# Patient Record
Sex: Male | Born: 1965
Health system: Southern US, Community
[De-identification: ages and names within clinical notes are randomized; demographics above are authoritative.]

## PROBLEM LIST (undated history)

## (undated) DIAGNOSIS — I25118 Atherosclerotic heart disease of native coronary artery with other forms of angina pectoris: Secondary | ICD-10-CM

## (undated) DIAGNOSIS — K559 Vascular disorder of intestine, unspecified: Secondary | ICD-10-CM

## (undated) DIAGNOSIS — G40909 Epilepsy, unspecified, not intractable, without status epilepticus: Secondary | ICD-10-CM

## (undated) DIAGNOSIS — E785 Hyperlipidemia, unspecified: Secondary | ICD-10-CM

## (undated) DIAGNOSIS — K219 Gastro-esophageal reflux disease without esophagitis: Secondary | ICD-10-CM

## (undated) DIAGNOSIS — I10 Essential (primary) hypertension: Secondary | ICD-10-CM

## (undated) DIAGNOSIS — F17200 Nicotine dependence, unspecified, uncomplicated: Secondary | ICD-10-CM

## (undated) DIAGNOSIS — IMO0002 Reserved for concepts with insufficient information to code with codable children: Secondary | ICD-10-CM

## (undated) DIAGNOSIS — N400 Enlarged prostate without lower urinary tract symptoms: Secondary | ICD-10-CM

## (undated) DIAGNOSIS — G4730 Sleep apnea, unspecified: Secondary | ICD-10-CM

## (undated) DIAGNOSIS — M1990 Unspecified osteoarthritis, unspecified site: Secondary | ICD-10-CM

## (undated) DIAGNOSIS — R002 Palpitations: Secondary | ICD-10-CM

## (undated) DIAGNOSIS — G4733 Obstructive sleep apnea (adult) (pediatric): Secondary | ICD-10-CM

## (undated) DIAGNOSIS — R112 Nausea with vomiting, unspecified: Secondary | ICD-10-CM

## (undated) DIAGNOSIS — R079 Chest pain, unspecified: Secondary | ICD-10-CM

## (undated) DIAGNOSIS — I493 Ventricular premature depolarization: Secondary | ICD-10-CM

## (undated) DIAGNOSIS — R0683 Snoring: Secondary | ICD-10-CM

## (undated) DIAGNOSIS — I2510 Atherosclerotic heart disease of native coronary artery without angina pectoris: Secondary | ICD-10-CM

## (undated) DIAGNOSIS — N419 Inflammatory disease of prostate, unspecified: Secondary | ICD-10-CM

## (undated) DIAGNOSIS — I509 Heart failure, unspecified: Secondary | ICD-10-CM

## (undated) DIAGNOSIS — F419 Anxiety disorder, unspecified: Secondary | ICD-10-CM

## (undated) DIAGNOSIS — M549 Dorsalgia, unspecified: Secondary | ICD-10-CM

## (undated) DIAGNOSIS — R569 Unspecified convulsions: Secondary | ICD-10-CM

## (undated) DIAGNOSIS — F32A Depression, unspecified: Secondary | ICD-10-CM

## (undated) HISTORY — PX: COLONOSCOPY: SHX174

## (undated) HISTORY — PX: ELBOW SURGERY: SHX618

## (undated) HISTORY — PX: ANKLE SURGERY: SHX546

## (undated) HISTORY — DX: Vascular disorder of intestine, unspecified: K55.9

## (undated) HISTORY — PX: STENT PLACEMENT VASCULAR (ARMC HX): HXRAD1737

## (undated) HISTORY — DX: Anxiety disorder, unspecified: F41.9

## (undated) HISTORY — DX: Palpitations: R00.2

## (undated) HISTORY — DX: Inflammatory disease of prostate, unspecified: N41.9

## (undated) HISTORY — DX: Benign prostatic hyperplasia without lower urinary tract symptoms: N40.0

## (undated) HISTORY — DX: Ventricular premature depolarization: I49.3

## (undated) HISTORY — DX: Nicotine dependence, unspecified, uncomplicated: F17.200

## (undated) HISTORY — DX: Snoring: R06.83

## (undated) HISTORY — DX: Depression, unspecified: F32.A

## (undated) HISTORY — DX: Chest pain, unspecified: R07.9

## (undated) HISTORY — DX: Gastro-esophageal reflux disease without esophagitis: K21.9

## (undated) HISTORY — DX: Dorsalgia, unspecified: M54.9

## (undated) HISTORY — DX: Atherosclerotic heart disease of native coronary artery without angina pectoris: I25.10

## (undated) HISTORY — DX: Reserved for concepts with insufficient information to code with codable children: IMO0002

---
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
## 1999-06-17 DIAGNOSIS — S060X9A Concussion with loss of consciousness of unspecified duration, initial encounter: Secondary | ICD-10-CM

## 1999-06-17 HISTORY — DX: Concussion with loss of consciousness of unspecified duration, initial encounter: S06.0X9A

## 2008-03-06 DIAGNOSIS — N400 Enlarged prostate without lower urinary tract symptoms: Secondary | ICD-10-CM | POA: Insufficient documentation

## 2008-03-06 DIAGNOSIS — M4316 Spondylolisthesis, lumbar region: Secondary | ICD-10-CM | POA: Insufficient documentation

## 2008-03-06 DIAGNOSIS — F411 Generalized anxiety disorder: Secondary | ICD-10-CM | POA: Insufficient documentation

## 2008-03-06 DIAGNOSIS — F3289 Other specified depressive episodes: Secondary | ICD-10-CM | POA: Insufficient documentation

## 2008-04-19 ENCOUNTER — Encounter: Payer: Self-pay | Admitting: Gastroenterology

## 2009-04-17 ENCOUNTER — Ambulatory Visit: Payer: Self-pay | Admitting: Primary Care

## 2009-05-07 ENCOUNTER — Ambulatory Visit: Payer: Self-pay | Admitting: Primary Care

## 2009-09-06 ENCOUNTER — Encounter: Payer: Self-pay | Admitting: Gastroenterology

## 2010-03-12 ENCOUNTER — Ambulatory Visit
Admit: 2010-03-12 | Discharge: 2010-03-12 | Disposition: A | Discharge status: Home / Self Care | Payer: Self-pay | Source: Ambulatory Visit | Attending: Primary Care | Admitting: Primary Care

## 2010-03-12 ENCOUNTER — Ambulatory Visit: Payer: Self-pay | Admitting: Primary Care

## 2010-03-12 LAB — TESTOSTERONE BY IMMUNOASSAY (ADULT MALES OR INDIVIDUALS ON TESTOSTERONE HORMONE THERAPY): Testosterone: 492 ng/dL (ref 249–836)

## 2010-03-12 LAB — HM HIV SCREENING OFFERED

## 2010-03-12 LAB — LIPID PANEL
Chol/HDL Ratio: 6
Cholesterol: 211 mg/dL — AB
HDL: 35 mg/dL
LDL Calculated: 127 mg/dL
Non HDL Cholesterol: 176 mg/dL
Triglycerides: 244 mg/dL — AB

## 2010-03-12 LAB — COMPREHENSIVE METABOLIC PANEL
ALT: 26 U/L (ref 0–50)
AST: 28 U/L (ref 0–50)
Albumin: 5 g/dL (ref 3.5–5.2)
Alk Phos: 84 U/L (ref 40–130)
Anion Gap: 9 (ref 7–16)
Bilirubin,Total: 0.3 mg/dL (ref 0.0–1.2)
CO2: 27 mmol/L (ref 20–28)
Calcium: 9.4 mg/dL (ref 8.6–10.2)
Chloride: 102 mmol/L (ref 96–108)
Creatinine: 0.78 mg/dL (ref 0.67–1.17)
GFR,Black: >59 *
GFR,Caucasian: >59 *
Glucose: 99 mg/dL (ref 74–106)
Lab: 11 mg/dL (ref 6–20)
Potassium: 4.7 mmol/L (ref 3.3–5.1)
Sodium: 138 mmol/L (ref 133–145)
Total Protein: 7.5 g/dL (ref 6.3–7.7)

## 2010-03-17 NOTE — Progress Notes (Signed)
 Reason For Visit   C/O chest congestion, cough with green sputum. MBabcock, LPN.  HPI   [name] returns to the office for followup of his   hypercholesterolemia and depressive disorder as well as with complaints of   a productive cough for the past week and with a reviewed interest in   maximizing his efforts on smoking cessation.     He continues with his usual medications. He notes that he was making some   good progress with the use of the Nicotrol inhaler last fall until he got   distracted by some stressful issues going on in his life and simply   returned to his smoking habit. He would like to reinitiate the Nicotrol at   this time because he believes that it was helping him and he would like to   give it another try. He continues to smoke 1 ppd of cigarettes.     Along with his recent cough he has had a stuffy nose, runny nose, maxillary   sinus pressure, postnasal drip, scratchy throat, mild wheezing, a little   shortness of breath, loose stools and some sweats. He denies any headaches,   dizziness, ear symptoms, frontal sinus symptoms, heartburn, nausea,   vomiting, fevers or chills. He has taken Mucinex and guaifenesin for his   symptoms with some temporary benefit.     He is doing his best to watch his eating habits and stay active although he   does not have a regular exercise routine.     His mood remains good with the use of the sertraline and he is satisfied to   continue the same. He is sleeping well, his energy level is good, his   appetite is good and he is finding enjoyment in his life. He denies any   problems with concentration, coordination, feelings of guilt or any   suicidal or homicidal ideation.  Allergies   Latex  No Known Drug Allergy.  Current Meds   ** Medication reconciliation completed and patient declined printed list.   **.  Nicotrol 10 MG Inhaler;May use as much as 2 inhalers per day; Rx  ProAir HFA 108 (90 Base) MCG/ACT Aerosol Solution;INHALE 1-2 PUFFS EVERY   4-6 HOURS AS  NEEDED AND AS DIRECTED.; Rx  Sertraline HCl 100 MG Tablet;TAKE 1 TABLET BY MOUTH ONCE DAILY; Rx.  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  Personal Hx   No Alcohol Use  Caffeine Use; 8-10 X A DAY  No Drug Use  Smoking (V15.82); 1PPD SINCE 1987.  Vital Signs   Recorded by [facility] on 12 Mar 2010 09:25 AM  BP:124/80,   HR: 75 b/min,   Temp: 98.3 F,   Height: 71.75 in, Weight: 177 lb, BMI: 24.2 kg/m2,   O2 Sat: 97 (%SpO2).  Physical Exam   General: Alert, appropriate, pleasant man in no apparent distress.  HEENT: Sclera and conjunctiva clear, TMs WNL. Nasal mucosa with mild   erythema, edema and mucous congestion. Mild peritonsillar erythema but no   tonsillar enlargement or exudates. Mild bilateral maxillary sinus   tenderness. No frontal sinus tenderness.  Neck: Supple, no lymphadenopathy, no thyromegaly, 2+ carotid pulses   bilaterally.  Heart: Regular rate and rhythm, no murmur.  Lungs: Coarse upper airway sounds are heard centrally with mild rhonchi. No   wheezes or rales.  Abdomen: Positive bowel sounds, soft, nontender, nondistended, no masses,  no organomegaly.  Extremities: 2+ radial and 2+ posterior tibial pulses bilaterally.  No   clubbing, cyanosis or edema.  Psych: Neurovegetative signs and symptoms as described above.  Results   Blood work obtained on 04/21/09 identified a fasting glucose of 93,   creatinine 0.6, electrolytes and LFTs WNL. Total cholesterol 257, HDL 37,   LDL 191 and triglycerides 811.  Assessment   1. BRONCHITIS  2. HYPERCHOLESTEROLEMIA -- untreated and uncontrolled  --According to ATP III guidelines      LDL above goal ; discussed goal with patient. Based on risk profile and   co-morbidities LDL goal is 100.      HDL below goal; discussed goal with patient. Based on risk profile and   co-morbidities HDL goal is 40.      Triglyceride at goal. ; discussed goal with patient.  Based on risk   profile and co-morbidities Triglyceride goal is 150.  Plan to reach goal includes:  --Lifestyle Modifications: weight reduction; discussed low cholesterol and   saturated fat diet; discussed low carbohydrate diet; discussed aerobic   physical activity       --Following our conversation the patient is willing to make necessary   changes YES       --Self-management tool provided YES   --Medication Management: no changes made ; discussed low fat diet    --Referral for Care Management:  no  --Follow up in 2 weeks  3. SMOKING CESSATION COUNSELING -- patient expresses a renewed desire to   quit  4. DEPRESSIVE DISORDER -- effectively managed.  Plan   1. Initiate amoxicillin 500 mg, 2 tablets twice daily for 10 days.  2. Recommended OTCs, drink adequate fluids, vitamins, rest, salt water   gargles and vaporizer at bedside at night as needed for management of   bronchitis symptoms.  3. Reinitiate Nicotrol, 10 mg/inhaler, may use as much as 2 inhalers per   day.  4. Commended patient on his renewed interest in maximizing his efforts on   smoking cessation and urged him to remain committed to this plan.  5. Refill sertraline 100 mg once daily.  6. Counseled patient regarding prudent diet, exercise and weight   management. Urged him to increase his efforts in this regard.  7. RTO in 2 weeks for followup of hypercholesterolemia or sooner if any   other problems or concerns.  8. Complete blood work prior to the next appointment to check an FLP, CMP   and testosterone level.  Signature   Electronically signed by: Janeal Holmes  M.D.; 03/17/2010 10:33 PM EST.

## 2010-03-26 ENCOUNTER — Ambulatory Visit: Payer: Self-pay | Admitting: Primary Care

## 2010-03-27 ENCOUNTER — Ambulatory Visit: Payer: Self-pay | Admitting: Primary Care

## 2010-04-04 NOTE — Progress Notes (Signed)
 Reason For Visit   F/U and review labs. MBabcock, LPN.  HPI   [name] returns to the office for followup of his   hypercholesterolemia, anxiety disorder and efforts on smoking cessation.     He did reinitiate the Nicotrol inhaler as we had discussed at his last   appointment. He is pleased to report that he has succeeded in reducing his   consumption of cigarettes to 8-10 per day with the use of this medication.   He is pleased with his progress and is hoping that he will be able to end   this habit entirely within the next month or 2.     He otherwise continues with his usual medications. He denies any problems   with headaches, dizziness, vision changes, chest pain, palpitations,   shortness of breath, heartburn, nausea, diarrhea, constipation or fatigue.   He is doing his best to watch his eating habits and stay active but admits   that he tends to be inconsistent with his efforts. He will do well for a   while and then slip back into less productive habits.     His mood remains good with the use of the sertraline. He is sleeping well,   his energy level is good, his appetite is good and he is finding enjoyment   in his life. He denies any problems with concentration, coordination,   feelings of guilt or any suicidal or homicidal ideation. He feels more   relaxed with the use of this medication and is able to manage stress much   more effectively.  Allergies   Latex  No Known Drug Allergy.  Current Meds   ** Medication reconciliation completed and patient declined printed list.   **.  Sertraline HCl 100 MG Tablet;TAKE 1 TABLET BY MOUTH ONCE DAILY; Rx  ProAir HFA 108 (90 Base) MCG/ACT Aerosol Solution;INHALE 1-2 PUFFS EVERY   4-6 HOURS AS NEEDED AND AS DIRECTED.; Rx  Nicotrol 10 MG Inhaler;May use as much as 2 inhalers per day; Rx.  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  Personal Hx   No Alcohol  Use  Caffeine Use; 8-10 X A DAY  No Drug Use  Smoking (V15.82); 1PPD SINCE 1987.  Vital Signs   Recorded by Neomia Dear on 27 Mar 2010 04:16 PM  BP:104/72,   HR: 76 b/min,   Weight: 182 lb,   O2 Sat: 96 (%SpO2).  Physical Exam   General: Alert, appropriate, pleasant man in no apparent distress.  Neck: Supple, no lymphadenopathy, no thyromegaly, 2+ carotid pulses   bilaterally.  Heart: Regular rate and rhythm, no murmur.  Lungs: Clear to auscultation bilaterally.  Abdomen: Positive bowel sounds, soft, nontender, nondistended, no masses,   no organomegaly.  Extremities: 2+ radial and 2+ posterior tibial pulses bilaterally.  No   clubbing, cyanosis or edema.  Psych: Neurovegetative signs and symptoms as described above.  Results   COMPREHENSIVE METABOLIC PROF - CMP   12 Mar 2010 10:32 AM  -   SODIUM: 138 mmol/L  -   POTASSIUM: 4.7 mmol/L  -   CHLORIDE: 102 mmol/L  -   CO2: 27 mmol/L  -   ANION GAP: 9   -   UREA NITROGEN: 11 mg/dL  -   CREATININE: 0.98 mg/dL  -   GFR,CAUCASIAN: > 59  -   GFR,BLACK: > 59  -   GLUCOSE: 99 mg/dL  -  CALCIUM: 9.4 mg/dL  -   TOTAL PROTEIN: 7.5 g/dl  -   ALBUMIN: 5.0 g/dl  -   ALKALINE PHOSPHATASE: 84 u/l  -   T BILI: 0.3 mg/dL  -   AST: 28 u/l  -   ALT: 26 u/l  LIPID PROFILE - LIPID   12 Mar 2010 10:32 AM  -   CHOLESTEROL: 211 mg/dL  -   TRIGLYCERIDES: 914 mg/dL  -   HDL: 35 mg/dL  -   LDL (CALC): 782 mg/dL  -   CHOL/HDL RATIO: 6.0  -   NON HDL CHOLESTEROL: 176 mg/dL  TESTOSTERONE - TESTO   12 Mar 2010 10:32 AM  -   TESTOSTERONE: 492 ng/dl.  Assessment   1. Hypercholesterolemia -- not adequately controlled  2. Anxiety disorder -- satisfactory management  3. Smoking cessation counseling -- good progress on reducing cigarette   consumption.  Plan   1. Initiate lovastatin 40 mg once daily.  2. Counseled patient regarding prudent diet, exercise and weight management.  3. Continue with current medications at their current doses.  4. Commended patient on his progress with efforts on smoking  cessation and   urged him to remain committed to this plan.  5. RTO in 3 months for followup of the above problems or sooner if any   other problems or concerns.  6. Complete blood work prior to the next appointment to check an FLP, AST   and ALT.  Signature   Electronically signed by: [name]  M.D.; 04/04/2010 8:34 PM EST.

## 2010-05-20 ENCOUNTER — Ambulatory Visit: Payer: Self-pay | Admitting: Primary Care

## 2010-05-23 NOTE — Progress Notes (Signed)
 Reason For Visit   C/O right elbow injury, pt states that bowling last night he heard a pop.   Pain from elbow up arm and shoulder.   --PAIN: Patient  acknowledges pain in the last week.       --If yes, 0-10 pain rating: 9        --Duration of pain: 1 day        --Aggravating factors: bending, pressure        --Relieving factors: not using arm  MBabcock, LPN.  HPI   [name] presents to the office with complaints of right elbow pain   following an injury that occurred at approximately 8:00 p.m. last evening.     He is currently participating in a bowling league with his wife,   mother-in-law and father-in-law. He was in the middle of his third game   last evening when he suddenly heard and felt a popping sensation in his   right elbow as he threw the ball down the alley. He experienced immediate   pain but there was no swelling. He continued to finish out his game   although his performance was stunted by the lingering pain and he had to   use his right arm more cautiously.     The pain has been present continuously since that time. He has still not   noticed any swelling. He is aware of some mild waxing and waning dullness   and tingling in his right forearm and hand. He denies any pain in his neck,   back, shoulders, left elbow or wrists. He denies any dullness or tingling   in his left forearm or hand. He denies any weakness in his arms or hands.   He has not noticed any further clicking, popping, locking, grinding or   instability with passive range of motion of his right elbow.     Pain is worse with grasping, pulling and lifting with his right hand and   arm. Symptoms improve somewhat with rest. He has tried using cold packs and   Advil with modest temporary benefit.     He has never injured his elbow in the past.  Allergies   Latex  No Known Drug Allergy.  Current Meds   ** Medication reconciliation completed and patient declined printed list.   **.  Sertraline HCl 100 MG Tablet;TAKE 1 TABLET BY MOUTH  ONCE DAILY; Rx  ProAir HFA 108 (90 Base) MCG/ACT Aerosol Solution;INHALE 1-2 PUFFS EVERY   4-6 HOURS AS NEEDED AND AS DIRECTED.; Rx  Nicotrol 10 MG Inhaler;May use as much as 2 inhalers per day; Rx  Lovastatin 40 MG Tablet;TAKE 1 TABLET DAILY.; Rx.  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  Personal Hx   No Alcohol Use  Caffeine Use; 8-10 X A DAY  No Drug Use  Smoking (V15.82); 1PPD SINCE 1987.  Vital Signs   Recorded by Neomia Dear on 20 May 2010 03:58 PM  BP:112/80,   HR: 77 b/min,   O2 Sat: 96 (%SpO2).  Physical Exam   General: Alert, appropriate, pleasant man in no apparent distress.  Neck: Supple, no lymphadenopathy, no thyromegaly, 2+ carotid pulses   bilaterally.  Heart: Regular rate and rhythm, no murmur.  Lungs: Clear to auscultation bilaterally.  Abdomen: Positive bowel sounds, soft, nontender, nondistended, no masses,   no organomegaly.  Extremities: 2+ radial and 2+ posterior tibial pulses bilaterally.  No  clubbing, cyanosis or edema.  MSK: Patient has mild tenderness on the anterior and dorsal surfaces of the   right elbow. There is tenderness surrounding the olecranon as well as in   the antecubital fossa. Mild discomfort with range of motion of the right   elbow but no crepitance or instability. There is no evidence of erythema,   edema, induration, warmth or ecchymosis.  Neuro: Patient reports dull sensation along the ulnar surface of the right   forearm as well as the 4th and 5th digits of the right hand. No sensory   deficits of the upper extremities are identified otherwise. No motor   deficits. Brisk, uniform biceps and brachioradialis tendon reflexes   bilaterally. 5/5 muscle strength in the flexors and extensors of the   shoulders, elbows, wrists and hands bilaterally.  Assessment   Right elbow sprain.  Plan   1. Refer for stat x-rays of the right elbow to assess for evidence of    fracture or dislocation. Will notify patient of the results later today and   advise him appropriately.  2. Recommended heat, cold, rest and a soft elbow support as needed for   management of symptoms.  3. Avoid stress or strain to the right elbow while symptoms persist.  4. RTO as needed if symptoms persist or worsen.  Signature   Electronically signed by: [name]  M.D.; 05/23/2010 10:03 PM EST.

## 2010-06-16 DIAGNOSIS — I469 Cardiac arrest, cause unspecified: Secondary | ICD-10-CM

## 2010-06-16 HISTORY — DX: Cardiac arrest, cause unspecified: I46.9

## 2010-06-19 ENCOUNTER — Ambulatory Visit: Payer: Self-pay | Admitting: Primary Care

## 2010-06-24 NOTE — Miscellaneous (Unsigned)
 Continuity of Care Record  Created: todo  From: EMERSON, DREW  From:   From: [employer], EHR v10.2.7.53  To: Finis Bud  Purpose: Patient Use;       Problems  Diagnosis: Anxiety Disorder NOS (300.00)   Diagnosis: Benign Prostatic Hypertrophy (600.00)   Diagnosis: Depression (311)   Diagnosis: Discogenic Syndrome (722.2)     Social History  Caffeine Use  Smoking (V15.82)   No History of Alcohol Use  No History of Drug Use    Alerts  Allergy - Latex   Allergy - No Known Drug Allergy     Medications  ALPRAZolam 1 MG Tablet; Take tab 1 hour prior to test ; Rx   Lovastatin 40 MG Tablet; TAKE 1 TABLET DAILY. ; Rx   Nicotrol 10 MG Inhaler; May use as much as 2 inhalers per day ; Rx   ProAir HFA 108 (90 Base) MCG/ACT Aerosol Solution; INHALE 1-2 PUFFS EVERY   4-6 HOURS AS NEEDED AND AS DIRECTED. ; Rx   Sertraline HCl 100 MG Tablet; TAKE 1 TABLET DAILY. ; Rx     Immunizations  Td   Influenza   Influenza   H1N1 Influenza Inj   Influenza   Influenza

## 2010-06-29 ENCOUNTER — Ambulatory Visit
Admit: 2010-06-29 | Discharge: 2010-06-29 | Disposition: A | Discharge status: Home / Self Care | Payer: Self-pay | Source: Ambulatory Visit | Attending: Primary Care | Admitting: Primary Care

## 2010-06-29 LAB — LIPID PANEL
Chol/HDL Ratio: 3.5
Cholesterol: 144 mg/dL
HDL: 41 mg/dL
LDL Calculated: 85 mg/dL
Non HDL Cholesterol: 103 mg/dL
Triglycerides: 90 mg/dL

## 2010-06-29 LAB — ALT: ALT: 27 U/L (ref 0–50)

## 2010-06-29 LAB — AST: AST: 26 U/L (ref 0–50)

## 2010-07-01 ENCOUNTER — Encounter: Payer: Self-pay | Admitting: Primary Care

## 2010-07-01 ENCOUNTER — Ambulatory Visit: Payer: Self-pay | Admitting: Primary Care

## 2010-07-04 NOTE — Progress Notes (Signed)
Reason For Visit   F/U cholesterol and review labs. MBabcock, LPN.  HPI   [name] returns to the office for followup of his   hypercholesterolemia as well as with complaints of recurrent low back pain   for the past 4-5 months, burning, dullness and tingling in his left leg for   the past 2-3 months and weakness in both of his legs for the past 2-3   months as well.     He did initiate the lovastatin as we had discussed in October. He otherwise   continues with his usual medications. He denies any problems with   headaches, dizziness, vision changes, chest pain, palpitations, shortness   of breath, heartburn, nausea, diarrhea, constipation or fatigue. He is   doing his best to watch his eating habits and stay active although he does   not have a regular exercise routine. He continues to be a 1/2 ppd smoker.     He has had recurrent problems with low back pain for several years. He   denies any recent strain or injury to his low back. He has not undertaken   any heavy lifting recently and does not have an exercise routine. The pain   in his low back radiates to his left buttock and thigh. He has been aware   of a burning sensation as well as dullness and tingling in his left thigh   and calf. He denies any pain in his hips, knees or ankles.     He has also noticed that his legs have felt a little weak and sluggish   during the past few months. He it is most aware of this when he climbs a   flight of stairs. He cannot recall ever having had symptoms of this nature   in the past. He denies any associated pain, swelling, warmth or   discoloration.  Allergies   Latex  No Known Drug Allergy.  Current Meds   ** Medication reconciliation completed and patient declined printed list.   **.  ProAir HFA 108 (90 Base) MCG/ACT Aerosol Solution;INHALE 1-2 PUFFS EVERY   4-6 HOURS AS NEEDED AND AS DIRECTED.; Rx  Nicotrol 10 MG Inhaler;May use as much as 2 inhalers per day; Rx  Lovastatin 40 MG Tablet;TAKE 1 TABLET DAILY.;  Rx  Sertraline HCl 100 MG Tablet;TAKE 1 TABLET DAILY.; Rx.  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  Vital Signs   Recorded by Neomia Dear on 01 Jul 2010 09:08 AM  BP:118/74,   Weight: 183 lb.  Physical Exam   General: Alert, appropriate, pleasant man in no apparent distress.  Neck: Supple, no lymphadenopathy, no thyromegaly, 2+ carotid pulses   bilaterally.  Heart: Regular rate and rhythm, no murmur.  Lungs: Clear to auscultation bilaterally.  Abdomen: Positive bowel sounds, soft, nontender, nondistended, no masses,   no organomegaly.  Extremities: 2+ radial and 2+ posterior tibial pulses bilaterally.  No   clubbing, cyanosis, erythema, ecchymosis, warmth, pallor or edema.  MSK: Mild tenderness over the perispinous muscles of the lumbar spine, L >   R. Forward flexion of the back to 90 degrees from vertical and extension to   10 degrees. Lateral flexion to 30 degrees and shoulder rotation to 90   degrees bilaterally.  Neuro: Patient reports dull sensation on the anterolateral surfaces of the   left thigh. No sensory or motor deficits are identified in the lower  extremities otherwise. Brisk, uniform patellar tendon and Achilles tendon   reflexes bilaterally. 5/5 muscle strength in the flexors and extensors of   the hips, knees and ankles bilaterally.  Results   LIPID PROFILE - LIPID   29 Jun 2010 09:32 AM  -   CHOLESTEROL: 144 mg/dL  -   TRIGLYCERIDES: 90 mg/dL  -   HDL: 41 mg/dL  -   LDL (CALC): 85 mg/dL  -   CHOL/HDL RATIO: 3.5   -   NON HDL CHOLESTEROL: 103 mg/dL  ALT - ALT   29 Jun 2010 09:32 AM  -   ALT: 27 u/l  AST - AST   29 Jun 2010 09:32 AM  -   AST: 26 u/l.  Assessment   1. Hypercholesterolemia -- well-controlled  2. Chronic low back pain -- recurrent symptoms  3. Left lower extremity radiculopathy  4. Myalgias.  Plan   1. Hold on the use of lovastatin for now given the weakness in the legs. It    is possible that this medication may be the reason for this and/or the low   back pain as well.  2. Refer for x-rays of the lumbar spine to assess for evidence of   degenerative disc disease, spondylosis and spondylolisthesis. Will notify   patient of the results when they are received and advise him appropriately.  3. Refer to pain specialist, Dr. Riccardo Dubin for consideration of facet   injections or an epidural injection to help alleviate the patient's low   back symptoms.  4. May use heat, cold, Tylenol, Advil and gentle stretching exercises as   needed for management of pain.  5. Avoid stress or strain to the low back or legs while symptoms persist.  6. RTO in 3 months for followup of hypercholesterolemia or sooner if any   other problems or concerns.  7. Complete blood work prior to the next appointment to check an FLP, AST   and ALT.  Signature   Electronically signed by: Janeal Holmes  M.D.; 07/04/2010 6:31 PM EST.

## 2010-08-08 ENCOUNTER — Ambulatory Visit: Admit: 2010-08-08 | Payer: Self-pay | Source: Ambulatory Visit | Admitting: Pain Medicine

## 2010-09-17 ENCOUNTER — Ambulatory Visit: Payer: Self-pay | Admitting: Primary Care

## 2010-09-17 ENCOUNTER — Encounter: Payer: Self-pay | Admitting: Primary Care

## 2010-09-17 LAB — COMPREHENSIVE METABOLIC PANEL
ALT: 27 U/L (ref 0–50)
AST: 25 U/L (ref 0–50)
Albumin: 5.4 g/dL — ABNORMAL HIGH (ref 3.5–5.2)
Alk Phos: 73 U/L (ref 40–130)
Anion Gap: 9 (ref 7–16)
Bilirubin,Total: 0.2 mg/dL (ref 0.0–1.2)
CO2: 28 mmol/L (ref 20–28)
Calcium: 9.5 mg/dL (ref 8.6–10.2)
Chloride: 104 mmol/L (ref 96–108)
Creatinine: 0.77 mg/dL (ref 0.67–1.17)
GFR,Black: >59 *
GFR,Caucasian: >59 *
Glucose: 73 mg/dL — ABNORMAL LOW (ref 74–106)
Lab: 15 mg/dL (ref 6–20)
Potassium: 4.4 mmol/L (ref 3.3–5.1)
Sodium: 141 mmol/L (ref 133–145)
Total Protein: 7.5 g/dL (ref 6.3–7.7)

## 2010-09-17 LAB — CBC AND DIFFERENTIAL
Baso # K/uL: 0 10*3/uL (ref 0.0–0.1)
Basophil %: 0.1 % — ABNORMAL LOW (ref 0.2–1.2)
Eos # K/uL: 0.1 10*3/uL (ref 0.0–0.5)
Eosinophil %: 1 % (ref 0.8–7.0)
Hematocrit: 44 % (ref 40–51)
Hemoglobin: 15.2 g/dL (ref 13.7–17.5)
Lymph # K/uL: 1.8 10*3/uL (ref 1.3–3.6)
Lymphocyte %: 25.7 % (ref 21.8–53.1)
MCV: 94 fL — ABNORMAL HIGH (ref 79–92)
Mono # K/uL: 0.6 10*3/uL (ref 0.3–0.8)
Monocyte %: 8.5 % (ref 5.3–12.2)
Neut # K/uL: 4.4 10*3/uL (ref 1.8–5.4)
Platelets: 302 10*3/uL (ref 150–330)
RBC: 4.7 MIL/uL (ref 4.6–6.1)
RDW: 13.4 % (ref 11.6–14.4)
Seg Neut %: 64.7 % (ref 34.0–67.9)
WBC: 6.8 10*3/uL (ref 4.2–9.1)

## 2010-09-17 LAB — SEDIMENTATION RATE, AUTOMATED: Sedimentation Rate: 16 mm/hr — ABNORMAL HIGH (ref 0–15)

## 2010-09-17 LAB — TESTOSTERONE: Testosterone: 528 ng/dL (ref 249–836)

## 2010-09-17 LAB — TIBC
Iron: 85 ug/dL (ref 45–170)
TIBC: 333 ug/dL (ref 250–450)
Transferrin Saturation: 26 % (ref 20–55)

## 2010-09-17 LAB — MAGNESIUM: Magnesium: 1.8 mEq/L (ref 1.3–2.1)

## 2010-09-17 LAB — T4, FREE: Free T4: 1 ng/dL (ref 0.9–1.7)

## 2010-09-17 LAB — TSH: TSH: 3.54 u[IU]/mL (ref 0.27–4.20)

## 2010-09-17 LAB — LACTATE DEHYDROGENASE: LD: 198 U/L (ref 118–225)

## 2010-09-17 LAB — VITAMIN B12: Vitamin B12: 290 pg/mL (ref 211–946)

## 2010-09-18 LAB — LYME IGG/IGM AB: Lyme AB Screen: NEGATIVE

## 2010-09-18 LAB — SYPHILIS SCREEN
Syphilis Screen: NEGATIVE
Syphilis Status: NONREACTIVE

## 2010-09-19 LAB — VITAMIN D
25-OH VIT D2: <4 ng/mL
25-OH VIT D3: 27 ng/mL
25-OH Vit Total: 27 ng/mL — ABNORMAL LOW (ref 30–80)

## 2010-09-24 ENCOUNTER — Other Ambulatory Visit: Payer: Self-pay | Admitting: Primary Care

## 2010-09-30 ENCOUNTER — Ambulatory Visit: Payer: Self-pay | Admitting: Primary Care

## 2010-09-30 NOTE — Progress Notes (Signed)
 Reason For Visit   C/O bilateral arm weakness, tingling sensations to feet and hands, fatigue   for about 2 weeks. MBabcock, LPN.  HPI   [name] presents to the office with complaints of headaches, fatigue   and tingling sensations in his hands and feet that have been waxing and   waning for the past 2 weeks.     He has no idea what may have triggered these symptoms and cannot recall   having had symptoms of this nature in the past. He denies any recent   illness including any dizziness, vision changes, stuffy nose, runny nose,   ear symptoms, sinus symptoms, postnasal drip, sore throat, cough, wheezing,   shortness of breath, heartburn, nausea, diarrhea, constipation, fevers,   chills or sweats.     He has not initiated any new medications within the past couple of months.   He has not eaten anything out of the ordinary wart on any recent traveling.   He denies any personal or family history of neurologic disorders. He denies   any history of STDs or tick bites. He does note problems with erectile   dysfunction during the past year.     He also notes a feeling of weakness in his arms during the past 2 weeks as   well. He does not seem to have the strength that he recalls having had as   recent as a few months ago. He does not seem to have the level of energy   and motivation he had recently either.     He continues with his usual medications.  Allergies   Latex  No Known Drug Allergy.  Current Meds   ** Medication reconciliation completed and patient declined printed list.   **.  Lovastatin 40 MG Tablet;TAKE 1 TABLET DAILY.; Rx  Sertraline HCl 100 MG Tablet;TAKE 1 TABLET DAILY.; Rx  Nicotrol 10 MG Inhaler;May use as much as 2 inhalers per day; Rx  ProAir HFA 108 (90 Base) MCG/ACT Aerosol Solution;INHALE 1-2 PUFFS EVERY   4-6 HOURS AS NEEDED AND AS DIRECTED.; Rx.  Active Problems   Anxiety Disorder NOS (300.00)  Benign Prostatic Hypertrophy (600.00)  Depression (311)  Discogenic Syndrome (722.2); LUMBAR  SPINE.  Personal Hx   No Alcohol Use  Caffeine Use; 8-10 X A DAY  No Drug Use  Smoking (V15.82); 1PPD SINCE 1987.  Vital Signs   Recorded by [facility] on 17 Sep 2010 02:30 PM  BP:122/82,   Weight: 182 lb.  Physical Exam   General: Alert, appropriate, pleasant man in no apparent distress.  HEENT: PERRLA, EOMI, sclera and conjunctiva clear, TMs WNL, MMM, oropharynx   negative. No frontal or maxillary sinus tenderness.  Neck: Supple, no lymphadenopathy, no thyromegaly, 2+ carotid pulses   bilaterally.  Heart: Regular rate and rhythm, no murmur.  Lungs: Clear to auscultation bilaterally.  Abdomen: Positive bowel sounds, soft, nontender, nondistended, no masses,   no organomegaly.  Extremities: 2+ radial and 2+ posterior tibial pulses bilaterally.  No   clubbing, cyanosis or edema.  Neuro: CN II-XII intact.  No sensory or motor deficits.  Brisk, uniform   upper and lower extremity reflexes bilaterally.  5/5 muscle strength in the   flexors and extensors of the shoulders, elbows, hips and knees bilaterally.    Romberg test negative.  Assessment   1. Headaches  2. Paresthesias  3. Fatigue.  Plan   1. Refer for a head MRI to assess for evidence of multiple sclerosis,   lacunar  infarcts or other CNS lesions.  2. Complete blood work to check a CMP, CBC diff, ESR, iron, TIBC, TSH, free   T4, testosterone, vitamin B12, LDH, RPR and Lyme titer.  3. Refer to neurologist, Dr. Knox Saliva for further evaluation and management   of the problems above.  4. Continue with current medications at their current doses.  5. RTO in 1 month for followup of the above problems or sooner if any other   problems or concerns.  Signature   Electronically signed by: [name]  M.D.; 09/30/2010 9:16 PM EST.

## 2010-10-01 ENCOUNTER — Ambulatory Visit
Admit: 2010-10-01 | Discharge: 2010-10-01 | Disposition: A | Discharge status: Home / Self Care | Payer: Self-pay | Source: Ambulatory Visit | Attending: Neurology | Admitting: Neurology

## 2010-10-01 LAB — SEDIMENTATION RATE, AUTOMATED: Sedimentation Rate: 13 mm/hr (ref 0–15)

## 2010-10-01 LAB — CK: CK: 203 U/L — ABNORMAL HIGH (ref 46–171)

## 2010-10-01 LAB — FOLATE: Folate: >20 ng/mL (ref >=4.6)

## 2010-10-01 LAB — VITAMIN B12: Vitamin B12: 313 pg/mL (ref 211–946)

## 2010-10-01 LAB — CRP: CRP: 1 mg/L (ref 0–10)

## 2010-10-02 LAB — RHEUMATOID FACTOR,SCREEN: Rheumatoid Factor: <10 IU/mL

## 2010-10-02 LAB — ANTINUCLEAR ANTIBODY SCREEN: ANA Screen: NEGATIVE

## 2010-10-03 LAB — HEMOGLOBIN A1C: Hemoglobin A1C: 5.8 % (ref 4.0–6.0)

## 2010-10-03 LAB — LYME IGG/IGM AB: Lyme AB Screen: NEGATIVE

## 2010-10-18 ENCOUNTER — Ambulatory Visit: Payer: Self-pay | Admitting: Primary Care

## 2010-12-24 ENCOUNTER — Other Ambulatory Visit: Payer: Self-pay | Admitting: Primary Care

## 2010-12-24 MED ORDER — SERTRALINE HCL 100 MG PO TABS *I*
ORAL_TABLET | ORAL | Status: DC
Start: 2010-12-24 — End: 2011-03-10

## 2010-12-24 NOTE — Telephone Encounter (Signed)
Send to [facility]

## 2011-01-07 ENCOUNTER — Encounter: Payer: Self-pay | Admitting: Gastroenterology

## 2011-02-04 ENCOUNTER — Encounter: Payer: Self-pay | Admitting: Gastroenterology

## 2011-02-19 ENCOUNTER — Ambulatory Visit: Payer: Self-pay | Admitting: Primary Care

## 2011-02-25 ENCOUNTER — Ambulatory Visit: Payer: Self-pay | Admitting: Primary Care

## 2011-02-25 ENCOUNTER — Encounter: Payer: Self-pay | Admitting: Primary Care

## 2011-02-25 VITALS — BP 124/78 | Ht 71.25 in | Wt 182.0 lb

## 2011-02-25 DIAGNOSIS — M545 Low back pain, unspecified: Secondary | ICD-10-CM

## 2011-02-25 DIAGNOSIS — M5416 Radiculopathy, lumbar region: Secondary | ICD-10-CM

## 2011-02-25 MED ORDER — HYDROCODONE-ACETAMINOPHEN 5-325 MG PO TABS *I*
ORAL_TABLET | ORAL | Status: DC
Start: 2011-02-25 — End: 2011-03-25

## 2011-02-25 MED ORDER — CYCLOBENZAPRINE HCL 10 MG PO TABS *I*
10.0000 mg | ORAL_TABLET | Freq: Three times a day (TID) | ORAL | Status: DC | PRN
Start: 2011-02-25 — End: 2011-04-01

## 2011-03-10 ENCOUNTER — Ambulatory Visit: Payer: Self-pay | Admitting: Neurosurgery

## 2011-03-10 ENCOUNTER — Encounter: Payer: Self-pay | Admitting: Neurosurgery

## 2011-03-10 VITALS — BP 118/72 | HR 82 | Resp 12 | Ht 72.0 in | Wt 182.0 lb

## 2011-03-10 DIAGNOSIS — M5136 Other intervertebral disc degeneration, lumbar region: Secondary | ICD-10-CM

## 2011-03-10 NOTE — H&P (Signed)
History of Present Illness: 45 y.o. male with several week history  back pain and radiation into left extremity.  He has a history of tingling and numbness and heaviness in his legs and has seen Dr. Knox Saliva, who ordered imaging.  He stopped lovastatin, but did not have resolution of his symptoms.  He then went on a 15 mile bicycle ride February 24, 2011.   The following day his back was achy, but then he began to have pain radiating into the left leg. There is an associated numbness and tingling in the left leg leg to the toes, especially the great and second toe., he has no right leg pain, but his leg feels heavy.  Aggravating factors include: standing, sitting, walking and running  He gets only slight relief with lying down.  He has not been able to work since February 24, 2011 secondary to the pain.  Conservative treatment has included muscle relaxers and narcotic pain medications.  His back is worse than his legs.      Allergies   Allergen Reactions   . Latex      Created by Conversion - 0;    . No Known Drug Allergy      Created by Conversion - 0;      Current Outpatient Prescriptions   Medication Sig   . HYDROcodone-acetaminophen (NORCO) 5-325 MG per tablet Take 1-2 tablets every 4-6 hours as needed for pain   . cyclobenzaprine (FLEXERIL) 10 MG tablet Take 1 tablet (10 mg total) by mouth 3 times daily as needed for Muscle spasms     . albuterol (PROAIR HFA) 108 (90 BASE) MCG/ACT inhaler Inhale 2 puffs into the lungs every 6 hours as needed       . nicotine (NICOTROL) 10 MG inhaler Inhale 1 puff into the lungs as needed       . sertraline (ZOLOFT) 100 MG tablet Take 100 mg by mouth daily   TAKE 1 TABLET DAILY.      Past Medical History   Diagnosis Date   . Prostatitis      Conversion Data - Jenna Luo     Past Surgical History   Procedure Date   . Carpal tunnel release      Decompression Of Median Nerve At Carpal Tunnel Conversion Data      Social history, family history and review of systems available on  patient intake form.     Physical Exam:  BP 118/72  Pulse 82  Resp 12  Ht 1.829 m (6')  Wt 82.555 kg (182 lb)  BMI 24.68 kg/m2  Well developed.  No acute distress.    Eyes: EOMI, sclera clear.    Neck: Full range of motion. Supple,  Non-tender.     Cardiovascular: Regular rate, rhythm.    Pulmonary: Clear.    Skin: Warm and dry, pink.    Neurological: Alert and oriented to person, place and time.  Speech clear and fluent.  Sensory exam is altered bilaterally, but in no dermatomal pattern. No clonus.  DTRs normal.    Musculoskeletal:  Normal tone.  No atrophy.  Gait steady.  Can heel toe maneuver without difficulty.  Negative straight leg raising.  Negative Patrick's sign.  Poor effort on exam.     HF KF KE DF PF EHL   Right 5 5 5 5 5 5    Left 5 5 5 5 5 5      Imaging:    [facility] lumbar spine [facility] 11[facility]-10  demonstrates mild degenerative changes at L4-5 and L5-S1, no obvious neural compression.    Impression/Plan: 45 y.o. male with low back and left leg pain.  The imaging was personally reviewed today with [name] and his wife.  Although he has disc degeneration there is no obvious neural compression to explain his symptoms.    We do not recommend surgery, but he may benefit from conservative measures to prevent further deconditioning, and  perhaps pain management for symptoms control.  We will not be scheduling additional appointments.  Medical or disability issues are deferred.

## 2011-03-17 DIAGNOSIS — Z951 Presence of aortocoronary bypass graft: Secondary | ICD-10-CM

## 2011-03-17 HISTORY — DX: Presence of aortocoronary bypass graft: Z95.1

## 2011-03-25 ENCOUNTER — Other Ambulatory Visit: Payer: Self-pay | Admitting: Primary Care

## 2011-03-25 ENCOUNTER — Ambulatory Visit: Payer: Self-pay | Admitting: Orthopedic Surgery

## 2011-03-25 DIAGNOSIS — M545 Low back pain, unspecified: Secondary | ICD-10-CM

## 2011-03-25 MED ORDER — HYDROCODONE-ACETAMINOPHEN 5-325 MG PO TABS *I*
ORAL_TABLET | ORAL | Status: DC
Start: 2011-03-25 — End: 2011-04-04

## 2011-03-30 NOTE — Progress Notes (Signed)
S:  [name] presents to the office with complaints of recurrent low back pain for the past 10 days.    He reports that his symptoms began immediately following a bicycle ride with his family.  The pain has been present continuously since that time.  The pain frequently radiates down each of his legs to his feet.  He has also had dullness and tingling in each of his legs, L > R.  His left leg feels weak.  He continues to have dullness and tingling in his hands which remains unchanged from his last visit.  He denies any pain in his hips, knees or ankles.    He had to call off from work today due to his pain.  He has been trying to hang in there at work during the past week but feels that he is unable to function on his job at this point due to the pain.  He has tried using hot packs and ibuprofen for his symptoms with modest temporary benefit.    He has had recurrent problems with low back pain for several years. 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 He has never had any surgeries to his back.    He is already scheduled to see neurosurgeon, Dr. Jamesetta So on 03/25/11 for evaluation of his neck pain and the paresthesias in his extremities that we have discussed previously.  This referral was provided by neurologist, Dr. Knox Saliva.    O:  General: Alert, pleasant, uncomfortable appearing man in no apparent distress.  Neck: Supple, no lymphadenopathy, no thyromegaly, 2+ carotid pulses bilaterally.  Heart: Regular rate and rhythm, no murmur.  Lungs: Clear to auscultation bilaterally.  Abdomen: Positive bowel sounds, soft, nontender, nondistended, no masses, no organomegaly.  Extremities: 2+ radial and 2+ posterior tibial pulses bilaterally.  No clubbing, cyanosis or edema.  MSK: Moderate tenderness over the perispinous muscles of  the lumbar spine bilaterally.  Forward flexion of the back to 70 from vertical extension to 5.  Lateral flexion to 20 shoulder rotation to 80 bilaterally.  Neuro: Patient reports dull sensation on the anterior and lateral surfaces of his thighs and calves as well as the lateral surfaces of each of his feet.  No motor deficits.  Brisk, uniform patellar tendon and Achilles tendon reflexes bilaterally.    A:  1.  Low back pain  2.  Lumbar radiculopathy    P:  1.  Initiate hydrocodone/APAP, 5/325, 1-2 tabs every 4 hours as needed for pain.  2.  Initiate cyclobenzaprine 10 mg 3 times daily as needed for muscle spasms.  3.  May use heat, cold and gentle stretching exercises as needed for management of symptoms.  4.  Avoid lifting greater than 10 pounds at a time as well as any other strenuous physical activity that could put stress on the low back or legs while symptoms persist.  5.  Remain off from work until otherwise advised.  6.  Be sure to discuss the above problem with neurosurgeon, Dr. Jamesetta So at the upcoming appointment.  7.  RTO in 3 weeks for follow up of the above problem or sooner if any worsening problems or concerns.

## 2011-04-01 ENCOUNTER — Encounter: Payer: Self-pay | Admitting: Primary Care

## 2011-04-01 ENCOUNTER — Encounter: Payer: Self-pay | Admitting: Gastroenterology

## 2011-04-01 ENCOUNTER — Ambulatory Visit: Payer: Self-pay | Admitting: Primary Care

## 2011-04-01 ENCOUNTER — Other Ambulatory Visit: Payer: Self-pay | Admitting: Primary Care

## 2011-04-01 VITALS — BP 110/78 | Ht 71.0 in | Wt 183.0 lb

## 2011-04-01 DIAGNOSIS — Z23 Encounter for immunization: Secondary | ICD-10-CM

## 2011-04-01 DIAGNOSIS — M25572 Pain in left ankle and joints of left foot: Secondary | ICD-10-CM

## 2011-04-04 ENCOUNTER — Ambulatory Visit: Payer: Self-pay | Admitting: Family Medicine

## 2011-04-04 ENCOUNTER — Inpatient Hospital Stay
Admit: 2011-04-04 | Disposition: A | Discharge status: Home / Self Care | Payer: Self-pay | Source: Ambulatory Visit | Attending: Cardiology | Admitting: Cardiology

## 2011-04-04 ENCOUNTER — Other Ambulatory Visit: Payer: Self-pay | Admitting: Gastroenterology

## 2011-04-04 ENCOUNTER — Other Ambulatory Visit: Payer: Self-pay | Admitting: Cardiology

## 2011-04-04 ENCOUNTER — Emergency Department: Admission: EM | Admit: 2011-04-04 | Payer: Self-pay | Source: Ambulatory Visit

## 2011-04-04 ENCOUNTER — Encounter: Payer: Self-pay | Admitting: Cardiology

## 2011-04-04 ENCOUNTER — Encounter: Payer: Self-pay | Admitting: Family Medicine

## 2011-04-04 VITALS — BP 110/70 | HR 89 | Ht 70.87 in | Wt 180.8 lb

## 2011-04-04 DIAGNOSIS — I219 Acute myocardial infarction, unspecified: Secondary | ICD-10-CM

## 2011-04-04 DIAGNOSIS — I213 ST elevation (STEMI) myocardial infarction of unspecified site: Principal | ICD-10-CM

## 2011-04-04 HISTORY — DX: ST elevation (STEMI) myocardial infarction of unspecified site: I21.3

## 2011-04-04 LAB — BASIC METABOLIC PANEL
Anion Gap: 11 (ref 7–16)
CO2: 24 mmol/L (ref 20–28)
Calcium: 8.9 mg/dL (ref 8.6–10.2)
Chloride: 104 mmol/L (ref 96–108)
Creatinine: 0.68 mg/dL (ref 0.67–1.17)
GFR,Black: >59 *
GFR,Caucasian: >59 *
Glucose: 108 mg/dL — ABNORMAL HIGH (ref 60–99)
Lab: 15 mg/dL (ref 6–20)
Potassium: 3.9 mmol/L (ref 3.3–5.1)
Sodium: 139 mmol/L (ref 133–145)

## 2011-04-04 LAB — MAGNESIUM: Magnesium: 1.7 mEq/L (ref 1.3–2.1)

## 2011-04-04 LAB — CBC
Hematocrit: 38 % — ABNORMAL LOW (ref 40–51)
Hemoglobin: 12.9 g/dL — ABNORMAL LOW (ref 13.7–17.5)
MCV: 93 fL — ABNORMAL HIGH (ref 79–92)
Platelets: 289 10*3/uL (ref 150–330)
RBC: 4.1 MIL/uL — ABNORMAL LOW (ref 4.6–6.1)
RDW: 13.3 % (ref 11.6–14.4)
WBC: 10.1 10*3/uL — ABNORMAL HIGH (ref 4.2–9.1)

## 2011-04-04 LAB — CK ISOENZYMES
CK: 1351 U/L — ABNORMAL HIGH (ref 46–171)
Mass CKMB: 136.4 ng/mL — ABNORMAL HIGH (ref 0.0–4.9)
Relative Index: 10.1 % — ABNORMAL HIGH (ref 0.0–5.0)

## 2011-04-04 MED ORDER — METOPROLOL TARTRATE 1 MG/ML IV SOLN *I*
INTRAVENOUS | Status: AC
Start: 2011-04-04 — End: 2011-04-04
  Administered 2011-04-04: 5 mg via INTRAVENOUS
  Filled 2011-04-04: qty 5

## 2011-04-04 MED ORDER — CARVEDILOL 3.125 MG PO TABS *I*
3.1250 mg | ORAL_TABLET | Freq: Two times a day (BID) | ORAL | Status: DC
Start: 2011-04-04 — End: 2011-04-04
  Administered 2011-04-04: 3.125 mg via ORAL
  Filled 2011-04-04: qty 1

## 2011-04-04 MED ORDER — MAGNESIUM SULFATE 2GM IN D5W 50ML *I*
2000.0000 mg | Freq: Once | INTRAVENOUS | Status: AC
Start: 2011-04-04 — End: 2011-04-04
  Administered 2011-04-04: 2000 mg via INTRAVENOUS

## 2011-04-04 MED ORDER — PROMETHAZINE HCL 25 MG/ML IJ SOLN *I*
INTRAMUSCULAR | Status: AC
Start: 2011-04-04 — End: 2011-04-04
  Administered 2011-04-04: 12.5 mg via INTRAVENOUS
  Filled 2011-04-04: qty 1

## 2011-04-04 MED ORDER — ASPIRIN 81 MG PO TBEC *I*
81.0000 mg | DELAYED_RELEASE_TABLET | Freq: Every day | ORAL | Status: DC
Start: 2011-04-04 — End: 2011-04-07
  Administered 2011-04-05 – 2011-04-07 (×3): 81 mg via ORAL
  Filled 2011-04-04 (×4): qty 1

## 2011-04-04 MED ORDER — TICAGRELOR 90 MG PO TABS *I*
90.0000 mg | ORAL_TABLET | Freq: Two times a day (BID) | ORAL | Status: DC
Start: 2011-04-04 — End: 2011-04-07
  Administered 2011-04-04 – 2011-04-07 (×6): 90 mg via ORAL
  Filled 2011-04-04 (×7): qty 1

## 2011-04-04 MED ORDER — ACETAMINOPHEN 500 MG PO TABS *I*
1000.0000 mg | ORAL_TABLET | Freq: Three times a day (TID) | ORAL | Status: DC | PRN
Start: 2011-04-04 — End: 2011-04-07
  Administered 2011-04-04: 650 mg via ORAL
  Filled 2011-04-04 (×3): qty 2

## 2011-04-04 MED ORDER — ACETAMINOPHEN 325 MG PO TABS *I*
ORAL_TABLET | ORAL | Status: DC
Start: 2011-04-04 — End: 2011-04-07
  Filled 2011-04-04: qty 2

## 2011-04-04 MED ORDER — DALTEPARIN SODIUM 5000 UNIT/0.2ML SC SOSY *I*
5000.0000 [IU] | PREFILLED_SYRINGE | SUBCUTANEOUS | Status: DC
Start: 2011-04-04 — End: 2011-04-07
  Administered 2011-04-05 – 2011-04-06 (×2): 5000 [IU] via SUBCUTANEOUS
  Filled 2011-04-04 (×4): qty 0.2

## 2011-04-04 MED ORDER — ATORVASTATIN CALCIUM 40 MG PO TABS *I*
40.0000 mg | ORAL_TABLET | Freq: Every evening | ORAL | Status: DC
Start: 2011-04-04 — End: 2011-04-07
  Administered 2011-04-04 – 2011-04-06 (×3): 40 mg via ORAL
  Filled 2011-04-04 (×3): qty 1

## 2011-04-04 MED ORDER — METOPROLOL TARTRATE 12.5 MG PO CAPS *I*
12.5000 mg | ORAL_CAPSULE | Freq: Two times a day (BID) | ORAL | Status: DC
Start: 2011-04-04 — End: 2011-04-05
  Administered 2011-04-04 – 2011-04-05 (×2): 12.5 mg via ORAL
  Filled 2011-04-04 (×3): qty 1

## 2011-04-04 MED ORDER — LISINOPRIL 5 MG PO TABS *I*
5.0000 mg | ORAL_TABLET | Freq: Every day | ORAL | Status: DC
Start: 2011-04-04 — End: 2011-04-07
  Administered 2011-04-04 – 2011-04-07 (×4): 5 mg via ORAL
  Filled 2011-04-04 (×4): qty 1

## 2011-04-04 MED ORDER — METOPROLOL TARTRATE 1 MG/ML IV SOLN *I*
5.0000 mg | Freq: Once | INTRAVENOUS | Status: AC
Start: 2011-04-04 — End: 2011-04-04

## 2011-04-04 MED ORDER — PROMETHAZINE HCL 25 MG/ML IJ SOLN *I*
12.5000 mg | Freq: Four times a day (QID) | INTRAMUSCULAR | Status: DC | PRN
Start: 2011-04-04 — End: 2011-04-07
  Filled 2011-04-04: qty 1

## 2011-04-04 MED ORDER — MAGNESIUM SULFATE 2GM IN D5W 50ML *I*
INTRAVENOUS | Status: DC
Start: 2011-04-04 — End: 2011-04-07
  Filled 2011-04-04: qty 50

## 2011-04-04 MED ORDER — EPTIFIBATIDE 0.75 MG/ML IV SOLN *I*
2.0000 ug/kg/min | INTRAVENOUS | Status: AC
Start: 2011-04-04 — End: 2011-04-05
  Administered 2011-04-04 (×2): 2 ug/kg/min via INTRAVENOUS
  Filled 2011-04-04: qty 100

## 2011-04-04 MED ORDER — CALCIUM GLUCONATE 10 % IV SOLN *I*
4.7000 meq | Freq: Once | INTRAVENOUS | Status: AC
Start: 2011-04-04 — End: 2011-04-05

## 2011-04-04 MED ORDER — SERTRALINE HCL 100 MG PO TABS *I*
100.0000 mg | ORAL_TABLET | Freq: Every day | ORAL | Status: DC
Start: 2011-04-04 — End: 2011-04-07
  Administered 2011-04-04 – 2011-04-06 (×3): 100 mg via ORAL
  Filled 2011-04-04 (×4): qty 1

## 2011-04-04 MED ORDER — CALCIUM GLUCONATE 10 % IV SOLN *I*
INTRAVENOUS | Status: DC
Start: 2011-04-04 — End: 2011-04-07
  Filled 2011-04-04: qty 10

## 2011-04-04 MED ORDER — PROMETHAZINE HCL 25 MG/ML IJ SOLN *I*
12.5000 mg | Freq: Once | INTRAMUSCULAR | Status: AC
Start: 2011-04-05 — End: 2011-04-04
  Administered 2011-04-04: 12.5 mg via INTRAVENOUS

## 2011-04-04 NOTE — Progress Notes (Signed)
Utilization Management    Level of Care Inpatient as of the date 02/02/11      Valene Bors, RN     Pager: [phone]

## 2011-04-04 NOTE — H&P (Signed)
Cardiology H&P for inpatients    Chief Complaint: Chest pain    Cardiac Presentation on Admission (Please check all that apply)  ST-Elevation MI (STEMI)    HPI Comments: Symptoms began this morning and prompted him to go to Dr. Efrain Sella office where ECG demosntrated lateral MI. EMS called and he was taken directly to cath lab where angio revealed occlusion of the LAD/Diagonal that was successfully treated with PCI    Chest Pain   This is a new problem. The current episode started today. The onset quality is sudden. The problem has been resolved. The pain is present in the substernal region. The pain is at a severity of 8/10. The pain is severe. The quality of the pain is described as crushing and dull. Associated symptoms include diaphoresis and shortness of breath.     There is no height on file to calculate BMI.    Immunization History   Administered Date(s) Administered   . H1N1 07/14/2008   . Influenza Split(66yr&up) 04/01/2011   . Influenza Whole 04/16/2006, 03/20/2008, 04/03/2009, 03/08/2010   . Td 06/17/1999     No LMP for male patient.  The CrCl is unknown because both a height and weight (above a minimum accepted value) are required for this calculation.    Past Medical History   Diagnosis Date   . Prostatitis      Conversion Data - Jenna Luo     Past Surgical History   Procedure Date   . Ankle surgery      No family history on file.  History     Social History   . Marital Status: Married     Spouse Name: N/A     Number of Children: N/A   . Years of Education: N/A     Social History Main Topics   . Smoking status: Current Everyday Smoker -- 0.8 packs/day for 20 years     Types: Cigarettes   . Smokeless tobacco: Former Neurosurgeon   . Alcohol Use: 0.0 oz/week     .5 drink(s) per week   . Drug Use: No   . Sexually Active: Not on file     Other Topics Concern   . Not on file     Social History Narrative   . No narrative on file     History   Smoking status   . Current Everyday Smoker -- 0.8 packs/day for 20 years   .  Types: Cigarettes   Smokeless tobacco   . Former Neurosurgeon         Allergies:   Allergies   Allergen Reactions   . Latex      Created by Conversion - 0;    . No Known Drug Allergy      Created by Conversion - 0;        Prescriptions prior to admission   Medication Sig   . cyclobenzaprine (FLEXERIL) 10 MG tablet take 1 tablet by mouth three times a day if needed   . HYDROcodone-acetaminophen (NORCO) 5-325 MG per tablet Take 1-2 tablets every 4-6 hours as needed for pain   . albuterol (PROAIR HFA) 108 (90 BASE) MCG/ACT inhaler Inhale 2 puffs into the lungs every 6 hours as needed       . nicotine (NICOTROL) 10 MG inhaler Inhale 1 puff into the lungs as needed       . sertraline (ZOLOFT) 100 MG tablet Take 100 mg by mouth daily   TAKE 1 TABLET DAILY.  Current Facility-Administered Medications   Medication Dose Route Frequency   . eptifibatide (INTEGRILIN) infusion  2 mcg/kg/min Intravenous Continuous       Review of Systems   Constitution: Positive for diaphoresis.   Cardiovascular: Positive for chest pain.   Respiratory: Positive for shortness of breath.    All other systems reviewed and are negative.        Last Nursing documented pain:        Patient Vitals for the past 24 hrs:   Weight   04/04/11 1322 82.6 kg (182 lb 1.6 oz)            Physical Exam   Constitutional: He is oriented to person, place, and time. He appears well-developed and well-nourished. He appears distressed.   HENT:   Head: Normocephalic.   Neck: Normal range of motion. No JVD present.   Cardiovascular: Normal rate, regular rhythm and normal heart sounds.  Exam reveals no gallop and no friction rub.    No murmur heard.  Pulmonary/Chest: Effort normal and breath sounds normal. No respiratory distress. He has no wheezes. He has no rales.   Abdominal: Soft. Bowel sounds are normal. He exhibits no distension. There is no tenderness. There is no rebound and no guarding.   Musculoskeletal: Normal range of motion. He exhibits no edema.   Neurological: He  is alert and oriented to person, place, and time. He has normal reflexes. No cranial nerve deficit. Coordination normal.   Skin: Skin is warm.   Psychiatric: He has a normal mood and affect. His behavior is normal. Judgment and thought content normal.       Lab Results: All labs in the last 24 hours No results found for this or any previous visit (from the past 24 hour(s)).    Most recent major cardiac studies (including cath, EPS, echo, nuclear):   STUDY FINDINGS:     (1) HX PRESENT ILLNESS: This is 45 yo smoker with psotive family h/o   CAD who presented to the ER with 1-2 hours of severe SSCP. He was noted   to have inferior lat ST elevations and brought to the cath lab   emergently for possible primary PCI.     PROCEDURES PERFORMED: (1) Cath Left Ventriculography, (2) Coronary   Angiography, (3) Coronary Clot Extraction, (4) Coronary Stent and (5)   Left Heart Catheterization. Consent was obtained from the patient.    (2) NON-INVASIVE HEMODYNAMICS: HR = 66 BPM, Pulse Pattern = Regular,   ECG Rhythm = NSR, BP = 112/67 mmHg, Arterial O2 Sat = 99%, Height = 180   cm, Weight = 82.6 Kg, BSA = 2.02 m2 and BMI = 25.49 Kg/m2. Arterial O2   Saturation = 99%.     INVASIVE HEMODYNAMICS:                                BASELINE VALUES                 LV Pressure (mmHg)         83 / 15                         Aortic Pressure (mmHg)     83 / 55 / 68    (3) LEFT VENTRICULOGRAPHY FINDINGS: Overall LV systolic function is   mild-to-moderately reduced. The LVEF is 40%. The anterolateral wall is   hypokinetic and the  apex is hypokinetic. There is no significant mitral   regurgitation detected.     A 38F Jacky catheter and 30 ml ioversol (Optiray 350) administered at 10   ml/sec were used for the left ventriculography procedure.    (4) NATIVE CORONARY ARTERY ANATOMY: The coronary system is right   dominant. There is 1 vessel coronary artery disease.  The specific   findings in the native vessels are:    (1) LEFT MAIN (LM): No  significant stenosis.   (2) LEFT ANTERIOR DESCENDING (LAD): There is severe (occlusive) disease   of the LAD. The mid left anterior descending artery has a severe   (occlusive) thrombus filled stenosis. The second LAD diagonal branch   has a severe (occlusive) thrombus filled stenosis.   (3) CIRCUMFLEX (Cx): No significant stenosis.   (4) RIGHT CORONARY ARTERY (RCA): No significant stenosis.   (5) POSTERIOR DESCENDING CORONARY ARTERY (PDA): No significant   stenosis. The PDA blood supply is from the right coronary artery.       CORONARY ANGIOGRAPHY PROCEDURE NOTES: Arterial access was obtained in   the right radial artery using sterile needle puncture. A 38F EBU 3.75   (Extra-Back-up) catheter was used to engage the left main coronary   artery and a 38F Jacky catheter was used to engage the right coronary   artery (arterial guide wire: 0.035 standard J-wire).    (5) CORONARY ARTERY INTERVENTION NOTE: Two lesions were treated and 2   were successfully dilated. A single 23 mm uncoated stent was deployed.   The overall procedure was considered successful.     Heparin and Integrilin were administered and the ACT was monitored   during the procedure. The Left Main coronary artery was engaged with a   6 Fr EBU 3.75 guiding catheter and the LAD was wired to its distal   vessel with a Choice Floppy wire. Thrombus from the mid LAD was   extracted using the Export catheter.  The mid LAD lesion was primarily   stented with a  3.0/23 mm Vision stent to 12 atm and postdilated with a    3.5/12 mm NC Quantum balloon to 16  atm. Final angiography revealed   normal (TIMI III) flow with no residual stenosis or dissection. The   Choice Floppy wire was then directed down the 100% occluded D2 presumed   to be do to embolization of thrombus. Thrombus from the D2 was   extracted using the Export catheter. Final angiography revealed normal   (TIMI III) flow with no residual stenosis or dissection. The radial   artery sheath was pulled and  hemostasis was achieved with a Terumo TR   band. There were no complications during the procedure and the patient   was transferred to  12-1398 in stable condition.    Lesion Location                     LAD Mid 1            LAD D2 1        Pre-Stenosis                          100%                100%          Max Device Size  3.5 mm                             Max Inflation Press                  18 ATM                             Num Stents                              1                                Stent Length                          23 mm                              Final Stenosis                         0%                  0%                 (6) FINISH-UP SUMMARY: A total of 148 ml of ioversol (Optiray 350) was   administered during the procedure. Fluoroscopy time was 10 min 53 sec.   Vascular access was closed in the lab using terumo tr band. The   estimated blood Loss is < 30 cc. No tissue was removed. Following the   procedure, the patient was discharged to 12-1398.     POST-PROCEDURE VITALS: HR = 63 BPM and BP = 113/60 mmHg.     PROCEDURE EVENTS: No significant procedure-related events.     ACCESS SITE EVENTS: No significant access site-related events.      CONCLUSIONS: One vessel coronary artery disease (LAD). Mildly reduced   left ventricular function. Successful percutaneous coronary   intervention (bare metal stenting) of the left anterior descending   artery. Succesful clot extraction form D2.     ---Electronically signed by---  _________________________  Shon Millet, MD  Associate Professor of Medicine      Radiology impressions (last 3 days):  No results found.      Currently Active/Followed Hospital Problems:  There are no hospital problems to display for this patient.      Assessment: Acute anterolateral MI treated with primary PCI    Plan:   Dual antiplatelet therapy  ACE-I, beta-blockers   Check FLP and start statin    Author: Laverta Baltimore, MD  Note created:  04/04/2011  at: 2:48 PM

## 2011-04-04 NOTE — Progress Notes (Signed)
Pt had several long runs of slow VT (rate 100) lasting from 20 seconds to 4 minutes. Pt felt palpitations, EKG obtained, BP stable 110, CCU team at bedside. 5 IV lopressor administered by R3 resident. 2 gm MG given as well. Pt NSR for the latter part of writers shift, VSS. Only complaint is of N/V which he believes is from dinner. Maalox and phenergan IV given diluted. Pt symptoms resolved. Report given to oncoming shift

## 2011-04-04 NOTE — H&P (Signed)
CCU Service - H&P    Chief Complaint: chest pain    HPI:  45 year old Caucasian male with a history of nicotine dependence and depression who was his usual state of health until 11 AM when he developed acute onset of crushing chest pain associated with diaphoresis and dyspnea.  The patient drove to his PCP (Dr. Deatra Canter) who checked EKG that showed lateral ST elevations.  His chest pain got worse in the office. He describes it as 10/10, crushing, radiating to both shoulder and arms, associated with diaphoresis and dizziness. He had an episode of emesis. The patient was given nitro and asa.  EMS also gave him 4 ASA.  On arrival, the patient continued to have chest pain. He was taken directly to cath lab where angio showed LAD/diagonal occlusion.  The patient was succefully treated with PCI.    Review of Systems:   A complete 12-point review of symptoms was completed and is negative except L ankle pain from strain.    Past Medical and Surgical History:  Past Medical History   Diagnosis Date   . Prostatitis      Conversion Data - Jenna Luo   . Anxiety    . Benign prostatic hypertrophy    . Depression    . Discogenic syndrome    . Nicotine dependence      Past Surgical History   Procedure Date   . Ankle surgery        Medications:  Prescriptions prior to admission   Medication Sig   . sertraline (ZOLOFT) 100 MG tablet Take 100 mg by mouth daily   TAKE 1 TABLET DAILY.      Scheduled Meds:     . aspirin EC  81 mg Oral Daily   . Ticagrelor  90 mg Oral Q12H   . dalteparin  5,000 Units Subcutaneous Q24H   . atorvastatin  40 mg Oral QPM   . lisinopril  5 mg Oral Daily   . carvedilol  3.125 mg Oral BID   . sertraline  100 mg Oral Daily     Continuous Infusions:     . eptifibatide 2 mcg/kg/min (04/04/11 1500)     PRN Meds:.    Allergies:  Allergies   Allergen Reactions   . Latex      Created by Conversion - 0;    . No Known Drug Allergy      Created by Conversion - 0;        Social History:  History     Social History   .  Marital Status: Married     Spouse Name: N/A     Number of Children: N/A   . Years of Education: N/A     Occupational History   . Not on file.     Social History Main Topics   . Smoking status: Current Everyday Smoker -- 0.8 packs/day for 24 years     Types: Cigarettes   . Smokeless tobacco: Former Neurosurgeon   . Alcohol Use: 0.0 oz/week     .5 drink(s) per week   . Drug Use: No   . Sexually Active: Not on file     Other Topics Concern   . Not on file     Social History Narrative   . No narrative on file       Family History:  Family History   Problem Relation Age of Onset   . Diabetes Mother    . High cholesterol Mother    .  Heart disease Father    . Diabetes Father        Physical Exam:  No intake or output data in the 24 hours ending 04/04/11 1542  Filed Vitals:    04/04/11 1322 04/04/11 1525   BP:  111/68   Pulse:  71   Temp:  36.4 C (97.5 F)   TempSrc:  Temporal   Resp:  16   Weight: 82.6 kg (182 lb 1.6 oz)    SpO2:  99%     General: NAD, lying comfortably in bed.   HEENT: MMM, no O/P lesions  Neck: supple, JVP non-elevated  Cardiac: RRR, S1S2, no M/G/R  Lungs: clear to auscultation bilaterally  Abdomen: soft, nontender, nondistended, normal bowel sounds  Extremities: no LEE, good distal pulses  Neuro: grossly intact    Labs:  No new labs.    Chol/HDL Ratio   Date Value Range Status   06/29/2010 3.5   Final        Cholesterol   Date Value Range Status   06/29/2010 144   Final      REFERENCE RANGE:  < 200 Desirable                      200-239 Borderline High                        > 240 High        HDL   Date Value Range Status   06/29/2010 41   Final      REFERENCE RANGE:  < 40 Low                        > 60 High        LDL Calculated   Date Value Range Status   06/29/2010 85   Final      REFERENCE RANGE:  < 100 Optimal                      100-129 Near or above optimal                      130-159 Borderline High                      160-189 High                        > 189 Very High        Triglycerides   Date  Value Range Status   06/29/2010 90   Final      REFERENCE RANGE:  < 150 Normal                      150-199 Borderline High                      200-499 High                        > 500 Very High       ECG: NSR @ 82 bmp, ST elevation in I, aVL, V-4-V6, T wave inversion in III, aVF,     Telemetry: none    Radiology: No results found.    Cardiology Studies:  04/04/11 LHC: One vessel coronary artery disease (LAD). Mildly reduced left ventricular function 40%. Successful  percutaneous coronary intervention (bare metal stenting) of the left anterior descending artery. Succesful clot extraction form D2.     ASSESSMENT:  45 y.o. male with a history of anxiety and nicotine dependence who presented with anterolateral STEMI s/p PCI with LAD with bare metal stent.     PLAN:  1. Anterolateral STEMI  - s/p angiogram with bare metal stent in LAD.  - FLP in AM.  - Telemetry.  - Cont ASA 81 mg daily, ticagrelor 90 mg BID.  - Start lisinopril 5 mg daily and carvedilol 3.125 mg BID    2. Nicotine dependence:   - Spent 15 minutes on smoking cessation. Spoke to the patient and his wife. They are ready to quit smoking.    3. Anxiety: cont sertraline.     F: No IVF  E: daily lytes  N: low sodium diet.     DVT ppx: Fragmin SQ     FULL CODE    Doreen Salvage, MD 4:18 PM 04/04/2011

## 2011-04-04 NOTE — Progress Notes (Signed)
Pt was walk in to office stating he felt terrible and needed to be seen    O. Pt was diaphoretic with complaints of nausea   he preceeded to vomit large quantities of fluid   ekg was taken indicating  Flipped t waves and bradycardia       A. Acute MI    P ambulance with ALS called and pt transported to [facility]    Call from hospital confirming an acute anterior infarct    pt received an immediate LAD  Stent placement with clot reduction and clearing of symptoms , minimal loss of cardiac wall function

## 2011-04-04 NOTE — Provider Consult (Signed)
Subjective:     Mr. Jewitt is a 45 year old male with a smoking history and family history of CAD. He presents with 1-2 hours of substernal chest pain and anterolateral ST elevations in his EKG. He is sent to the cath lab for emergent coronary angiogram and PCI.      Past Medical History   Diagnosis Date   . Prostatitis      Conversion Data - ^Resolved       History   Smoking status   . Current Everyday Smoker -- 0.8 packs/day for 20 years   . Types: Cigarettes   Smokeless tobacco   . Former Neurosurgeon       Allergies:   Allergies   Allergen Reactions   . Latex      Created by Conversion - 0;    . No Known Drug Allergy      Created by Conversion - 0;        Prior to Admission Medications:    (Not in a hospital admission)    Active Hospital Medications:  Current Outpatient Prescriptions   Medication   . cyclobenzaprine (FLEXERIL) 10 MG tablet   . HYDROcodone-acetaminophen (NORCO) 5-325 MG per tablet   . albuterol (PROAIR HFA) 108 (90 BASE) MCG/ACT inhaler   . nicotine (NICOTROL) 10 MG inhaler   . sertraline (ZOLOFT) 100 MG tablet          Objective:     Physical Exam  Vitals:  There were no vitals taken for this visit.    Vitals in last 24 hrs:  No data found.           BMI: There is no height or weight on file to calculate BMI.    Jugular venous pressure: Not elevated    Airway Visibility: soft palate, uvula and posterior pharynx    Breath sounds: clear  Cardiovascular:  normal S1,S2 without murmur, rubs, gallops    Neuro exam: Intact  Lab Review   not applicable  Lab Results   Component Value Date    NA 141 09/17/2010    K 4.4 09/17/2010    CL 104 09/17/2010    CO2 28 09/17/2010    UN 15 09/17/2010    CREAT 0.77 09/17/2010    CA 9.5 09/17/2010    GLU 73* 09/17/2010    WBC 6.8 09/17/2010    HCT 44 09/17/2010    HGB 15.2 09/17/2010    MCV 94* 09/17/2010    PLT 302 09/17/2010    GFRB > 59 09/17/2010    GFRC > 59 09/17/2010            CKD stage: (1=slight, GFR>90 / 2=mild / 3=moderate / 4=severe / 5=end-stage, GFR<15) Not  Applicable    Anesthesiologist's Physical Status rating of the patient: Class III: Severe Systemic Disease    Plan for sedation: Moderate  I am evaluating the patient immediately prior to admission of sedation medication. The plan for sedation remains appropriate.      Assessment:   Anterolateral STEMI     Plan:   Coronary angiogram    Author: Lupita Dawn, MD  as of: 04/04/2011  at: 1:18 PM

## 2011-04-04 NOTE — Consults (Signed)
Writer responded to MI alert. Pt brought directly to cath lab. Writer met with pt's wife upon her arrival to ED and walked her back to cath lab waiting room.    Boris Sharper, LMSW  [phone]

## 2011-04-04 NOTE — ED Notes (Signed)
Straight to cath lab

## 2011-04-05 ENCOUNTER — Other Ambulatory Visit: Payer: Self-pay | Admitting: Gastroenterology

## 2011-04-05 LAB — CBC AND DIFFERENTIAL
Baso # K/uL: 0 10*3/uL (ref 0.0–0.1)
Basophil %: 0.1 % — ABNORMAL LOW (ref 0.2–1.2)
Eos # K/uL: 0 10*3/uL (ref 0.0–0.5)
Eosinophil %: 0.1 % — ABNORMAL LOW (ref 0.8–7.0)
Hematocrit: 38 % — ABNORMAL LOW (ref 40–51)
Hemoglobin: 13.1 g/dL — ABNORMAL LOW (ref 13.7–17.5)
Lymph # K/uL: 1.1 10*3/uL — ABNORMAL LOW (ref 1.3–3.6)
Lymphocyte %: 10.3 % — ABNORMAL LOW (ref 21.8–53.1)
MCV: 93 fL — ABNORMAL HIGH (ref 79–92)
Mono # K/uL: 0.8 10*3/uL (ref 0.3–0.8)
Monocyte %: 7.8 % (ref 5.3–12.2)
Neut # K/uL: 8.6 10*3/uL — ABNORMAL HIGH (ref 1.8–5.4)
Platelets: 284 10*3/uL (ref 150–330)
RBC: 4.1 MIL/uL — ABNORMAL LOW (ref 4.6–6.1)
RDW: 13.4 % (ref 11.6–14.4)
Seg Neut %: 81.7 % — ABNORMAL HIGH (ref 34.0–67.9)
WBC: 10.5 10*3/uL — ABNORMAL HIGH (ref 4.2–9.1)

## 2011-04-05 LAB — BASIC METABOLIC PANEL
Anion Gap: 18 — ABNORMAL HIGH (ref 7–16)
CO2: 19 mmol/L — ABNORMAL LOW (ref 20–28)
Calcium: 8.7 mg/dL (ref 8.6–10.2)
Chloride: 98 mmol/L (ref 96–108)
Creatinine: 0.66 mg/dL — ABNORMAL LOW (ref 0.67–1.17)
GFR,Black: >59 *
GFR,Caucasian: >59 *
Glucose: 126 mg/dL — ABNORMAL HIGH (ref 60–99)
Lab: 12 mg/dL (ref 6–20)
Potassium: 4.2 mmol/L (ref 3.3–5.1)
Sodium: 135 mmol/L (ref 133–145)

## 2011-04-05 LAB — LIPID PANEL
Chol/HDL Ratio: 5.7
Cholesterol: 198 mg/dL
HDL: 35 mg/dL
LDL Calculated: 140 mg/dL
Non HDL Cholesterol: 163 mg/dL
Triglycerides: 115 mg/dL

## 2011-04-05 MED ORDER — PROCHLORPERAZINE 25 MG RE SUPP *I*
10.0000 mg | Freq: Once | RECTAL | Status: AC
Start: 2011-04-05 — End: 2011-04-05
  Administered 2011-04-05: 12.5 mg via RECTAL

## 2011-04-05 MED ORDER — PROCHLORPERAZINE MALEATE 10 MG PO TABS *I*
5.0000 mg | ORAL_TABLET | Freq: Once | ORAL | Status: DC
Start: 2011-04-05 — End: 2011-04-05
  Filled 2011-04-05: qty 1

## 2011-04-05 MED ORDER — ONDANSETRON HCL 2 MG/ML IV SOLN *I*
4.0000 mg | Freq: Once | INTRAMUSCULAR | Status: AC
Start: 2011-04-05 — End: 2011-04-05

## 2011-04-05 MED ORDER — ONDANSETRON HCL 2 MG/ML IV SOLN *I*
4.0000 mg | Freq: Four times a day (QID) | INTRAMUSCULAR | Status: DC | PRN
Start: 2011-04-05 — End: 2011-04-07
  Administered 2011-04-05: 4 mg via INTRAVENOUS
  Filled 2011-04-05: qty 2

## 2011-04-05 MED ORDER — PROCHLORPERAZINE 25 MG RE SUPP *I*
12.5000 mg | Freq: Once | RECTAL | Status: AC
Start: 2011-04-05 — End: 2011-04-05
  Administered 2011-04-05: 12.5 mg via RECTAL

## 2011-04-05 MED ORDER — ONDANSETRON HCL 2 MG/ML IV SOLN *I*
INTRAMUSCULAR | Status: AC
Start: 2011-04-05 — End: 2011-04-05
  Administered 2011-04-05: 4 mg via INTRAVENOUS
  Filled 2011-04-05: qty 2

## 2011-04-05 MED ORDER — METOPROLOL TARTRATE 25 MG PO TABS *I*
25.0000 mg | ORAL_TABLET | Freq: Two times a day (BID) | ORAL | Status: DC
Start: 2011-04-05 — End: 2011-04-07
  Administered 2011-04-05 – 2011-04-07 (×4): 25 mg via ORAL
  Filled 2011-04-05 (×5): qty 1

## 2011-04-05 MED ORDER — LORAZEPAM 2 MG/ML IJ SOLN *I*
1.0000 mg | INTRAMUSCULAR | Status: DC | PRN
Start: 2011-04-05 — End: 2011-04-07
  Administered 2011-04-05: 1 mg via INTRAVENOUS
  Filled 2011-04-05: qty 1

## 2011-04-05 MED ORDER — PROMETHAZINE HCL 25 MG/ML IJ SOLN *I*
25.0000 mg | Freq: Once | INTRAMUSCULAR | Status: AC
Start: 2011-04-05 — End: 2011-04-05
  Administered 2011-04-05: 25 mg via INTRAVENOUS
  Filled 2011-04-05: qty 1

## 2011-04-05 NOTE — Progress Notes (Signed)
Picked up pt approx 2300 from eve shift RN.  Integrelin running per order and turned off this AM approx 0630.  Pt experienced persistent N&V refractory to phenergan, zofran, and compazine.  CCU aware.  IV lorazepam ordered this AM and administered to pt. Pt denies any other discomfort, chest pain, or SOB.  Right wrist cath site soft, non-tender, non-ecchymotic.

## 2011-04-05 NOTE — Progress Notes (Signed)
Pt. had some dry toast for breakfast and started feeling much better with no c/o nausea today. Denies pain. Band-aid placed on R wrist site which is benign. Tele in SR. Plan for d/c in a.m.

## 2011-04-05 NOTE — Progress Notes (Addendum)
Cardiology Service - Progress Note    SUBJECTIVE:  Pt complains of nausea and vomiting this AM. It is currently well controlled on Zofran. Denies chest pain or pressure, palpitations, dyspnea, orthopnea, PND, pre-syncope or syncope.     OBJECTIVE:    Medications:  Scheduled Meds:     . aspirin EC  81 mg Oral Daily   . Ticagrelor  90 mg Oral Q12H   . dalteparin  5,000 Units Subcutaneous Q24H   . atorvastatin  40 mg Oral QPM   . lisinopril  5 mg Oral Daily   . sertraline  100 mg Oral Daily   . acetaminophen       . metoprolol  12.5 mg Oral Q12H SCH   . calcium gluconate  4.7 mEq Intravenous Once   . magnesium sulfate in dextrose         Continuous Infusions:   PRN Meds:.ondansetron, acetaminophen, promethazine    Allergies:  Allergies   Allergen Reactions   . Latex      Created by Conversion - 0;    . No Known Drug Allergy      Created by Conversion - 0;        Physical Exam:    Intake/Output Summary (Last 24 hours) at 04/05/11 0635  Last data filed at 04/05/11 0059   Gross per 24 hour   Intake      0 ml   Output      0 ml   Net      0 ml     Filed Vitals:    04/04/11 1600 04/04/11 1800 04/04/11 1916 04/05/11 0409   BP: 125/69  108/68 125/73   Pulse: 70  95 66   Temp:       TempSrc:       Resp:       Height:  1.8 m (5' 10.87")     Weight:  82 kg (180 lb 12.4 oz)     SpO2:    99%     General: NAD, lying comfortably in bed.   HEENT: MMM, no O/P lesions   Neck: supple, JVP non-elevated   Cardiac: RRR, S1S2, no M/G/R   Lungs: clear to auscultation bilaterally   Abdomen: soft, nontender, nondistended, normal bowel sounds   Extremities: no LEE, good distal pulses   Neuro: grossly intact    Labs:    Lab 04/04/11 1853   WBC 10.1*   HGB 12.9*   HCT 38*   PLT 289       Lab 04/04/11 1853   NA 139   K 3.9   CL 104   CO2 24   CREAT 0.68   ALT --   AST --     No results found for this basename: APTT:3,INR:3,PTT:3 in the last 168 hours  No components found with this basename: CKTOTAL:3,TROPONINI:3,TROPONINT:3,CKMBINDEX:3  Chol/HDL  Ratio   Date Value Range Status   06/29/2010 3.5   Final        Cholesterol   Date Value Range Status   06/29/2010 144   Final      REFERENCE RANGE:  < 200 Desirable                      200-239 Borderline High                        > 240 High        HDL   Date Value  Range Status   06/29/2010 41   Final      REFERENCE RANGE:  < 40 Low                        > 60 High        LDL Calculated   Date Value Range Status   06/29/2010 85   Final      REFERENCE RANGE:  < 100 Optimal                      100-129 Near or above optimal                      130-159 Borderline High                      160-189 High                        > 189 Very High        Triglycerides   Date Value Range Status   06/29/2010 90   Final      REFERENCE RANGE:  < 150 Normal                      150-199 Borderline High                      200-499 High                        > 500 Very High       Telemetry: Multiple episodes of sustained VT.     Radiology: No results found.    Cardiology Studies:  04/04/11 LHC: One vessel coronary artery disease (LAD). Mildly reduced left ventricular function 40%. Successful percutaneous coronary intervention (bare metal stenting) of the left anterior descending artery. Succesful clot extraction form D2.       ASSESSMENT:   45 y.o. male with a history of anxiety and nicotine dependence who presented with anterolateral STEMI s/p PCI with LAD with bare metal stent.     PLAN:   1. Anterolateral STEMI   - s/p angiogram with bare metal stent in LAD.   - FLP pending.    - Telemetry.   - Cont ASA 81 mg daily, ticagrelor 90 mg BID.   - Start lisinopril 5 mg daily  - Stop carvedilol.  - Start metoprolol 12.5 mg BID. Will titrate up if BP allows.      2. Nicotine dependence: smoking cessation done.    3. Anxiety: cont sertraline.   F: No IVF   E: daily lytes   N: low sodium diet.     DVT ppx: Fragmin SQ     FULL CODE    CCU Fellow Addendum:  I saw and evaluated the patient. I agree with the resident's findings and plan of care  as documented above.  Pt symptomatically doing quite well other than persistent nausea with some vomiting over the course of the night.  Had some significant VT post-PCI yest and last evening, but that has calmed down.   Increase metoprolol to 25mg  BID and cont to monitor on tele   Cont ticagrelor, Lipitor, ASA, lisinopril as above.   Check EKG to monitor QTc given all the anti emetics that pt has received.   Rest of plan as detailed by Kyle.  Vornovitsky above.    Kyle Stallion, MD    CCU Attending Addendum  Patient seen and evaluated with [name]- I agree with the above findings and assessment.  Note edited as appropriate.  [name] is a 45 y.o. male who had STEMI yesterday with BMS to LAD.  He has not had recurrent chest discomfort, but had long runs of AIVR on telemetry overnight with nausea.  Both are improved this AM and will increase BB.  Follow QTc with aberrant conduction and anti-emetics.      Kyle Stamps, MD

## 2011-04-06 ENCOUNTER — Other Ambulatory Visit: Payer: Self-pay | Admitting: Gastroenterology

## 2011-04-06 LAB — CBC AND DIFFERENTIAL
Baso # K/uL: 0 10*3/uL (ref 0.0–0.1)
Basophil %: 0.4 % (ref 0.2–1.2)
Eos # K/uL: 0 10*3/uL (ref 0.0–0.5)
Eosinophil %: 0.5 % — ABNORMAL LOW (ref 0.8–7.0)
Hematocrit: 41 % (ref 40–51)
Hemoglobin: 13.7 g/dL (ref 13.7–17.5)
Lymph # K/uL: 2.4 10*3/uL (ref 1.3–3.6)
Lymphocyte %: 30.1 % (ref 21.8–53.1)
MCV: 95 fL — ABNORMAL HIGH (ref 79–92)
Mono # K/uL: 0.8 10*3/uL (ref 0.3–0.8)
Monocyte %: 10.2 % (ref 5.3–12.2)
Neut # K/uL: 4.7 10*3/uL (ref 1.8–5.4)
Platelets: 275 10*3/uL (ref 150–330)
RBC: 4.3 MIL/uL — ABNORMAL LOW (ref 4.6–6.1)
RDW: 13.4 % (ref 11.6–14.4)
Seg Neut %: 58.8 % (ref 34.0–67.9)
WBC: 7.9 10*3/uL (ref 4.2–9.1)

## 2011-04-06 LAB — BASIC METABOLIC PANEL
Anion Gap: 13 (ref 7–16)
CO2: 24 mmol/L (ref 20–28)
Calcium: 9.1 mg/dL (ref 8.6–10.2)
Chloride: 104 mmol/L (ref 96–108)
Creatinine: 0.77 mg/dL (ref 0.67–1.17)
GFR,Black: >59 *
GFR,Caucasian: >59 *
Glucose: 119 mg/dL — ABNORMAL HIGH (ref 60–99)
Lab: 12 mg/dL (ref 6–20)
Potassium: 3.9 mmol/L (ref 3.3–5.1)
Sodium: 141 mmol/L (ref 133–145)

## 2011-04-06 LAB — MAGNESIUM: Magnesium: 1.9 mEq/L (ref 1.3–2.1)

## 2011-04-06 NOTE — Progress Notes (Addendum)
Cardiology Service - Progress Note    SUBJECTIVE:    Went into NSVT though asymptomatic. No acute issues overnight.     OBJECTIVE:    Medications:  Scheduled Meds:       . metoprolol  25 mg Oral Q12H SCH   . aspirin EC  81 mg Oral Daily   . Ticagrelor  90 mg Oral Q12H   . dalteparin  5,000 Units Subcutaneous Q24H   . atorvastatin  40 mg Oral QPM   . lisinopril  5 mg Oral Daily   . sertraline  100 mg Oral Daily   . acetaminophen       . magnesium sulfate in dextrose         Continuous Infusions:   PRN Meds:.ondansetron, lorazepam, acetaminophen, promethazine    Allergies:  Allergies   Allergen Reactions   . Latex      Created by Conversion - 0;    . No Known Drug Allergy      Created by Conversion - 0;        Physical Exam:    Intake/Output Summary (Last 24 hours) at 04/06/11 0615  Last data filed at 04/05/11 1500   Gross per 24 hour   Intake    360 ml   Output      0 ml   Net    360 ml     Filed Vitals:    04/05/11 2041 04/05/11 2252 04/06/11 0046 04/06/11 0432   BP: 102/70 97/54 106/64 109/59   Pulse: 74 68 60 74   Temp:  36.7 C (98 F)     TempSrc:  Temporal     Resp:  18     Height:       Weight:       SpO2:  99% 98% 98%     General: NAD, lying comfortably in bed.   HEENT: MMM, no O/P lesions   Neck: supple, JVP non-elevated   Cardiac: RRR, S1S2, no M/G/R   Lungs: clear to auscultation bilaterally   Abdomen: soft, nontender, nondistended, normal bowel sounds   Extremities: no LEE, good distal pulses   Neuro: grossly intact    Labs:    Lab 04/06/11 0058 04/05/11 0643 04/04/11 1853   WBC 7.9 10.5* 10.1*   HGB 13.7 13.1* 12.9*   HCT 41 38* 38*   PLT 275 284 289       Lab 04/06/11 0058 04/05/11 0643 04/04/11 1853   NA 141 135 139   K 3.9 4.2 3.9   CL 104 98 104   CO2 24 19* 24   CREAT 0.77 0.66* 0.68   ALT -- -- --   AST -- -- --     No results found for this basename: APTT:3,INR:3,PTT:3 in the last 168 hours  No components found with this basename: CKTOTAL:3,TROPONINI:3,TROPONINT:3,CKMBINDEX:3  Chol/HDL Ratio    Date Value Range Status   04/05/2011 5.7   Final        Cholesterol   Date Value Range Status   04/05/2011 198   Final      REFERENCE RANGE:  < 200 Desirable                      200-239 Borderline High                        > 240 High        HDL   Date Value Range Status  04/05/2011 35   Final      REFERENCE RANGE:  < 40 Low                        > 60 High        LDL Calculated   Date Value Range Status   04/05/2011 140   Final      REFERENCE RANGE:  < 100 Optimal                      100-129 Near or above optimal                      130-159 Borderline High                      160-189 High                        > 189 Very High        Triglycerides   Date Value Range Status   04/05/2011 115   Final      REFERENCE RANGE:  < 150 Normal                      150-199 Borderline High                      200-499 High                        > 500 Very High       Telemetry: 15 seconds NSVT.     Radiology: No results found.    Cardiology Studies:  04/04/11 LHC: One vessel coronary artery disease (LAD). Mildly reduced left ventricular function 40%. Successful percutaneous coronary intervention (bare metal stenting) of the left anterior descending artery. Succesful clot extraction form D2.       ASSESSMENT:   45 y.o. male with a history of anxiety and nicotine dependence who presented with anterolateral STEMI s/p PCI with LAD with bare metal stent.     PLAN:   1. Anterolateral STEMI. BPs  109-120's. Adequately B blocked.  - s/p angiogram with bare metal stent in LAD.   - LDL at 140. Atorvastatin 40mg  qday.  - Telemetry.   - Cont ASA 81 mg daily, ticagrelor 90 mg BID.   - Start lisinopril 5 mg daily  - Stop carvedilol.  -Lopressor 25mg  BID Will titrate up if BP allows.      2. Nicotine dependence: smoking cessation done.    3. Anxiety: cont sertraline.   F: No IVF   E: daily lytes   N: low sodium diet.     DVT ppx: Fragmin SQ     FULL CODE    [name], MD      CCU Attending Addendum  Patient seen and evaluated with  Dr Albertha Ghee- I agree with the above findings and assessment.  Note edited as appropriate.  [name] is a 45 y.o. male who had STEMI yesterday with BMS to LAD and mildly reduced LVEF. He has not had recurrent chest discomfort, but had some NSVT telemetry overnight with nausea. Continue current regimen.  Nausea resolved.  Likely home tomorrow.    [name], MD

## 2011-04-07 LAB — BASIC METABOLIC PANEL
Anion Gap: 13 (ref 7–16)
CO2: 22 mmol/L (ref 20–28)
Calcium: 9 mg/dL (ref 8.6–10.2)
Chloride: 104 mmol/L (ref 96–108)
Creatinine: 0.69 mg/dL (ref 0.67–1.17)
GFR,Black: >59 *
GFR,Caucasian: >59 *
Glucose: 129 mg/dL — ABNORMAL HIGH (ref 60–99)
Lab: 14 mg/dL (ref 6–20)
Potassium: 3.7 mmol/L (ref 3.3–5.1)
Sodium: 139 mmol/L (ref 133–145)

## 2011-04-07 LAB — CBC AND DIFFERENTIAL
Baso # K/uL: 0 10*3/uL (ref 0.0–0.1)
Basophil %: 0.3 % (ref 0.2–1.2)
Eos # K/uL: 0.1 10*3/uL (ref 0.0–0.5)
Eosinophil %: 1.3 % (ref 0.8–7.0)
Hematocrit: 41 % (ref 40–51)
Hemoglobin: 13.8 g/dL (ref 13.7–17.5)
Lymph # K/uL: 2 10*3/uL (ref 1.3–3.6)
Lymphocyte %: 26 % (ref 21.8–53.1)
MCV: 93 fL — ABNORMAL HIGH (ref 79–92)
Mono # K/uL: 0.9 10*3/uL — ABNORMAL HIGH (ref 0.3–0.8)
Monocyte %: 11.6 % (ref 5.3–12.2)
Neut # K/uL: 4.8 10*3/uL (ref 1.8–5.4)
Platelets: 261 10*3/uL (ref 150–330)
RBC: 4.4 MIL/uL — ABNORMAL LOW (ref 4.6–6.1)
RDW: 13 % (ref 11.6–14.4)
Seg Neut %: 60.8 % (ref 34.0–67.9)
WBC: 7.9 10*3/uL (ref 4.2–9.1)

## 2011-04-07 MED ORDER — NITROGLYCERIN 0.4 MG SL SUBL *I*
0.4000 mg | SUBLINGUAL_TABLET | SUBLINGUAL | Status: AC | PRN
Start: 2011-04-07 — End: 2011-05-07

## 2011-04-07 MED ORDER — METOPROLOL TARTRATE 25 MG PO TABS *I*
25.0000 mg | ORAL_TABLET | Freq: Two times a day (BID) | ORAL | Status: DC
Start: 2011-04-07 — End: 2011-05-13

## 2011-04-07 MED ORDER — TICAGRELOR 90 MG PO TABS *I*
90.0000 mg | ORAL_TABLET | Freq: Two times a day (BID) | ORAL | Status: DC
Start: 2011-04-07 — End: 2012-05-09

## 2011-04-07 MED ORDER — ATORVASTATIN CALCIUM 40 MG PO TABS *I*
40.0000 mg | ORAL_TABLET | Freq: Every evening | ORAL | Status: DC
Start: 2011-04-07 — End: 2011-07-16

## 2011-04-07 MED ORDER — ASPIRIN 81 MG PO TBEC *I*
81.0000 mg | DELAYED_RELEASE_TABLET | Freq: Every day | ORAL | Status: DC
Start: 2011-04-07 — End: 2015-08-02

## 2011-04-07 MED ORDER — LISINOPRIL 5 MG PO TABS *I*
5.0000 mg | ORAL_TABLET | Freq: Every day | ORAL | Status: DC
Start: 2011-04-07 — End: 2011-07-16

## 2011-04-07 NOTE — Progress Notes (Addendum)
Cardiology Service - Progress Note    SUBJECTIVE:    No issues overnight. No nausea. Walked around the unit a few times.     OBJECTIVE:    Medications:  Scheduled Meds:       . metoprolol  25 mg Oral Q12H SCH   . aspirin EC  81 mg Oral Daily   . Ticagrelor  90 mg Oral Q12H   . dalteparin  5,000 Units Subcutaneous Q24H   . atorvastatin  40 mg Oral QPM   . lisinopril  5 mg Oral Daily   . sertraline  100 mg Oral Daily   . acetaminophen       . magnesium sulfate in dextrose         Continuous Infusions:   PRN Meds:.ondansetron, lorazepam, acetaminophen, promethazine    Allergies:  Allergies   Allergen Reactions   . Latex      Created by Conversion - 0;    . No Known Drug Allergy      Created by Conversion - 0;        Physical Exam:    Intake/Output Summary (Last 24 hours) at 04/07/11 0552  Last data filed at 04/06/11 2359   Gross per 24 hour   Intake    630 ml   Output      0 ml   Net    630 ml     Filed Vitals:    04/06/11 1613 04/06/11 2021 04/06/11 2300 04/07/11 0343   BP: 113/61 109/52 107/57 106/58   Pulse: 66 67 68    Temp: 36.5 C (97.7 F)  36.7 C (98.1 F)    TempSrc: Temporal  Temporal    Resp: 18  16    Height:       Weight:       SpO2: 97%  100%      General: NAD, lying comfortably in bed.    Cardiac: RRR, S1S2, no M/G/R   Lungs: clear to auscultation bilaterally   Abdomen: soft, nontender, nondistended, normal bowel sounds   Extremities: no LEE, good distal pulses   Neuro: grossly intact    Labs:    Lab 04/07/11 0408 04/06/11 0058 04/05/11 0643   WBC 7.9 7.9 10.5*   HGB 13.8 13.7 13.1*   HCT 41 41 38*   PLT 261 275 284       Lab 04/07/11 0408 04/06/11 0058 04/05/11 0643   NA 139 141 135   K 3.7 3.9 4.2   CL 104 104 98   CO2 22 24 19*   CREAT 0.69 0.77 0.66*   ALT -- -- --   AST -- -- --     No results found for this basename: APTT:3,INR:3,PTT:3 in the last 168 hours  No components found with this basename: CKTOTAL:3,TROPONINI:3,TROPONINT:3,CKMBINDEX:3  Chol/HDL Ratio   Date Value Range Status   04/05/2011  5.7   Final        Cholesterol   Date Value Range Status   04/05/2011 198   Final      REFERENCE RANGE:  < 200 Desirable                      200-239 Borderline High                        > 240 High        HDL   Date Value Range Status   04/05/2011 35  Final      REFERENCE RANGE:  < 40 Low                        > 60 High        LDL Calculated   Date Value Range Status   04/05/2011 140   Final      REFERENCE RANGE:  < 100 Optimal                      100-129 Near or above optimal                      130-159 Borderline High                      160-189 High                        > 189 Very High        Triglycerides   Date Value Range Status   04/05/2011 115   Final      REFERENCE RANGE:  < 150 Normal                      150-199 Borderline High                      200-499 High                        > 500 Very High       Telemetry: NSR     Radiology: No results found.    Cardiology Studies:  04/04/11 LHC: One vessel coronary artery disease (LAD). Mildly reduced left ventricular function 40%. Successful percutaneous coronary intervention (bare metal stenting) of the left anterior descending artery. Succesful clot extraction form D2.       ASSESSMENT:   45 y.o. male with a history of anxiety and nicotine dependence who presented with anterolateral STEMI s/p PCI with LAD with bare metal stent.     PLAN:   1. Anterolateral STEMI. BPs  109-120's. Adequately B blocked.  - s/p angiogram with bare metal stent in LAD.   - LDL at 140. Atorvastatin 40mg  qday.  - Telemetry.   - Cont ASA 81 mg daily, ticagrelor 90 mg BID.   - Cont lisinopril 5 mg daily  -Lopressor 25mg  BID. Beta blocked well.      2. Nicotine dependence: smoking cessation done.    3. Anxiety: cont sertraline.   F: No IVF   E: daily lytes   N: low sodium diet.     DVT ppx: Fragmin SQ     FULL CODE    Kyle Lesches, [name]      CCU Attending Addendum  Patient seen and evaluated with Dr Albertha Ghee- I agree with the above findings and assessment.  Note edited as  appropriate.  [name] is a 45 y.o. male who had STEMI on 10/19 with BMS to LAD and mildly reduced LVEF. He has not had recurrent chest discomfort or NSVT on telemetry. Continue current regimen and will be discharged home today.  I will see him in clinic next week with plans of initiation of cardiac rehabilitation.     Kyle Stamps, [name]

## 2011-04-07 NOTE — Consults (Signed)
Medical Nutrition Therapy: Heart Healthy Diet Education    Spoke with Pt regarding ways to improve diet for heart health.  Provided Pt with Nutrition Therapy to Reduce Cholesterol and Sodium handout, which emphasizes limiting saturated fats, trans fats, cholesterol, and total fat and increasing omega-3 fatty acids and dietary fiber.  Also provided Pt with nutrition label and went over how to read nutrition labels adjusting for serving size.  Pt with good understanding and ready to make dietary changes. All questions answered.  Provided contact information and encouraged Pt to call with any questions.    Glo Herring, RD, [phone]

## 2011-04-07 NOTE — Discharge Instructions (Signed)
Admission Diagnosis   STEMI    Discharge Diagnosis  STEMI (ST elevation myocardial infarction)       Brief Summary of Your Hospital Course:  45 y.o.male admitted after presenting there with chest pain, ruled in for myocardial infarction; underwent cardiac catheterization with angioplasty/ bare metal stent to the left anterior descending, his heart function (ejection fraction) was 40%.  Medical management was optimized.    Diagnosis  Principal Problem:   *STEMI (ST elevation myocardial infarction)  Active Problems:   Anxiety Disorder NOS   Depression      Procedures performed during this hospitalization:  Cardiac cath: BMS to LAD    Recommended diet:  Low sodium, low cholesterol, low fat      Recommended activity:   Walk and use stairs as tolerated.  Do not lift anything >/5lbs x 2-3 weeks. No driving x 1 week.  Do not return to work until cleared by MD.       Wound Care:  See pre-written guidelines after cardiac cath lab procedure     Pain Medication: pre-written nitroglycerine guidelines    If you experience any of these symptoms chest pain, shortness of breath, Fever (temberature greater than of equal to 101.4, redness or drainage from your incision site, contact your physician immediately.  For concerns specific to this hospitalization, especially within the 1st 24 hours of hospital discharge, call [phone] and ask to speak with Dr. Audery Amel.    Patient is encouraged to participate in cardiac rehab program after seen by cardiologist.           CMS Indicators    ACE inhibitor - Y   ARB - n/a on ACE   Beta blocker -  Y  Statin - Y  Aspirin - Y  Plavix/Brillanta- Y  Smoking cessation - Y    Smoking  Smoking can increase your chances of developing chronic health problems or worsen conditions you already have.  If you smoke you should quit. Smoking cessation information has been given to you for your review to help you quit.  Medications to help you quit are available.  Ask your doctor if you would like to receive  these medications.    Smoking Cessation     This document explains the best ways for you to quit smoking and new treatments to help. It lists new medicines that can double or triple your chances of quitting and quitting for good. It also considers ways to avoid relapses and concerns you may have about quitting, including weight gain.      NICOTINE: A POWERFUL ADDICTION  If you have tried to quit smoking, you know how hard it can be. It is hard because nicotine is a very addictive drug. For some people, it can be as addictive as heroin or cocaine. Quitting is hard. Usually people make 2 or 3 tries, or more, before finally being able to quit. Each time you try to quit, you can learn about what helps and what hurts. Quitting takes hard work and a lot of effort, but you can quit smoking.     QUITTING SMOKING IS ONE OF THE MOST IMPORTANT THINGS YOU WILL EVER DO:   You will live longer, feel better and live better.   The impact on your body of quitting smoking is felt almost immediately:  l Within 20 minutes, blood pressure decreases.Pulse returns to its normal level.  l After 8 hours, carbon monoxide levels in the blood return to normal. Oxygen level increases.  l  After 24 hours, chance of heart attack starts to decrease. Breath, hair and body stop smelling like smoke.  l After 48 hours, damaged nerve endings begin to recover. Sense of taste and smell improve.  l After 72 hours, the body is virtually free of nicotine. Bronchial tubes relax, breathing becomes easier.  l After 2-12 weeks, lungs can hold more air. Exercise becomes easier and circulation improves.   Quitting will lower your chance of having a heart attack, stroke, cancer, or lung disease:  l After 1 year, the risk of coronary heart disease is cut in half.  l After 5 years, the risk of stroke falls to the same as a non-smoker.  l After 10 years, the risk of lung cancer is cut in half and the risk of other cancers decreases significantly.  l After 15 years,  the risk of coronary heart disease drops, usually to the level of a non-smoker.   If you are pregnant, quitting smoking will improve your chances of having a healthy baby.   The people you live with, especially your children, will be healthier.   You will have extra money to spend on things other than cigarettes.     FIVE KEYS TO QUITTING  Studies have shown that these five steps will help you quit smoking and quit for good. You have the best chances of quitting if you use them together:   1 .Get ready.  2 .Get support and encouragement.  3 .Learn new skills and behaviors.  4 .Get medicine to reduce your nicotine addiction, and use it correctly.  5 .Be prepared for relapse or difficult situations, and be determined to continue trying to quit, even if you don't succeed at first.     1. GET READY   Set a quit date.   Change your environment.  l Get rid of ALL cigarettes, ashtrays, matches and lighters in your home, car, and place of work.  l Do not let people smoke in your home.   Review your past attempts to quit. Think about what worked and what did not.   Once you quit, do not smoke, NOT EVEN A PUFF!     2. GET SUPPORT AND ENCOURAGEMENT  Studies have shown that you have a better chance of being successful if you have help. You can get support in many ways:   Tell your family, friends, and coworkers that you are going to quit and need their support. Ask them not to smoke around you.   Talk to your caregivers (doctor, dentist, nurse, pharmacist, psychologist, or smoking counselor).   Get individual, group or telephone counseling and support. The more counseling you have, the better your chances are of quitting. Programs are available at [employer] and health centers. Call your local health department for information about programs in your area.   Spiritual beliefs and practices may help some smokers quit.   Quit meters are small computer programs that keep track of quit statistics such as amount of  "quit-time," cigarettes not smoked, and money saved (for example, see PoliceBars.uy).    Many smokers find one or more of the many self-help books available useful in helping them quit and stay off tobacco.     3. LEARN NEW SKILLS AND BEHAVIORS   Try to distract yourself from urges to smoke. Talk to someone, go for a walk, or occupy your time with a task.   When you first try to quit, change your routine. Use a different route to  work. Drink tea instead of coffee. Eat breakfast in a different place.   Do something to reduce your stress. Take a hot bath, exercise or read a book.   Plan something enjoyable to do every day. Reward yourself for not smoking.   Drink a lot of water and other fluids.   Interactive web-based programs, which specialize in helping you quit, are worth exploring.      4. GET MEDICATION AND USE IT CORRECTLY  Medicines can help you stop smoking and decrease the urge to smoke. Combining medicine with the above behavioral methods and support can quadruple your chances of successfully quitting smoking.  The U.S. Food and Drug Administration (FDA) has approved 7 medicines to help you quit smoking. These medicines fall into 3 categories:   Nicotine replacement therapy (delivers nicotine to your body without the negative effects and risks of smoking):  l Nicotine gum - Available over-the-counter.  l Nicotine lozenges - Available over-the-counter.  l Nicotine inhaler - Available by prescription.  l Nicotine nasal spray - Available by prescription.  l Nicotine skin (transdermal) patches - Available by prescription and over-the-counter.   Anti-depressant medicine (helps people abstain from smoking, but the means by which this occurs is unknown):  l Bupropion SR tablets - Available by prescription.   Nicotinic receptor agonist (simulates the effect of nicotine in your brain):   l Varenicline Tartrate tablets - Available by prescription.      Ask your health care provider for advice  about which medicines to use, how to use to use them, and carefully read the information on the package.   Everyone who is trying to quit may benefit from using a medicine. If you are pregnant or trying to become pregnant, nursing an infant, you are under age 47, or smoke fewer than 10 cigarettes per day, talk to your caregiver before taking any nicotine replacement medicines.   You should stop using a nicotine replacement product and call your caregiver if you experience nausea, dizziness, weakness, vomiting, fast or irregular heartbeat, mouth problems with the lozenge or gum, or redness or swelling of the skin around the patch that does not go away.   Do not use any other product containing nicotine while using a nicotine replacement product.   Talk to your caregiver before using these products if you have diabetes, heart disease, asthma, or stomach ulcers, had a recent heart attack, have high blood pressure that is not controlled with medicine, a history of irregular heartbeat or have been prescribed medicine to help you quit smoking.         5. BE PREPARED FOR RELAPSE OR DIFFICULT SITUATIONS   Most relapses occur within the first 3 months after quitting. Do not be discouraged if you start smoking again. Remember, most people try several times before they finally quit.    You may have symptoms of withdrawal because your body is used to nicotine, the addictive substance in cigarettes. You may crave cigarettes, be irritable, feel very hungry, cough often, get headaches, or have difficulty concentrating.    The withdrawal symptoms are only temporary. They are strongest when you first quit, but will go away within 10-14 days.   Here are some difficult situations to watch for:    Alcohol. Avoid drinking alcohol. Drinking lowers your chances of successfully quitting.    Caffeine.  Try to reduce the amount of caffeine you consume, as it also lowers your chances of successfully quitting.   Other smokers. Being  around smoking  can make you want to smoke. Avoid smokers.   Weight gain. Many smokers will gain weight when they quit, usually less than 10 pounds. Eat a healthy diet and stay active. Do not let weight gain distract you from your main goal, quitting smoking. Some quit-smoking medicines may help delay weight gain. You can always lose weight gained after you quit.   Bad mood or depression. There are a lot of ways to improve your mood other than smoking.  If you are having problems with any of these situations, talk to your caregiver.     SPECIAL SITUATIONS OR CONDITIONS  Studies suggest that everyone can quit smoking. Your situation or condition can give you a special reason to quit.   Pregnant women/New mothers: By quitting, you protect your baby's health and your own.   Hospitalized patients: By quitting, you reduce health problems and help healing.   Heart attack patients: By quitting, you reduce your risk of a second heart attack.   Lung, head, and neck cancer patients: By quitting, you reduce your chance of a second cancer.    Parents of children and adolescents: By quitting, you protect your children and adolescents from illnesses caused by second-hand smoke.     QUESTIONS TO THINK ABOUT  Think about the following questions before you try to stop smoking. You may want to talk about your answers with your caregiver.  1.     Why do you want to quit?   2.     If you tried to quit in the past, what helped and what did not?   3.     What will be the most difficult situations for you after you quit? How will you plan to handle them?  4.     Who can help you through the tough times? Your family? Friends? Caregiver?  5.     What pleasures do you get from smoking? What ways can you still get pleasure if you quit?     Here are some questions to ask your caregiver.  1.     How can you help me to be successful at quitting?   2.     What medicine do you think would be best for me and how should I take it?  3.     What  should I do if I need more help?   4.     What is smoking withdrawal like? How can I get information on withdrawal?     QUITTING TAKES HARD WORK AND A LOT OF EFFORT, BUT YOU CAN QUIT SMOKING.  Additional resource on smoking and how to quit:  PoliceBars.uy     FOR MORE INFORMATION  The information was taken from Treating Tobacco Use and Dependence, a U.S. Public Health Service-sponsored Clinical Practice Guideline.      For information about the guideline or to get more copies of this information, call: (667)653-6912, or write:   [employer], [address], [place] [address]  U.S. Department of Health and [employer], Public Health Service     Document Released: 05/27/2001  Document Re-Released: 03/30/2009  [facility] Patient Information 2011 [place], [place].

## 2011-04-07 NOTE — Discharge Summary (Signed)
Discharge Summary       Admit date: 04/04/2011         Discharge date and time: 04/07/11  Admitting Physician: Kyle Baltimore, [name]   Discharge Attending: [name], [name]    Patient: [name] Age: 45 y.o. Date of Birth: 1965/09/21 [id number]    Chief Complaint: Chest pain  Principal Problem: STEMI (ST elevation myocardial infarction)    Details of Admission: as per admission H&P    Discharge Diagnoses:  Active Hospital Problems   Diagnoses   . STEMI (ST elevation myocardial infarction)   . Anxiety Disorder NOS     Created by Conversion       . Depression     Created by Conversion          Resolved Hospital Problems   Diagnoses     Hospital Course (including key diagnostic test results):  45 year old Caucasian male with [name] history of nicotine dependence and depression presented with acute onset of chest pain radiating to both arms and neck. The patient went to his PCP and was found to have lateral MI on EKG.  He was brought to [facility] cath lab where he had angiogram that showed an LAD/diagonal occlusion for which he received an LAD BMS.  The patient was started on ASA,  metoprolol, lisinopril, ticagrelor, and atorvastatin.  The patient had reperfusion NSTV and VT that resolved within 12 hours after the angioplasty.  He was observed in CCU for 48 hours.  He was discharged home with [name] follow up with [name] (cardiology).      The patient was also discussed smoking cessation during the hospitalization and he decided not to restart smoking after the discharge.       [name]   Home Medication Instructions [id number]    Printed on:04/07/11 1326   Medication Information                      sertraline (ZOLOFT) 100 MG tablet  Take 100 mg by mouth daily   TAKE 1 TABLET DAILY.              aspirin 81 MG EC tablet  Take 1 tablet (81 mg total) by mouth daily               atorvastatin (LIPITOR) 40 MG tablet  Take 1 tablet (40 mg total) by mouth every evening               lisinopril (PRINIVIL,ZESTRIL) 5 MG  tablet  Take 1 tablet (5 mg total) by mouth daily               metoprolol (LOPRESSOR) 25 MG tablet  Take 1 tablet (25 mg total) by mouth 2 times daily               Ticagrelor (BRILINTA) 90 MG TABS tablet  Take 1 tablet (90 mg total) by mouth every 12 hours               nitroglycerin (NITROSTAT) 0.4 MG SL tablet  Place 1 tablet (0.4 mg total) under the tongue every 5 minutes as needed for Chest pain   May repeat 2 times then call 911 if pain persists.                 Key Exam Findings at Discharge:    Vitals: Blood pressure 131/56, pulse 65, temperature 36.7 C (98.1 F), temperature source Temporal, resp. rate 16,  height 1.8 m (5' 10.87"), weight 82 kg (180 lb 12.4 oz), SpO2 98.00%.    Admission Weight: Weight: 82.6 kg (182 lb 1.6 oz)  Discharge Weight: Weight: 82 kg (180 lb 12.4 oz)     Client Name: [name]  Client ID (MRN): [id number]  Care Provider (Primary): [name]  Care Provider (Referral): [name]  Care Provider (Service): [name]  Client Date of Birth (Age): Feb 03, 1966 (4.46 years old[age]  Visit Date (Time): 04/04/2011 (12:29 PM)  Visit Service Area/Type: CATH: Left Heart Cath and PTCA  Visit Patient Type: Emergency  Visit Lock Code (Date/Time): [phone] (04/04/2011 at 13:29:01)      ------BODY OF REPORT------    STUDY FINDINGS:     (1) HX PRESENT ILLNESS: This is 45 yo smoker with psotive family h/o   CAD who presented to the ER with 1-2 hours of severe SSCP. He was noted   to have inferior lat ST elevations and brought to the cath lab   emergently for possible primary PCI.     PROCEDURES PERFORMED: (1) Cath Left Ventriculography, (2) Coronary   Angiography, (3) Coronary Clot Extraction, (4) Coronary Stent and (5)   Left Heart Catheterization. Consent was obtained from the patient.    (2) NON-INVASIVE HEMODYNAMICS: HR = 66 BPM, Pulse Pattern = Regular,   ECG Rhythm = NSR, BP = 112/67 mmHg, Arterial O2 Sat = 99%, Height = 180   cm, Weight = 82.6 Kg, BSA = 2.02  m2 and BMI = 25.49 Kg/m2. Arterial O2   Saturation = 99%.     INVASIVE HEMODYNAMICS:     BASELINE VALUES   LV Pressure (mmHg) 83 / 15   Aortic Pressure (mmHg) 83 / 55 / 68    (3) LEFT VENTRICULOGRAPHY FINDINGS: Overall LV systolic function is   mild-to-moderately reduced. The LVEF is 40%. The anterolateral wall is   hypokinetic and the apex is hypokinetic. There is no significant mitral   regurgitation detected.     [name] 29F Jacky catheter and 30 ml ioversol (Optiray 350) administered at 10   ml/sec were used for the left ventriculography procedure.    (4) NATIVE CORONARY ARTERY ANATOMY: The coronary system is right   dominant. There is 1 vessel coronary artery disease. The specific   findings in the native vessels are:    (1) LEFT MAIN (LM): No significant stenosis.   (2) LEFT ANTERIOR DESCENDING (LAD): There is severe (occlusive) disease   of the LAD. The mid left anterior descending artery has [name] severe   (occlusive) thrombus filled stenosis. The second LAD diagonal branch   has [name] severe (occlusive) thrombus filled stenosis.   (3) CIRCUMFLEX (Cx): No significant stenosis.   (4) RIGHT CORONARY ARTERY (RCA): No significant stenosis.   (5) POSTERIOR DESCENDING CORONARY ARTERY (PDA): No significant   stenosis. The PDA blood supply is from the right coronary artery.       CORONARY ANGIOGRAPHY PROCEDURE NOTES: Arterial access was obtained in   the right radial artery using sterile needle puncture. [name] 29F EBU 3.75   (Extra-Back-up) catheter was used to engage the left main coronary   artery and [name] 29F Jacky catheter was used to engage the right coronary   artery (arterial guide wire: 0.035 standard J-wire).    (5) CORONARY ARTERY INTERVENTION NOTE: Two lesions were treated and 2   were successfully dilated. [name] single 23 mm uncoated stent was deployed.   The overall procedure was considered successful.  Heparin and Integrilin were administered and the ACT was monitored   during the procedure. The Left Main coronary artery was  engaged with [name]   6 Fr EBU 3.75 guiding catheter and the LAD was wired to its distal   vessel with [name] Choice Floppy wire. Thrombus from the mid LAD was   extracted using the Export catheter. The mid LAD lesion was primarily   stented with [name] 3.0/23 mm Vision stent to 12 atm and postdilated with [name]   3.5/12 mm NC Quantum balloon to 16 atm. Final angiography revealed   normal (TIMI III) flow with no residual stenosis or dissection. The   Choice Floppy wire was then directed down the 100% occluded D2 presumed   to be do to embolization of thrombus. Thrombus from the D2 was   extracted using the Export catheter. Final angiography revealed normal   (TIMI III) flow with no residual stenosis or dissection. The radial   artery sheath was pulled and hemostasis was achieved with [name] Terumo TR   band. There were no complications during the procedure and the patient   was transferred to 12-1398 in stable condition.    Lesion Location LAD Mid 1 LAD D2 1   Pre-Stenosis 100% 100%   Max Device Size 3.5 mm   Max Inflation Press 18 ATM   Num Stents 1   Stent Length 23 mm   Final Stenosis 0% 0%       (6) FINISH-UP SUMMARY: [name] total of 148 ml of ioversol (Optiray 350) was   administered during the procedure. Fluoroscopy time was 10 min 53 sec.   Vascular access was closed in the lab using terumo tr band. The   estimated blood Loss is < 30 cc. No tissue was removed. Following the   procedure, the patient was discharged to 12-1398.     POST-PROCEDURE VITALS: HR = 63 BPM and BP = 113/60 mmHg.     PROCEDURE EVENTS: No significant procedure-related events.     ACCESS SITE EVENTS: No significant access site-related events.      CONCLUSIONS: One vessel coronary artery disease (LAD). Mildly reduced   left ventricular function. Successful percutaneous coronary   intervention (bare metal stenting) of the left anterior descending   artery. Succesful clot extraction form D2.     ---Electronically signed by---  _________________________  Kyle Millet,  [name]  Associate Professor of Medicine  (present entire service)  OTHER STAFF INVOLVED IN SERVICE: Gwendolyn Lima, CVT (CARDIOVASCULAR   TECHNICIANS), Lupita Dawn, [name] (CATH LAB FELLOWS), Antonieta Iba, RN   (CATH LAB NURSES), Blanca Friend, RN (CATH LAB NURSES).  ------REPORT DISTRIBUTION LIST------    REFERRAL PROVIDER: Laverta [name], PRIMARY CARE     Pending Test Results: none    Consulting Providers: none    Discharged Condition: good    Discharge medications, instructions, and follow-up plans: as per After Visit Summary  Disposition: Home with no services      Signed: Doreen Salvage, [name]  On: 04/07/2011  at: 9:25 AM

## 2011-04-08 ENCOUNTER — Encounter: Payer: Self-pay | Admitting: Primary Care

## 2011-04-08 ENCOUNTER — Ambulatory Visit: Payer: Self-pay | Admitting: Primary Care

## 2011-04-08 VITALS — BP 102/60 | Ht 71.0 in | Wt 182.0 lb

## 2011-04-08 DIAGNOSIS — E7800 Pure hypercholesterolemia, unspecified: Secondary | ICD-10-CM

## 2011-04-08 DIAGNOSIS — I10 Essential (primary) hypertension: Secondary | ICD-10-CM

## 2011-04-08 DIAGNOSIS — I2510 Atherosclerotic heart disease of native coronary artery without angina pectoris: Secondary | ICD-10-CM

## 2011-04-08 LAB — EKG 12-LEAD
P: 50 degrees
QRS: 78 degrees
Rate: 61 {beats}/min
Severity: ABNORMAL
Severity: ABNORMAL
T: 138 degrees

## 2011-04-09 ENCOUNTER — Telehealth: Payer: Self-pay | Admitting: Cardiology

## 2011-04-09 NOTE — Telephone Encounter (Signed)
Message left to call if any issues since discharge.

## 2011-04-10 ENCOUNTER — Other Ambulatory Visit: Payer: Self-pay | Admitting: Gastroenterology

## 2011-04-10 ENCOUNTER — Encounter: Payer: Self-pay | Admitting: Allergy and Immunology

## 2011-04-10 ENCOUNTER — Telehealth: Payer: Self-pay

## 2011-04-10 ENCOUNTER — Inpatient Hospital Stay
Admission: EM | Admit: 2011-04-10 | Disposition: A | Discharge status: Home / Self Care | Payer: Self-pay | Source: Ambulatory Visit | Attending: Cardiology | Admitting: Cardiology

## 2011-04-10 DIAGNOSIS — R079 Chest pain, unspecified: Principal | ICD-10-CM

## 2011-04-10 DIAGNOSIS — I10 Essential (primary) hypertension: Secondary | ICD-10-CM | POA: Diagnosis present

## 2011-04-10 DIAGNOSIS — E785 Hyperlipidemia, unspecified: Secondary | ICD-10-CM | POA: Diagnosis present

## 2011-04-10 HISTORY — DX: Essential (primary) hypertension: I10

## 2011-04-10 LAB — COMPREHENSIVE METABOLIC PANEL
ALT: 37 U/L (ref 0–50)
AST: 26 U/L (ref 0–50)
Albumin: 4.7 g/dL (ref 3.5–5.2)
Alk Phos: 82 U/L (ref 40–130)
Anion Gap: 13 (ref 7–16)
Bilirubin,Total: 0.4 mg/dL (ref 0.0–1.2)
CO2: 21 mmol/L (ref 20–28)
Calcium: 9 mg/dL (ref 8.6–10.2)
Chloride: 101 mmol/L (ref 96–108)
Creatinine: 0.69 mg/dL (ref 0.67–1.17)
GFR,Black: >59 *
GFR,Caucasian: >59 *
Glucose: 94 mg/dL (ref 60–99)
Lab: 22 mg/dL — ABNORMAL HIGH (ref 6–20)
Potassium: 4.7 mmol/L (ref 3.3–5.1)
Sodium: 135 mmol/L (ref 133–145)
Total Protein: 7.3 g/dL (ref 6.3–7.7)

## 2011-04-10 LAB — EKG 12-LEAD
P: 61 degrees
QRS: 11 degrees
QRS: 131 degrees
QRS: 66 degrees
Rate: 82 {beats}/min
Rate: 100 {beats}/min
Rate: 67 {beats}/min
Severity: ABNORMAL
Severity: ABNORMAL
Severity: ABNORMAL
Severity: ABNORMAL
Severity: ABNORMAL
Severity: ABNORMAL
T: 15 degrees
T: -16 degrees
T: 122 degrees

## 2011-04-10 LAB — PROTIME-INR
INR: 1 (ref 0.9–1.1)
Protime: 13 s (ref 11.9–14.7)

## 2011-04-10 LAB — CBC
Hematocrit: 41 % (ref 40–51)
Hemoglobin: 14.1 g/dL (ref 13.7–17.5)
MCV: 92 fL (ref 79–92)
Platelets: 313 10*3/uL (ref 150–330)
RBC: 4.5 MIL/uL — ABNORMAL LOW (ref 4.6–6.1)
RDW: 12.6 % (ref 11.6–14.4)
WBC: 7.8 10*3/uL (ref 4.2–9.1)

## 2011-04-10 LAB — MAGNESIUM: Magnesium: 1.7 mEq/L (ref 1.3–2.1)

## 2011-04-10 LAB — PHOSPHORUS: Phosphorus: 3.5 mg/dL (ref 2.7–4.5)

## 2011-04-10 LAB — TROPONIN T
Troponin T: 0.24 ng/mL — ABNORMAL HIGH (ref 0.00–0.02)
Troponin T: 0.2 ng/mL — ABNORMAL HIGH (ref 0.00–0.02)

## 2011-04-10 LAB — CK ISOENZYMES
CK: 97 U/L (ref 46–171)
Mass CKMB: 2.1 ng/mL (ref 0.0–4.9)

## 2011-04-10 MED ORDER — METOPROLOL TARTRATE 25 MG PO TABS *I*
25.0000 mg | ORAL_TABLET | Freq: Two times a day (BID) | ORAL | Status: DC
Start: 2011-04-10 — End: 2011-04-11
  Administered 2011-04-10: 25 mg via ORAL
  Filled 2011-04-10 (×3): qty 1

## 2011-04-10 MED ORDER — SERTRALINE HCL 100 MG PO TABS *I*
100.0000 mg | ORAL_TABLET | Freq: Every day | ORAL | Status: DC
Start: 2011-04-10 — End: 2011-04-11
  Administered 2011-04-10: 100 mg via ORAL
  Filled 2011-04-10 (×2): qty 1

## 2011-04-10 MED ORDER — LISINOPRIL 5 MG PO TABS *I*
5.0000 mg | ORAL_TABLET | Freq: Every day | ORAL | Status: DC
Start: 2011-04-10 — End: 2011-04-11
  Administered 2011-04-11: 5 mg via ORAL
  Filled 2011-04-10: qty 1

## 2011-04-10 MED ORDER — DALTEPARIN SODIUM 5000 UNIT/0.2ML SC SOSY *I*
5000.0000 [IU] | PREFILLED_SYRINGE | Freq: Every day | SUBCUTANEOUS | Status: DC
Start: 2011-04-10 — End: 2011-04-11
  Administered 2011-04-10: 5000 [IU] via SUBCUTANEOUS
  Filled 2011-04-10: qty 0.2

## 2011-04-10 MED ORDER — TICAGRELOR 90 MG PO TABS *I*
90.0000 mg | ORAL_TABLET | Freq: Two times a day (BID) | ORAL | Status: DC
Start: 2011-04-10 — End: 2011-04-11
  Administered 2011-04-10 – 2011-04-11 (×2): 90 mg via ORAL
  Filled 2011-04-10 (×3): qty 1

## 2011-04-10 MED ORDER — ONDANSETRON HCL 2 MG/ML IV SOLN *I*
INTRAMUSCULAR | Status: AC
Start: 2011-04-10 — End: 2011-04-10
  Administered 2011-04-10: 4 mg via INTRAVENOUS
  Filled 2011-04-10: qty 2

## 2011-04-10 MED ORDER — ATORVASTATIN CALCIUM 40 MG PO TABS *I*
40.0000 mg | ORAL_TABLET | Freq: Every evening | ORAL | Status: DC
Start: 2011-04-10 — End: 2011-04-11
  Administered 2011-04-10: 40 mg via ORAL
  Filled 2011-04-10: qty 1

## 2011-04-10 MED ORDER — ASPIRIN 81 MG PO CHEW *I*
324.0000 mg | CHEWABLE_TABLET | Freq: Once | ORAL | Status: DC
Start: 2011-04-10 — End: 2011-04-10

## 2011-04-10 MED ORDER — MORPHINE SULFATE 10 MG/ML IJ SOLN
5.0000 mg | Freq: Once | INTRAMUSCULAR | Status: AC
Start: 2011-04-10 — End: 2011-04-10
  Administered 2011-04-10: 5 mg via INTRAVENOUS
  Filled 2011-04-10: qty 1

## 2011-04-10 MED ORDER — ACETAMINOPHEN 500 MG PO TABS *I*
1000.0000 mg | ORAL_TABLET | Freq: Three times a day (TID) | ORAL | Status: DC | PRN
Start: 2011-04-10 — End: 2011-04-11
  Administered 2011-04-10 – 2011-04-11 (×2): 1000 mg via ORAL
  Filled 2011-04-10 (×2): qty 2

## 2011-04-10 MED ORDER — ONDANSETRON HCL 2 MG/ML IV SOLN *I*
4.0000 mg | Freq: Once | INTRAMUSCULAR | Status: AC
Start: 2011-04-10 — End: 2011-04-10

## 2011-04-10 MED ORDER — NITROGLYCERIN 0.4 MG SL SUBL *I*
0.4000 mg | SUBLINGUAL_TABLET | SUBLINGUAL | Status: DC | PRN
Start: 2011-04-10 — End: 2011-04-11

## 2011-04-10 MED ORDER — ASPIRIN 81 MG PO TBEC *I*
81.0000 mg | DELAYED_RELEASE_TABLET | Freq: Every day | ORAL | Status: DC
Start: 2011-04-11 — End: 2011-04-11
  Administered 2011-04-11: 81 mg via ORAL
  Filled 2011-04-10: qty 1

## 2011-04-10 NOTE — ED Notes (Signed)
md at bedside

## 2011-04-10 NOTE — ED Notes (Signed)
Report called to [id number]. Transferring care at this time.

## 2011-04-10 NOTE — ED Notes (Signed)
Trop of 0.24 reported to Dr Mellissa Kohut. EHumphrey RN

## 2011-04-10 NOTE — ED Provider Notes (Signed)
History     Chief Complaint   Patient presents with   . Chest Pain     HPI Comments: 45 yo male with recent STEMI and stent to LAD last week here for intermittent chest pressure that resolved with NTG initially and now is not. He called cardiologist and told to come in. He reports the character of the pain is the same when he had his MI last week but the intensity is much less.    He denies recent SOB, cough, phlegm, fevers, other complaints. He is taking his Brilinta as prescribed.    The history is provided by the patient.       Past Medical History   Diagnosis Date   . Prostatitis    . Anxiety    . Benign prostatic hypertrophy    . Depression    . Discogenic syndrome    . Nicotine dependence        Past Surgical History   Procedure Date   . Ankle surgery        Family History   Problem Relation Age of Onset   . Diabetes Mother    . High cholesterol Mother    . Heart disease Father    . Diabetes Father        Social History      reports that he has been smoking Cigarettes.  He has a 19.2 pack-year smoking history. He has quit using smokeless tobacco. He reports that he drinks alcohol. He reports that he does not use illicit drugs. His sexual activity history not on file.    Living Situation     Questions Responses    Patient lives with     Homeless     Caregiver for other family member     External Services     Employment     Domestic Violence Risk           Review of Systems   Review of Systems   Constitutional: Negative for fever, activity change and fatigue.   HENT: Negative for congestion, rhinorrhea, neck pain and neck stiffness.    Eyes: Negative for pain.   Respiratory: Negative for cough, chest tightness, shortness of breath and wheezing.    Cardiovascular: Positive for chest pain.   Gastrointestinal: Negative for nausea, vomiting, abdominal pain and diarrhea.   Genitourinary: Negative for urgency and flank pain.   Musculoskeletal: Negative for back pain and arthralgias.   Skin: Negative for wound.    Neurological: Negative for dizziness, weakness and headaches.   Hematological: Does not bruise/bleed easily.   Psychiatric/Behavioral: Negative for confusion.       Physical Exam   BP 115/64  Pulse 59  Temp 35.6 C (96.1 F)  Resp 16  Ht 1.803 m (5\' 11" )  Wt 83.915 kg (185 lb)  BMI 25.80 kg/m2  SpO2 97%    Physical Exam   Nursing note and vitals reviewed.  Constitutional: He is oriented to person, place, and time. He appears well-developed and well-nourished. No distress.   HENT:   Head: Normocephalic and atraumatic.   Eyes: EOM are normal.   Neck: Normal range of motion. Neck supple.   Cardiovascular: Normal rate, regular rhythm and normal heart sounds.    Pulmonary/Chest: Effort normal and breath sounds normal. No respiratory distress. He has no wheezes. He has no rales.   Abdominal: Soft. Bowel sounds are normal. He exhibits no distension. There is no tenderness. There is no guarding.   Musculoskeletal: Normal range  of motion. He exhibits no tenderness.        FROM UE/LE B/L. 5/5 UE/LE B/L   Neurological: He is alert and oriented to person, place, and time. No cranial nerve deficit. He exhibits normal muscle tone.   Skin: Skin is warm and dry.   Psychiatric: He has a normal mood and affect.       Medical Decision Making   MDM  Number of Diagnoses or Management Options  Diagnosis management comments: Patient seen by me today, 04/10/2011 at 1218    Assessment:  45 y.o., male comes to the ED with chest pain and recent MI  Differential Diagnosis includes ACS (NSTEMI) as result of stent occlusion/migration, CHF/PE unlikely given recent ACS event and history. No obvious STEMI on EKG.  Plan: Despite multiple SL NTG tabe, pt has persistent mild CP. Check trop, repeat EKG stable, ASA, IV Morphine and admit. Monitor.    I spoke to Dr. Morene Rankins and will likely admit for r/o ACS at minimum. Pt aware and agrees.        Zollie Beckers, MD    Zollie Beckers, MD  04/10/11 1254

## 2011-04-10 NOTE — ED Notes (Signed)
Pt presents to the ed via EMS. Pt was seen here on Friday with complete occulusion of the RCA. Was cathed and was cardioverted for continuous runs of V tach. Pt discharged Monday and states he took nitro all week with + relief of chest pain. Pt developed chest pain this morning. No relief with 4 nitro. Pt very anxious. Wife at bedside. EKG complete. On tele. VSS. Will continue to monitor and treat per order.

## 2011-04-10 NOTE — ED Notes (Signed)
Portable chest being done at bedside.

## 2011-04-10 NOTE — H&P (Addendum)
CCU Service - H&P    Chief Complaint: chest pain.    HPI:  45 y.o. Caucasian male with a history of nicotine dependence, depression and STEMI on 03/1911 ( LAD/diagonal occlusion s/p BMS) who presented with chest pain.  The patient was discharged 4 days ago.  Since the discharge, the patient has had a few episodes of chest pain resolved with sl nitro.  The patient had another episode of chest pain around 10 AM while watching TV. He took nitro sl x2 without the resolution. He called [facility] and was asked to come to ED.  On route, he had nitro sl x2 with near complete resolution of the pain.    He describes 4/10, substernal chest pressure, radiating to the R breast, exacerbated by deep breath.  Symptoms are similar in character and location to his MI, but are much less intense.  Palpation of the chest does not exacerbate the pain.  No associated nausea, vomiting, abd pain, dyspnea, diaphoresis, pre-syncope or syncope.  Denies leg edema. Not positional.     In ED, he received morphine 5 mg IV x1 and Zofran IV x1.     Review of Systems:   A complete 12-point review of symptoms was completed and is negative except as documented in HPI.     Past Medical and Surgical History:  Past Medical History   Diagnosis Date   . Prostatitis    . Anxiety    . Benign prostatic hypertrophy    . Depression    . Discogenic syndrome    . Nicotine dependence      Past Surgical History   Procedure Date   . Ankle surgery        Medications:  No current facility-administered medications on file prior to encounter.     Current Outpatient Prescriptions on File Prior to Encounter   Medication Sig Dispense Refill   . aspirin 81 MG EC tablet Take 1 tablet (81 mg total) by mouth daily         . atorvastatin (LIPITOR) 40 MG tablet Take 1 tablet (40 mg total) by mouth every evening    30 tablet  2   . lisinopril (PRINIVIL,ZESTRIL) 5 MG tablet Take 1 tablet (5 mg total) by mouth daily    30 tablet  2   . metoprolol (LOPRESSOR) 25 MG tablet Take 1 tablet (25  mg total) by mouth 2 times daily    60 tablet  2   . Ticagrelor (BRILINTA) 90 MG TABS tablet Take 1 tablet (90 mg total) by mouth every 12 hours    60 tablet  11   . nitroglycerin (NITROSTAT) 0.4 MG SL tablet Place 1 tablet (0.4 mg total) under the tongue every 5 minutes as needed for Chest pain   May repeat 2 times then call 911 if pain persists.  25 tablet  5   . sertraline (ZOLOFT) 100 MG tablet Take 100 mg by mouth daily   TAKE 1 TABLET DAILY.          Allergies:  Allergies   Allergen Reactions   . Latex      Created by Conversion - 0;    . No Known Drug Allergy      Created by Conversion - 0;        Social History:  History     Social History   . Marital Status: Married     Spouse Name: N/A     Number of Children: N/A   .  Years of Education: N/A     Occupational History   . Not on file.     Social History Main Topics   . Smoking status: Current Everyday Smoker -- 0.8 packs/day for 24 years     Types: Cigarettes   . Smokeless tobacco: Former Neurosurgeon   . Alcohol Use: 0.0 oz/week     .5 drink(s) per week   . Drug Use: No   . Sexually Active: Not on file     Other Topics Concern   . Not on file     Social History Narrative   . No narrative on file       Family History:  Family History   Problem Relation Age of Onset   . Diabetes Mother    . High cholesterol Mother    . Heart disease Father    . Diabetes Father        Physical Exam:  No intake or output data in the 24 hours ending 04/10/11 1416  Filed Vitals:    04/10/11 1148 04/10/11 1221 04/10/11 1359   BP: 107/82 115/64 112/65   Pulse: 75 59 63   Temp: 35.6 C (96.1 F)     Resp: 16 16 16    Height: 1.803 m (5\' 11" )     Weight: 83.915 kg (185 lb)     SpO2: 97% 97% 99%     General: NAD   HEENT: MMM, no O/P lesions  Neck: supple, JVP non-elevated  Cardiac: RRR, S1S2, no M/G/R  Lungs: clear to auscultation bilaterally  Abdomen: soft, nontender, nondistended, normal bowel sounds  Extremities: no LEE, good distal pulses  Neuro: grossly intact    Labs:    Lab 04/10/11 1256  04/07/11 0408 04/06/11 0058   WBC 7.8 7.9 7.9   HGB 14.1 13.8 13.7   HCT 41 41 41   PLT 313 261 275       Lab 04/10/11 1256 04/07/11 0408 04/06/11 0058   NA 135 139 141   K 4.7 3.7 3.9   CL 101 104 104   CO2 21 22 24    CREAT 0.69 0.69 0.77   ALT 37 -- --   AST 26 -- --       Lab 04/10/11 1256   INR 1.0   PTT --     Troponin 0.24    Chol/HDL Ratio   Date Value Range Status   04/05/2011 5.7   Final        Cholesterol   Date Value Range Status   04/05/2011 198   Final      REFERENCE RANGE:  < 200 Desirable                      200-239 Borderline High                        > 240 High        HDL   Date Value Range Status   04/05/2011 35   Final      REFERENCE RANGE:  < 40 Low                        > 60 High        LDL Calculated   Date Value Range Status   04/05/2011 140   Final      REFERENCE RANGE:  < 100 Optimal  100-129 Near or above optimal                      130-159 Borderline High                      160-189 High                        > 189 Very High        Triglycerides   Date Value Range Status   04/05/2011 115   Final      REFERENCE RANGE:  < 150 Normal                      150-199 Borderline High                      200-499 High                        > 500 Very High     ECG: Sinus bradycardia at 57 bmp with no acute ischemic changes. Unchanged from before.     Telemetry: none    Radiology: * Portable Chest Standard Ap Single View  04/10/2011  Impression:  Normal     Cardiology Studies:  04/04/11 LHC: One vessel coronary artery disease (LAD). Mildly reduced left ventricular function, LVEF 40%. Successful percutaneous coronary intervention (bare metal stenting) of the left anterior descending artery. Succesful clot extraction form D2.     ASSESSMENT:  45 y.o. male with a history of anxiety and nicotine dependence recent anterolateral STEMI s/p PCI with LAD with bare metal stent who presented with recurrent chest pain. Given history, he most likely has pleuritic chest pain (exacerbated  by deep breath).  Positive troponin is most likely 2/2 recent STEMI.     PLAN:  1. Chest pain: pleuritic. Could be anxiety. Less likely to be ACS.   - First troponin is 0.24. Continue to trend troponin and isoenzymes Q8H x2.   - Cont ASA 81 mg,  atorvastatin 40mg  daily, lisinopril 5 mg daily, ticagrelor 90 mg BID, and metoprolol 25 mg BID  - Telemetry.     2. Anxiety: cont sertraline.     F: No IVF   E: daily lytes   N: low sodium diet.     DVT ppx: Fragmin SQ     Dispo: in AM if troponin is trending down.     FULL CODE    CCU Attending Addendum  Patient seen and evaluated with Dr Deanna Artis- I agree with the above findings and assessment.  Note edited as appropriate.  [name] is a 45 y.o. male who was recently discharged after LAD STEMI on Friday.  His symptoms are atypical, but was brought to CCU given his risk.  Troponin mildly elevated as residual from event- trend.  If decreases, will plan for stress test tomorrow in anticipation of beginning cardiac rehabilitation.        [name], MD

## 2011-04-10 NOTE — ED Notes (Signed)
Bed:PA-05<BR> Expected date:04/10/11<BR> Expected time:10:43 AM<BR> Means of arrival: Other<BR> Comments:<BR> ADULT CALL-IN    Patient Name: [name] [id number]    AGE: 45    DOB: [birth year]    PCP/Service Referral: Audery Amel of cardiology NP Fannie Knee    Patient Information Note: Pt had MI last Friday went to cath lab had clot retrieval. Now with CP again advised to come by EMS lives 1 hour away.    Requested Evaluation By: Ed cards as needed    IF CALL BACK REQUESTED: to card Pedulla    Is referring physician  an [facility] admitting provider? Yes    Call reported NW:[id number]    Author Dayton Bailiff, RN as of 04/10/2011 at 10:43 AM

## 2011-04-10 NOTE — Telephone Encounter (Signed)
Pt called w/complaints of chest discomfort.  Similar in character to last week prior to PCI but less severe (3/10 as opposed to 10/10).  SL NTG offered no relief.  Symptoms began while watching TV, last week occurred while driving.  I paged Ang Pedulla to discuss and he advised that patient should be evaluated.  I called pt back and advised him to call an ambulance and request transport to [facility]; pt agreed.  ED Communication Nurse notified and will be expecting patient.

## 2011-04-10 NOTE — ED Provider Progress Notes (Signed)
ED Provider Progress Note     Elevated trop at .24, possibly related to recent infarct but no comparison values. Will admit to Dr. Audery Amel. Pt aware and agrees.      Zollie Beckers, MD, 04/10/2011, 1:49 PM

## 2011-04-11 ENCOUNTER — Other Ambulatory Visit: Payer: Self-pay | Admitting: Gastroenterology

## 2011-04-11 ENCOUNTER — Ambulatory Visit: Payer: Self-pay

## 2011-04-11 LAB — BASIC METABOLIC PANEL
Anion Gap: 9 (ref 7–16)
CO2: 28 mmol/L (ref 20–28)
Calcium: 9.4 mg/dL (ref 8.6–10.2)
Chloride: 102 mmol/L (ref 96–108)
Creatinine: 0.77 mg/dL (ref 0.67–1.17)
GFR,Black: >59 *
GFR,Caucasian: >59 *
Glucose: 97 mg/dL (ref 60–99)
Lab: 19 mg/dL (ref 6–20)
Potassium: 4.9 mmol/L (ref 3.3–5.1)
Sodium: 139 mmol/L (ref 133–145)

## 2011-04-11 LAB — CBC AND DIFFERENTIAL
Baso # K/uL: 0 10*3/uL (ref 0.0–0.1)
Basophil %: 0.4 % (ref 0.2–1.2)
Eos # K/uL: 0.1 10*3/uL (ref 0.0–0.5)
Eosinophil %: 1.9 % (ref 0.8–7.0)
Hematocrit: 41 % (ref 40–51)
Hemoglobin: 13.9 g/dL (ref 13.7–17.5)
Lymph # K/uL: 1.7 10*3/uL (ref 1.3–3.6)
Lymphocyte %: 29.3 % (ref 21.8–53.1)
MCV: 94 fL — ABNORMAL HIGH (ref 79–92)
Mono # K/uL: 0.6 10*3/uL (ref 0.3–0.8)
Monocyte %: 10.4 % (ref 5.3–12.2)
Neut # K/uL: 3.3 10*3/uL (ref 1.8–5.4)
Platelets: 321 10*3/uL (ref 150–330)
RBC: 4.4 MIL/uL — ABNORMAL LOW (ref 4.6–6.1)
RDW: 12.7 % (ref 11.6–14.4)
Seg Neut %: 58 % (ref 34.0–67.9)
WBC: 5.7 10*3/uL (ref 4.2–9.1)

## 2011-04-11 LAB — EKG 12-LEAD
P: 49 degrees
P: 44 degrees
QRS: 55 degrees
QRS: 53 degrees
Rate: 60 {beats}/min
Rate: 57 {beats}/min
Severity: ABNORMAL
Severity: ABNORMAL
Severity: ABNORMAL
Severity: ABNORMAL
T: 51 degrees
T: 30 degrees

## 2011-04-11 LAB — PROTIME-INR
INR: 1.1 (ref 0.9–1.1)
Protime: 13.5 s (ref 11.9–14.7)

## 2011-04-11 LAB — TROPONIN T: Troponin T: 0.16 ng/mL — ABNORMAL HIGH (ref 0.00–0.02)

## 2011-04-11 LAB — APTT: aPTT: 28.8 s (ref 22.3–35.3)

## 2011-04-11 LAB — CK ISOENZYMES
CK: 96 U/L (ref 46–171)
Mass CKMB: 2.3 ng/mL (ref 0.0–4.9)

## 2011-04-11 MED ORDER — ACETAMINOPHEN 325 MG PO TABS *I*
650.0000 mg | ORAL_TABLET | Freq: Once | ORAL | Status: AC
Start: 2011-04-11 — End: 2011-04-11
  Administered 2011-04-11: 650 mg via ORAL

## 2011-04-11 MED ORDER — ACETAMINOPHEN 325 MG PO TABS *I*
650.0000 mg | ORAL_TABLET | Freq: Two times a day (BID) | ORAL | Status: DC
Start: 2011-04-11 — End: 2011-04-11
  Filled 2011-04-11: qty 2

## 2011-04-11 NOTE — Progress Notes (Addendum)
Cardiology Service - Progress Note    24-hour events: none    SUBJECTIVE:  Pt has had intermittent non-radiating substernal/right sided chest pain exacerbated with deep inspiration.  Denies dyspnea, orthopnea, pre-syncope, syncope or leg edema.     OBJECTIVE:    Medications:  Scheduled Meds:     . dalteparin  5,000 Units Subcutaneous Daily   . aspirin  81 mg Oral Daily   . atorvastatin  40 mg Oral QPM   . lisinopril  5 mg Oral Daily   . metoprolol  25 mg Oral BID   . sertraline  100 mg Oral Daily   . Ticagrelor  90 mg Oral Q12H     Continuous Infusions:   PRN Meds:.nitroglycerin, acetaminophen    Allergies:  Allergies   Allergen Reactions   . Latex      Created by Conversion - 0;    . No Known Drug Allergy      Created by Conversion - 0;        Physical Exam:  No intake or output data in the 24 hours ending 04/11/11 0718  Filed Vitals:    04/10/11 1614 04/10/11 2100 04/10/11 2333 04/11/11 0602   BP: 113/64 129/61 108/60 114/60   Pulse: 53 61 60 57   Temp: 36.4 C (97.6 F)  36.3 C (97.3 F)    TempSrc: Temporal  Temporal    Resp: 18      Height:       Weight:       SpO2: 100%  98%      General: NAD  HEENT: MMM, no oropharyngeal lesions  Neck: supple, JVP non-elevated  Cardiac: RRR, S1S2, no murmurs, gallops, or rubs  Lungs: clear to auscultation bilaterally  Abdomen: soft, non-tender, non-distended  Extremities: no LEE  Neuro: grossly intact    Labs:    Lab 04/11/11 0632 04/10/11 1256 04/07/11 0408   WBC 5.7 7.8 7.9   HGB 13.9 14.1 13.8   HCT 41 41 41   PLT 321 313 261       Lab 04/11/11 0632 04/10/11 1256 04/07/11 0408   NA 139 135 139   K 4.9 4.7 3.7   CL 102 101 104   CO2 28 21 22    CREAT 0.77 0.69 0.69   ALT -- 37 --   AST -- 26 --       Lab 04/11/11 0632 04/10/11 1256   INR 1.1 1.0   PTT 28.8 --     No components found with this basename: CKTOTAL:3,TROPONINI:3,TROPONINT:3,CKMBINDEX:3  Chol/HDL Ratio   Date Value Range Status   04/05/2011 5.7   Final        Cholesterol   Date Value Range Status   04/05/2011 198    Final      REFERENCE RANGE:  < 200 Desirable                      200-239 Borderline High                        > 240 High        HDL   Date Value Range Status   04/05/2011 35   Final      REFERENCE RANGE:  < 40 Low                        > 60 High  LDL Calculated   Date Value Range Status   04/05/2011 140   Final      REFERENCE RANGE:  < 100 Optimal                      100-129 Near or above optimal                      130-159 Borderline High                      160-189 High                        > 189 Very High        Triglycerides   Date Value Range Status   04/05/2011 115   Final      REFERENCE RANGE:  < 150 Normal                      150-199 Borderline High                      200-499 High                        > 500 Very High       Telemetry: sinus bradycardia in 50s.     Radiology: * Portable Chest Standard Ap Single View  04/10/2011  Impression:  Normal     Cardiology Studies:  04/04/11 LHC: One vessel coronary artery disease (LAD). Mildly reduced left ventricular function, LVEF 40%. Successful percutaneous coronary intervention (bare metal stenting) of the left anterior descending artery. Succesful clot extraction form D2.     ASSESSMENT:  45 y.o. male with a history of anxiety and nicotine dependence recent anterolateral STEMI s/p PCI with LAD with bare metal stent who presented with recurrent chest pain. Given history, he most likely has pleuritic chest pain (exacerbated by deep breath). Positive troponin is most likely 2/2 recent STEMI.     PLAN:   1. Chest pain: pleuritic. No evidence of ACS at this time. Troponin elevation is 2/2 recent ACS   - Troponin  0.24 ->0.2->0.16.   - Cont ASA 81 mg, atorvastatin 40mg  daily, lisinopril 5 mg daily, ticagrelor 90 mg BID, and metoprolol 25 mg BID   - Telemetry.   - Nuclear stress test in anticipation of cardiac rehab.     2. Anxiety: cont sertraline.     F: No IVF   E: daily lytes   N: low sodium diet.     DVT ppx: Fragmin SQ     Dispo: after  nuclear stress test.     FULL CODE      CCU Attending Addendum  Patient seen and evaluated with Dr Earley Abide- I agree with the above findings and assessment.  Note edited as appropriate.  [name] is a 45 y.o. male who was recently discharged after LAD STEMI on Friday. His symptoms are atypical, but was brought to CCU given his risk and recent MI.  SPECT showed infarct with some ischemia and hibernating myocardium, consistent with recovering LAD infarction.  He symptoms are more non-cardiac in description today and we will try antacids.  Home today with follow-up as previously scheduled.        [name], MD

## 2011-04-11 NOTE — Discharge Instructions (Signed)
Admission Diagnosis: chest pain  Discharge Diagnosis:  Myocardial infarction    Procedures performed during this hospitalization:  Nuclear stress test    Brief Hospital Course:  Readmitted to [facility] with recurrent chest pain and EKG changes after recent hospitalization for myocardial infarction and angioplasty with stent;  Underwent nuclear stress test which showed minimal stress induced ischemia - medical management pursued    Recommended Diet: low salt, low fat, low cholesterol    Activity:  Walk and use stairs as tolerated.  Do not lift anything >10 lbs x 2-3 weeks. No driving x 1 week.  Do not return to work until cleared by MD.      Wound Care:  Pre-written guidelines after cardiac cath lab procedure     Pain Management:Pre-written guidelines for nitroglycerin use     Call MD for chest pain, shortness of breath.  For concerns specific to this hospitalization, call [phone] and ask to speak with Dr. Jodelle Gross    Patient encouraged to participate in cardiac rehab program after seen by cardiologist.      CMS indicators:  ACE inhibitor -yes  Beta Blocker - yes  Statin- yes  Smoking Cessation Counseling - n/a  ASA -yes  Ticagrelor  -yes    Smoking    Smoking can increase your chances of developing chronic health problems or worsen conditions you already have.  If you smoke you should quit. Smoking cessation information has been given to you for your review to help you quit.  Medications to help you quit are available.  Ask your doctor if you would like to receive these medications.

## 2011-04-11 NOTE — Progress Notes (Signed)
Utilization Management    Level of Care Inpatient as of the date 04/10/11      Valene Bors, RN     Pager: [phone]

## 2011-04-15 ENCOUNTER — Ambulatory Visit: Payer: Self-pay | Admitting: Cardiology

## 2011-04-15 ENCOUNTER — Encounter: Payer: Self-pay | Admitting: Cardiology

## 2011-04-15 VITALS — BP 102/60 | HR 60 | Ht 71.0 in | Wt 180.0 lb

## 2011-04-15 DIAGNOSIS — I219 Acute myocardial infarction, unspecified: Secondary | ICD-10-CM

## 2011-04-15 DIAGNOSIS — Z720 Tobacco use: Secondary | ICD-10-CM

## 2011-04-15 DIAGNOSIS — E785 Hyperlipidemia, unspecified: Secondary | ICD-10-CM

## 2011-04-15 DIAGNOSIS — I213 ST elevation (STEMI) myocardial infarction of unspecified site: Secondary | ICD-10-CM

## 2011-04-15 DIAGNOSIS — I10 Essential (primary) hypertension: Secondary | ICD-10-CM

## 2011-04-15 DIAGNOSIS — R079 Chest pain, unspecified: Secondary | ICD-10-CM

## 2011-04-15 NOTE — Progress Notes (Signed)
Reason for visit: Follow-up of recent hospitalization for STEMI    History of Present Illness:  We had the pleasure of seeing [name] today in our [facility].  He is a 45 y.o. with tobacco abuse and anxiety that was admitted to the [facility] CCU on 10/19 with anterior STEMI.  He received thrombectomy and successful revascularization with a bare-metal stent to his mid left anterior descending artery.  His estimated left ventricular ejection fraction was approximately 40%.  In the first 24 hours of his stay, he developed prolonged periods of accelerated idioventricular rhythm and nonsustained ventricular tachycardia.  These resolved with time and titration of beta blocker.  The remainder of his hospitalization was uncomplicated and he was discharged on October 22 good condition.  Unfortunately, several days later he developed atypical chest pains that prompted ED evaluation.  Serial biomarkers continued to decline and a nuclear stress test revealed minimal peri-infarct ischemia.  Medical management was continued and he was discharged to follow-up with [place] in clinic today.    [name] is without specific complaint today.  He denies chest discomfort, palpitations, lightheadedness, syncope, orthopnea, or edema.  He does not some early fatigue and dyspnea with walks through his neighborhood.  He has been tolerating all medications and is compliant with regimen and dietary changes.        Past Medical History   Diagnosis Date   . Prostatitis    . Anxiety    . Benign prostatic hypertrophy    . Depression    . Discogenic syndrome    . Nicotine dependence    . Myocardial infarction 04/04/11     stemi     Past Surgical History   Procedure Date   . Ankle surgery    . Coronary angioplasty with stent placement 04/04/11     BMS to LAD   . Coronary angioplasty 04/04/11     with BMS to LAD         Current Outpatient Prescriptions   Medication Sig   . aspirin 81 MG EC tablet Take 1 tablet (81 mg  total) by mouth daily     . atorvastatin (LIPITOR) 40 MG tablet Take 1 tablet (40 mg total) by mouth every evening     . lisinopril (PRINIVIL,ZESTRIL) 5 MG tablet Take 1 tablet (5 mg total) by mouth daily     . metoprolol (LOPRESSOR) 25 MG tablet Take 1 tablet (25 mg total) by mouth 2 times daily     . Ticagrelor (BRILINTA) 90 MG TABS tablet Take 1 tablet (90 mg total) by mouth every 12 hours     . nitroglycerin (NITROSTAT) 0.4 MG SL tablet Place 1 tablet (0.4 mg total) under the tongue every 5 minutes as needed for Chest pain   May repeat 2 times then call 911 if pain persists.   . sertraline (ZOLOFT) 100 MG tablet Take 100 mg by mouth daily   TAKE 1 TABLET DAILY.        Allergies   Allergen Reactions   . Latex      Created by Conversion - 0;    . No Known Drug Allergy      Created by Conversion - 0;        Family History   Problem Relation Age of Onset   . Diabetes Mother    . High cholesterol Mother    . Heart disease Father    . Diabetes Father  History     Social History   . Marital Status: Married     Spouse Name: N/A     Number of Children: N/A   . Years of Education: N/A     Social History Main Topics   . Smoking status: Former Smoker -- 0.8 packs/day for 24 years     Types: Cigarettes     Quit date: 04/04/2011   . Smokeless tobacco: Former Neurosurgeon   . Alcohol Use: 0.0 oz/week     .5 drink(s) per week   . Drug Use: No   . Sexually Active: None     Other Topics Concern   . None     Social History Narrative   . None         Review of Systems:    General: No significant weight change, fatigue, fevers  Skin: No new lesions  Eyes: Glasses  ENT: No nasal symptoms, hearing loss, sore throat  Cardiovascular: HPI  Pulmonary: HPI  GI: No nausea, vomiting, or change in bowel habits  GU: No dysuria or polyuria  Heme: No bleeding/bruisability   Endo: No DM, thyroid normal  Musculoskeletal: No new arthralgias/myalgias  Neuro: No headaches, seizures, stroke symptoms      PHYSICAL EXAM:  BP 102/60  Pulse 60  Ht 1.803 m  (5\' 11" )  Wt 81.647 kg (180 lb)  BMI 25.10 kg/m2  General: alert, full, NAD  HEENT: anicteric, MMM, no E/E OP, conj pink, no arcus senilis   Neck: no JVD, bruits, or LAD  CV: RRR, ns1/s2, no murmurs, rubs, or gallops  Pulm: CTA B  Abd: soft, NT, ND, +BS  Ext: no edema or cyanosis, 2+ distal pulses  Neuro: no gross focal deficits  Skin: no visible lesions      Coronary Angiogram (04/04/11):  One vessel coronary artery disease (LAD). Mildly reduced left ventricular function. Successful percutaneous coronary intervention (bare metal stenting) of the left anterior descending artery. Succesful clot extraction form D2.     SPECT (04/11/11):  PERFUSION: Myocardial perfusion SPECT imaging demonstrates: (1) Slight vasodilator stress induced ischemia of the apex and apical anterior region involving 4% LV mass superimposed on (2) partial thickness infarction of this region extending to the mid-anterior wall involving 3% LV mass. (3) Consider benefits and risks of aggressive medical therapy, if clinically indicated.   FUNCTION: ECG-gated SPECT myocardial wall motion study shows: (1) Upper normal LV volume and low normal EF are noted with evidence of mild to moderate dysfunction of the apex, peri-apex and mild dysfunction of the anterolateral wall. (2) RV size and function appear normal. (3) No prior study is available for comparison.       Assessment & Plan:   [name] is a 45 y.o. with was admitted to the [facility] CCU on 10/19 with anterior STEMI.      1) Coronary artery disease:  [name] has had no recurrent anginal symptoms, decline in exercise tolerance, or need for sublingual nitroglycerin since discharge last week.  His LAD was successfully revascularized and the remained of his coronary arteries were without significant stenosis.  His left ventricular function is mildly reduced, but I anticipate recovery in the next few weeks.  In the absence of symptoms, there is no need for additional workup at  this time.  We will continue to aggressively manage modifiable risk factors and treat with antiplatelets, statin, ACEI, and BB.  His blood pressure limits further titration today     - Continue aspirin,  ticagrelor, lisinopril, metoprolol, and atorvastatin   - Cardiac rehabilitation referral placed   - SL NG as needed   - Defer additional workup at this time    2) Hypertension:  Systolic and diastolic blood pressure are at goal of 140/90 mmHg on current regimen.     - Continue current medications   - Low sodium diet (<4 g/day)   - Electrolytes within normal range (04/11/11)    3) Hyperlipidemia: LDL and non-HDL below goal of <70 and <161 mg/dL on recent blood work (09/60/45), prior to statin usage.    - Continue current atorvastatin dosing    - Obtain fasting lipid panel in 3 months    4) Tobacco Abuse: Discussed tobacco's contribution to his increased vascular risk.  He has not smoked since event and we will continue to reinforce at each visit.      [name], MD  Cardiovascular Disease    Suggest follow-up in 3 months.  Thank you for allowing [place] to participate in this patient's care.  Please call or e-mail ([email or web address]) with any questions or concerns regarding his care.

## 2011-04-21 ENCOUNTER — Ambulatory Visit: Payer: Self-pay | Admitting: Primary Care

## 2011-04-21 ENCOUNTER — Encounter: Payer: Self-pay | Admitting: Primary Care

## 2011-04-21 VITALS — BP 110/60 | Ht 71.0 in | Wt 185.0 lb

## 2011-04-21 DIAGNOSIS — I2510 Atherosclerotic heart disease of native coronary artery without angina pectoris: Secondary | ICD-10-CM

## 2011-04-21 DIAGNOSIS — E7800 Pure hypercholesterolemia, unspecified: Secondary | ICD-10-CM

## 2011-04-21 DIAGNOSIS — I10 Essential (primary) hypertension: Secondary | ICD-10-CM

## 2011-04-25 ENCOUNTER — Ambulatory Visit: Payer: Self-pay | Admitting: Cardiology

## 2011-04-25 ENCOUNTER — Ambulatory Visit
Admit: 2011-04-25 | Discharge: 2011-04-25 | Disposition: A | Discharge status: Home / Self Care | Payer: Self-pay | Source: Ambulatory Visit | Attending: Cardiology | Admitting: Cardiology

## 2011-04-25 ENCOUNTER — Observation Stay
Admit: 2011-04-25 | Disposition: A | Discharge status: Home / Self Care | Payer: Self-pay | Source: Ambulatory Visit | Attending: Emergency Medicine | Admitting: Emergency Medicine

## 2011-04-25 ENCOUNTER — Encounter: Payer: Self-pay | Admitting: Emergency Medicine

## 2011-04-25 ENCOUNTER — Encounter: Payer: Self-pay | Admitting: Cardiology

## 2011-04-25 ENCOUNTER — Other Ambulatory Visit: Payer: Self-pay | Admitting: Gastroenterology

## 2011-04-25 ENCOUNTER — Telehealth: Payer: Self-pay

## 2011-04-25 DIAGNOSIS — I10 Essential (primary) hypertension: Secondary | ICD-10-CM

## 2011-04-25 DIAGNOSIS — R079 Chest pain, unspecified: Secondary | ICD-10-CM

## 2011-04-25 DIAGNOSIS — I2510 Atherosclerotic heart disease of native coronary artery without angina pectoris: Secondary | ICD-10-CM

## 2011-04-25 DIAGNOSIS — E785 Hyperlipidemia, unspecified: Secondary | ICD-10-CM

## 2011-04-25 LAB — HOLD BLUE

## 2011-04-25 LAB — CK ISOENZYMES
CK: 128 U/L (ref 46–171)
Mass CKMB: 1.6 ng/mL (ref 0.0–4.9)

## 2011-04-25 LAB — CBC AND DIFFERENTIAL
Baso # K/uL: 0 10*3/uL (ref 0.0–0.1)
Basophil %: 0.2 % (ref 0.2–1.2)
Eos # K/uL: 0.1 10*3/uL (ref 0.0–0.5)
Eosinophil %: 1.7 % (ref 0.8–7.0)
Hematocrit: 39 % — ABNORMAL LOW (ref 40–51)
Hemoglobin: 13.5 g/dL — ABNORMAL LOW (ref 13.7–17.5)
Lymph # K/uL: 2.2 10*3/uL (ref 1.3–3.6)
Lymphocyte %: 36.7 % (ref 21.8–53.1)
MCV: 94 fL — ABNORMAL HIGH (ref 79–92)
Mono # K/uL: 0.6 10*3/uL (ref 0.3–0.8)
Monocyte %: 9.9 % (ref 5.3–12.2)
Neut # K/uL: 3 10*3/uL (ref 1.8–5.4)
Platelets: 269 10*3/uL (ref 150–330)
RBC: 4.2 MIL/uL — ABNORMAL LOW (ref 4.6–6.1)
RDW: 12.8 % (ref 11.6–14.4)
Seg Neut %: 51.3 % (ref 34.0–67.9)
WBC: 5.9 10*3/uL (ref 4.2–9.1)

## 2011-04-25 LAB — HOLD RED

## 2011-04-25 LAB — BASIC METABOLIC PANEL
Anion Gap: 12 (ref 7–16)
CO2: 26 mmol/L (ref 20–28)
Calcium: 8.7 mg/dL (ref 8.6–10.2)
Chloride: 101 mmol/L (ref 96–108)
Creatinine: 0.71 mg/dL (ref 0.67–1.17)
GFR,Black: >59 *
GFR,Caucasian: >59 *
Glucose: 95 mg/dL (ref 60–99)
Lab: 16 mg/dL (ref 6–20)
Potassium: 5 mmol/L (ref 3.3–5.1)
Sodium: 139 mmol/L (ref 133–145)

## 2011-04-25 LAB — TROPONIN T: Troponin T: <0.01 ng/mL (ref 0.00–0.02)

## 2011-04-25 LAB — BLOOD BANK HOLD LAVENDER

## 2011-04-25 MED ORDER — METOPROLOL TARTRATE 25 MG PO TABS *I*
25.0000 mg | ORAL_TABLET | Freq: Two times a day (BID) | ORAL | Status: DC
Start: 2011-04-25 — End: 2011-04-26
  Administered 2011-04-26: 25 mg via ORAL
  Filled 2011-04-25 (×2): qty 1

## 2011-04-25 MED ORDER — ACETAMINOPHEN 325 MG PO TABS *I*
650.0000 mg | ORAL_TABLET | ORAL | Status: DC | PRN
Start: 2011-04-25 — End: 2011-04-26

## 2011-04-25 MED ORDER — NITROGLYCERIN 0.4 MG SL SUBL *I*
0.4000 mg | SUBLINGUAL_TABLET | SUBLINGUAL | Status: DC | PRN
Start: 2011-04-25 — End: 2011-04-26

## 2011-04-25 MED ORDER — ASPIRIN 325 MG PO TABS *I*
325.0000 mg | ORAL_TABLET | Freq: Every day | ORAL | Status: DC
Start: 2011-04-26 — End: 2011-04-26
  Filled 2011-04-25: qty 1

## 2011-04-25 MED ORDER — MORPHINE SULFATE 4 MG/ML IJ SOLN
4.0000 mg | INTRAMUSCULAR | Status: DC | PRN
Start: 2011-04-25 — End: 2011-04-26

## 2011-04-25 MED ORDER — ATORVASTATIN CALCIUM 20 MG PO TABS *I*
40.0000 mg | ORAL_TABLET | Freq: Every evening | ORAL | Status: DC
Start: 2011-04-25 — End: 2011-04-26
  Administered 2011-04-25: 40 mg via ORAL
  Filled 2011-04-25: qty 2

## 2011-04-25 MED ORDER — TICAGRELOR 90 MG PO TABS *I*
90.0000 mg | ORAL_TABLET | Freq: Two times a day (BID) | ORAL | Status: DC
Start: 2011-04-25 — End: 2011-04-26
  Administered 2011-04-25 – 2011-04-26 (×2): 90 mg via ORAL
  Filled 2011-04-25 (×4): qty 1

## 2011-04-25 MED ORDER — SERTRALINE HCL 50 MG PO TABS *I*
100.0000 mg | ORAL_TABLET | Freq: Every day | ORAL | Status: DC
Start: 2011-04-25 — End: 2011-04-26
  Administered 2011-04-25: 100 mg via ORAL
  Filled 2011-04-25 (×4): qty 2

## 2011-04-25 MED ORDER — LISINOPRIL 5 MG PO TABS *I*
5.0000 mg | ORAL_TABLET | Freq: Every day | ORAL | Status: DC
Start: 2011-04-25 — End: 2011-04-26
  Administered 2011-04-26: 5 mg via ORAL
  Filled 2011-04-25: qty 1

## 2011-04-25 NOTE — ED Provider Notes (Signed)
History     Chief Complaint   Patient presents with   . Chest Pain   . Abnormal Ecg     HPI Comments: CC:  Chest pain      HPI:  45 y/o M who presented with MI to [facility] in Oct 2012, s/p bare metal stent to LAD.  Subsequently discharged and re-admitted to CCU for an episode of chest pain.  Nuclear study showed no new ischemia but resolving myocardium.  Had chest pain again today---worse with deep breaths and some positional changes.  Also has significant anxiety.       Seen by Dr. Audery Amel today in the office, and felt to be non-cardiac pain, but EKG has changed.      Sent to ED for troponin, and r/o MI in the EOU.  He will see pt tomorrow in the AM    The history is provided by the patient.       Past Medical History   Diagnosis Date   . Prostatitis    . Anxiety    . Benign prostatic hypertrophy    . Depression    . Discogenic syndrome    . Nicotine dependence    . Myocardial infarction 04/04/11     stemi   . Coronary artery disease             Past Surgical History   Procedure Date   . Ankle surgery    . Coronary angioplasty with stent placement 04/04/11     BMS to LAD   . Coronary angioplasty 04/04/11     with BMS to LAD   . Cardiac catherization        Family History   Problem Relation Age of Onset   . Diabetes Mother    . High cholesterol Mother    . Heart disease Father    . Diabetes Father          Social History      reports that he quit smoking about 3 weeks ago. His smoking use included Cigarettes. He has a 19.2 pack-year smoking history. He has quit using smokeless tobacco. He reports that he drinks alcohol. He reports that he does not use illicit drugs. His sexual activity history not on file.    Living Situation     Questions Responses    Patient lives with     Homeless No    Caregiver for other family member     External Services     Employment     Domestic Violence Risk           Review of Systems   Review of Systems   Constitutional: Negative for fever and chills.   Respiratory: Negative for chest  tightness.    Cardiovascular: Negative for chest pain (sharp).   Gastrointestinal: Negative for abdominal pain and anal bleeding.   Genitourinary: Negative for dysuria.   Neurological: Negative for dizziness.   All other systems reviewed and are negative.        Physical Exam     ED Triage Vitals   BP Heart Rate Resp Temp Temp Source SpO2 O2 Device O2 Flow Rate weight   04/25/11 1612 04/25/11 1612 04/25/11 1612 04/25/11 1612 04/25/11 1612 04/25/11 1612 04/25/11 1612 -- 04/25/11 1612   121/60 mmHg 57  17  36.8 C (98.2 F) TEMPORAL 96 % None (Room air)  82.555 kg (182 lb)       Physical Exam   Vitals reviewed.  Constitutional: He is  oriented to person, place, and time. He appears well-developed and well-nourished. No distress.   HENT:   Head: Atraumatic.   Eyes: EOM are normal.   Neck: Normal range of motion. Neck supple.   Cardiovascular: Normal rate and regular rhythm.    Pulmonary/Chest: Effort normal and breath sounds normal. He exhibits tenderness (right side of his chest, which reproduces his pain).   Abdominal: Soft. Bowel sounds are normal. He exhibits no distension. There is no tenderness.   Musculoskeletal: Normal range of motion. He exhibits no edema and no tenderness.   Neurological: He is alert and oriented to person, place, and time.   Skin: Skin is warm and dry.   Psychiatric: He has a normal mood and affect. His behavior is normal.       Medical Decision Making   MDM  Number of Diagnoses or Management Options  Diagnosis management comments: Patient seen by me today, 04/25/2011 at the time of arrival 4:09 PM    Assessment:  45 y.o., male comes to the ED with CP  Differential Diagnosis includes msk cp most likely, ACS less likely  Plan: EKG, CXR, labs.  He has had asa today.  Per discussions with Dr. Audery Amel, appropriate for EOU for r.o MI, which I agree with         Amount and/or Complexity of Data Reviewed  Clinical lab tests: ordered and reviewed  Tests in the radiology section of CPT: ordered and  reviewed  Tests in the medicine section of CPT: reviewed and ordered  Independent visualization of images, tracings, or specimens: yes          EKG:  Sinus brady @ 52, nl axis, t wave inversions I, aVL, V5-V6.  (read by me)      p CXR:  NAD (read by me)        PORTABLE CHEST SINGLE VIEW    Final Result: IMPRESSION: No acute pulmonary infiltrate.               Labs Reviewed   CBC AND DIFFERENTIAL - Abnormal; Notable for the following:     RBC 4.2 (*)      Hemoglobin 13.5 (*)      Hematocrit 39 (*)      MCV 94 (*)      All other components within normal limits   BASIC METABOLIC PANEL   TROPONIN T   HOLD BLUE   HOLD RED   BLOOD BANK HOLD LAVENDER   CK ISOENZYMES   CK ISOENZYMES               04/25/2011 5:34 PM:.   Pt reassured by negative troponin.  Agrees to stay overnight.  Raynelle Fanning accepts to Lynnae January, MD    Timoteo Ace, MD  04/25/11 [phone]

## 2011-04-25 NOTE — ED Obs Notes (Signed)
ED OBSERVATION ADMISSION NOTE    Patient seen by me today, 04/25/2011 at 6:03 PM    Current patient status: Observation    History     Chief Complaint   Patient presents with   . Chest Pain   . Abnormal Ecg     HPI Comments: 45 year old male with PMH significant for HTN, anxiety, s/p anterior STEMI 04/04/11 and bare metal stent to LAD.  Pt reports this afternoon he was playing x-box and developed chest pain that radiated to his right neck. Pain rated 7/10 at its worst, and currently rated 2/10. Pain is exacerbated by deep inspiration. Pt took nitroglycerin x2 with some relief. Pt was seen by Dr. Audery Amel and was found to have EKG changes, and was sent to the ED.  Pt denies shortness of breath, palpitations, fever, chills, cough, nausea, vomiting, diaphoresis.     The history is provided by the patient. No language interpreter was used.       Past Medical History   Diagnosis Date   . Prostatitis    . Anxiety    . Benign prostatic hypertrophy    . Depression    . Discogenic syndrome    . Nicotine dependence    . Myocardial infarction 04/04/11     stemi   . Coronary artery disease        Past Surgical History   Procedure Date   . Ankle surgery    . Coronary angioplasty with stent placement 04/04/11     BMS to LAD   . Coronary angioplasty 04/04/11     with BMS to LAD   . Cardiac catherization        Family History   Problem Relation Age of Onset   . Diabetes Mother    . High cholesterol Mother    . Heart disease Father    . Diabetes Father        Social History      reports that he quit smoking about 3 weeks ago. His smoking use included Cigarettes. He has a 19.2 pack-year smoking history. He has quit using smokeless tobacco. He reports that he does not drink alcohol or use illicit drugs. His sexual activity history not on file.    Living Situation     Questions Responses    Patient lives with Spouse    Homeless No    Caregiver for other family member     External Services     Employment Employed    Domestic Violence Risk  No          Review of Systems   Review of Systems   Constitutional: Negative for fever, chills, diaphoresis, appetite change and fatigue.   HENT: Negative for congestion.    Eyes: Negative for visual disturbance.   Respiratory: Negative for cough, chest tightness, shortness of breath and wheezing.    Cardiovascular: Positive for chest pain. Negative for leg swelling.   Gastrointestinal: Negative for nausea, vomiting, abdominal pain and diarrhea.   Genitourinary: Negative for dysuria, urgency and frequency.   Musculoskeletal: Negative for myalgias.   Skin: Negative for rash.   Neurological: Negative for dizziness, syncope, weakness, light-headedness and headaches.   Psychiatric/Behavioral: The patient is not nervous/anxious.        Physical Exam   BP 110/56  Pulse 49  Temp(Src) 36.5 C (97.7 F) (Temporal)  Resp 18  Ht 1.803 m (5\' 11" )  Wt 82.555 kg (182 lb)  BMI 25.38 kg/m2  SpO2 97%  Physical Exam   Vitals reviewed.  Constitutional: He is oriented to person, place, and time. He appears well-developed and well-nourished. No distress.   HENT:   Head: Normocephalic and atraumatic.   Right Ear: External ear normal.   Left Ear: External ear normal.   Nose: Nose normal.   Eyes: Conjunctivae and EOM are normal. Pupils are equal, round, and reactive to light.   Neck: Normal range of motion. Neck supple.   Cardiovascular: Regular rhythm and normal heart sounds.  Bradycardia present.  Exam reveals no gallop.    No murmur heard.  Pulmonary/Chest: Effort normal and breath sounds normal. No respiratory distress. He has no wheezes. He has no rales. He exhibits tenderness.   Abdominal: Soft. Bowel sounds are normal. He exhibits no distension. There is no tenderness. There is no rebound and no guarding.   Musculoskeletal: Normal range of motion. He exhibits no edema and no tenderness.   Neurological: He is alert and oriented to person, place, and time. He has normal reflexes. He displays normal reflexes. No cranial nerve  deficit. Coordination normal.   Skin: Skin is warm and dry. No rash noted. He is not diaphoretic.   Psychiatric: He has a normal mood and affect. His behavior is normal. Judgment and thought content normal.       Tests    ZOX:WRUEA bradycardia @ 52, T wave inversions     Labs:   All labs in the last 24 hours   Recent Results (from the past 24 hour(s))   CBC AND DIFFERENTIAL    Collection Time    04/25/11  4:34 PM       Component Value Range    WBC 5.9  4.2 - 9.1 THOU/uL    RBC 4.2 (*) 4.6 - 6.1 MIL/uL    Hemoglobin 13.5 (*) 13.7 - 17.5 g/dL    Hematocrit 39 (*) 40 - 51 %    MCV 94 (*) 79 - 92 fL    RDW 12.8  11.6 - 14.4 %    Platelets 269  150 - 330 THOU/uL    Seg Neut % 51.3  34.0 - 67.9 %    Lymphocyte % 36.7  21.8 - 53.1 %    Monocyte % 9.9  5.3 - 12.2 %    Eosinophil % 1.7  0.8 - 7.0 %    Basophil % 0.2  0.2 - 1.2 %    Neut # K/uL 3.0  1.8 - 5.4 THOU/uL    Lymph # K/uL 2.2  1.3 - 3.6 THOU/uL    Mono # K/uL 0.6  0.3 - 0.8 THOU/uL    Eos # K/uL 0.1  0.0 - 0.5 THOU/uL    Baso # K/uL 0.0  0.0 - 0.1 THOU/uL   BASIC METABOLIC PANEL    Collection Time    04/25/11  4:34 PM       Component Value Range    Glucose 95  60 - 99 mg/dL    Sodium 540  981 - 191 mmol/L    Potassium 5.0  3.3 - 5.1 mmol/L    Chloride 101  96 - 108 mmol/L    CO2 26  20 - 28 mmol/L    Anion Gap 12  7 - 16    UN 16  6 - 20 mg/dL    Creatinine 4.78  2.95 - 1.17 mg/dL    GFR,Caucasian > 59      GFR,Black > 59      Calcium 8.7  8.6 - 10.2 mg/dL   TROPONIN T    Collection Time    04/25/11  4:34 PM       Component Value Range    Troponin T < 0.01  0.00 - 0.02 ng/mL   HOLD BLUE    Collection Time    04/25/11  4:34 PM       Component Value Range    Hold Blue HOLD TUBE     HOLD RED    Collection Time    04/25/11  4:34 PM       Component Value Range    Hold Red HOLD TUBE     BLOOD BANK HOLD LAVENDER    Collection Time    04/25/11  4:34 PM       Component Value Range    Bld Bank Hld Lav Lav In Bld Bank     CK ISOENZYMES    Collection Time    04/25/11  4:34 PM        Component Value Range    CK 128  46 - 171 U/L    Mass CKMB 1.6  0.0 - 4.9 ng/mL     Imaging: Chest x-ray: No acute pulmonary infiltrate    Medical Decision Making     Assessment:  45 y.o., male placed in OBS after evaluation in the ED for chest pain. First trop <0.01. CBC, CMP, CK unremarkable. Negative chest x-ray. VSS.     Differential Diagnosis includes acs, musculoskeletal pain, anxiety    Plan:   1) Neuro:  No active issues.    2) Pulmonary:  No active issues.    3) Cardiac:  Chest pain: Pt will be placed on telemetry to rule out arrythmia, monitor  HR, will get serial troponins to ensure no acute coronary syndrome. Cardiology to see pt in the am.            HTN: Continue home medications, VS q4hr    4) GI:  No active issues.    5) Renal:  No active issues.    6) ID:  No active issues.    7) Heme:  No active issues.    8) FEN:  Low fat diet, no indication for IVF at this time.    9) Endo:  No active issues.    10) Code Status:  Full Code      MDM    Samara Snide, NP

## 2011-04-25 NOTE — Telephone Encounter (Signed)
Spoke with pt and discussed with [name].  Pt to come in to office this afternoon for visit and ECG.

## 2011-04-25 NOTE — Progress Notes (Signed)
Utilization Management    Level of Care Observation service as of the date 04/25/2011      Marco Collie, RN     Pager: [phone]

## 2011-04-25 NOTE — Telephone Encounter (Signed)
Had heart attack 3 wks ago. He is experience chest discomfort and nitro not helping. Right side up into neck & when he takes deep breath

## 2011-04-25 NOTE — Progress Notes (Signed)
Reason for visit: Chest pain    History of Present Illness:  We had the pleasure of seeing [name] today in our [facility].  He is a 45 y.o. with tobacco abuse and anxiety that was admitted to the [facility] CCU on 10/19 with anterior STEMI.  He received thrombectomy and successful revascularization with a bare-metal stent to his mid left anterior descending artery.  His estimated left ventricular ejection fraction was approximately 40%.  Unfortunately, several days later he developed atypical chest pains that prompted ED evaluation.  Serial biomarkers continued to decline and a nuclear stress test revealed minimal peri-infarct ischemia.  When we saw him in clinic last week, he was doing well and had been without recurrence in symptoms.  This afternoon he began cardiac rehabilitation and was able to exercise lightly without complaint.  At around noon, while playing Xbox, he developed the sudden onset of chest discomfort.  Pain began retrosternally, but radiated into his right shoulder and neck.  Distribution was similar to prior MI.  It was rated at 4/10, but decreased to 1/10 with sublingual nitroglycerin x2.  He does not have any concomitant diaphoresis shortness of breath.  Pain is reproduced with manual palpation and deep inspiration.  He is also noted intermittent palpitations.  We urged him to come into the office for further assessment.      His electrocardiogram shows sinus rhythm with diffuse T-wave inversions that were not present on prior tracing.  A brief hand-held echocardiogram showed normal LV function without regional abnormality.  As he was continuing to have a low level of discomfort, we arranged for transfer to the emergency department to rule out myocardial injury.        Past Medical History   Diagnosis Date   . Prostatitis    . Anxiety    . Benign prostatic hypertrophy    . Depression    . Discogenic syndrome    . Nicotine dependence    . Myocardial  infarction 04/04/11     stemi   . Coronary artery disease      Past Surgical History   Procedure Date   . Ankle surgery    . Coronary angioplasty with stent placement 04/04/11     BMS to LAD   . Coronary angioplasty 04/04/11     with BMS to LAD   . Cardiac catherization          Current Outpatient Prescriptions   Medication Sig   . aspirin 81 MG EC tablet Take 1 tablet (81 mg total) by mouth daily     . atorvastatin (LIPITOR) 40 MG tablet Take 1 tablet (40 mg total) by mouth every evening     . lisinopril (PRINIVIL,ZESTRIL) 5 MG tablet Take 1 tablet (5 mg total) by mouth daily     . metoprolol (LOPRESSOR) 25 MG tablet Take 1 tablet (25 mg total) by mouth 2 times daily     . Ticagrelor (BRILINTA) 90 MG TABS tablet Take 1 tablet (90 mg total) by mouth every 12 hours     . nitroglycerin (NITROSTAT) 0.4 MG SL tablet Place 1 tablet (0.4 mg total) under the tongue every 5 minutes as needed for Chest pain   May repeat 2 times then call 911 if pain persists.   . sertraline (ZOLOFT) 100 MG tablet Take 100 mg by mouth daily   TAKE 1 TABLET DAILY.        Allergies   Allergen Reactions   . Latex  Created by Conversion - 0;    . No Known Drug Allergy      Created by Conversion - 0;        Family History   Problem Relation Age of Onset   . Diabetes Mother    . High cholesterol Mother    . Heart disease Father    . Diabetes Father        History     Social History   . Marital Status: Married     Spouse Name: N/A     Number of Children: N/A   . Years of Education: N/A     Social History Main Topics   . Smoking status: Former Smoker -- 0.8 packs/day for 24 years     Types: Cigarettes     Quit date: 04/04/2011   . Smokeless tobacco: Former Neurosurgeon   . Alcohol Use: 0.0 oz/week     .5 drink(s) per week   . Drug Use: No   . Sexually Active: None     Other Topics Concern   . None     Social History Narrative   . None         Review of Systems:    General: No significant weight change, fatigue, fevers  Skin: No new lesions  Eyes:  Glasses  ENT: No nasal symptoms, hearing loss, sore throat  Cardiovascular: HPI  Pulmonary: HPI  GI: No nausea, vomiting, or change in bowel habits  GU: No dysuria or polyuria  Heme: No bleeding/bruisability   Endo: No DM, thyroid normal  Musculoskeletal: No new arthralgias/myalgias  Neuro: No headaches, seizures, stroke symptoms      PHYSICAL EXAM:  BP 96/54  Pulse 54  Ht 1.803 m (5\' 11" )  Wt 82.555 kg (182 lb)  BMI 25.38 kg/m2  General: alert, full, NAD  HEENT: anicteric, MMM, no E/E OP, conj pink, no arcus senilis   Neck: no JVD, bruits, or LAD  CV: RRR, ns1/s2, no murmurs, rubs, or gallops  Pulm: CTA B  Abd: soft, NT, ND, +BS  Ext: no edema or cyanosis, 2+ distal pulses  Neuro: no gross focal deficits  Skin: no visible lesions      Coronary Angiogram (04/04/11):  One vessel coronary artery disease (LAD). Mildly reduced left ventricular function. Successful percutaneous coronary intervention (bare metal stenting) of the left anterior descending artery. Succesful clot extraction form D2.     SPECT (04/11/11):  PERFUSION: Myocardial perfusion SPECT imaging demonstrates: (1) Slight vasodilator stress induced ischemia of the apex and apical anterior region involving 4% LV mass superimposed on (2) partial thickness infarction of this region extending to the mid-anterior wall involving 3% LV mass. (3) Consider benefits and risks of aggressive medical therapy, if clinically indicated.   FUNCTION: ECG-gated SPECT myocardial wall motion study shows: (1) Upper normal LV volume and low normal EF are noted with evidence of mild to moderate dysfunction of the apex, peri-apex and mild dysfunction of the anterolateral wall. (2) RV size and function appear normal. (3) No prior study is available for comparison.     VScan (04/25/11): Normal LVEF without evidence of regional dysfunction.  No pericardial thickening or effusion.  Right ventricle appears normal in size and function.    Assessment & Plan:   [name] is a 45  y.o. with recent anterior STEMI that was seen urgently today for recurrent chest pains.      1) Chest pains & Coronary artery disease: Although Octave's description of his symptoms is  atypical for coronary ischemia, the similarity to myocardial infarction presentation and dynamic nonspecific changes on electrocardiogram merit additional work-up on an emergent level.  We have arranged for him to transfer to the ED for telemetry, chest x-ray, and laboratory testing.  We will plan on continuing all medications and observing overnight.  Should biomarkers be elevated, will likely require repeat angiography.  If unremarkable, will try treatments for noncardiac chest pain including antacids, analgesics, and benzodiazepines.   - Continue aspirin, ticagrelor, lisinopril, metoprolol, and atorvastatin   - Telemetry, serial biomarkers, Chest x-ray   - SL NG as needed   - Defer additional workup at this time    3) Hypertension:  Systolic and diastolic blood pressure are at goal of 140/90 mmHg on current regimen.     - Continue current medications   - Low sodium diet (<4 g/day)   - Electrolytes within normal range (04/11/11)    4) Hyperlipidemia: LDL and non-HDL below goal of <70 and <096 mg/dL on recent blood work (04/54/09), prior to statin usage.    - Continue current atorvastatin dosing    - Obtain fasting lipid panel in 2-3 months        [name], MD  Cardiovascular Disease    Suggest follow-up pending work-up.  Thank you for allowing [place] to participate in this patient's care.  Please call or e-mail ([email or web address]) with any questions or concerns regarding his care.

## 2011-04-25 NOTE — ED Notes (Signed)
Bed:PA-01<BR> Expected date:<BR> Expected time:<BR> Means of arrival:<BR> Comments:<BR> [name] dob September 06, 1965  MR [id number]  Coming from cardiology apparent chest pain with ekg changes. Stent placed 1 month ago. For admit r.o MI

## 2011-04-25 NOTE — ED Notes (Signed)
Pt to ed from cards with ekg changes and chest pain, states pain began this afternoon at 12:00, denies sob, denies diaphoresis, pain radiated to the neck, + cardiac hx

## 2011-04-26 ENCOUNTER — Other Ambulatory Visit: Payer: Self-pay | Admitting: Gastroenterology

## 2011-04-26 LAB — CBC AND DIFFERENTIAL
Baso # K/uL: 0 10*3/uL (ref 0.0–0.1)
Basophil %: 0.2 % (ref 0.2–1.2)
Eos # K/uL: 0.1 10*3/uL (ref 0.0–0.5)
Eosinophil %: 2 % (ref 0.8–7.0)
Hematocrit: 41 % (ref 40–51)
Hemoglobin: 13.9 g/dL (ref 13.7–17.5)
Lymph # K/uL: 1.8 10*3/uL (ref 1.3–3.6)
Lymphocyte %: 29.5 % (ref 21.8–53.1)
MCV: 94 fL — ABNORMAL HIGH (ref 79–92)
Mono # K/uL: 0.5 10*3/uL (ref 0.3–0.8)
Monocyte %: 8.5 % (ref 5.3–12.2)
Neut # K/uL: 3.6 10*3/uL (ref 1.8–5.4)
Platelets: 268 10*3/uL (ref 150–330)
RBC: 4.4 MIL/uL — ABNORMAL LOW (ref 4.6–6.1)
RDW: 12.7 % (ref 11.6–14.4)
Seg Neut %: 59.6 % (ref 34.0–67.9)
WBC: 6 10*3/uL (ref 4.2–9.1)

## 2011-04-26 LAB — BASIC METABOLIC PANEL
Anion Gap: 13 (ref 7–16)
CO2: 26 mmol/L (ref 20–28)
Calcium: 8.9 mg/dL (ref 8.6–10.2)
Chloride: 104 mmol/L (ref 96–108)
Creatinine: 0.77 mg/dL (ref 0.67–1.17)
GFR,Black: >59 *
GFR,Caucasian: >59 *
Glucose: 105 mg/dL — ABNORMAL HIGH (ref 60–99)
Lab: 16 mg/dL (ref 6–20)
Potassium: 4.6 mmol/L (ref 3.3–5.1)
Sodium: 143 mmol/L (ref 133–145)

## 2011-04-26 LAB — TROPONIN T
Troponin T: <0.01 ng/mL (ref 0.00–0.02)
Troponin T: <0.01 ng/mL (ref 0.00–0.02)

## 2011-04-26 MED ORDER — ASPIRIN 81 MG PO CHEW *I*
81.0000 mg | CHEWABLE_TABLET | Freq: Every day | ORAL | Status: DC
Start: 2011-04-26 — End: 2011-04-26
  Administered 2011-04-26: 81 mg via ORAL
  Filled 2011-04-26: qty 1

## 2011-04-26 NOTE — Discharge Instructions (Signed)
Chest Pain - Nonspecific     Today you have had an exam and tests to determine a specific cause for your chest pain. It is often hard to give a specific diagnosis as the cause of one’s chest pain.  You need to follow up with your caregiver for further evaluation.      Most of the time nonspecific chest pain will be improved within 2-3 days of rest and mild pain medicine.  For the next few days avoid physical exertion or activities that bring on the pain. Call your caregiver for routine follow-up as advised.      SEEK IMMEDIATE MEDICAL CARE IF YOU DEVELOP:    Ø Increased chest pain, or pain that radiates to the arm, neck, jaw, back or abdomen.  Ø Shortness of breath, increasing cough or coughing up blood.  Ø Severe back or abdominal pain, nausea or vomiting.  Ø Severe weakness, fainting, fever or chills.

## 2011-04-26 NOTE — ED Notes (Signed)
Evening dose of metoprolol held due to HR = 56 and BP 106/62. Per Jana Hakim, NP, hold evening dose of metoprolol. Will continue to monitor.

## 2011-04-26 NOTE — Progress Notes (Signed)
Cardiology Progress Note    Subjective:  Interval History: Metoprolol held for asymptomatic bradycardia overnight.    Patient no chest pains.  Palpitation last night that was brief and did not trigger telemetry alarm.     Scheduled Meds:     . aspirin  81 mg Oral Daily   . atorvastatin  40 mg Oral QPM   . lisinopril  5 mg Oral Daily   . metoprolol  25 mg Oral BID   . Ticagrelor  90 mg Oral Q12H   . sertraline  100 mg Oral Daily     Continuous Infusions:     . morphine       PRN Meds:acetaminophen, morphine, nitroglycerin    Objective:    Vital signs in last 24 hours:  Temp:  [36.2 C (97.2 F)-36.8 C (98.2 F)] 36.2 C (97.2 F)  Heart Rate:  [46-57] 55   Resp:  [16-18] 18   BP: (96-128)/(54-80) 128/80 mmHg       General: alert, full, NAD   HEENT: anicteric, MMM, no E/E OP, conj pink, no arcus senilis   Neck: no JVD, bruits, or LAD   CV: RRR, ns1/s2, no murmurs, rubs, or gallops   Pulm: CTA B   Abd: soft, NT, ND, +BS   Ext: no edema or cyanosis, 2+ distal pulses   Skin: multiple tattoos     Telemetry:  Sinus bradycardia; no ventricular ectopy or tachyarrhythmia     Recent Results (from the past 24 hour(s))   CBC AND DIFFERENTIAL    Collection Time    04/25/11  4:34 PM       Component Value Range    WBC 5.9  4.2 - 9.1 THOU/uL    RBC 4.2 (*) 4.6 - 6.1 MIL/uL    Hemoglobin 13.5 (*) 13.7 - 17.5 g/dL    Hematocrit 39 (*) 40 - 51 %    MCV 94 (*) 79 - 92 fL    RDW 12.8  11.6 - 14.4 %    Platelets 269  150 - 330 THOU/uL    Seg Neut % 51.3  34.0 - 67.9 %    Lymphocyte % 36.7  21.8 - 53.1 %    Monocyte % 9.9  5.3 - 12.2 %    Eosinophil % 1.7  0.8 - 7.0 %    Basophil % 0.2  0.2 - 1.2 %    Neut # K/uL 3.0  1.8 - 5.4 THOU/uL    Lymph # K/uL 2.2  1.3 - 3.6 THOU/uL    Mono # K/uL 0.6  0.3 - 0.8 THOU/uL    Eos # K/uL 0.1  0.0 - 0.5 THOU/uL    Baso # K/uL 0.0  0.0 - 0.1 THOU/uL   BASIC METABOLIC PANEL    Collection Time    04/25/11  4:34 PM       Component Value Range    Glucose 95  60 - 99 mg/dL    Sodium 960  454 - 098 mmol/L     Potassium 5.0  3.3 - 5.1 mmol/L    Chloride 101  96 - 108 mmol/L    CO2 26  20 - 28 mmol/L    Anion Gap 12  7 - 16    UN 16  6 - 20 mg/dL    Creatinine 1.19  1.47 - 1.17 mg/dL    GFR,Caucasian > 59      GFR,Black > 59      Calcium 8.7  8.6 -  10.2 mg/dL   TROPONIN T    Collection Time    04/25/11  4:34 PM       Component Value Range    Troponin T < 0.01  0.00 - 0.02 ng/mL   HOLD BLUE    Collection Time    04/25/11  4:34 PM       Component Value Range    Hold Blue HOLD TUBE     HOLD RED    Collection Time    04/25/11  4:34 PM       Component Value Range    Hold Red HOLD TUBE     BLOOD BANK HOLD LAVENDER    Collection Time    04/25/11  4:34 PM       Component Value Range    Bld Bank Hld Lav Lav In Bld Bank     CK ISOENZYMES    Collection Time    04/25/11  4:34 PM       Component Value Range    CK 128  46 - 171 U/L    Mass CKMB 1.6  0.0 - 4.9 ng/mL   TROPONIN T    Collection Time    04/26/11 12:28 AM       Component Value Range    Troponin T < 0.01  0.00 - 0.02 ng/mL   BASIC METABOLIC PANEL    Collection Time    04/26/11  6:05 AM       Component Value Range    Glucose 105 (*) 60 - 99 mg/dL    Sodium 161  096 - 045 mmol/L    Potassium 4.6  3.3 - 5.1 mmol/L    Chloride 104  96 - 108 mmol/L    CO2 26  20 - 28 mmol/L    Anion Gap 13  7 - 16    UN 16  6 - 20 mg/dL    Creatinine 4.09  8.11 - 1.17 mg/dL    GFR,Caucasian > 59      GFR,Black > 59      Calcium 8.9  8.6 - 10.2 mg/dL   CBC AND DIFFERENTIAL    Collection Time    04/26/11  6:05 AM       Component Value Range    WBC 6.0  4.2 - 9.1 THOU/uL    RBC 4.4 (*) 4.6 - 6.1 MIL/uL    Hemoglobin 13.9  13.7 - 17.5 g/dL    Hematocrit 41  40 - 51 %    MCV 94 (*) 79 - 92 fL    RDW 12.7  11.6 - 14.4 %    Platelets 268  150 - 330 THOU/uL    Seg Neut % 59.6  34.0 - 67.9 %    Lymphocyte % 29.5  21.8 - 53.1 %    Monocyte % 8.5  5.3 - 12.2 %    Eosinophil % 2.0  0.8 - 7.0 %    Basophil % 0.2  0.2 - 1.2 %    Neut # K/uL 3.6  1.8 - 5.4 THOU/uL    Lymph # K/uL 1.8  1.3 - 3.6 THOU/uL    Mono # K/uL  0.5  0.3 - 0.8 THOU/uL    Eos # K/uL 0.1  0.0 - 0.5 THOU/uL    Baso # K/uL 0.0  0.0 - 0.1 THOU/uL   TROPONIN T    Collection Time    04/26/11  6:05 AM       Component Value Range  Troponin T < 0.01  0.00 - 0.02 ng/mL         Assessment/Plan:  [name] is a 45 year old with recent anterior STEMI that was admitted to the observation unit overnight with chest pains.    1) Chest pains & Coronary artery disease: I am reassured by the absence of myocardial injury on blood work.  If his discomfort was from ischemia, that duration would have been expected to increase troponins.  Given his recent stress test findings and atypical presentation, will defer any additional work-up.  Will treat empirically for musculoskeletal or pleuritic component with NSAIDs.  He can resume cardiac rehabilitation as planned and will follow-up in our office as scheduled.  Continue BB with mild asymptomatic bradycardia.    - No medication changes   - Repeat ECG this morning     - SL NG as needed    - Defer additional workup at this time     2) Hypertension: Systolic and diastolic blood pressure are at goal of 140/90 mmHg on current regimen.    - Continue current medications    - Low sodium diet (<4 g/day)    - Electrolytes within normal range (04/26/11)     3) Hyperlipidemia: LDL and non-HDL well above goal of <70 and <100 mg/dL on recent blood work (16/10/96), prior to statin usage.    - Continue current atorvastatin dosing    - Obtain fasting lipid panel in 2-3 months      Author: [name], MD  Note created: 04/26/2011  at: 9:30 AM

## 2011-04-26 NOTE — ED Obs Notes (Signed)
ED OBSERVATION FOLLOW-UP NOTE    Patient:  [name]                            Subjective: Had some episodes of mild sharp chest pain overnight when taking a deep breath. This has resolved. Feels fine this morning.    Intake/Output  No intake or output data in the 24 hours ending 04/26/11 0936     Vital Signs:   Temp:  [36.2 C (97.2 F)-36.8 C (98.2 F)] 36.2 C (97.2 F)  Heart Rate:  [46-57] 55   Resp:  [16-18] 18   BP: (106-128)/(56-80) 128/80 mmHg     Lungs: Auscultation of the lungs revealed normal breath sounds without any other adventitious sounds or rubs.    Cardiovascular: There was a regular rate and rhythm without any murmurs, gallops, rubs.     Abdomen: Soft and nontender with normal bowel sounds.     Neurologic: Alert and oriented x 3. Normal affect.       Data:                Lab 04/26/11 0605 04/25/11 1634   WBC 6.0 5.9   HGB 13.9 13.5*   HCT 41 39*   PLT 268 269   INR -- --       Lab 04/26/11 0605 04/26/11 0028 04/25/11 1634   CKTS -- -- --   TROPU -- -- --   TROP < 0.01 < 0.01 < 0.01   MCKMB -- -- 1.6   CKMB -- -- --               Lab 04/26/11 0605 04/25/11 1634   NA 143 139   K 4.6 5.0   CL 104 101   CO2 26 26   UN 16 16   CREAT 0.77 0.71   GLU 105* 95   CA 8.9 8.7            X-rays the past 24 hours: * Portable Chest Standard Ap Single View    04/25/2011  IMPRESSION: No acute pulmonary infiltrate.          Active Meds:       . aspirin  81 mg Oral Daily   . atorvastatin  40 mg Oral QPM   . lisinopril  5 mg Oral Daily   . metoprolol  25 mg Oral BID   . Ticagrelor  90 mg Oral Q12H   . sertraline  100 mg Oral Daily     There are no hospital problems to display for this patient.      Assessment and Plan: This is a case of chest discomfort in a 45 y.o. male who had a recent STEMI followed by LAD BMS. His EKG yesterday had some changes that appeared to be new compared to interval tracing. His troponin results have been normal. Dr Audery Amel has reviewed this morning - advises he can go home with no  change in medications. EKG this morning looks improved (closer to interval tracing). Smoking cessation discussed (he hasn't smoked since MI). He will call MD for further episodes of chest pain.         Author: Kirkland Hun, MD  as of: 04/26/2011  at: 9:36 AM        Author: Kirkland Hun, MD  Note created: 04/26/2011  at: 9:36 AM

## 2011-04-27 NOTE — Progress Notes (Signed)
S:  [name] presents to the office with complaints of left ankle pain following an injury that occurred at 7:30 a.m. this morning.    He reports that he was walking at the end of his driveway when his left foot went into a hole and he rolled his left ankle.  He felt a snapping sensation and experienced immediate pain.  He was able to continue to walk on the foot with some associated discomfort.  A short time later he began to notice swelling.  He has been applying cold packs and elevating the foot until he was able to come in for this appointment.  He denies any pain in his low back, hips, buttocks, knees, right ankle or toes.  He denies any numbness, tingling or weakness in his legs or feet.  He has not taken anything to treat his symptoms so far.  He has never injured this ankle in the past.    He continues with his usual medications.    O:  General: Alert, appropriate, pleasant man in no apparent distress.  Neck: Supple, no lymphadenopathy, no thyromegaly, 2+ carotid pulses bilaterally.  Heart: Regular rate and rhythm, no murmur.  Lungs: Clear to auscultation bilaterally.  Abdomen: Positive bowel sounds, soft, nontender, nondistended, no masses, no organomegaly.  Extremities: 2+ radial and 2+ posterior tibial pulses bilaterally.  No clubbing, cyanosis or edema.  MSK: There is mild amount of edema surrounding the left lateral malleolus.  There is moderate tenderness along to inferior and distal margins of the malleolus.  Moderate discomfort with range of motion of the left ankle.  No crepitance or instability.  No tenderness with compression of the malleoli.  No tenderness along the Achilles tendon or on the proximal head of the 5th metatarsal.  Neuro: No sensory or motor deficits of the lower extremities.  Brisk, uniform patellar tendon and Achilles tendon reflexes bilaterally.  5/5 muscle strength in the flexors and extensors of the hips, knees and ankles bilaterally.    A:  Left ankle pain -- consistent  with a sprain    P:  1.  Refer for stat x-rays of the left ankle to rule out fracture.  Will notify patient of the results later today and advise him appropriately.  2.  Recommended rest, ice, compression and elevation for management of symptoms.  3.  Avoid stress or strain to the left ankle while symptoms persist.  4.  May take Tylenol or Advil as needed for management of symptoms.  5.  Patient was given a seasonal influenza immunization in the office today.  6.  RTO in 1 week as already scheduled or sooner if any other problems or concerns.

## 2011-05-04 NOTE — Progress Notes (Signed)
S:  [name] presents to the office for follow up of his recent hospitalization for a heart attack.    He presented to the office 4 days ago with complaints of severe chest pressure.  His EKG identified clinically significant ST elevation so he was transported by EMS to the [facility] ED.  He underwent an urgent cardiac catheterization which included a thrombectomy and stent placement in the LAD.  He underwent electrical defibrillation twice during transport to the ED and once prior to his catheterization.    He was initiated on atorvastatin, metoprolol, lisinopril, aspirin, Brilinta and sublingual nitroglycerin as needed.  His lovastatin was discontinued. He continues with sertraline.  He is scheduled to follow up with cardiologist, Dr. Audery Amel on 04/15/11.    He reports that he has had no further cigarettes since the day of the event.  He denies any problems with headaches, dizziness, vision changes, palpitations, shortness of breath, heartburn, nausea, diarrhea, constipation or fatigue.  He is aware of some aching discomfort in his chest that he believes is a product of the electrical defibrillation that he sustained.  Symptoms are present continuously and are not associated with exertion.    O:  General: Alert, appropriate, pleasant man in no apparent distress.  HEENT: Sclera and conjunctiva clear, TMs WNL, MMM, oropharynx negative.  Neck: Supple, no lymphadenopathy, no thyromegaly, 2+ carotid pulses bilaterally.  Heart: Regular rate and rhythm, no murmur.  Lungs: Clear to auscultation bilaterally.  Abdomen: Positive bowel sounds, soft, nontender, nondistended, no masses, no organomegaly.  Extremities: 2+ radial and 2+ posterior tibial pulses bilaterally.  No clubbing, cyanosis or edema.  The catheterization insertion site on the right forearm has evidence of trace ecchymosis and mild tenderness but no erythema, edema, induration or warmth.    A:  1.  CAD -- recovering appropriately after recent myocardial  infarction  2.  Essential hypertension -- well controlled  3.  Hypercholesterolemia -- appropriately treated    P:  1.  Continue with current medications at their current doses.  2.  Follow up with cardiologist, Dr. Audery Amel as scheduled.  3.  Commended patient on his success with smoking cessation and urged him to remain committed to this plan.  4.  RTO in 6 weeks for follow up of low back pain or sooner if any other problems or concerns.  5.  Complete blood work in 3 months to check an FLP and CMP.

## 2011-05-05 LAB — EKG 12-LEAD
P: 40 degrees
P: 59 degrees
QRS: 39 degrees
QRS: 43 degrees
Rate: 52 {beats}/min
Rate: 48 {beats}/min
Severity: ABNORMAL
Severity: ABNORMAL
Severity: ABNORMAL
Severity: ABNORMAL
Statement: ABNORMAL
Statement: BORDERLINE
Statement: ABNORMAL
Statement: BORDERLINE
T: 113 degrees
T: 107 degrees

## 2011-05-13 ENCOUNTER — Telehealth: Payer: Self-pay | Admitting: Allergy and Immunology

## 2011-05-13 MED ORDER — METOPROLOL TARTRATE 25 MG PO TABS *I*
12.5000 mg | ORAL_TABLET | Freq: Two times a day (BID) | ORAL | Status: DC
Start: 2011-05-13 — End: 2011-05-26

## 2011-05-13 NOTE — Telephone Encounter (Signed)
Per Eddie Candle, decrease metoprolol to 12.5 mg BID.  Tried to contact pt no answer, no voicemail.  LM with RN at cardiac rehab to tell him to decrease.

## 2011-05-17 NOTE — Progress Notes (Signed)
S:  [name] returns to the office for follow up of his hypertension, hypercholesterolemia and CAD.    He continues with his usual medications.  He continues to follow up with cardiologist, Dr. Audery Amel.  He reports that he experienced recurrent chest pain on 04/10/11.  He went to the [facility] ED for evaluation.  Blood work and EKGs did not detect evidence of myocardial infarction.  He underwent a nuclear stress test which was negative for evidence of cardiac ischemia.  He was discharged to home the following day.    He reports that he continues to experience intermittent aching discomfort in his chest.  He notes that he finds his symptoms to be difficult to interpret.  He is told to call the doctor or go to the emergency room if he has chest pain and yet he experiences intermittent symptoms of chest discomfort several times a day.  He does not feel that he can tell which symptoms are negligible and which are important.  His symptoms are not associated with exertion.  He denies any associated palpitations, shortness of breath or sweats.  He realizes that he received some jolts of electricity as a result of the defibrillation that he received on at least 3 occasions.  He has considered that his pains might be a product of this and not a sign of recurrent heart pain.  He also does not know when to take his sublingual nitroglycerin for the same reason.  His chest pain will feel like a stabbing sensation or tightness.  Symptoms typically last anywhere from a few seconds to a few minutes in duration.    He denies any problems with headaches, dizziness, vision changes, heartburn, nausea, diarrhea, constipation, bleeding problems or fatigue.  He has remained off of the cigarettes.    O:  General: Alert, appropriate, pleasant man in no apparent distress.  Neck: Supple, no lymphadenopathy, no thyromegaly, 2+ carotid pulses bilaterally.  Heart: Regular rate and rhythm, no murmur.  Lungs: Clear to auscultation  bilaterally.  Abdomen: Positive bowel sounds, soft, nontender, nondistended, no masses, no organomegaly.  Extremities: 2+ radial and 2+ posterior tibial pulses bilaterally.  No clubbing, cyanosis or edema.  MSK: There is no reproducible pain with palpation over the anterior chest.    See blood work from 04/05/11.    A:  1.  Essential hypertension -- well controlled  2.  Hypercholesterolemia -- well controlled  3.  CAD -- hemodynamically stable    P:  1.  Continue with current medications at their current doses.  2.  Continue to maintain efforts on prudent diet, exercise and weight management.  3.  Continue to remain off of the cigarettes.  4.  Continue to remain off from work.  5.  Continue appropriate follow up with cardiologist, Dr. Audery Amel.  Recommended that he discuss his recurrent chest discomfort with Dr. Audery Amel.  6.  RTO in 1 month as already scheduled or sooner if any other problems or concerns.

## 2011-05-19 ENCOUNTER — Other Ambulatory Visit: Payer: Self-pay | Admitting: Primary Care

## 2011-05-21 ENCOUNTER — Encounter: Payer: Self-pay | Admitting: Primary Care

## 2011-05-21 ENCOUNTER — Telehealth: Payer: Self-pay | Admitting: Cardiology

## 2011-05-21 ENCOUNTER — Other Ambulatory Visit: Payer: Self-pay | Admitting: Primary Care

## 2011-05-21 ENCOUNTER — Ambulatory Visit: Payer: Self-pay | Admitting: Primary Care

## 2011-05-21 VITALS — BP 100/58 | HR 58 | Ht 71.0 in | Wt 186.0 lb

## 2011-05-21 DIAGNOSIS — E7800 Pure hypercholesterolemia, unspecified: Secondary | ICD-10-CM

## 2011-05-21 DIAGNOSIS — I2510 Atherosclerotic heart disease of native coronary artery without angina pectoris: Secondary | ICD-10-CM

## 2011-05-21 DIAGNOSIS — I10 Essential (primary) hypertension: Secondary | ICD-10-CM

## 2011-05-21 DIAGNOSIS — M545 Low back pain, unspecified: Secondary | ICD-10-CM

## 2011-05-26 ENCOUNTER — Ambulatory Visit: Payer: Self-pay | Admitting: Cardiology

## 2011-05-26 ENCOUNTER — Other Ambulatory Visit: Payer: Self-pay | Admitting: Gastroenterology

## 2011-05-26 ENCOUNTER — Encounter: Payer: Self-pay | Admitting: Cardiology

## 2011-05-26 ENCOUNTER — Ambulatory Visit
Admit: 2011-05-26 | Discharge: 2011-05-26 | Disposition: A | Discharge status: Home / Self Care | Payer: Self-pay | Source: Ambulatory Visit | Attending: Cardiology | Admitting: Cardiology

## 2011-05-26 VITALS — BP 102/60 | HR 54 | Ht 72.0 in | Wt 187.0 lb

## 2011-05-26 DIAGNOSIS — I10 Essential (primary) hypertension: Secondary | ICD-10-CM

## 2011-05-26 DIAGNOSIS — I219 Acute myocardial infarction, unspecified: Secondary | ICD-10-CM

## 2011-05-26 DIAGNOSIS — I2510 Atherosclerotic heart disease of native coronary artery without angina pectoris: Secondary | ICD-10-CM

## 2011-05-26 DIAGNOSIS — E785 Hyperlipidemia, unspecified: Secondary | ICD-10-CM

## 2011-05-26 MED ORDER — OMEPRAZOLE 20 MG PO CPDR *I*
20.0000 mg | DELAYED_RELEASE_CAPSULE | Freq: Every day | ORAL | Status: AC
Start: 2011-05-26 — End: 2011-06-25

## 2011-05-28 ENCOUNTER — Other Ambulatory Visit: Payer: Self-pay | Admitting: Primary Care

## 2011-06-03 ENCOUNTER — Other Ambulatory Visit: Payer: Self-pay | Admitting: Primary Care

## 2011-06-05 ENCOUNTER — Ambulatory Visit: Payer: Self-pay

## 2011-06-16 ENCOUNTER — Ambulatory Visit: Payer: Self-pay

## 2011-06-24 ENCOUNTER — Other Ambulatory Visit: Payer: Self-pay | Admitting: Gastroenterology

## 2011-06-24 ENCOUNTER — Emergency Department
Admission: EM | Admit: 2011-06-24 | Disposition: A | Discharge status: Home / Self Care | Payer: Self-pay | Source: Ambulatory Visit | Attending: Emergency Medicine | Admitting: Emergency Medicine

## 2011-06-24 ENCOUNTER — Telehealth: Payer: Self-pay

## 2011-06-24 ENCOUNTER — Encounter: Payer: Self-pay | Admitting: Emergency Medicine

## 2011-06-24 LAB — CBC
Hematocrit: 43 % (ref 40–51)
Hemoglobin: 15 g/dL (ref 13.7–17.5)
MCV: 93 fL — ABNORMAL HIGH (ref 79–92)
Platelets: 306 10*3/uL (ref 150–330)
RBC: 4.6 MIL/uL (ref 4.6–6.1)
RDW: 12.7 % (ref 11.6–14.4)
WBC: 5.6 10*3/uL (ref 4.2–9.1)

## 2011-06-24 LAB — PLASMA PROF 7 (ED ONLY)
Anion Gap,PL: 12 (ref 7–16)
CO2,Plasma: 25 mmol/L (ref 20–28)
Chloride,Plasma: 101 mmol/L (ref 96–108)
Creatinine: 0.84 mg/dL (ref 0.67–1.17)
GFR,Black: >59 *
GFR,Caucasian: >59 *
Glucose,Plasma: 96 mg/dL (ref 60–99)
Potassium,Plasma: 4.5 mmol/L (ref 3.4–4.7)
Sodium,Plasma: 138 mmol/L (ref 132–146)
UN,Plasma: 15 mg/dL (ref 6–20)

## 2011-06-24 LAB — HOLD SST

## 2011-06-24 LAB — DATE/TIME NOT PROVIDED

## 2011-06-24 LAB — TROPONIN T
Troponin T: <0.01 ng/mL (ref 0.00–0.02)
Troponin T: <0.01 ng/mL (ref 0.00–0.02)

## 2011-06-24 MED ORDER — NITROGLYCERIN 2 % TD OINT *I*
1.0000 [in_us] | TOPICAL_OINTMENT | Freq: Once | TRANSDERMAL | Status: DC
Start: 2011-06-24 — End: 2011-06-25

## 2011-06-25 LAB — EKG 12-LEAD
P: 53 degrees
QRS: 28 degrees
Rate: 67 {beats}/min
Severity: NORMAL
Severity: NORMAL
T: 83 degrees

## 2011-07-01 ENCOUNTER — Ambulatory Visit: Payer: Self-pay

## 2011-07-02 ENCOUNTER — Ambulatory Visit
Admit: 2011-07-02 | Discharge: 2011-07-02 | Disposition: A | Discharge status: Home / Self Care | Payer: Self-pay | Source: Ambulatory Visit | Attending: Primary Care | Admitting: Primary Care

## 2011-07-02 DIAGNOSIS — I10 Essential (primary) hypertension: Secondary | ICD-10-CM

## 2011-07-02 DIAGNOSIS — E7800 Pure hypercholesterolemia, unspecified: Secondary | ICD-10-CM

## 2011-07-02 DIAGNOSIS — I2510 Atherosclerotic heart disease of native coronary artery without angina pectoris: Secondary | ICD-10-CM

## 2011-07-02 LAB — COMPREHENSIVE METABOLIC PANEL
ALT: 35 U/L (ref 0–50)
AST: 29 U/L (ref 0–50)
Albumin: 4.8 g/dL (ref 3.5–5.2)
Alk Phos: 81 U/L (ref 40–130)
Anion Gap: 12 (ref 7–16)
Bilirubin,Total: 0.3 mg/dL (ref 0.0–1.2)
CO2: 27 mmol/L (ref 20–28)
Calcium: 9.3 mg/dL (ref 8.6–10.2)
Chloride: 103 mmol/L (ref 96–108)
Creatinine: 0.8 mg/dL (ref 0.67–1.17)
GFR,Black: >59 *
GFR,Caucasian: >59 *
Glucose: 99 mg/dL (ref 60–99)
Lab: 14 mg/dL (ref 6–20)
Potassium: 4.5 mmol/L (ref 3.3–5.1)
Sodium: 142 mmol/L (ref 133–145)
Total Protein: 6.9 g/dL (ref 6.3–7.7)

## 2011-07-02 LAB — LIPID PANEL
Chol/HDL Ratio: 3.3
Cholesterol: 123 mg/dL
HDL: 37 mg/dL
LDL Calculated: 55 mg/dL
Non HDL Cholesterol: 86 mg/dL
Triglycerides: 155 mg/dL — AB

## 2011-07-07 ENCOUNTER — Encounter: Payer: Self-pay | Admitting: Cardiology

## 2011-07-07 ENCOUNTER — Ambulatory Visit
Admit: 2011-07-07 | Discharge: 2011-07-07 | Disposition: A | Discharge status: Home / Self Care | Payer: Self-pay | Source: Ambulatory Visit | Attending: Cardiology | Admitting: Cardiology

## 2011-07-07 ENCOUNTER — Ambulatory Visit: Payer: Self-pay | Admitting: Cardiology

## 2011-07-07 VITALS — BP 98/60 | HR 72 | Ht 72.0 in | Wt 185.0 lb

## 2011-07-15 ENCOUNTER — Ambulatory Visit: Payer: Self-pay | Admitting: Cardiology

## 2011-07-16 ENCOUNTER — Other Ambulatory Visit: Payer: Self-pay | Admitting: Primary Care

## 2011-07-16 ENCOUNTER — Other Ambulatory Visit: Payer: Self-pay | Admitting: Allergy and Immunology

## 2011-07-16 MED ORDER — LISINOPRIL 5 MG PO TABS *I*
5.0000 mg | ORAL_TABLET | Freq: Every day | ORAL | Status: AC
Start: 2011-07-16 — End: 2012-01-12

## 2011-07-16 MED ORDER — ATORVASTATIN CALCIUM 40 MG PO TABS *I*
40.0000 mg | ORAL_TABLET | Freq: Every evening | ORAL | Status: AC
Start: 2011-07-16 — End: 2012-01-12

## 2011-07-18 ENCOUNTER — Encounter: Payer: Self-pay | Admitting: Primary Care

## 2011-07-18 ENCOUNTER — Ambulatory Visit: Payer: Self-pay | Admitting: Primary Care

## 2011-07-18 VITALS — BP 102/80 | Ht 72.0 in | Wt 188.0 lb

## 2011-07-18 DIAGNOSIS — F419 Anxiety disorder, unspecified: Secondary | ICD-10-CM

## 2011-07-18 DIAGNOSIS — I2510 Atherosclerotic heart disease of native coronary artery without angina pectoris: Secondary | ICD-10-CM

## 2011-07-18 DIAGNOSIS — E7800 Pure hypercholesterolemia, unspecified: Secondary | ICD-10-CM

## 2011-07-18 DIAGNOSIS — M545 Low back pain, unspecified: Secondary | ICD-10-CM

## 2011-07-18 DIAGNOSIS — I10 Essential (primary) hypertension: Secondary | ICD-10-CM

## 2011-07-18 MED ORDER — SERTRALINE HCL 100 MG PO TABS *I*
ORAL_TABLET | ORAL | Status: DC
Start: 2011-07-18 — End: 2011-07-21

## 2011-07-20 ENCOUNTER — Other Ambulatory Visit: Payer: Self-pay | Admitting: Primary Care

## 2011-07-21 ENCOUNTER — Telehealth: Payer: Self-pay | Admitting: Cardiology

## 2011-07-21 ENCOUNTER — Other Ambulatory Visit: Payer: Self-pay | Admitting: Gastroenterology

## 2011-07-21 ENCOUNTER — Encounter: Payer: Self-pay | Admitting: Emergency Medicine

## 2011-07-21 ENCOUNTER — Observation Stay
Admission: EM | Admit: 2011-07-21 | Disposition: A | Discharge status: Home / Self Care | Payer: Self-pay | Source: Ambulatory Visit | Attending: Geriatric Medicine | Admitting: Geriatric Medicine

## 2011-07-21 LAB — CBC AND DIFFERENTIAL
Baso # K/uL: 0 10*3/uL (ref 0.0–0.1)
Basophil %: 0.5 % (ref 0.2–1.2)
Eos # K/uL: 0.2 10*3/uL (ref 0.0–0.5)
Eosinophil %: 2.8 % (ref 0.8–7.0)
Hematocrit: 42 % (ref 40–51)
Hemoglobin: 14 g/dL (ref 13.7–17.5)
Lymph # K/uL: 1.9 10*3/uL (ref 1.3–3.6)
Lymphocyte %: 32.1 % (ref 21.8–53.1)
MCV: 93 fL — ABNORMAL HIGH (ref 79–92)
Mono # K/uL: 0.5 10*3/uL (ref 0.3–0.8)
Monocyte %: 8.8 % (ref 5.3–12.2)
Neut # K/uL: 3.4 10*3/uL (ref 1.8–5.4)
Platelets: 283 10*3/uL (ref 150–330)
RBC: 4.5 MIL/uL — ABNORMAL LOW (ref 4.6–6.1)
RDW: 12.5 % (ref 11.6–14.4)
Seg Neut %: 55.8 % (ref 34.0–67.9)
WBC: 6.1 10*3/uL (ref 4.2–9.1)

## 2011-07-21 LAB — PLASMA PROF 7 (ED ONLY)
Anion Gap,PL: 9 (ref 7–16)
CO2,Plasma: 27 mmol/L (ref 20–28)
Chloride,Plasma: 105 mmol/L (ref 96–108)
Creatinine: 0.85 mg/dL (ref 0.67–1.17)
GFR,Black: >59 *
GFR,Caucasian: >59 *
Glucose,Plasma: 93 mg/dL (ref 60–99)
Potassium,Plasma: 3.9 mmol/L (ref 3.4–4.7)
Sodium,Plasma: 141 mmol/L (ref 132–146)
UN,Plasma: 15 mg/dL (ref 6–20)

## 2011-07-21 LAB — CK ISOENZYMES
CK: 146 U/L (ref 46–171)
Mass CKMB: 1.8 ng/mL (ref 0.0–4.9)

## 2011-07-21 LAB — PROTIME-INR
INR: 1.2 (ref 1.0–1.2)
Protime: 12 s (ref 9.2–12.3)

## 2011-07-21 LAB — APTT: aPTT: 31 s (ref 25.8–37.9)

## 2011-07-21 LAB — TROPONIN T: Troponin T: <0.01 ng/mL (ref 0.00–0.02)

## 2011-07-21 MED ORDER — PANTOPRAZOLE SODIUM 40 MG PO TBEC *I*
40.0000 mg | DELAYED_RELEASE_TABLET | Freq: Every morning | ORAL | Status: DC
Start: 2011-07-22 — End: 2011-07-23
  Filled 2011-07-21: qty 1

## 2011-07-21 MED ORDER — TICAGRELOR 90 MG PO TABS *I*
90.0000 mg | ORAL_TABLET | Freq: Two times a day (BID) | ORAL | Status: DC
Start: 2011-07-21 — End: 2011-07-23
  Administered 2011-07-21 – 2011-07-22 (×2): 90 mg via ORAL
  Filled 2011-07-21 (×2): qty 1

## 2011-07-21 MED ORDER — HYDROMORPHONE HCL PF 1 MG/ML IJ SOLN *WRAPPED*
1.0000 mg | Freq: Once | INTRAMUSCULAR | Status: AC
Start: 2011-07-21 — End: 2011-07-21
  Administered 2011-07-21: 1 mg via INTRAVENOUS
  Filled 2011-07-21: qty 1

## 2011-07-21 MED ORDER — FAMOTIDINE 20 MG PO TABS *I*
20.0000 mg | ORAL_TABLET | Freq: Two times a day (BID) | ORAL | Status: DC
Start: 2011-07-21 — End: 2011-07-23
  Administered 2011-07-21: 20 mg via ORAL
  Filled 2011-07-21 (×2): qty 1

## 2011-07-21 MED ORDER — ACETAMINOPHEN 325 MG PO TABS *I*
650.0000 mg | ORAL_TABLET | ORAL | Status: DC | PRN
Start: 2011-07-21 — End: 2011-07-23

## 2011-07-21 MED ORDER — ASPIRIN 81 MG PO TBEC *I*
81.0000 mg | DELAYED_RELEASE_TABLET | Freq: Every day | ORAL | Status: DC
Start: 2011-07-22 — End: 2011-07-23
  Administered 2011-07-22: 81 mg via ORAL
  Filled 2011-07-21: qty 1

## 2011-07-21 MED ORDER — HYDROCODONE-ACETAMINOPHEN 5-325 MG PO TABS *I*
2.0000 | ORAL_TABLET | Freq: Four times a day (QID) | ORAL | Status: DC | PRN
Start: 2011-07-21 — End: 2011-07-23
  Filled 2011-07-21: qty 2

## 2011-07-21 MED ORDER — LISINOPRIL 10 MG PO TABS *I*
5.0000 mg | ORAL_TABLET | Freq: Every day | ORAL | Status: DC
Start: 2011-07-22 — End: 2011-07-23
  Filled 2011-07-21: qty 1

## 2011-07-21 MED ORDER — SERTRALINE HCL 100 MG PO TABS *I*
200.0000 mg | ORAL_TABLET | Freq: Every evening | ORAL | Status: DC
Start: 2011-07-21 — End: 2011-07-23
  Administered 2011-07-21: 200 mg via ORAL
  Filled 2011-07-21: qty 4

## 2011-07-21 MED ORDER — ATORVASTATIN CALCIUM 40 MG PO TABS *I*
40.0000 mg | ORAL_TABLET | Freq: Every evening | ORAL | Status: DC
Start: 2011-07-21 — End: 2011-07-23
  Administered 2011-07-21: 40 mg via ORAL
  Filled 2011-07-21: qty 1

## 2011-07-21 MED ORDER — MORPHINE SULFATE 4 MG/ML IJ SOLN
4.0000 mg | INTRAMUSCULAR | Status: DC | PRN
Start: 2011-07-21 — End: 2011-07-23

## 2011-07-21 NOTE — ED Notes (Signed)
Assumed care of pt at this time.  Pt states symptoms have now resolved and pt would like to go home.  Will continue to monitor and treat per provider orders.

## 2011-07-21 NOTE — Telephone Encounter (Signed)
Spoke to pt, he has been having chest pain all day.  He described that this morning he was shopping with his wife and experienced chest pressure.  He again this afternoon experienced chest pressure while exercising and took 2 nitroglycerin and achieved relief.  He called our office at [phone] to let us know that he was starting to have chest pain again at rest.  I spoke with Dr. Audery Amel who suggested based on his symptoms that he be evaluated at either the nearest emergency room or at [facility] as he may require cardiac catheterization.  The patient is more comfortable going to [facility] and will leave for [facility] now.  The patient also told me that his heart rate had been elevated while he was exercising 140 beats per minute and that currently his heart rate is 60 beats per minute.

## 2011-07-21 NOTE — ED Notes (Signed)
Pt resting comfortably at this time, no complaints. Denies pain. Ambulates independently. Oriented to unit, call light and plan. Awaiting observation orders. Will continue to monitor.

## 2011-07-21 NOTE — ED Provider Progress Notes (Signed)
ED Provider Progress Note       Pt is a 46 y.o. white male co CP started at 1pm today,elevated HR< pt states that he takes his Nitro every day,pt denies dizziness, pt took 5 nitro today,     CAth PCA 03/2011    Shelena Castelluccio, PA, 07/21/2011, 7:04 PM

## 2011-07-21 NOTE — ED Notes (Signed)
Around [id number] patient started with mid sternal chest pain with left arm numbness and tingling- pt took 2 NTG SL with relief of symptoms. 1.5 hrs later pain returned and patient took 2 more NTG SL with relief. after pt administered 1 NTG Sl with relief. Pt now with 4/10 mid sternal chest pain "tight pressure" with SOB. Pt with history of MI in Oct 19th with stent

## 2011-07-21 NOTE — ED Notes (Signed)
Pt resting comfortably in bed. No complaints at this time. Denies pain. Oriented to unit, call light and plan. Awaiting observation orders. Will continue to monitor and treat per orders.

## 2011-07-21 NOTE — ED Obs Notes (Signed)
ED OBSERVATION ADMISSION NOTE    Patient seen by me today, 07/21/2011 at 11:21 PM    Current patient status: Observation    History     Chief Complaint   Patient presents with   . Chest Pain     HPI Comments: 46 yr old male with h/o cad s/p bare metal stent in 10/12 for ACS presents with several episodes of CP that was ntg responsive. Pain is described as ms, radiating to left arm and associated with sob and nausea. Also reports increased fatigue with daily activities including stairs. He is being referred to obs for further management/work up of chest pain.    The history is provided by the patient. No language interpreter was used.       Past Medical History   Diagnosis Date   . Prostatitis    . Anxiety    . Benign prostatic hypertrophy    . Depression    . Discogenic syndrome    . Nicotine dependence    . Myocardial infarction 04/04/11     stemi   . Coronary artery disease    . Cardiac arrest        Past Surgical History   Procedure Date   . Ankle surgery    . Coronary angioplasty with stent placement 04/04/11     BMS to LAD   . Coronary angioplasty 04/04/11     with BMS to LAD   . Cardiac catherization    . Ankle surgery        Family History   Problem Relation Age of Onset   . Diabetes Mother    . High cholesterol Mother    . Heart disease Father    . Diabetes Father        Social History      reports that he quit smoking about 3 months ago. His smoking use included Cigarettes. He has a 19.2 pack-year smoking history. He has quit using smokeless tobacco. He reports that he does not drink alcohol or use illicit drugs. His sexual activity history not on file.    Living Situation     Questions Responses    Patient lives with Spouse    Homeless No    Caregiver for other family member     External Services None    Employment Disabled    Domestic Violence Risk No          Review of Systems   Review of Systems   Constitutional: Positive for activity change. Negative for fever, diaphoresis and unexpected weight change.    HENT: Negative.    Eyes: Negative.    Respiratory: Positive for shortness of breath. Negative for apnea, cough, choking, chest tightness, wheezing and stridor.    Cardiovascular: Positive for chest pain. Negative for palpitations and leg swelling.   Gastrointestinal: Positive for nausea. Negative for vomiting, abdominal pain, diarrhea, constipation, blood in stool, abdominal distention, anal bleeding and rectal pain.   Genitourinary: Negative for dysuria, hematuria, flank pain, decreased urine volume and difficulty urinating.   Musculoskeletal: Negative.    Skin: Negative.    Neurological: Negative.    Hematological: Negative.    Psychiatric/Behavioral: Negative.        Physical Exam   BP 136/83  Pulse 72  Temp(Src) 36.4 C (97.5 F) (Temporal)  Resp 16  Ht 1.829 m (6')  Wt 85.276 kg (188 lb)  BMI 25.50 kg/m2  SpO2 98%    Physical Exam   Constitutional: He is oriented  to person, place, and time. He appears well-developed and well-nourished. No distress.   HENT:   Head: Normocephalic and atraumatic.   Eyes: EOM are normal.   Neck: Neck supple. No JVD present.   Cardiovascular: Normal rate, regular rhythm, normal heart sounds and intact distal pulses.  Exam reveals no gallop and no friction rub.    No murmur heard.  Pulmonary/Chest: Effort normal and breath sounds normal. No respiratory distress. He has no wheezes. He has no rales. He exhibits no tenderness.   Abdominal: Soft. Bowel sounds are normal. He exhibits no distension. There is no tenderness.   Musculoskeletal: Normal range of motion. He exhibits no edema and no tenderness.   Lymphadenopathy:     He has no cervical adenopathy.   Neurological: He is alert and oriented to person, place, and time.   Skin: Skin is warm and dry.   Psychiatric: He has a normal mood and affect.       Tests    UJW:JXBJYN EKG, normal sinus rhythm, unchanged from previous tracings    Labs:   All labs in the last 24 hours   Recent Results (from the past 24 hour(s))   TROPONIN T     Collection Time    07/21/11  7:16 PM       Component Value Range    Troponin T <0.01  0.00 - 0.02 ng/mL   CK ISOENZYMES    Collection Time    07/21/11  7:16 PM       Component Value Range    CK 146  46 - 171 U/L    Mass CKMB 1.8  0.0 - 4.9 ng/mL   CBC AND DIFFERENTIAL    Collection Time    07/21/11  7:16 PM       Component Value Range    WBC 6.1  4.2 - 9.1 THOU/uL    RBC 4.5 (*) 4.6 - 6.1 MIL/uL    Hemoglobin 14.0  13.7 - 17.5 g/dL    Hematocrit 42  40 - 51 %    MCV 93 (*) 79 - 92 fL    RDW 12.5  11.6 - 14.4 %    Platelets 283  150 - 330 THOU/uL    Seg Neut % 55.8  34.0 - 67.9 %    Lymphocyte % 32.1  21.8 - 53.1 %    Monocyte % 8.8  5.3 - 12.2 %    Eosinophil % 2.8  0.8 - 7.0 %    Basophil % 0.5  0.2 - 1.2 %    Neut # K/uL 3.4  1.8 - 5.4 THOU/uL    Lymph # K/uL 1.9  1.3 - 3.6 THOU/uL    Mono # K/uL 0.5  0.3 - 0.8 THOU/uL    Eos # K/uL 0.2  0.0 - 0.5 THOU/uL    Baso # K/uL 0.0  0.0 - 0.1 THOU/uL   PLASMA PROF 7 [facility] ED ONLY)    Collection Time    07/21/11  7:16 PM       Component Value Range    Chloride,Plasma 105  96 - 108 mmol/L    CO2,Plasma 27  20 - 28 mmol/L    Potassium,Plasma 3.9  3.4 - 4.7 mmol/L    Sodium,Plasma 141  132 - 146 mmol/L    Anion Gap 9  7 - 16    UN,Plasma 15  6 - 20 mg/dL    Creatinine,Plasma 8.29  0.67 - 1.17 mg/dL    GFR,Caucasian >56  GFR,Black >59      Glucose,Plasma 93  60 - 99 mg/dL   APTT    Collection Time    07/21/11  7:16 PM       Component Value Range    aPTT 31.0  25.8 - 37.9 sec   PROTIME-INR    Collection Time    07/21/11  7:16 PM       Component Value Range    Protime 12.0  9.2 - 12.3 sec    INR 1.2  1.0 - 1.2       Imaging:CXR - nad    Medical Decision Making     Assessment:  46 y.o., male placed in OBS after evaluation in the ED for NTG responsive CP similar to previous CP when having MI in 10/12 requiring stenting. Has baremetal stent and is on ticagrelor.    Differential Diagnosis includes ACS, gastritis/GERD, anxiety    Plan: tele, complete r/o, increase PPI to bid at  home/Protonix 40 here, add H2 blocker, GI consult as outpatient (PCP has already suggested), possible cards consult in am given proximity to previous injury. Continue home meds.    Medically preferred DVT prophylaxis: None - ambulatory    MDM    Irven Shelling, NP  Supervising physician Oneida Alar was immediately available.

## 2011-07-21 NOTE — Telephone Encounter (Signed)
Patient called to state that he has had chest pain all day. He took two nitro with temporary relief. Wants to see what he should do next. I called Dot Lanes NP and asked her to contact this patient now. This is the follow-up documentation to the patient's initial phone call.    If you respond to this phone call, please assign it to the [facility] Card NP pool.  Thanks.

## 2011-07-21 NOTE — ED Provider Notes (Signed)
History     Chief Complaint   Patient presents with   . Chest Pain     Patient is a 46 y.o. male presenting with chest pain. The history is provided by the patient and the spouse. No language interpreter was used.   Chest Pain  Episode onset: off an d on all day, 5 episodes today, relieved by nitro temoorarily. Duration of episode(s) is 2 hours. Chest pain occurs frequently. The chest pain is improving. Associated with: had some tachycardia,  At its most intense, the pain is at 8/10. The pain is currently at 3/10. The severity of the pain is severe. The quality of the pain is described as pressure-like and tightness (has not had this type of pain previously). The pain radiates to the left arm. Primary symptoms include fatigue, shortness of breath, palpitations and nausea. Pertinent negatives for primary symptoms include no fever, no syncope, no cough, no wheezing, no abdominal pain, no vomiting, no dizziness and no altered mental status. Primary symptoms comment: did have some lightheadedness   The palpitations also occurred with shortness of breath. The palpitations did not occur with syncope or dizziness.     Associated symptoms include claudication, diaphoresis and weakness.   Pertinent negatives for associated symptoms include no lower extremity edema, no near-syncope, no numbness, no orthopnea and no paroxysmal nocturnal dyspnea. He tried nitroglycerin (took 1-2 with relief for the various episodes) for the symptoms. Risk factors include male gender, smoking/tobacco exposure and stress.   His past medical history is significant for CAD and MI.   Pertinent negatives for past medical history include no diabetes, no hyperlipidemia and no pacemaker.   His family medical history is significant for CAD in family, heart disease in family and hypertension in family.   Procedure history is positive for cardiac catheterization. Procedure history comments: bypass.         Past Medical History   Diagnosis Date   .  Prostatitis    . Anxiety    . Benign prostatic hypertrophy    . Depression    . Discogenic syndrome    . Nicotine dependence    . Myocardial infarction 04/04/11     stemi   . Coronary artery disease    . Cardiac arrest             Past Surgical History   Procedure Date   . Ankle surgery    . Coronary angioplasty with stent placement 04/04/11     BMS to LAD   . Coronary angioplasty 04/04/11     with BMS to LAD   . Cardiac catherization    . Ankle surgery        Family History   Problem Relation Age of Onset   . Diabetes Mother    . High cholesterol Mother    . Heart disease Father    . Diabetes Father          Social History      reports that he quit smoking about 3 months ago. His smoking use included Cigarettes. He has a 19.2 pack-year smoking history. He has quit using smokeless tobacco. He reports that he does not drink alcohol or use illicit drugs. His sexual activity history not on file.    Living Situation     Questions Responses    Patient lives with Spouse    Homeless No    Caregiver for other family member     External Services None    Employment Disabled  Domestic Violence Risk No          Review of Systems   Review of Systems   Constitutional: Positive for diaphoresis and fatigue. Negative for fever.   HENT: Negative for neck pain and neck stiffness.    Eyes: Negative for photophobia and visual disturbance.   Respiratory: Positive for chest tightness and shortness of breath. Negative for cough and wheezing.    Cardiovascular: Positive for chest pain, palpitations and claudication. Negative for orthopnea, leg swelling, syncope and near-syncope.   Gastrointestinal: Positive for nausea. Negative for vomiting and abdominal pain.   Genitourinary: Negative for dysuria, frequency and hematuria.   Musculoskeletal: Positive for back pain and arthralgias.   Skin: Negative for color change and pallor.   Neurological: Positive for weakness. Negative for dizziness, syncope and numbness.   Psychiatric/Behavioral:  Negative for confusion and altered mental status.       Physical Exam     ED Triage Vitals   BP Heart Rate Resp Temp Temp Source SpO2 O2 Device O2 Flow Rate weight   07/21/11 1659 07/21/11 1659 07/21/11 1659 07/21/11 1659 07/21/11 1659 07/21/11 1659 07/21/11 1659 -- 07/21/11 1659   117/76 mmHg 79  20  36.7 C (98.1 F) TEMPORAL 98 % None (Room air)  85.276 kg (188 lb)       Physical Exam   Constitutional: He is oriented to person, place, and time. He appears well-developed and well-nourished. No distress.   HENT:   Head: Normocephalic and atraumatic.   Mouth/Throat: Oropharynx is clear and moist. No oropharyngeal exudate.   Eyes: Conjunctivae and EOM are normal. Pupils are equal, round, and reactive to light. No scleral icterus.   Neck: Normal range of motion. Neck supple. No tracheal deviation present. No thyromegaly present.   Cardiovascular: Normal rate, regular rhythm, normal heart sounds and intact distal pulses.    Pulmonary/Chest: Effort normal and breath sounds normal. No stridor. No respiratory distress. He has no wheezes. He exhibits no tenderness.   Abdominal: Soft. Bowel sounds are normal. He exhibits no distension. There is no tenderness.   Musculoskeletal: Normal range of motion. He exhibits no edema and no tenderness.        No cords   Lymphadenopathy:     He has no cervical adenopathy.   Neurological: He is alert and oriented to person, place, and time. No cranial nerve deficit.   Skin: Skin is warm and dry. He is not diaphoretic. No erythema.   Psychiatric: He has a normal mood and affect. His behavior is normal. Thought content normal.       Medical Decision Making   MDM  Number of Diagnoses or Management Options  Diagnosis management comments: Patient seen by me today, 07/21/2011 at 8:17 PM    Assessment:  46 y.o., male comes to the ED with chest pain, SOB with cardiac history  Differential Diagnosis includes ACS, pneumothorax, PNA, chest wall pain  Plan: IV, monitor, labs, CXR, ECG, admit          Amount and/or Complexity of Data Reviewed  Clinical lab tests: ordered  Tests in the radiology section of CPT: ordered          Zyonna Vardaman [place], MD    Rochele Raring, MD  07/21/11 2018

## 2011-07-22 ENCOUNTER — Other Ambulatory Visit: Payer: Self-pay | Admitting: Gastroenterology

## 2011-07-22 ENCOUNTER — Other Ambulatory Visit: Payer: Self-pay | Admitting: Cardiology

## 2011-07-22 ENCOUNTER — Encounter: Payer: Self-pay | Admitting: Geriatric Medicine

## 2011-07-22 LAB — TROPONIN T
Troponin T: <0.01 ng/mL (ref 0.00–0.02)
Troponin T: <0.01 ng/mL (ref 0.00–0.02)

## 2011-07-22 MED ORDER — MIDAZOLAM HCL 1 MG/ML IJ SOLN *I* WRAPPED
INTRAMUSCULAR | Status: AC
Start: 2011-07-22 — End: 2011-07-22
  Filled 2011-07-22: qty 5

## 2011-07-22 MED ORDER — FENTANYL CITRATE 50 MCG/ML IJ SOLN *WRAPPED*
INTRAMUSCULAR | Status: AC
Start: 2011-07-22 — End: 2011-07-22
  Filled 2011-07-22: qty 2

## 2011-07-22 MED ORDER — HYDROMORPHONE HCL PF 1 MG/ML IJ SOLN *WRAPPED*
1.0000 mg | Freq: Once | INTRAMUSCULAR | Status: AC
Start: 2011-07-22 — End: 2011-07-22
  Administered 2011-07-22: 1 mg via INTRAVENOUS
  Filled 2011-07-22: qty 1

## 2011-07-22 MED ORDER — SODIUM CHLORIDE 0.9 % IV SOLN WRAPPED *I*
100.0000 mL/h | Status: DC
Start: 2011-07-22 — End: 2011-07-23
  Administered 2011-07-22: 100 mL/h via INTRAVENOUS

## 2011-07-22 MED ORDER — HEPARIN SODIUM (PORCINE) 1000 UNIT/ML IJ SOLN *WRAPPED*
Status: AC
Start: 2011-07-22 — End: 2011-07-22
  Filled 2011-07-22: qty 10

## 2011-07-22 NOTE — ED Notes (Signed)
Patient is resting comfortably. 

## 2011-07-22 NOTE — Progress Notes (Signed)
Utilization Management    Level of Care Observation service as of the date 07/21/11      Dutch Quint, RN     Pager: [phone]

## 2011-07-22 NOTE — ED Obs Notes (Signed)
ED OBSERVATION DISCHARGE NOTE    Patient seen by me today, 07/22/2011 at 8:37 AM.    Current patient status: Observation    Subjective:      Currently denies chest pain, sob, nausea,vomitting,abd.pain, denies cough, fever  Ambulatory without any complaints of chest pain or shortness of breath    Labs, imaging reviewed     Observation Stay Includes:  46 y.o.male who presented to the ED with Chief Complaint   Patient presents with   . Chest Pain       Last Nursing documented pain:  0-10 Scale: 5 (07/22/11 0757)      Vitals:  Patient Vitals for the past 24 hrs:   BP Temp Temp src Pulse Resp SpO2 Height Weight   07/22/11 0832 122/74 mmHg 36.1 C (97 F) TEMPORAL 69  16  97 % - -   07/22/11 0757 125/68 mmHg 36 C (96.8 F) TEMPORAL 63  18  97 % - -   07/22/11 0124 114/67 mmHg 36.2 C (97.2 F) TEMPORAL 60  16  99 % - -   07/21/11 2224 136/83 mmHg 36.4 C (97.5 F) TEMPORAL 72  16  98 % - -   07/21/11 2148 119/70 mmHg 36 C (96.8 F) TEMPORAL - 16  98 % - -   07/21/11 1753 119/66 mmHg - - 70  20  98 % - -   07/21/11 1659 117/76 mmHg 36.7 C (98.1 F) TEMPORAL 79  20  98 % 1.829 m (6') 85.276 kg (188 lb)         Physical Exam:  Physical Exam  Comfortable  Afebrile  Nontoxic  Chest--B/L  CTA  CVS--RRR  ABD. --Soft, BS +, Nontender  CNS--nonfocal  Ext. Edema  Pulses -Equal   EKG: normal EKG, normal sinus rhythm, unchanged from previous tracings  Labs:   All labs in the last 24 hours   Recent Results (from the past 24 hour(s))   TROPONIN T    Collection Time    07/21/11  7:16 PM       Component Value Range    Troponin T <0.01  0.00 - 0.02 ng/mL   CK ISOENZYMES    Collection Time    07/21/11  7:16 PM       Component Value Range    CK 146  46 - 171 U/L    Mass CKMB 1.8  0.0 - 4.9 ng/mL   CBC AND DIFFERENTIAL    Collection Time    07/21/11  7:16 PM       Component Value Range    WBC 6.1  4.2 - 9.1 THOU/uL    RBC 4.5 (*) 4.6 - 6.1 MIL/uL    Hemoglobin 14.0  13.7 - 17.5 g/dL    Hematocrit 42  40 - 51 %    MCV 93 (*) 79 - 92 fL    RDW 12.5   11.6 - 14.4 %    Platelets 283  150 - 330 THOU/uL    Seg Neut % 55.8  34.0 - 67.9 %    Lymphocyte % 32.1  21.8 - 53.1 %    Monocyte % 8.8  5.3 - 12.2 %    Eosinophil % 2.8  0.8 - 7.0 %    Basophil % 0.5  0.2 - 1.2 %    Neut # K/uL 3.4  1.8 - 5.4 THOU/uL    Lymph # K/uL 1.9  1.3 - 3.6 THOU/uL    Mono # K/uL 0.5  0.3 - 0.8 THOU/uL    Eos # K/uL 0.2  0.0 - 0.5 THOU/uL    Baso # K/uL 0.0  0.0 - 0.1 THOU/uL   PLASMA PROF 7 [facility] ED ONLY)    Collection Time    07/21/11  7:16 PM       Component Value Range    Chloride,Plasma 105  96 - 108 mmol/L    CO2,Plasma 27  20 - 28 mmol/L    Potassium,Plasma 3.9  3.4 - 4.7 mmol/L    Sodium,Plasma 141  132 - 146 mmol/L    Anion Gap 9  7 - 16    UN,Plasma 15  6 - 20 mg/dL    Creatinine,Plasma 0.96  0.67 - 1.17 mg/dL    GFR,Caucasian >04      GFR,Black >59      Glucose,Plasma 93  60 - 99 mg/dL   APTT    Collection Time    07/21/11  7:16 PM       Component Value Range    aPTT 31.0  25.8 - 37.9 sec   PROTIME-INR    Collection Time    07/21/11  7:16 PM       Component Value Range    Protime 12.0  9.2 - 12.3 sec    INR 1.2  1.0 - 1.2   TROPONIN T    Collection Time    07/22/11  1:33 AM       Component Value Range    Troponin T <0.01  0.00 - 0.02 ng/mL   TROPONIN T    Collection Time    07/22/11  8:01 AM       Component Value Range    Troponin T <0.01  0.00 - 0.02 ng/mL     Imaging findings: *chest Standard Frontal And Lateral Views    07/21/2011  Impression: No acute cardiopulmonary disease.   End Report   The consultation was reviewed and approved by an attending radiologist after exam interpretation with a radiologist in training or PA.     *chest Standard Frontal And Lateral Views    06/24/2011  Impression: Normal          Cardiac Testing:  Telemetry:   Consults: Cardiology    Assessment:/ Plan:  [name] is a 46 y.o. male with recurrent episodes of CP, relieved by NTG, RECENT MI, S/P LAD --BARE METAL STENT On dual Antiplatelet agents    Observation Stay has been uneventful, maintained on  Tele, that did not show anything acute, ruled out for ACS, 3 negative Troponins    EVAL. By cardiology --keep NPO, cardiac cath today   Start I/V fluids,   # chronic back pain --continue outpt. meds     Disposition: Home  Follow-up:  within the next 2-5 days.  with  PCP   Smoking Cessation: NA    Diagnoses that have been ruled out:   None   Diagnoses that are still under consideration:   None   Final diagnoses:   Chest pain   SOB (shortness of breath)       Author: Freddie Apley, MD  Note created: 07/22/2011  at: 8:37 AM

## 2011-07-22 NOTE — Provider Consult (Signed)
Subjective:     [name] is a 46 y.o. man with history of STEMI on 04/04/11 that was treated with BMS to mLAD, former smoker, and anxiety. Since his MI, he has had multiple episodes of atypical chest pain that have resulted in several ED visits, with negative workups. He is referred for coronary angiography. The patient reports compliance with medications, denies any history of abnormal bleeding, and does not have any surgeries scheduled in the next 12 months.    Past Medical History   Diagnosis Date   . Prostatitis    . Anxiety    . Benign prostatic hypertrophy    . Depression    . Discogenic syndrome    . Nicotine dependence    . Myocardial infarction 04/04/11     stemi   . Coronary artery disease    . Cardiac arrest        History   Smoking status   . Former Smoker -- 0.8 packs/day for 24 years   . Types: Cigarettes   . Quit date: 04/04/2011   Smokeless tobacco   . Former Neurosurgeon       Allergies:   Allergies   Allergen Reactions   . Latex      Created by Conversion - 0;    . No Known Drug Allergy      Created by Conversion - 0;        Prior to Admission Medications:    (Not in a hospital admission)    Active Hospital Medications:  Current Facility-Administered Medications   Medication Dose Route Frequency   . sodium chloride IV  100 mL/hr Intravenous Continuous   . HYDROcodone-acetaminophen (NORCO) 5-325 MG per tablet 2 tablet  2 tablet Oral Q6H PRN   . acetaminophen (TYLENOL) tablet 650 mg  650 mg Oral Q4H PRN   . morphine 4 MG/ML injection 4 mg  4 mg Intravenous Q4H PRN   . pantoprazole (PROTONIX) EC tablet 40 mg  40 mg Oral QAM   . famotidine (PEPCID) tablet 20 mg  20 mg Oral BID   . sertraline (ZOLOFT) tablet 200 mg  200 mg Oral Nightly   . lisinopril (PRINIVIL,ZESTRIL) tablet 5 mg  5 mg Oral Daily   . atorvastatin (LIPITOR) tablet 40 mg  40 mg Oral QPM   . aspirin EC tablet 81 mg  81 mg Oral Daily   . ticagrelor (BRILINTA) tablet 90 mg  90 mg Oral Q12H     Current Outpatient Prescriptions   Medication    . sertraline (ZOLOFT) 100 MG tablet   . lisinopril (PRINIVIL,ZESTRIL) 5 MG tablet   . atorvastatin (LIPITOR) 40 MG tablet   . cyclobenzaprine (FLEXERIL) 10 MG tablet   . nitroglycerin (NITROSTAT) 0.4 MG SL tablet   . aspirin 81 MG EC tablet   . Ticagrelor (BRILINTA) 90 MG TABS tablet          Objective:     Physical Exam  Vitals:  Blood pressure 115/66, pulse 66, temperature 36.3 C (97.3 F), temperature source Tympanic, resp. rate 18, height 1.829 m (6'), weight 85.276 kg (188 lb), SpO2 95.00%.    Vitals in last 24 hrs:  Patient Vitals for the past 24 hrs:   BP Temp Temp src Pulse Resp SpO2 Height Weight   07/22/11 0914 115/66 mmHg 36.3 C (97.3 F) Tympanic 66  18  95 % - -   07/22/11 0832 122/74 mmHg 36.1 C (97 F) TEMPORAL 69  16  97 % - -  07/22/11 0757 125/68 mmHg 36 C (96.8 F) TEMPORAL 63  18  97 % - -   07/22/11 0124 114/67 mmHg 36.2 C (97.2 F) TEMPORAL 60  16  99 % - -   07/21/11 2224 136/83 mmHg 36.4 C (97.5 F) TEMPORAL 72  16  98 % - -   07/21/11 2148 119/70 mmHg 36 C (96.8 F) TEMPORAL - 16  98 % - -   07/21/11 1753 119/66 mmHg - - 70  20  98 % - -   07/21/11 1659 117/76 mmHg 36.7 C (98.1 F) TEMPORAL 79  20  98 % 1.829 m (6') 85.276 kg (188 lb)     O2 Device: None (Room air) (07/22/11 1015)      BMI: Body mass index is 25.50 kg/(m^2).    Airway Visibility: soft palate and uvula    Breath sounds: clear  Cardiovascular:  S1, S2 and 2+ R radial pulse with normal Allen's test    Lab Review   Lab Results   Component Value Date    NA 142 07/02/2011    K 4.5 07/02/2011    CL 103 07/02/2011    CO2 27 07/02/2011    UN 14 07/02/2011    CREAT 0.80 07/02/2011    CA 9.3 07/02/2011    GLU 99 07/02/2011    WBC 6.1 07/21/2011    HCT 42 07/21/2011    HGB 14.0 07/21/2011    MCV 93* 07/21/2011    PLT 283 07/21/2011    GFRB > 59 07/02/2011    GFRC > 59 07/02/2011     Protime   Date Value Range Status   07/21/2011 12.0  9.2 - 12.3 sec Final        INR   Date Value Range Status   07/21/2011 1.2  1.0 - 1.2 Final                                                Therapeutic   2.0-3.0      The INR should be used to monitor patients on long term Warfarin.      Selected patients may require higher levels of anticoagulation.         CKD stage: (1=slight, GFR>90 / 2=mild / 3=moderate / 4=severe / 5=end-stage, GFR<15) Not Applicable    Anesthesiologist's Physical Status rating of the patient: Class III: Severe Systemic Disease    Plan for sedation: Moderate  I am evaluating the patient immediately prior to admission of sedation medication. The plan for sedation remains appropriate.      Assessment:   Indications for procedure: chest pain     Plan:   Cardiac cath to rule out ischemic CAD.  Possible angioplasty.  The procedure and risks described to patient including risk of CVA, MI, bleeding, emergency surgery, death, contrast nephropathy, exposure to radiation.  Renal: Limit dye use    Author: Susie Cassette, MD  as of: 07/22/2011  at: 11:19 AM

## 2011-07-22 NOTE — Discharge Instructions (Signed)
[facility]  Cardiac Catheterization  Electrophysiology Labs  Patient Discharge Instructions      Date: 07/21/2011    Attending Physician: Dr. Stann Mainland                                     Procedure: Left Heart Catheterization      FOLLOW-UP CARE  Call for an appointment with Dr. Audery Amel    In 3 days as follow-up visit appointment post angiogram.    INSTRUCTIONS    Notify Dr Audery Amel, MD promptly if you experience any of the following symptoms: chest pain, shortness of breath, lightheadedness.    If you notice a sudden bright red bleeding or swelling at procedure site, apply firm pressure above the site and call 911.    If signs of infection such as redness, swelling, increased pain or fever, please call one of the following numbers:     Cardiac Catheterization Lab: [phone] between the hours of 8 am and 4:30 pm on Mon - Fri but during weekends, holidays and evening/night hours call:   Cardiac Cath Lab: [phone]   Electrophysiology Study Lab: [phone]    DIET  Resume your previous diet.  Do not drink alcoholic beverages for the next 24 hours.  Drink 8 glasses of water a day for 3 days to flush out the dye out of your body.    CARE OF DRESSING OR INCISION  It is normal to have:      1.  A lump at the puncture site.  The lump can range from the size of a pea to the size of a walnut.  This lump will slowly go away over the next month.      2.  Bruising around the site.  The bruise will go though many color changes.  It may take several weeks to completely go away.      3.  Soreness, which will improve within a few days       Keep site clean and dry.    A shower is permitted 24 hours after your procedure  Remove the dressing/band-aid before showering and allow the water to run over the puncture site.  Clean the site daily, for the next 5 days, with soap and water.  Gently pat dry.    Place a clean band-aid over the site after washing until puncture site is healed.   Radial  Do not immerse your  arm/hand in water for one week  No blood pressure measurements or blood draws in the right arm for 4 days    ACTIVITY  No lifting, pushing or pulling more than 5 lbs. (e.g. shoveling, mowing, raking, vacuuming, shoveling) for 2 days.  Minimal stair climbing  Do not drive for 2 days  May resume sexual intercourse in 2 days  You may return to work in __2__days    PAIN MANAGEMENT/OTHER  Make no major decisions for the next 24 hours.  You have received medication that may make you sleepy.  Do not drive, drink alcohol, or operate machinery for 2 days.  For mild pain you can take Acetaminophen/Tylenol.    PRESCRIPTIONS  Medication: no new medications.  Resume home meds.                                 Bloodwork:   n/a    Additional  Written Instructions Provided to Patient: No  If yes, specify: n/a    Provider Signature: Sandford Craze, NP  Date/Time: 07/22/2011 12:55 PM    Instructions reviewed with patient by: ______________________________________        Signature/Title   Date    I HAVE RECEIVED AND UNDERSTAND INSTRUCTIONS PROVIDED  ______________________________________________________________________    Signature of Patient or Significant Other (Enter Relationship to Patient if not Patient)

## 2011-07-23 ENCOUNTER — Ambulatory Visit: Payer: Self-pay | Admitting: Primary Care

## 2011-07-23 LAB — EKG 12-LEAD
P: 42 degrees
QRS: 6 degrees
Rate: 64 {beats}/min
Severity: NORMAL
Severity: NORMAL
T: 62 degrees

## 2011-07-24 LAB — EKG 12-LEAD
P: 71 degrees
P: 46 degrees
P: 59 degrees
QRS: 19 degrees
QRS: 6 degrees
QRS: 23 degrees
Rate: 69 {beats}/min
Rate: 60 {beats}/min
Rate: 55 {beats}/min
Severity: NORMAL
Severity: NORMAL
Severity: ABNORMAL
Severity: ABNORMAL
Severity: ABNORMAL
Severity: ABNORMAL
T: 74 degrees
T: 52 degrees
T: 59 degrees

## 2011-07-30 ENCOUNTER — Observation Stay
Admission: EM | Admit: 2011-07-30 | Disposition: A | Discharge status: Home / Self Care | Payer: Self-pay | Source: Ambulatory Visit | Attending: Internal Medicine | Admitting: Internal Medicine

## 2011-07-30 ENCOUNTER — Other Ambulatory Visit: Payer: Self-pay | Admitting: Gastroenterology

## 2011-07-30 ENCOUNTER — Encounter: Payer: Self-pay | Admitting: Emergency Medicine

## 2011-07-30 LAB — CBC AND DIFFERENTIAL
Baso # K/uL: 0 10*3/uL (ref 0.0–0.1)
Basophil %: 0.3 % (ref 0.2–1.2)
Eos # K/uL: 0.2 10*3/uL (ref 0.0–0.5)
Eosinophil %: 3.9 % (ref 0.8–7.0)
Hematocrit: 40 % (ref 40–51)
Hemoglobin: 13 g/dL — ABNORMAL LOW (ref 13.7–17.5)
Lymph # K/uL: 2.1 10*3/uL (ref 1.3–3.6)
Lymphocyte %: 35.4 % (ref 21.8–53.1)
MCV: 93 fL — ABNORMAL HIGH (ref 79–92)
Mono # K/uL: 0.5 10*3/uL (ref 0.3–0.8)
Monocyte %: 8.3 % (ref 5.3–12.2)
Neut # K/uL: 3.1 10*3/uL (ref 1.8–5.4)
Platelets: 259 10*3/uL (ref 150–330)
RBC: 4.3 MIL/uL — ABNORMAL LOW (ref 4.6–6.1)
RDW: 12.7 % (ref 11.6–14.4)
Seg Neut %: 52.1 % (ref 34.0–67.9)
WBC: 5.9 10*3/uL (ref 4.2–9.1)

## 2011-07-30 LAB — HOLD SST

## 2011-07-30 LAB — PLASMA PROF 7 (ED ONLY)
Anion Gap,PL: 11 (ref 7–16)
CO2,Plasma: 24 mmol/L (ref 20–28)
Chloride,Plasma: 104 mmol/L (ref 96–108)
Creatinine: 0.77 mg/dL (ref 0.67–1.17)
GFR,Black: >59 *
GFR,Caucasian: >59 *
Glucose,Plasma: 94 mg/dL (ref 60–99)
Potassium,Plasma: 3.8 mmol/L (ref 3.4–4.7)
Sodium,Plasma: 139 mmol/L (ref 132–146)
UN,Plasma: 14 mg/dL (ref 6–20)

## 2011-07-30 LAB — TROPONIN T: Troponin T: <0.01 ng/mL (ref 0.00–0.02)

## 2011-07-30 LAB — HOLD GRAY

## 2011-07-30 LAB — HOLD BLUE

## 2011-07-30 MED ORDER — ASPIRIN 81 MG PO TBEC *I*
81.0000 mg | DELAYED_RELEASE_TABLET | Freq: Every day | ORAL | Status: DC
Start: 2011-07-31 — End: 2011-07-31
  Filled 2011-07-30: qty 1

## 2011-07-30 MED ORDER — TICAGRELOR 90 MG PO TABS *I*
90.0000 mg | ORAL_TABLET | Freq: Two times a day (BID) | ORAL | Status: DC
Start: 2011-07-30 — End: 2011-07-31
  Filled 2011-07-30 (×2): qty 1

## 2011-07-30 MED ORDER — ATORVASTATIN CALCIUM 40 MG PO TABS *I*
40.0000 mg | ORAL_TABLET | Freq: Every evening | ORAL | Status: DC
Start: 2011-07-30 — End: 2011-07-31
  Filled 2011-07-30: qty 1

## 2011-07-30 MED ORDER — PANTOPRAZOLE SODIUM 40 MG PO TBEC *I*
40.0000 mg | DELAYED_RELEASE_TABLET | Freq: Every evening | ORAL | Status: DC
Start: 2011-07-30 — End: 2011-07-31
  Filled 2011-07-30: qty 1

## 2011-07-30 MED ORDER — ACETAMINOPHEN 325 MG PO TABS *I*
650.0000 mg | ORAL_TABLET | ORAL | Status: DC | PRN
Start: 2011-07-30 — End: 2011-07-31

## 2011-07-30 MED ORDER — SERTRALINE HCL 50 MG PO TABS *I*
200.0000 mg | ORAL_TABLET | Freq: Every evening | ORAL | Status: DC
Start: 2011-07-30 — End: 2011-07-31
  Filled 2011-07-30: qty 4

## 2011-07-30 MED ORDER — LISINOPRIL 10 MG PO TABS *I*
5.0000 mg | ORAL_TABLET | Freq: Every day | ORAL | Status: DC
Start: 2011-07-31 — End: 2011-07-31
  Filled 2011-07-30: qty 1

## 2011-07-30 MED ORDER — CYCLOBENZAPRINE HCL 10 MG PO TABS *I*
10.0000 mg | ORAL_TABLET | Freq: Three times a day (TID) | ORAL | Status: DC | PRN
Start: 2011-07-30 — End: 2011-07-31

## 2011-07-30 NOTE — ED Notes (Signed)
C/o chest pain x 3 hours radiating down left arm. Took NTG with relief also was given asa

## 2011-07-30 NOTE — ED Provider Notes (Addendum)
History     Chief Complaint   Patient presents with    Chest Pain     HPI Comments: Pt comes in with chest pain that started this morning while walking around.  Pain is worse with exertion, radiates to the right side and into his neck and shoulders with tingling in his left hand.  Pt reports similar symptoms in the past and had a STEMI at that time (03/2011).  No recent illnesses, no history of trauma.  No recent medication changes.  Pt took NTG x 4 at home with some relief when symptoms returned, called EMS. Received ASA and NTG x 2 en route with relief of symptoms at this time.  Nausea but no vomiting, no palpitations, no feeling light headed.  Pt was seen ~ 1 week ago with similar symptoms and had a normal angio at that time.  Pt is followed by Dr. Audery Amel.    The history is provided by the patient and the spouse.       Past Medical History   Diagnosis Date    Prostatitis     Anxiety     Benign prostatic hypertrophy     Depression     Discogenic syndrome     Nicotine dependence     Myocardial infarction 04/04/11     stemi    Coronary artery disease     Cardiac arrest             Past Surgical History   Procedure Date    Ankle surgery     Coronary angioplasty with stent placement 04/04/11     BMS to LAD    Coronary angioplasty 04/04/11     with BMS to LAD    Cardiac catherization     Ankle surgery        Family History   Problem Relation Age of Onset    Diabetes Mother     High cholesterol Mother     Heart disease Father     Diabetes Father          Social History      reports that he quit smoking about 3 months ago. His smoking use included Cigarettes. He has a 19.2 pack-year smoking history. He has quit using smokeless tobacco. He reports that he does not drink alcohol or use illicit drugs. His sexual activity history not on file.    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 Negative for fever, chills and diaphoresis.   HENT: Negative for congestion, rhinorrhea, neck pain and neck stiffness.    Eyes: Negative for photophobia, redness and visual disturbance.   Respiratory: Positive for chest tightness. Negative for cough and shortness of breath.    Cardiovascular: Positive for chest pain.   Gastrointestinal: Negative for nausea, vomiting, abdominal pain and diarrhea.   Genitourinary: Negative for frequency and flank pain.   Musculoskeletal: Negative for myalgias, arthralgias and gait problem.   Skin: Negative for color change and pallor.   Neurological: Negative for syncope, light-headedness and headaches.   Hematological: Does not bruise/bleed easily.   Psychiatric/Behavioral: Negative for behavioral problems, confusion and agitation.  Physical Exam     ED Triage Vitals   BP Heart Rate Resp Temp Temp src SpO2 O2 Device O2 Flow Rate Weight   07/30/11 1954 07/30/11 1954 07/30/11 1954 07/30/11 1954 -- 07/30/11 1954 07/30/11 1954 -- --   108/77 mmHg 86  16  36.8 C (98.2 F)  98 % None (Room air)         Physical Exam   Nursing note and vitals reviewed.  Constitutional: He is oriented to person, place, and time. He appears well-developed and well-nourished.  Non-toxic appearance. He does not have a sickly appearance. He does not appear ill. No distress.   HENT:   Head: Normocephalic and atraumatic.   Right Ear: External ear normal.   Left Ear: External ear normal.   Nose: Nose normal.   Mouth/Throat: Oropharynx is clear and moist. No oropharyngeal exudate.   Eyes: Conjunctivae and EOM are normal. Pupils are equal, round, and reactive to light.   Neck: Phonation normal.   Cardiovascular: Normal rate, regular rhythm, S1 normal, S2 normal, normal heart sounds and intact distal pulses.  Exam reveals no gallop and no friction rub.    No murmur  heard.  Pulmonary/Chest: Effort normal and breath sounds normal. No stridor. No respiratory distress. He has no decreased breath sounds. He has no wheezes. He has no rhonchi. He has no rales.   Abdominal: Soft. Normal appearance and bowel sounds are normal. There is no tenderness.   Musculoskeletal: He exhibits no tenderness.   Neurological: He is alert and oriented to person, place, and time. He displays no seizure activity.   Skin: Skin is warm and dry. No abrasion, no bruising, no ecchymosis, no laceration and no rash noted. He is not diaphoretic. No erythema. No pallor.   Psychiatric: He has a normal mood and affect. His behavior is normal. Judgment and thought content normal.       Medical Decision Making   MDM  Number of Diagnoses or Management Options  Chest pain:   Diagnosis management comments: Patient seen by me today, 07/30/2011 at the time of arrival 7:51 PM    Assessment:  46 y.o., male comes to the ED with chest pain with significant cardiac history. EKG reviewed and shows no signs of acute STEMI. Currently appears well and hemodynamically stable.    Differential Diagnosis includes   1. ACS  2. Symptoms not c/w PE  3. GERD  4. Anxiety  5. MSK strain  6. Viral illness    Plan:   1. CBC, BMP, Troponin, CK  2. UA with micro  3. EKG and telemetry  4. CXR  5. Received ASA and NTG en route  6. Likely will need to stay in OBS overnight if initial workup is negative         Amount and/or Complexity of Data Reviewed  Clinical lab tests: ordered  Tests in the radiology section of CPT: ordered  Decide to obtain previous medical records or to obtain history from someone other than the patient: yes  Obtain history from someone other than the patient: yes  Review and summarize past medical records: yes  Discuss the patient with other providers: yes  Independent visualization of images, tracings, or specimens: yes          Delton Coombes, MD    Delton Coombes, MD  Resident  07/30/11 2321            Patient seen by me  on arrival date of 07/30/2011 at 9:39pm  History:   I reviewed this patient, reviewed the resident note and agree     Exam:    I examined this patient, reviewed the resident note and agree     Decision Making:   I discussed with the documented resident decision making and agree, with edits as above    Author Kathryne Hitch, MD          Kathryne Hitch, MD  07/31/11 1445

## 2011-07-30 NOTE — ED Obs Notes (Signed)
ED OBSERVATION ADMISSION NOTE    Patient seen by me today, 07/30/2011 at 11:22 PM    Current patient status: Observation    History     Chief Complaint   Patient presents with    Chest Pain     HPI Comments: [name] is a 46 y.o. Male with history of STEMI 03/2011 treated with BMS to LAD, HTN, HLD, Depression, states that while outside playing with the dog he developed chest pain. States he took 2 ntg with relief, but pain again returned and radiated to his left shoulder. He then called EMS and was given baby aspirin and 2 more ntg which totally relieved his pain. Denies shortness of breath, nausea or vomiting. His Cardiologist is Dr. Audery Amel. First troponin was negative. The patient will be placed in obs for further evaluation of his chest pain.       The history is provided by the patient. No language interpreter was used.       Past Medical History   Diagnosis Date    Prostatitis     Anxiety     Benign prostatic hypertrophy     Depression     Discogenic syndrome     Nicotine dependence     Myocardial infarction 04/04/11     stemi    Coronary artery disease     Cardiac arrest        Past Surgical History   Procedure Date    Ankle surgery     Coronary angioplasty with stent placement 04/04/11     BMS to LAD    Coronary angioplasty 04/04/11     with BMS to LAD    Cardiac catherization     Ankle surgery        Family History   Problem Relation Age of Onset    Diabetes Mother     High cholesterol Mother     Heart disease Father     Diabetes Father        Social History      reports that he quit smoking about 3 months ago. His smoking use included Cigarettes. He has a 19.2 pack-year smoking history. He has quit using smokeless tobacco. He reports that he does not drink alcohol or use illicit drugs. His sexual activity history not on file.    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   HENT: Negative for nosebleeds, congestion, neck pain and neck stiffness.    Eyes: Negative for pain and redness.   Respiratory: Negative for cough and shortness of breath.    Cardiovascular: Positive for chest pain. Negative for palpitations and leg swelling.   Gastrointestinal: Negative for nausea, vomiting and abdominal pain.   Genitourinary: Negative for frequency, flank pain and difficulty urinating.   Musculoskeletal: Negative for myalgias and back pain.   Skin: Negative for rash and wound.   Neurological: Negative for dizziness, seizures, syncope and headaches.   Hematological: Negative for adenopathy. Does not bruise/bleed easily.   Psychiatric/Behavioral: Negative for confusion and decreased concentration. The patient is nervous/anxious.    All other systems reviewed and are negative.  Physical Exam   BP 108/77  Pulse 86  Temp 36.8 C (98.2 F)  Resp 16  SpO2 98%    Physical Exam   Nursing note and vitals reviewed.  Constitutional: He is oriented to person, place, and time. Vital signs are normal. He appears well-developed and well-nourished. He is cooperative.   HENT:   Head: Normocephalic and atraumatic.   Nose: Nose normal.   Mouth/Throat: Oropharynx is clear and moist.   Eyes: Conjunctivae and EOM are normal. Pupils are equal, round, and reactive to light.   Neck: Normal range of motion. Neck supple. No JVD present. No thyromegaly present.   Cardiovascular: Normal rate, regular rhythm, normal heart sounds and intact distal pulses.    No murmur heard.  Pulmonary/Chest: Effort normal and breath sounds normal. He has no wheezes. He has no rales.   Abdominal: Soft. Bowel sounds are normal. He exhibits no mass. There is no tenderness.   Musculoskeletal: Normal range of motion. He exhibits no edema and no tenderness.   Neurological: He is alert  and oriented to person, place, and time. No cranial nerve deficit.   Skin: Skin is warm and dry. No rash noted.   Psychiatric: He has a normal mood and affect. His behavior is normal. Judgment and thought content normal.       Tests    [id number] EKG, normal sinus rhythm, unchanged from previous tracings, normal sinus rhythm    Labs:   All labs in the last 24 hours   Recent Results (from the past 24 hour(s))   HOLD GRAY       Component Value Range    Hold Grey HOLD TUBE     PLASMA PROF 7 [facility] ED ONLY)    Collection Time    07/30/11  8:58 PM       Component Value Range    Chloride,Plasma 104  96 - 108 mmol/L    CO2,Plasma 24  20 - 28 mmol/L    Potassium,Plasma 3.8  3.4 - 4.7 mmol/L    Sodium,Plasma 139  132 - 146 mmol/L    Anion Gap 11  7 - 16    UN,Plasma 14  6 - 20 mg/dL    Creatinine,Plasma 6.21  0.67 - 1.17 mg/dL    GFR,Caucasian >30      GFR,Black >59      Glucose,Plasma 94  60 - 99 mg/dL   CBC AND DIFFERENTIAL    Collection Time    07/30/11  8:58 PM       Component Value Range    WBC 5.9  4.2 - 9.1 THOU/uL    RBC 4.3 (*) 4.6 - 6.1 MIL/uL    Hemoglobin 13.0 (*) 13.7 - 17.5 g/dL    Hematocrit 40  40 - 51 %    MCV 93 (*) 79 - 92 fL    RDW 12.7  11.6 - 14.4 %    Platelets 259  150 - 330 THOU/uL    Seg Neut % 52.1  34.0 - 67.9 %    Lymphocyte % 35.4  21.8 - 53.1 %    Monocyte % 8.3  5.3 - 12.2 %    Eosinophil % 3.9  0.8 - 7.0 %    Basophil % 0.3  0.2 - 1.2 %    Neut # K/uL 3.1  1.8 - 5.4 THOU/uL    Lymph # K/uL 2.1  1.3 - 3.6 THOU/uL    Mono # K/uL 0.5  0.3 - 0.8 THOU/uL  Eos # K/uL 0.2  0.0 - 0.5 THOU/uL    Baso # K/uL 0.0  0.0 - 0.1 THOU/uL   HOLD BLUE    Collection Time    07/30/11  8:58 PM       Component Value Range    Hold Blue HOLD TUBE     HOLD SST    Collection Time    07/30/11  8:58 PM       Component Value Range    Hold SST HOLD TUBE     TROPONIN T    Collection Time    07/30/11  8:58 PM       Component Value Range    Troponin T <0.01  0.00 - 0.02 ng/mL       Imaging:CXR NAD    Medical Decision Making      Assessment:  46 y.o., male placed in OBS after evaluation in the ED for chest pain    Differential Diagnosis includes acs, mi, chest wall pain , gerd       Plan: telemetry, serial trop/ekg, aspirin, ntg  Medically preferred DVT prophylaxis: None    MDM  Number of Diagnoses or Management Options  Chest pain:   Diagnosis management comments: Patient seen by me on 07/30/2011 at 11:21 PM.    Assessment:  46 y.o., male placed in OBS after evaluation in the ED for  Chest pain  Differential Diagnosis includes acs, mi, atypical chest pain, gerd anxiety  Plan: tele, serial trop/ekg, pain control, aspirin    Supervising physician sandra sarnoski was immediately available.              Almetta Lovely, [place]

## 2011-07-31 ENCOUNTER — Other Ambulatory Visit: Payer: Self-pay | Admitting: Gastroenterology

## 2011-07-31 LAB — TROPONIN T
Troponin T: <0.01 ng/mL (ref 0.00–0.02)
Troponin T: <0.01 ng/mL (ref 0.00–0.02)

## 2011-07-31 LAB — URINALYSIS WITH MICROSCOPIC
Blood,UA: NEGATIVE
Ketones, UA: NEGATIVE
Leuk Esterase,UA: NEGATIVE
Nitrite,UA: NEGATIVE
Protein,UA: NEGATIVE mg/dL
RBC,UA: <1 /hpf (ref 0–2)
Specific Gravity,UA: 1.005 (ref 1.002–1.030)
WBC,UA: NONE SEEN /hpf (ref 0–5)
pH,UA: 7 (ref 5.0–8.0)

## 2011-07-31 LAB — EKG 12-LEAD
P: 48 degrees
P: 71 degrees
QRS: 11 degrees
QRS: 17 degrees
Rate: 68 {beats}/min
Rate: 72 {beats}/min
Severity: NORMAL
Severity: NORMAL
Severity: NORMAL
Severity: NORMAL
T: 62 degrees
T: 62 degrees

## 2011-07-31 MED ORDER — AMLODIPINE BESYLATE 5 MG PO TABS *I*
5.0000 mg | ORAL_TABLET | Freq: Every day | ORAL | Status: DC
Start: 2011-07-31 — End: 2011-09-03

## 2011-07-31 MED ORDER — ISOSORBIDE MONONITRATE CR 30 MG PO TB24 *I*
30.0000 mg | ORAL_TABLET | Freq: Every day | ORAL | Status: DC
Start: 2011-07-31 — End: 2011-09-08

## 2011-07-31 MED ADMIN — Ticagrelor Tab 90 MG: 90 mg | ORAL | NDC 00186077739

## 2011-07-31 MED ADMIN — Atorvastatin Calcium Tab 40 MG (Base Equivalent): 40 mg | ORAL | NDC 68084058911

## 2011-07-31 MED ADMIN — Pantoprazole Sodium EC Tab 40 MG (Base Equiv): 40 mg | ORAL | NDC 63739056410

## 2011-07-31 MED ADMIN — Sertraline HCl Tab 50 MG: 200 mg | ORAL | NDC 59762490003

## 2011-07-31 MED ADMIN — Aspirin Tab Delayed Release 81 MG: 81 mg | ORAL | NDC 00904770480

## 2011-07-31 MED ADMIN — Lisinopril Tab 10 MG: 5 mg | ORAL | NDC 00172375900

## 2011-07-31 NOTE — ED Notes (Signed)
Pt reports pain 2 out of 10 in chest.  EKG obtained.  Tele monitor reads HR 69, in NSR.  Pt denies any pain medication.  Will continue to monitor.

## 2011-07-31 NOTE — ED Obs Notes (Signed)
ED OBSERVATION DISCHARGE NOTE    Patient seen by me today, 07/31/2011 at 11:35 AM    Current patient status: Observation    Subjective:      Currently denies chest pain, sob, nausea,vomitting,abd.pain, denies cough, fever  Ambulatory without any complaints of chest pain or shortness of breath    Labs, imaging reviewed     Observation Stay Includes:  46 y.o.male who presented to the ED with Chief Complaint   Patient presents with    Chest Pain       Last Nursing documented pain:  0-10 Scale: 2 (07/31/11 0924)      Vitals:  Patient Vitals for the past 24 hrs:   BP Temp Temp src Pulse Resp SpO2   07/31/11 0521 110/77 mmHg 36.4 C (97.5 F) TEMPORAL 87  18  98 %   07/31/11 0039 115/66 mmHg 36.3 C (97.3 F) Tympanic 58  18  97 %   07/30/11 1954 108/77 mmHg 36.8 C (98.2 F) - 86  16  98 %         Physical Exam:  Physical Exam  Comfortable  Afebrile  Nontoxic  Anicteric Sclerae   Chest--B/L  CTA  CVS--RRR  ABD. --Soft, NA  BS , Nontender  CNS--nonfocal  Ext. NO Edema  Pulses -Equal     Labs:   All labs in the last 24 hours   Recent Results (from the past 24 hour(s))   HOLD GRAY       Component Value Range    Hold Grey HOLD TUBE     PLASMA PROF 7 [facility] ED ONLY)    Collection Time    07/30/11  8:58 PM       Component Value Range    Chloride,Plasma 104  96 - 108 mmol/L    CO2,Plasma 24  20 - 28 mmol/L    Potassium,Plasma 3.8  3.4 - 4.7 mmol/L    Sodium,Plasma 139  132 - 146 mmol/L    Anion Gap 11  7 - 16    UN,Plasma 14  6 - 20 mg/dL    Creatinine,Plasma 1.61  0.67 - 1.17 mg/dL    GFR,Caucasian >09      GFR,Black >59      Glucose,Plasma 94  60 - 99 mg/dL   CBC AND DIFFERENTIAL    Collection Time    07/30/11  8:58 PM       Component Value Range    WBC 5.9  4.2 - 9.1 THOU/uL    RBC 4.3 (*) 4.6 - 6.1 MIL/uL    Hemoglobin 13.0 (*) 13.7 - 17.5 g/dL    Hematocrit 40  40 - 51 %    MCV 93 (*) 79 - 92 fL    RDW 12.7  11.6 - 14.4 %    Platelets 259  150 - 330 THOU/uL    Seg Neut % 52.1  34.0 - 67.9 %    Lymphocyte % 35.4  21.8 - 53.1 %     Monocyte % 8.3  5.3 - 12.2 %    Eosinophil % 3.9  0.8 - 7.0 %    Basophil % 0.3  0.2 - 1.2 %    Neut # K/uL 3.1  1.8 - 5.4 THOU/uL    Lymph # K/uL 2.1  1.3 - 3.6 THOU/uL    Mono # K/uL 0.5  0.3 - 0.8 THOU/uL    Eos # K/uL 0.2  0.0 - 0.5 THOU/uL    Baso # K/uL 0.0  0.0 - 0.1 THOU/uL   HOLD BLUE    Collection Time    07/30/11  8:58 PM       Component Value Range    Hold Blue HOLD TUBE     HOLD SST    Collection Time    07/30/11  8:58 PM       Component Value Range    Hold SST HOLD TUBE     TROPONIN T    Collection Time    07/30/11  8:58 PM       Component Value Range    Troponin T <0.01  0.00 - 0.02 ng/mL   TROPONIN T    Collection Time    07/31/11  3:20 AM       Component Value Range    Troponin T <0.01  0.00 - 0.02 ng/mL   TROPONIN T    Collection Time    07/31/11  9:24 AM       Component Value Range    Troponin T <0.01  0.00 - 0.02 ng/mL   URINALYSIS WITH MICROSCOPIC    Collection Time    07/31/11  9:34 AM       Component Value Range    Color, UA Straw  Yellow    Appearance,UR Clear  Clear    Specific Gravity,UA 1.005  1.002 - 1.030    Leuk Esterase,UA NEG  NEGATIVE    Nitrite,UA NEG  NEGATIVE    pH,UA 7.0  5.0 - 8.0    Protein,UA NEG  NEGATIVE mg/dL    Glucose,UA NORM      Ketones, UA NEG  NEGATIVE    Blood,UA NEG  NEGATIVE    RBC,UA <1  0 - 2 /hpf    WBC,UA None Seen  0 - 5 /hpf    Mucus,UA Present       Imaging findings: *chest Standard Frontal And Lateral Views    07/30/2011  IMPRESSION: No acute cardiopulmonary disease. The consultation was reviewed and approved by an attending radiologist after exam interpretation with a radiologist in training or PA.     Cardiac Testing:  Telemetry:   Consults: Cardiology    Assessment:/ Plan  [name] is a 46 y.o. male  With CP   With known CAD, Coronary interventions , recent cath     Observation Stay has been uneventful, maintained on Tele, that did not show anything acute, ruled out for ACS, 3 negative Troponins    Add Norvasc & Imdur  Advised about benefits of  Beta-BLOCKERS,  He will discuss with his cardiology   Disposition: Home  Follow-up:  within the next 2-5 days.  with  PCP and CARDS as scheduled   Smoking Cessation: NA    Diagnoses that have been ruled out:   None   Diagnoses that are still under consideration:   None   Final diagnoses:   Chest pain       Author: Freddie Apley, MD  Note created: 07/31/2011  at: 12:35 PM

## 2011-07-31 NOTE — Progress Notes (Signed)
Utilization Management    Level of Care Observation service as of the date 07/30/11      Kristine Linea, RN     Pager: [phone]

## 2011-07-31 NOTE — ED Notes (Signed)
Pt reports that Cardiologist says he can go home, so his wife who is an Char[employer] fundraiser removed his IV.  He also removed his tele monitor himself.  I told patient that I was not informed as of yet of his d/c.

## 2011-07-31 NOTE — ED Notes (Signed)
MD at bedside. 

## 2011-07-31 NOTE — Discharge Instructions (Signed)
Chest Pain (Non-Specific)     Today you have had an exam and tests to determine a specific cause for your chest pain. It is often hard to give a specific diagnosis as the cause of one's chest pain.  There is always a chance that your pain could be related to something serious, like a heart attack or a blood clot in the lungs. You need to follow up with your caregiver for further evaluation. More lab tests or other studies such as x-rays, an electrocardiogram, stress testing, or cardiac imaging may be needed to find the cause of your pain.      Most of the time nonspecific chest pain will be improved within 2-3 days of rest and mild pain medicine.  For the next few days avoid physical exertion or activities that bring on the pain. Do not smoke or drink alcohol until all your symptoms are gone. Quitting smoking is the number one way to reduce your risk for heart and lung disease. Call your caregiver for routine follow-up as advised.      CAUSES   Heart burn is caused by stomach acid going back up into the esophagus. The esophagus is the tube between the mouth and the stomach. The acid burns the sensitive inner layer of the esophagus. This causes pain which is felt in the chest under the breast bone. Heart burn is also called GERD (gastroesophageal reflux disease).   Pneumonia or bronchitis can cause painful irritation of the lung tissues.   Anxiety and stress may cause tightness in the chest associated with pain.   Inflammation around your heart (pericarditis) or lung (pleuritis, or pleurisy) may cause chest pain.   A blood clot can develop in the lung and cause chest pain.    A collapsed lung (pneumothorax) can cause chest pain. It can develop suddenly on its own (a spontaneous pneumothorax) or from trauma to the chest.    The chest wall is composed of bones, muscles and cartilage. Any of these can be the source of the pain:   l The bones can be bruised by injury.   l The muscles or cartilage can be strained  by coughing or overwork.    l The cartilage can also be affected by inflammation and become sore (costochondritis).     TREATMENT  Treatment depends on what may be causing your chest pain. Treatment may include:   Acid blockers for heart burn.   Anti-inflammatory medicine.  Pain medicine for inflammatory conditions.   Antibiotics if an infection is present.    You may be advised to change lifestyle habits that may add to your chest pain. These include stopping smoking, caffeine and chocolate. You may be also advised to keep your head elevated when sleeping. This reduces the chance of acid going backward from your stomach to your esophagus.     HOME CARE INSTRUCTIONS   If antibiotics were prescribed, take the full amount even if you are feeling better.    Continue physical activities as directed.   Only take over-the-counter or prescription medicine for pain, discomfort or fever as directed by your caregiver.   Follow your caregiver's suggestions for further testing if problems persist.   If your caregiver has given you a follow-up appointment, it is very important to keep that appointment. Not keeping the appointment could result in a chronic or permanent injury, pain, and disability. If there is any problem keeping the appointment, you must call back to this facility for assistance.        SEEK MEDICAL CARE IF:   You are having problems that you think may be side effects of the medicine you are taking. Read your medication instructions carefully.   Your chest pain persists even after following advised treatments.   You develop a rash on your chest with blisters.     SEEK IMMEDIATE MEDICAL CARE IF:   Increased chest pain, or pain that spreads to the arm, neck, jaw, back or abdomen.    Shortness of breath, increasing cough or coughing up blood.   Severe back or abdominal pain, nausea or vomiting.   Severe weakness, fainting, fever or chills.     THIS IS AN EMERGENCY. Do not wait to see if the pain will  go away. Get medical help at once. Call (911 in [place] Do not drive yourself to the hospital.     MAKE SURE YOU:    Understand these instructions.    Will watch your condition.   Will get help right away if you are not doing well or get worse.     Document Released: 03/12/2005  Document Re-Released: 05/15/2008  [employer] Patient Information 2011 [employer].

## 2011-07-31 NOTE — Consults (Addendum)
Cardiology Consult    Reason for Consult: chest pain  Primary Cardiologist: Dr. Audery Amel, [facility]    HPI:  46 y.o. male with PMH CAD w STEMI s/p BMS to LAD in 03/2011, HTN, and dyslipedemia, who presents with chest pain. He developed cramping L chest pain that radiating to his neck and L arm while exerting himself outside yesterday. He also felt some heartburn at the time with an acidic taste in his mouth. He reports associated palpitations. No associated dyspnea, no diaphoresis, no lightheadedness. He took SL nitro x4 without significant improvement in his pain. Called 911 and rec'd two more SL nitro plus ASA 324mg  and the pain went away. Reports some more mild L chest pain while here at [facility], which has occurred at rest. When he had his STEMI in 2012, he had symptoms that were similar in nature to this episode but much more mild.     His cardiologist is Dr. Audery Amel at [facility]. As above, he had his MI in 03/2011 w a single 3.51mm BMS to his LAD plus clot extraction from his D2. His LVEF at the time was 40% and he had anterolateral and apical hypokinesis. He has had multiple episodes of atypical chest pain requiring ED visits since, and had a LHC on 07/21/10, which showed non obstructive CAD w 40% distal LAD stenosis and 60% stenosis of his D2. His EF improved to 60%. He also had a nuclear SPECT in 03/2011 after his STEMI, which showed partial thickness infarction of his apex with slight perfusion defect of his apex and apical anterior region with a calculated ischemia index of 4%. He has been taking his ASA and ticagrelor, was previously on metoprolol but has been stopped. Has had increasing SL nitro use over the previous month or so due to increased frequency of exertional angina.     Review of Systems:   A complete 12-point review of symptoms was completed and is negative except as documented in HPI.     Past Medical and Surgical History:  Past Medical History   Diagnosis Date    Prostatitis     Anxiety      Benign prostatic hypertrophy     Depression     Discogenic syndrome     Nicotine dependence     Myocardial infarction 04/04/11     stemi    Coronary artery disease     Cardiac arrest      Past Surgical History   Procedure Date    Ankle surgery     Coronary angioplasty with stent placement 04/04/11     BMS to LAD    Coronary angioplasty 04/04/11     with BMS to LAD    Cardiac catherization     Ankle surgery        Medications:    (Not in a hospital admission)  Scheduled Meds:      sertraline  200 mg Oral Nightly    lisinopril  5 mg Oral Daily    atorvastatin  40 mg Oral QPM    aspirin  81 mg Oral Daily    pantoprazole  40 mg Oral QPM    ticagrelor  90 mg Oral Q12H     Continuous Infusions:   PRN Meds:.acetaminophen, cyclobenzaprine    Allergies:  Allergies   Allergen Reactions    Latex      Created by Conversion - 0;     No Known Drug Allergy      Created by Conversion - 0;  Social History:  History     Social History    Marital Status: Married     Spouse Name: N/A     Number of Children: N/A    Years of Education: N/A     Occupational History    Not on file.     Social History Main Topics    Smoking status: Former Smoker -- 0.8 packs/day for 24 years     Types: Cigarettes     Quit date: 04/04/2011    Smokeless tobacco: Former Neurosurgeon    Alcohol Use: No    Drug Use: No    Sexually Active: Not on file     Other Topics Concern    Not on file     Social History Narrative    No narrative on file       Family History:  Family History   Problem Relation Age of Onset    Diabetes Mother     High cholesterol Mother     Heart disease Father     Diabetes Father        Physical Exam:  No intake or output data in the 24 hours ending 07/31/11 0917  Filed Vitals:    07/30/11 1954 07/31/11 0039 07/31/11 0521   BP: 108/77 115/66 110/77   Pulse: 86 58 87   Temp: 36.8 C (98.2 F) 36.3 C (97.3 F) 36.4 C (97.5 F)   TempSrc:  Tympanic Temporal   Resp: 16 18 18    SpO2: 98% 97% 98%     General: NAD,  lying in bed, very pleasant  HEENT: MMM, no O/P lesions  Neck: supple, JVP non-elevated  Cardiac: RRR, S1S2, no M/G/R  Lungs: clear to auscultation bilaterally  Abdomen: soft, nontender, nondistended, normal bowel sounds  Extremities: no LEE, good distal pulses  Neuro: grossly intact    Labs:    Lab 07/30/11 2058   WBC 5.9   HGB 13.0*   HCT 40   PLT 259     No results found for this basename: NA:3,K:3,CL:3,CO2:3,BUN:3,CREAT:3,CALCIUM:3,LABALBU:3,PROT:3,BILITOT:3,ALKPHOS:3,ALT:3,AST:3,GLUCOSE:3 in the last 168 hours  No results found for this basename: APTT:3,INR:3,PTT:3 in the last 168 hours    Cardiac enzymes: trop <0.01 x2    Chol/HDL Ratio   Date Value Range Status   07/02/2011 3.3   Final        Cholesterol   Date Value Range Status   07/02/2011 123   Final      REFERENCE RANGE:  < 200 Desirable                      200-239 Borderline High                        > 240 High        HDL   Date Value Range Status   07/02/2011 37   Final      REFERENCE RANGE:  < 40 Low                        > 60 High        LDL Calculated   Date Value Range Status   07/02/2011 55   Final      REFERENCE RANGE:  < 100 Optimal                      100-129 Near or above optimal  130-159 Borderline High                      160-189 High                        > 189 Very High        Triglycerides   Date Value Range Status   07/02/2011 155*  Final      REFERENCE RANGE:  < 150 Normal                      150-199 Borderline High                      200-499 High                        > 500 Very High       ECG: sinus, poor baseline, T wave flattening in V1 and V2    Telemetry: no events    Radiology: CXR hyperinflated with prominent pulmonary vasculature    Cardiology Studies:  STUDY FINDINGS:     (1) HX PRESENT ILLNESS: [name] is a 46 y.o. man with history   of STEMI on 04/04/11 that was treated with BMS to mLAD, former smoker,   and anxiety. Since his MI, he has had multiple episodes of atypical   chest pain that  have resulted in several ED visits, with negative   workups. He is referred for coronary angiography. The patient reports   compliance with medications, denies any history of abnormal bleeding,   and does not have any surgeries scheduled in the next 12 months.     PROCEDURE INDICATION: (1) angina.     PROCEDURES PERFORMED: (1) Cath Left Ventriculography, (2) Coronary   Angiography and (3) Left Heart Catheterization. Consent was obtained   from the patient.     PROCEDURE RISK FACTORS: The following risk factors were noted before   the start of the procedure: (1) MI (remote) and (2) PCI Hx (Num Prior   PCI = 1.). The patient's hemodynamics were considered stable at the   start of the procedure. The procedure was considered elective based on   the clinical presentation.     ACUTE CORONARY SYNDROME (ACS) CHARACTERISTICS: The evaluation of this   patient before the procedure start showed no recent signs or symptoms   of an acute coronary syndrome (no ACS).     MISC: Lab test results: Serum Creatinine Pre-PCI (mg/dL) = 0.8.    (2) NON-INVASIVE HEMODYNAMICS: HR = 63 BPM, Pulse Pattern = Regular,   ECG Rhythm = NSR, BP = 140/79 mmHg, RR = 16 BPM, Arterial O2 Sat = 96%,   Height = 183 cm, Weight = 85.3 Kg, BSA = 2.08 m2 and BMI = 25.47 Kg/m2.   Arterial O2 Saturation = 96%.     INVASIVE HEMODYNAMICS:     BASELINE VALUES   LV Pressure (mmHg) 91 / 5   Aortic Pressure (mmHg) 91 / 77 / 85    (3) LEFT VENTRICULOGRAPHY FINDINGS: Overall LV systolic function is   within normal limits. The LVEF is 60%. No significant regional wall   motion abnormalities are seen.     A 40F Jacky catheter and 20 ml ioversol (Optiray 350) administered at 10   ml/sec were used for the left ventriculography procedure.    (4) NATIVE CORONARY ARTERY ANATOMY: The coronary  system is right   dominant. There is minor (non-obstructive) coronary artery disease.   The specific findings in the native vessels are:    (1) LEFT MAIN (LM): No significant stenosis.    (2) LEFT ANTERIOR DESCENDING (LAD): There is moderate disease of the   LAD. The distal left anterior descending artery has a mild stenosis   (40%). The second LAD diagonal branch has a moderate stenosis (60%).   (3) CIRCUMFLEX (Cx): No significant stenosis.   (4) RIGHT CORONARY ARTERY (RCA): No significant stenosis.   (5) POSTERIOR DESCENDING CORONARY ARTERY (PDA): No significant   stenosis. The PDA blood supply is from the right coronary artery.       CORONARY ANGIOGRAPHY PROCEDURE NOTES: Arterial access was obtained in   the right ulnar artery using sterile needle puncture. A 64F TIG 4.0   catheter was used to engage the left main coronary artery and a 64F   TIG4.0 catheter was used to engage the right coronary artery (arterial   guide wire: 0.035 standard J-wire).    (5) FINISH-UP SUMMARY: A total of 75 ml of ioversol (Optiray 350) was   administered during the procedure. Fluoroscopy time was 2 min 0 sec.   Vascular access was closed in the lab using terumo tr band. The   estimated blood Loss is < 30 cc. No tissue was removed. Following the   procedure, the patient was discharged to [facility].     POST-PROCEDURE VITALS: HR = 92 BPM and BP = 153/71 mmHg.      CONCLUSIONS: Mild (non-obstructive) coronary artery disease. Normal LV   systolic function.           ASSESSMENT:  46 y.o. male w PMH CAD w STEMI s/p BMS to LAD in 03/2011, HTN, and dyslipedemia, who presents with chest pain. His troponins have been negative and his ECG is unchanged. He had a LHC in 9d prior which showed patent stent and non-obstructive disease. Low suspicion of ACS at this point, though he is bothered by his apparently stable anginal Sx at home. He clearly has Sx of GERD that are also contributing as well as significant anxiety.     PLAN:  - please check a 3rd troponin, if negative, okay for DC home to F/U w his primary cardiologist Dr. Audery Amel at [facility]  - given his recent LHC, no further ischemic w/u needed at this time  - continue  ASA and ticagrelor  - off metoprolol due to bradycardia  - might benefit from anti-anginal Tx w amlodipine 5mg   - continue GERD Tx w omeprazole 30mg  BID plus PRN tums    Will see pt and discuss above w Cardiology attending Dr. Ane Payment, his recommendations to follow.     Loren Racer, MD  Med Peds R2    I saw and evaluated the patient. I have reviewed and edited the resident's/fellow's note and confirm the findings and plan of care as documented above. Pt. With somewhat atypical CP . He does have a small D2 with 70-80% stenosis. At this point I we will trial him on Amlodipine 5 mg and see if his symptoms improve.    Shon Millet, MD

## 2011-07-31 NOTE — ED Notes (Signed)
Pt d/c instructions reviewed with patient with understanding.    Pt to follow-up with Dr. Deatra Canter.  Pt to return if symptoms return.

## 2011-07-31 NOTE — ED Notes (Signed)
Pt resting comfortably, no complaints at this time. Oriented to unit, call light and plan. Ambulates independently. Will continue to monitor and treat per orders.

## 2011-08-01 LAB — EKG 12-LEAD
P: 52 degrees
QRS: 11 degrees
Rate: 71 {beats}/min
Severity: ABNORMAL
Severity: ABNORMAL
T: 61 degrees

## 2011-08-05 NOTE — Progress Notes (Signed)
Pt last here for Cardiac Rehab session 07/01/11. Pt has not returned repeated phone calls since that time, apparently has been admitted X 2 and underwent cardiac catheterization. I will change patients status in the program  to inactive until we can make contact with him regarding his plans to return.

## 2011-08-07 NOTE — Progress Notes (Signed)
S:  [name] returns to the office for follow up of his hypertension, hypercholesterolemia and CAD.    He continues with his usual medications.  He continues to follow up with cardiologist, Dr. Audery Amel.  He continues to experience episodic chest pain and reports that he is taking 2-3 tablets of sublingual nitroglycerin per week.  He is notifying Dr. Torrie Mayers office each time he takes the nitroglycerin.  His symptoms are not associated with exertion.  He denies any associated palpitations, shortness of breath or sweats.  His chest pain will feel like a stabbing sensation or tightness.  Symptoms typically last anywhere from a few seconds to a few minutes in duration.    He denies any problems with headaches, dizziness, vision changes, heartburn, nausea, diarrhea, constipation, bleeding problems or fatigue.  He has remained off of the cigarettes.    His low back continues to be present on a daily basis.  It waxes and wanes in severity.  It is worse with increased physical activity and improves with rest.  He does not feel that he has made any significant progress with this during the past 2 months and would like to consider further options for management of this problem.  Pain does not radiate to his buttocks, hips or legs.  He denies any numbness, tingling or weakness in his legs or feet.  He continues to remain off from work and does not feel ready to return to work because of this pain.    He continues with the sertraline on a daily basis.  Despite the use of this medication he has been experiencing frequent and significant anxiety as a result of his recent health problems.  He often feels overwhelmed with his anxiety and wonders if we could try a higher dose of the sertraline.  His sleep patterns are fluctuant, his energy level is fluctuant, his appetite is fluctuant and he is having difficulty finding enjoyment in his life.  He has difficulty concentrating when his anxiety symptoms are elevated.  He  experiences feelings of guilt regarding his struggles.  He denies any excessive restlessness, excessive clumsiness as well as any suicidal or homicidal ideation.    O:  General: Alert, appropriate, pleasant man in no apparent distress.  Neck: Supple, no lymphadenopathy, no thyromegaly, 2+ carotid pulses bilaterally.  Heart: Regular rate and rhythm, no murmur.  Lungs: Clear to auscultation bilaterally.  Abdomen: Positive bowel sounds, soft, nontender, nondistended, no masses, no organomegaly.  Extremities: 2+ radial and 2+ posterior tibial pulses bilaterally.  No clubbing, cyanosis or edema.  MSK: Mild tenderness over the perispinous muscles of the lumbar spine bilaterally.  Forward flexion of the back to 80 from vertical extension to 10. Lateral flexion to 30 shoulder rotation to 80 bilaterally.   Neuro: No sensory or motor deficits of the lower extremities.  Brisk, uniform patellar tendon and Achilles tendon reflexes bilaterally.  5/5 muscle strength in the flexors and extensors of the hips, knees and ankles bilaterally.  Psych: Neurovegetative signs and symptoms as described above.    Hospital Outpatient Visit on 07/02/2011   Component Date Value Range Status    Cholesterol 07/02/2011 123   Final    Comment: REFERENCE RANGE:  < 200 Desirable                                           200-239 Borderline High                                             >  240 High    Triglycerides 07/02/2011 155*  Final    Comment: REFERENCE RANGE:  < 150 Normal                                           150-199 Borderline High                                           200-499 High                                             > 500 Very High    HDL 07/02/2011 37   Final    Comment: REFERENCE RANGE:  < 40 Low                                             > 60 High    LDL Calculated 07/02/2011 55   Final    Comment: REFERENCE RANGE:  < 100 Optimal                                           100-129 Near or above optimal                                            130-159 Borderline High                                           160-189 High                                             > 189 Very High    Non HDL Cholesterol 07/02/2011 86   Final    Target is 30mg /dl above(or over) LDL goal    Chol/HDL Ratio 07/02/2011 3.3   Final    Sodium 07/02/2011 142  133 - 145 mmol/L Final    Potassium 07/02/2011 4.5  3.3 - 5.1 mmol/L Final    Chloride 07/02/2011 103  96 - 108 mmol/L Final    CO2 07/02/2011 27  20 - 28 mmol/L Final    Anion Gap 07/02/2011 12  7 - 16 Final    UN 07/02/2011 14  6 - 20 mg/dL Final    Creatinine 87/56/4332 0.80  0.67 - 1.17 mg/dL Final    GFR,Caucasian 07/02/2011 > 59   Final    An eGFR >59 does not exclude kidney disease.    GFR,Black 07/02/2011 > 59   Final    Comment: An eGFR >59 does not exclude kidney disease.                           *  UNITS=mL/min/1.73 square meters    Glucose 07/02/2011 99  60 - 99 mg/dL Final    Comment: Reference Ranges apply only to FASTING samples.                                                      ADA Guidelines Blood Sugar Levels for Diagnosing Diabetes & Pre-diabetes                           Normal: < 100 mg/dL                           Impaired Fasting Glucose (IFG): 100-125 mg/dL                           Diabetes:  > 126 mg/dL on two different occasions    Calcium 07/02/2011 9.3  8.6 - 10.2 mg/dL Final    Total Protein 07/02/2011 6.9  6.3 - 7.7 g/dL Final    Albumin 16/03/9603 4.8  3.5 - 5.2 g/dL Final    Bilirubin,Total 07/02/2011 0.3  0.0 - 1.2 mg/dL Final    AST 54/02/8118 29  0 - 50 U/L Final    ALT 07/02/2011 35  0 - 50 U/L Final    Alk Phos 07/02/2011 81  40 - 130 U/L Final     A:  1.  Essential hypertension -- well controlled  2.  Hypercholesterolemia -- satisfactory control  3.  CAD -- recurrent angina  4.  Low back pain -- persisting  5.  Anxiety disorder -- not adequately managed    P:  1.  Increase sertraline to 200 mg once daily.  2.  Continue with other  medications as previously prescribed.  3.  Continue to maintain efforts on prudent diet, exercise and weight management.  4.  Continue to maintain close contact with cardiologist, Dr. Audery Amel  5.  Continue to remain off from work due to the low back pain.  6.  Refer for physical therapy for further evaluation and management of low back pain.  7.  RTO in 3 weeks for follow up of low back pain or sooner if any other problems or concerns.  8.  RTO in 3 months for follow up of hypertension and hypercholesterolemia.  9.  Complete blood work prior to that appointment to check an FLP and CMP.

## 2011-08-08 ENCOUNTER — Encounter: Payer: Self-pay | Admitting: Primary Care

## 2011-08-08 ENCOUNTER — Ambulatory Visit: Payer: Self-pay | Admitting: Primary Care

## 2011-08-08 VITALS — BP 118/78 | Ht 72.0 in | Wt 190.0 lb

## 2011-08-08 DIAGNOSIS — F419 Anxiety disorder, unspecified: Secondary | ICD-10-CM

## 2011-08-08 DIAGNOSIS — K219 Gastro-esophageal reflux disease without esophagitis: Secondary | ICD-10-CM

## 2011-08-08 DIAGNOSIS — M545 Low back pain, unspecified: Secondary | ICD-10-CM

## 2011-08-08 DIAGNOSIS — I2510 Atherosclerotic heart disease of native coronary artery without angina pectoris: Secondary | ICD-10-CM

## 2011-08-08 DIAGNOSIS — I10 Essential (primary) hypertension: Secondary | ICD-10-CM

## 2011-08-08 MED ORDER — OMEPRAZOLE 20 MG PO CPDR *I*
20.0000 mg | DELAYED_RELEASE_CAPSULE | Freq: Two times a day (BID) | ORAL | Status: DC
Start: 2011-08-08 — End: 2012-03-17

## 2011-08-18 ENCOUNTER — Telehealth: Payer: Self-pay | Admitting: Primary Care

## 2011-08-18 NOTE — Telephone Encounter (Signed)
Pt received a job offer, light duty compared to last job. 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 Pt would like to be cleared to return to work.

## 2011-08-25 ENCOUNTER — Other Ambulatory Visit: Payer: Self-pay | Admitting: Primary Care

## 2011-08-25 DIAGNOSIS — R110 Nausea: Secondary | ICD-10-CM

## 2011-08-25 MED ORDER — PROCHLORPERAZINE MALEATE 10 MG PO TABS *I*
10.0000 mg | ORAL_TABLET | Freq: Three times a day (TID) | ORAL | Status: DC | PRN
Start: 2011-08-25 — End: 2012-06-21

## 2011-08-28 NOTE — Progress Notes (Signed)
S:  [name] returns to the office for follow up of his hypertension, CAD, low back pain and anxiety disorder.    He was seen in the [facility] ED last week due to recurrent chest pain.  He ruled out for myocardial infarction.  He was initiated on amlodipine and isosorbide.  Unfortunately he reports that each time he takes the isosorbide he experiences a painful headache.  Despite this he has noticed a reduction in the frequency and intensity of his chest pain.  He would rather put up with the headaches than risk further chest pain.  He notes that he has had frequent heartburn symptoms since initiating these medications.    He did increase his dose of sertraline as we had discussed at his last appointment.  He believes that it has helped his anxiety a little although his symptoms are easily triggered by his episodes of chest pain.  He is satisfied to continue the same for now.  His sleep patterns are fluctuant, his energy level is fluctuant, his appetite is fluctuant and he is having difficulty finding enjoyment in his life.  He has difficulty concentrating when his anxiety symptoms are elevated.  He experiences feelings of guilt regarding his struggles.  He denies any excessive restlessness, excessive clumsiness as well as any suicidal or homicidal ideation.    He otherwise continues with his usual medications.  He continues to follow up with cardiologist, Dr. Audery Amel and is scheduled to see him on 09/02/11.  He notes that his episodic chest pain is not associated with exertion.  He denies any associated palpitations, shortness of breath or sweats.  The chest pain typically feels like a stabbing sensation or tightness.  Symptoms usually last a few seconds to a few minutes in duration.    He denies any problems with dizziness, vision changes, nausea, diarrhea, constipation, bleeding problems or fatigue.  He has remained off of the cigarettes.    His low back continues to be present on a daily basis.  It waxes and  wanes in severity.  It is worse with increased physical activity and improves with rest.  He has only been able to get to physical therapy once since his last appointment.  This is due to his recurrent chest pain and trips to the hospital.  He does not feel that he has made any significant progress with this and does not feel prepared to return to work.  The pain does not radiate to his buttocks, hips or legs.  He denies any numbness, tingling or weakness in his legs or feet.    O:  General: Alert, appropriate, pleasant man in no apparent distress.  Neck: Supple, no lymphadenopathy, no thyromegaly, 2+ carotid pulses bilaterally.  Heart: Regular rate and rhythm, no murmur.  Lungs: Clear to auscultation bilaterally.  Abdomen: Positive bowel sounds, soft, nontender, nondistended, no masses, no organomegaly.  Extremities: 2+ radial and 2+ posterior tibial pulses bilaterally.  No clubbing, cyanosis or edema.  MSK: Mild tenderness over the perispinous muscles of the lumbar spine bilaterally.  Forward flexion of the back to 80 from vertical extension to 10. Lateral flexion to 30 shoulder rotation to 80 bilaterally.   Neuro: No sensory or motor deficits of the lower extremities.  Brisk, uniform patellar tendon and Achilles tendon reflexes bilaterally.  5/5 muscle strength in the flexors and extensors of the hips, knees and ankles bilaterally.  Psych: Neurovegetative signs and symptoms as described above.    A:  1.  Essential hypertension --  well controlled  2.  CAD -- reduced angina since initiating amlodipine and isosorbide  3.  Low back pain -- unchanged  4.  Anxiety disorder -- improved  5.  GERD -- not adequately managed    P:  1.  Increase omeprazole to 20 mg twice daily.  2.  Continue with other medications as previously prescribed.  3.  Continue to maintain efforts on prudent diet, exercise and weight management.  4.  Continue to maintain close contact with cardiologist, Dr. Audery Amel  5.  Continue to remain off  from work due to the low back pain.  6.  Reinitiate physical therapy for further management of the low back pain.  7.  RTO in 1 month for follow up of low back pain or sooner if any other problems or concerns.

## 2011-09-02 ENCOUNTER — Ambulatory Visit: Payer: Self-pay | Admitting: Cardiology

## 2011-09-03 ENCOUNTER — Other Ambulatory Visit: Payer: Self-pay | Admitting: Allergy and Immunology

## 2011-09-03 ENCOUNTER — Ambulatory Visit: Payer: Self-pay | Admitting: Primary Care

## 2011-09-03 MED ORDER — AMLODIPINE BESYLATE 5 MG PO TABS *I*
5.0000 mg | ORAL_TABLET | Freq: Every day | ORAL | Status: DC
Start: 2011-09-03 — End: 2012-08-30

## 2011-09-08 ENCOUNTER — Other Ambulatory Visit: Payer: Self-pay | Admitting: Allergy and Immunology

## 2011-09-08 MED ORDER — ISOSORBIDE MONONITRATE CR 30 MG PO TB24 *I*
30.0000 mg | ORAL_TABLET | Freq: Every day | ORAL | Status: DC
Start: 2011-09-08 — End: 2012-09-08

## 2011-09-16 ENCOUNTER — Ambulatory Visit: Payer: Self-pay | Admitting: Cardiology

## 2011-09-16 ENCOUNTER — Encounter: Payer: Self-pay | Admitting: Cardiology

## 2011-09-16 ENCOUNTER — Ambulatory Visit
Admit: 2011-09-16 | Discharge: 2011-09-16 | Disposition: A | Discharge status: Home / Self Care | Payer: Self-pay | Source: Ambulatory Visit | Attending: Cardiology | Admitting: Cardiology

## 2011-09-16 VITALS — BP 120/70 | HR 68 | Ht 72.0 in | Wt 180.0 lb

## 2011-09-16 DIAGNOSIS — R079 Chest pain, unspecified: Secondary | ICD-10-CM

## 2011-09-16 NOTE — Progress Notes (Signed)
Reason for visit: Chest pain    History of Present Illness:  We had the pleasure of seeing [name] today in our [facility].  He is a 46 y.o. with tobacco abuse and anxiety that sustained an anterior STEMI on 04/04/11.  He received thrombectomy and successful revascularization with a bare-metal stent to his mid left anterior descending artery.  Over then next several months, Garren had multiple emergency department visits for atypical symptoms.  This prompted nuclear stress testing and a repeat angiogram that was unchanged from previous study.  We suspected that the bulk of his symptoms were from GERD, anxiety, or somatization.  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  Since early February, he has had only one episode of chest discomfort that resolved with a single nitroglycerin tab.  He does not have a decline in exercise tolerance, but has stopped attending cardiac rehabilitation.  He acknowledges intermittent palpitations, but has not had sustained symptoms.          Past Medical History   Diagnosis Date   . Prostatitis    . Anxiety    . Benign prostatic hypertrophy    . Depression    . Discogenic syndrome    . Nicotine dependence    . Myocardial infarction 04/04/11     stemi   . Coronary artery disease    . Cardiac arrest      Past Surgical History   Procedure Date   . Ankle surgery    . Coronary angioplasty with stent placement 04/04/11     BMS to LAD   . Coronary angioplasty 04/04/11     with BMS to LAD   . Ankle surgery    . Cardiac catherization 07/2011     mild nonobstructive disease-stent patent         Current Outpatient Prescriptions   Medication Sig   . isosorbide mononitrate (IMDUR) 30 MG 24 hr tablet Take 1 tablet (30 mg total) by mouth daily   Swallow whole. Do not crush, break, or chew.   [name] amLODIPine (NORVASC) 5 MG tablet Take 1 tablet (5 mg total) by mouth daily   .  prochlorperazine (COMPAZINE) 10 MG tablet Take 1 tablet (10 mg total) by mouth 3 times daily as needed for Nausea   . omeprazole (PRILOSEC) 20 MG capsule Take 1 capsule (20 mg total) by mouth 2 times daily   . sertraline (ZOLOFT) 100 MG tablet Take 200 mg by mouth daily      . lisinopril (PRINIVIL,ZESTRIL) 5 MG tablet Take 1 tablet (5 mg total) by mouth daily   . atorvastatin (LIPITOR) 40 MG tablet Take 1 tablet (40 mg total) by mouth every evening   . cyclobenzaprine (FLEXERIL) 10 MG tablet take 1 tablet by mouth three times a day if needed   . nitroglycerin (NITROSTAT) 0.4 MG SL tablet Place 0.4 mg under the tongue every 5 minutes as needed   May repeat 2 times then call 911 if pain persists.    [name] aspirin 81 MG EC tablet Take 1 tablet (81 mg total) by mouth daily     . Ticagrelor (BRILINTA) 90 MG TABS tablet Take 1 tablet (90 mg total) by mouth every 12 hours         Allergies   Allergen Reactions   . Latex      Created by Conversion - 0;    . No Known Drug  Allergy      Created by Conversion - 0;        Family History   Problem Relation Age of Onset   . Diabetes Mother    . High cholesterol Mother    . Heart disease Father    . Diabetes Father        History     Social History   . Marital Status: Married     Spouse Name: N/A     Number of Children: N/A   . Years of Education: N/A     Social History Main Topics   . Smoking status: Former Smoker -- 0.8 packs/day for 24 years     Types: Cigarettes     Quit date: 04/04/2011   . Smokeless tobacco: Former Neurosurgeon   . Alcohol Use: No   . Drug Use: No   . Sexually Active: None     Other Topics Concern   . None     Social History Narrative   . None         Review of Systems:    General: No significant weight change, fatigue, fevers  Skin: No new lesions  Eyes: Glasses  ENT: Oral sore after sublingual nitroglycerin  Cardiovascular: HPI  Pulmonary: HPI  GI: No nausea, vomiting, or change in bowel habits  GU: No dysuria or polyuria  Heme: Easy bruisability   Endo: No DM, thyroid  normal  Musculoskeletal: Chronic back pains  Neuro: No headaches, seizures, stroke symptoms      PHYSICAL EXAM:  BP 120/70  Pulse 68  Ht 1.829 m (6')  Wt 81.647 kg (180 lb)  BMI 24.41 kg/m2  General: alert, full, NAD  HEENT: anicteric, MMM, no E/E OP, conj pink, no arcus senilis   Neck: no JVD, bruits, or LAD  CV: RRR, ns1/s2, no murmurs, rubs, or gallops  Pulm: CTA B  Abd: soft, NT, ND, +BS  Ext: no edema or cyanosis, 2+ distal pulses  Neuro: no gross focal deficits  Skin: ecchymosis on left forearm    Coronary Angiogram (04/04/11):  One vessel coronary artery disease (LAD). Mildly reduced left ventricular function. Successful percutaneous coronary intervention (bare metal stenting) of the left anterior descending artery. Succesful clot extraction form D2.     Coronary Angiogram (07/22/11):  Mild (non-obstructive) coronary artery disease. Normal LV systolic function.     SPECT (04/11/11):  PERFUSION: Myocardial perfusion SPECT imaging demonstrates: (1) Slight vasodilator stress induced ischemia of the apex and apical anterior region involving 4% LV mass superimposed on (2) partial thickness infarction of this region extending to the mid-anterior wall involving 3% LV mass. (3) Consider benefits and risks of aggressive medical therapy, if clinically indicated.   FUNCTION: ECG-gated SPECT myocardial wall motion study shows: (1) Upper normal LV volume and low normal EF are noted with evidence of mild to moderate dysfunction of the apex, peri-apex and mild dysfunction of the anterolateral wall. (2) RV size and function appear normal. (3) No prior study is available for comparison.     VScan (04/25/11): Normal LVEF without evidence of regional dysfunction.  No pericardial thickening or effusion.  Right ventricle appears normal in size and function.    Assessment & Plan:   [name] is a 46 y.o. with prior anterior STEMI that was seen today for follow-up.      1) Chest pains & Coronary artery disease: Tyvone is  feeling much better since starting work and I suspect anxiety was playing a large role  with his atypical chest pains.  Repeat angiogram confirmed patent left anterior descending artery stent.  There was suggestion of a small diagonal branch may be obstructed, but caliber was not amendable to revascularization.  It is unlikely that this is causing his chest pain due to atypical description and normal perfusion imaging.  We will continue with current regimen.   - Continue aspirin, ticagrelor, lisinopril, amlodipine,and atorvastatin   - Can likely discontinue dual antiplatelet therapy upon next visit   - SL NG as needed   - Chest pain log of symptoms    2) Hypertension:  Systolic and diastolic blood pressure are at goal today on current regimen.    - Continue current medications   - Low sodium diet (<4 g/day)   - Electrolytes within normal range (07/30/11)    3) Hyperlipidemia: LDL and non-HDL below goal of <70 and <308 mg/dL on recent blood work (6/57/84).    - Continue current atorvastatin dosing    - Obtain fasting lipid panel annually      [name], MD  Cardiovascular Disease    Suggest follow-up in 6 months.  Thank you for allowing [place] to participate in this patient's care.  Please call or e-mail ([email or web address]) with any questions or concerns regarding his care.

## 2011-09-18 ENCOUNTER — Telehealth: Payer: Self-pay
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
## 2011-09-18 NOTE — Telephone Encounter (Signed)
Done

## 2011-10-25 ENCOUNTER — Ambulatory Visit
Admit: 2011-10-25 | Discharge: 2011-10-25 | Disposition: A | Discharge status: Home / Self Care | Payer: Self-pay | Source: Ambulatory Visit | Attending: Primary Care | Admitting: Primary Care

## 2011-10-25 DIAGNOSIS — E7800 Pure hypercholesterolemia, unspecified: Secondary | ICD-10-CM

## 2011-10-25 DIAGNOSIS — I10 Essential (primary) hypertension: Secondary | ICD-10-CM

## 2011-10-25 LAB — COMPREHENSIVE METABOLIC PANEL
ALT: 31 U/L (ref 0–50)
AST: 34 U/L (ref 0–50)
Albumin: 4.8 g/dL (ref 3.5–5.2)
Alk Phos: 74 U/L (ref 40–130)
Anion Gap: 7 (ref 7–16)
Bilirubin,Total: 0.4 mg/dL (ref 0.0–1.2)
CO2: 28 mmol/L (ref 20–28)
Calcium: 9.4 mg/dL (ref 8.6–10.2)
Chloride: 104 mmol/L (ref 96–108)
Creatinine: 0.8 mg/dL (ref 0.67–1.17)
GFR,Black: 123 *
GFR,Caucasian: 106 *
Glucose: 99 mg/dL (ref 60–99)
Lab: 16 mg/dL (ref 6–20)
Potassium: 4.2 mmol/L (ref 3.3–5.1)
Sodium: 139 mmol/L (ref 133–145)
Total Protein: 7 g/dL (ref 6.3–7.7)

## 2011-10-25 LAB — LIPID PANEL
Chol/HDL Ratio: 2.4
Cholesterol: 120 mg/dL
HDL: 49 mg/dL
LDL Calculated: 58 mg/dL
Non HDL Cholesterol: 71 mg/dL
Triglycerides: 67 mg/dL

## 2011-10-27 ENCOUNTER — Ambulatory Visit: Payer: Self-pay | Admitting: Primary Care

## 2011-10-27 ENCOUNTER — Encounter: Payer: Self-pay | Admitting: Primary Care

## 2011-10-27 VITALS — BP 118/72 | HR 72 | Resp 12 | Ht 72.0 in | Wt 182.0 lb

## 2011-10-27 DIAGNOSIS — I208 Other forms of angina pectoris: Secondary | ICD-10-CM

## 2011-10-27 DIAGNOSIS — I2510 Atherosclerotic heart disease of native coronary artery without angina pectoris: Secondary | ICD-10-CM

## 2011-10-27 DIAGNOSIS — I10 Essential (primary) hypertension: Secondary | ICD-10-CM

## 2011-10-27 DIAGNOSIS — E7800 Pure hypercholesterolemia, unspecified: Secondary | ICD-10-CM

## 2011-12-14 NOTE — Progress Notes (Signed)
S:  [name] returns to the office for follow up of his hypertension, hypercholesterolemia and CAD.    He did increase his dose of the omeprazole as we had discussed at his last appointment.  He otherwise continues with his usual medications.  He denies any problems with headaches, dizziness, vision changes, palpitations, shortness of breath, heartburn, nausea, diarrhea, constipation, bleeding problems or fatigue.    He continues to experience occasional episodes of central chest pain, consistent with stable angina.  One episode occurred while preparing for work one morning, 10 days ago.  He took his nitroglycerin 2 times within 5 minutes and his symptoms subsided.  Another episode occurred when climbing on a ladder 4 days ago.  He took 1 nitroglycerin tablet and his symptoms subsided.  Overall he admits that his episodes of angina have reduced in frequency and severity.  He notifies the office of his cardiologist, Dr. Audery Amel on every occasion when these symptoms occur.    O:  General: Alert, appropriate, pleasant man in no apparent distress.  Neck: Supple, no lymphadenopathy, no thyromegaly, 2+ carotid pulses bilaterally.  Heart: Regular rate and rhythm, no murmur.  Lungs: Clear to auscultation bilaterally.  Abdomen: Positive bowel sounds, soft, nontender, nondistended, no masses, no organomegaly.  Extremities: 2+ radial and 2+ posterior tibial pulses bilaterally.  No clubbing, cyanosis or edema.    Hospital Outpatient Visit on 10/25/2011   Component Date Value Range Status   . Cholesterol 10/25/2011 120   Final    Comment: REFERENCE RANGE:  < 200 Desirable                                           200-239 Borderline High                                             > 240 High   . Triglycerides 10/25/2011 67   Final    Comment: REFERENCE RANGE:  < 150 Normal                                           150-199 Borderline High                                           200-499 High                                              > 500 Very High   . HDL 10/25/2011 49   Final    Comment: REFERENCE RANGE:  < 40 Low                                             > 60 High   . LDL Calculated 10/25/2011 58   Final    Comment: REFERENCE RANGE:  < 100 Optimal  100-129 Near or above optimal                                           130-159 Borderline High                                           160-189 High                                             > 189 Very High   . Non HDL Cholesterol 10/25/2011 71   Final    Target is 30mg /dl above(or over) LDL goal   . Chol/HDL Ratio 10/25/2011 2.4   Final   . Sodium 10/25/2011 139  133 - 145 mmol/L Final   . Potassium 10/25/2011 4.2  3.3 - 5.1 mmol/L Final   . Chloride 10/25/2011 104  96 - 108 mmol/L Final   . CO2 10/25/2011 28  20 - 28 mmol/L Final   . Anion Gap 10/25/2011 7  7 - 16 Final   . UN 10/25/2011 16  6 - 20 mg/dL Final   . Creatinine 45/40/9811 0.80  0.67 - 1.17 mg/dL Final   . GFR,Caucasian 10/25/2011 106   Final   . GFR,Black 10/25/2011 123   Final    *UNITS=mL/min/1.73 square meters   . Glucose 10/25/2011 99  60 - 99 mg/dL Final    Comment: Reference Ranges apply only to FASTING samples.                                                      ADA Guidelines Blood Sugar Levels for Diagnosing Diabetes & Pre-diabetes                           Normal: < 100 mg/dL                           Impaired Fasting Glucose (IFG): 100-125 mg/dL                           Diabetes:  > 126 mg/dL on two different occasions   . Calcium 10/25/2011 9.4  8.6 - 10.2 mg/dL Final   . Total Protein 10/25/2011 7.0  6.3 - 7.7 g/dL Final   . Albumin 91/47/8295 4.8  3.5 - 5.2 g/dL Final   . Bilirubin,Total 10/25/2011 0.4  0.0 - 1.2 mg/dL Final   . AST 62/13/0865 34  0 - 50 U/L Final   . ALT 10/25/2011 31  0 - 50 U/L Final   . Alk Phos 10/25/2011 74  40 - 130 U/L Final       EKG obtained in the office today identified NSR with RBBB and inverted T waves in V2.    A:  1.  Essential  hypertension -- well controlled  2.  Hypercholesterolemia -- well controlled  3.  CAD  4.  Stable  angina    P:  1.  Continue with current medications at their current doses.  2.  Continue to maintain efforts on prudent diet, exercise and weight management.  3.  Continue to maintain close contact with cardiologist, Dr. Audery Amel  4.  RTO in 3 months for follow up of the above problems or sooner if any other problems or concerns.  5.  Complete blood work prior to the next appointment to check an FLP and CMP.

## 2011-12-15 ENCOUNTER — Telehealth: Payer: Self-pay

## 2011-12-15 NOTE — Telephone Encounter (Signed)
Couple of episodes of palpitations lasting about one to one and a half minutes.  Chest discomfort and arm pain on and off; seems to occur with exertion and respond to NTG.  Will bring him in tomorrow at 10:00 am with Pedulla.

## 2011-12-15 NOTE — Telephone Encounter (Signed)
Patient has been experiencing palpitations over the last week along with accompanying radiating pain across his chest and shoulder blade and down arm with intermittent numbness and tingling.   He can be reached at [phone].

## 2011-12-16 ENCOUNTER — Ambulatory Visit: Payer: Self-pay | Admitting: Cardiology

## 2011-12-16 ENCOUNTER — Other Ambulatory Visit: Payer: Self-pay | Admitting: Gastroenterology

## 2011-12-16 ENCOUNTER — Encounter: Payer: Self-pay | Admitting: Cardiology

## 2011-12-16 VITALS — BP 118/80 | HR 62 | Ht 72.0 in | Wt 180.0 lb

## 2011-12-16 DIAGNOSIS — I2510 Atherosclerotic heart disease of native coronary artery without angina pectoris: Secondary | ICD-10-CM

## 2011-12-16 DIAGNOSIS — E785 Hyperlipidemia, unspecified: Secondary | ICD-10-CM

## 2011-12-16 DIAGNOSIS — I10 Essential (primary) hypertension: Secondary | ICD-10-CM

## 2011-12-16 DIAGNOSIS — R079 Chest pain, unspecified: Secondary | ICD-10-CM

## 2011-12-16 NOTE — Progress Notes (Signed)
Reason for visit: Chest pain    History of Present Illness:  We had the pleasure of seeing [name] today in our [facility].  He is a 46 y.o. with tobacco abuse and anxiety that sustained an anterior STEMI on 04/04/11.  He received thrombectomy and successful revascularization with a bare-metal stent to his mid left anterior descending artery.  Over then next several months, Manraj had multiple emergency department visits for atypical symptoms.  This prompted nuclear stress testing and a repeat angiogram that was unchanged from previous study.  We suspected that the bulk of his symptoms were from GERD, anxiety, or somatization.  Last visit in April, Kobee had been doing very well and was quite happy with his new job.  This temporally correlated with a marked improvement in chest discomfort and a decline in nitroglycerin usage.    Unfortunately, in the last 6 weeks she has been having recurrent symptoms.  They are very atypical and inconsistent.  Some episodes extend into neck and at other times into the arm/hand.  They occasionally are associated with palpitations and nausea, while other times with hand or neck paresthesias.  They last several minutes to 2.5 hours in duration.  He can not temporally correlate onset with activity, time of day, meals, position, or work exposures.  This does correlate with heightened personal and professional emotional stress.  He has had no relief with antacids, but sublingual nitroglycerin improves discomfort 10-15 minutes after administration.  He has been able to ambulate 2 miles without reproduction of symptoms within the last week.      He also describes an increase in palpitations over the last week.  Symptoms are brief, never lasting for more than 1 minute.  They occur sporadically and he cannot identify dissociation.  There is no concomitant lightheadedness or syncope.      Past Medical History   Diagnosis Date    . Prostatitis    . Anxiety    . Benign prostatic hypertrophy    . Depression    . Discogenic syndrome    . Nicotine dependence    . Myocardial infarction 04/04/11     stemi   . Coronary artery disease    . Cardiac arrest    . Chest pain, unspecified      Past Surgical History   Procedure Laterality Date   . Ankle surgery     . Coronary angioplasty with stent placement  04/04/11     BMS to LAD   . Coronary angioplasty  04/04/11     with BMS to LAD   . Ankle surgery     . Cardiac catherization  07/2011     mild nonobstructive disease-stent patent         Current Outpatient Prescriptions   Medication Sig   . isosorbide mononitrate (IMDUR) 30 MG 24 hr tablet Take 1 tablet (30 mg total) by mouth daily   Swallow whole. Do not crush, break, or chew.   [name] amLODIPine (NORVASC) 5 MG tablet Take 1 tablet (5 mg total) by mouth daily   . prochlorperazine (COMPAZINE) 10 MG tablet Take 1 tablet (10 mg total) by mouth 3 times daily as needed for Nausea   . omeprazole (PRILOSEC) 20 MG capsule Take 1 capsule (20 mg total) by mouth 2 times daily   . sertraline (ZOLOFT) 100 MG tablet Take 200 mg by mouth daily      . lisinopril (PRINIVIL,ZESTRIL) 5 MG tablet Take 1 tablet (5 mg total)  by mouth daily   . atorvastatin (LIPITOR) 40 MG tablet Take 1 tablet (40 mg total) by mouth every evening   . cyclobenzaprine (FLEXERIL) 10 MG tablet take 1 tablet by mouth three times a day if needed   . nitroglycerin (NITROSTAT) 0.4 MG SL tablet Place 0.4 mg under the tongue every 5 minutes as needed   May repeat 2 times then call 911 if pain persists.    [name] aspirin 81 MG EC tablet Take 1 tablet (81 mg total) by mouth daily     . Ticagrelor (BRILINTA) 90 MG TABS tablet Take 1 tablet (90 mg total) by mouth every 12 hours         Allergies   Allergen Reactions   . Latex      Created by Conversion - 0;    . No Known Drug Allergy      Created by Conversion - 0;        Family History   Problem Relation Age of Onset   . Diabetes Mother    . High cholesterol  Mother    . Heart disease Father    . Diabetes Father        History     Social History   . Marital Status: Married     Spouse Name: N/A     Number of Children: N/A   . Years of Education: N/A     Social History Main Topics   . Smoking status: Former Smoker -- 0.80 packs/day for 24 years     Types: Cigarettes     Quit date: 04/04/2011   . Smokeless tobacco: Former Neurosurgeon   . Alcohol Use: No   . Drug Use: No   . Sexually Active: None     Other Topics Concern   . None     Social History Narrative   . None         Review of Systems:    General: No significant weight change,+ fatigue  Skin: No new lesions  Eyes: Glasses  ENT: Oral sore after sublingual nitroglycerin  Cardiovascular: HPI  Pulmonary: HPI  GI: + nausea  GU: No dysuria or polyuria  Heme: Easy bruisability   Endo: No DM, thyroid normal  Musculoskeletal: Chronic back pains  Neuro: No headaches, seizures, stroke symptoms      PHYSICAL EXAM:  BP 118/80  Pulse 62  Ht 1.829 m (6')  Wt 81.647 kg (180 lb)  BMI 24.41 kg/m2  General: alert, full, NAD  HEENT: anicteric, MMM, no E/E OP, conj pink, no arcus senilis   Neck: no JVD, bruits, or LAD  CV: RRR, ns1/s2, no murmurs, rubs, or gallops  Pulm: CTA B  Abd: soft, NT, ND, +BS  Ext: no edema or cyanosis, 2+ distal pulses  Neuro: no gross focal deficits  Skin: ecchymosis on left forearm    Coronary Angiogram (04/04/11):  One vessel coronary artery disease (LAD). Mildly reduced left ventricular function. Successful percutaneous coronary intervention (bare metal stenting) of the left anterior descending artery. Succesful clot extraction form D2.     Coronary Angiogram (07/22/11):  Mild (non-obstructive) coronary artery disease. Normal LV systolic function.     SPECT (04/11/11):  PERFUSION: Myocardial perfusion SPECT imaging demonstrates: (1) Slight vasodilator stress induced ischemia of the apex and apical anterior region involving 4% LV mass superimposed on (2) partial thickness infarction of this region extending to the  mid-anterior wall involving 3% LV mass. (3) Consider benefits and risks of aggressive medical therapy,  if clinically indicated.   FUNCTION: ECG-gated SPECT myocardial wall motion study shows: (1) Upper normal LV volume and low normal EF are noted with evidence of mild to moderate dysfunction of the apex, peri-apex and mild dysfunction of the anterolateral wall. (2) RV size and function appear normal. (3) No prior study is available for comparison.     VScan (04/25/11): Normal LVEF without evidence of regional dysfunction.  No pericardial thickening or effusion.  Right ventricle appears normal in size and function.    Assessment & Plan:   [name] is a 46 y.o. with prior anterior STEMI that was seen today for atypical chest pains.      1) Chest pains & Coronary artery disease: Unfortunately, Deuntay has had progression in symptoms over the last 6 weeks.  Description is inconsistent and atypical for cardiac etiology.  I suspect that increase in professional stress and troubles with his father's health or contributing.  His electrocardiogram is very unremarkable today.  Recent repeat angiogram confirmed patent left anterior descending artery stent.  There was suggestion of a small diagonal branch may be obstructed, but caliber was not amendable to revascularization.  It is unlikely that this is causing his chest pain given atypical description and normal perfusion imaging.     - Continue aspirin, ticagrelor, lisinopril, amlodipine,and atorvastatin   - Can likely discontinue dual antiplatelet therapy upon next visit   - SL NG as needed   - Chest pain log of symptoms again encouraged    2) Hypertension:  Systolic and diastolic blood pressure are at goal today on current regimen.    - Continue current medications   - Low sodium diet (<4 g/day)   - Electrolytes within normal range (07/30/11)    3) Hyperlipidemia: LDL and non-HDL below goal of <70 and <213 mg/dL on recent blood work (0/86/57).    - Continue current  atorvastatin dosing    - Obtain fasting lipid panel annually    4) Palpitations:  Etiology likely secondary nonsustained ectopy or brief run of supraventricular tachycardia.  Keanen will check heart rate and regularity if recurs.  At this point, additional monitoring likely to be low yield.  We will arrange for Corventis monitor if they progress.    [name], MD  Cardiovascular Disease    Suggest follow-up in 6 months.  Thank you for allowing [place] to participate in this patient's care.  Please call or e-mail ([email or web address] with any questions or concerns regarding his care.

## 2011-12-19 NOTE — Telephone Encounter (Signed)
A user error has taken place: encounter opened in error, closed for administrative reasons.

## 2011-12-21 LAB — EKG 12-LEAD
P: 61 degrees
QRS: 7 degrees
Rate: 62 {beats}/min
Severity: NORMAL
Severity: NORMAL
T: 55 degrees

## 2012-01-13 ENCOUNTER — Telehealth: Payer: Self-pay | Admitting: Cardiology

## 2012-01-13 MED ORDER — LISINOPRIL 5 MG PO TABS *I*
5.0000 mg | ORAL_TABLET | Freq: Every day | ORAL | Status: DC
Start: 2012-01-13 — End: 2012-07-10

## 2012-01-13 MED ORDER — ATORVASTATIN CALCIUM 40 MG PO TABS *I*
40.0000 mg | ORAL_TABLET | Freq: Every day | ORAL | Status: DC
Start: 2012-01-13 — End: 2012-07-10

## 2012-01-21 LAB — EKG 12-LEAD
P: 39 degrees
P: 48 degrees
QRS: 16 degrees
QRS: 59 degrees
Rate: 54 {beats}/min
Rate: 54 {beats}/min
Severity: ABNORMAL
Severity: ABNORMAL
Severity: ABNORMAL
Severity: ABNORMAL
Statement: NORMAL
Statement: NORMAL
T: 110 degrees
T: 79 degrees

## 2012-01-26 ENCOUNTER — Encounter: Payer: Self-pay | Admitting: Primary Care

## 2012-01-26 ENCOUNTER — Ambulatory Visit
Admit: 2012-01-26 | Discharge: 2012-01-26 | Disposition: A | Discharge status: Home / Self Care | Payer: Self-pay | Source: Ambulatory Visit | Attending: Primary Care | Admitting: Primary Care

## 2012-01-26 ENCOUNTER — Ambulatory Visit: Payer: Self-pay | Admitting: Primary Care

## 2012-01-26 VITALS — BP 110/64 | HR 64 | Resp 14 | Ht 72.0 in | Wt 180.0 lb

## 2012-01-26 DIAGNOSIS — L259 Unspecified contact dermatitis, unspecified cause: Secondary | ICD-10-CM

## 2012-01-26 DIAGNOSIS — I2510 Atherosclerotic heart disease of native coronary artery without angina pectoris: Secondary | ICD-10-CM

## 2012-01-26 DIAGNOSIS — I10 Essential (primary) hypertension: Secondary | ICD-10-CM

## 2012-01-26 DIAGNOSIS — E7800 Pure hypercholesterolemia, unspecified: Secondary | ICD-10-CM

## 2012-01-26 LAB — LIPID PANEL
Chol/HDL Ratio: 2.9
Cholesterol: 118 mg/dL
HDL: 41 mg/dL
LDL Calculated: 52 mg/dL
Non HDL Cholesterol: 77 mg/dL
Triglycerides: 124 mg/dL

## 2012-01-26 LAB — COMPREHENSIVE METABOLIC PANEL
ALT: 31 U/L (ref 0–50)
AST: 27 U/L (ref 0–50)
Albumin: 4.8 g/dL (ref 3.5–5.2)
Alk Phos: 79 U/L (ref 40–130)
Anion Gap: 8 (ref 7–16)
Bilirubin,Total: 0.3 mg/dL (ref 0.0–1.2)
CO2: 29 mmol/L — ABNORMAL HIGH (ref 20–28)
Calcium: 9 mg/dL (ref 8.6–10.2)
Chloride: 103 mmol/L (ref 96–108)
Creatinine: 0.79 mg/dL (ref 0.67–1.17)
GFR,Black: 124 *
GFR,Caucasian: 107 *
Glucose: 110 mg/dL — ABNORMAL HIGH (ref 60–99)
Lab: 12 mg/dL (ref 6–20)
Potassium: 4.6 mmol/L (ref 3.3–5.1)
Sodium: 140 mmol/L (ref 133–145)
Total Protein: 7.1 g/dL (ref 6.3–7.7)

## 2012-01-26 MED ORDER — CLOBETASOL PROPIONATE 0.05 % EX OINT *I*
TOPICAL_OINTMENT | Freq: Two times a day (BID) | CUTANEOUS | Status: DC | PRN
Start: 2012-01-26 — End: 2012-02-24

## 2012-01-26 NOTE — Progress Notes (Signed)
S:  [name] returns to the office for follow up of his hypertension, hypercholesterolemia and CAD as well as with complaints of an itchy rash on his forearms for the past 5 days.    He continues with his usual medications.  He denies any problems with headaches, dizziness, vision changes, palpitations, shortness of breath, heartburn, nausea, diarrhea, constipation, bleeding problems or fatigue.    He continues to experience occasional episodes of central chest pain, consistent with stable angina.  These episodes seem to be gradually reducing in frequency and severity.    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  He believes that he may have been exposed to poison ivy or something similar during this time.  He believes that this is what triggered his current rash.  He has been using Caladryl and applying rubbing alcohol with some temporary benefit.    He just completed his blood work this morning so the results are not yet available to discuss.    O:  General: Alert, appropriate, pleasant man in no apparent distress.  Neck: Supple, no lymphadenopathy, no thyromegaly, 2+ carotid pulses bilaterally.  Heart: Regular rate and rhythm, no murmur.  Lungs: Clear to auscultation bilaterally.  Abdomen: Positive bowel sounds, soft, nontender, nondistended, no masses, no organomegaly.  Extremities: 2+ radial and 2+ posterior tibial pulses bilaterally.  No clubbing, cyanosis or edema.  Skin: There are patches and streaks of raised erythema seen on each of the forearms.  There are some individual erythematous papules as well.  There are no associated pustules, vesicles, crusts, nodules or ulcerations.  No warmth, induration, ecchymosis, tenderness, bleeding or discharge.    A:  1.  Essential hypertension -- well controlled  2.  Hypercholesterolemia -- well controlled  3.  CAD -- occasional stable angina  4.  Contact dermatitis    P:  1.  Initiate  clobetasol 0.05% ointment, apply to affected areas twice daily.  2.  Continue with current medications at their current doses.  3.  Continue to maintain efforts on prudent diet, exercise and weight management.  4.  I advised the patient that I will call him if there are any concerns in regards to his blood test results.  If he does not hear from me then he can assume that the results are good.  5.  RTO in 3 months for follow up of the above problems or sooner if any other problems or concerns.  6.  Complete blood work prior to the next appointment to check an FLP and CMP.

## 2012-02-19 ENCOUNTER — Ambulatory Visit
Admit: 2012-02-19 | Discharge: 2012-02-19 | Disposition: A | Discharge status: Home / Self Care | Payer: Self-pay | Source: Ambulatory Visit | Attending: Cardiology | Admitting: Cardiology

## 2012-02-19 ENCOUNTER — Telehealth: Payer: Self-pay

## 2012-02-19 ENCOUNTER — Ambulatory Visit: Payer: Self-pay

## 2012-02-19 DIAGNOSIS — R002 Palpitations: Secondary | ICD-10-CM

## 2012-02-19 NOTE — Telephone Encounter (Signed)
Has been using NTG about 4 times over the last month, palpitations are becoming very frequent, occuring on a daily basis.  Spoke with Ang--will obtain Holter monitor and schedule next week to see Ang in the office.  DX:  Chest pain, palpitations.

## 2012-02-19 NOTE — Telephone Encounter (Signed)
Dr. Audery Amel patient.  Chest pain going across shoulder into L arm and frequent palps for a week.

## 2012-02-21 ENCOUNTER — Other Ambulatory Visit: Payer: Self-pay | Admitting: Primary Care

## 2012-02-24 ENCOUNTER — Ambulatory Visit: Payer: Self-pay | Admitting: Cardiology

## 2012-02-24 ENCOUNTER — Encounter: Payer: Self-pay | Admitting: Cardiology

## 2012-02-24 ENCOUNTER — Ambulatory Visit
Admit: 2012-02-24 | Discharge: 2012-02-24 | Disposition: A | Discharge status: Home / Self Care | Payer: Self-pay | Source: Ambulatory Visit | Attending: Cardiology | Admitting: Cardiology

## 2012-02-24 VITALS — BP 118/68 | HR 76 | Ht 72.0 in | Wt 180.0 lb

## 2012-02-24 DIAGNOSIS — R002 Palpitations: Secondary | ICD-10-CM | POA: Insufficient documentation

## 2012-02-24 DIAGNOSIS — I10 Essential (primary) hypertension: Secondary | ICD-10-CM

## 2012-02-24 DIAGNOSIS — E785 Hyperlipidemia, unspecified: Secondary | ICD-10-CM

## 2012-02-24 DIAGNOSIS — I2510 Atherosclerotic heart disease of native coronary artery without angina pectoris: Secondary | ICD-10-CM

## 2012-02-24 NOTE — Progress Notes (Signed)
Reason for visit: Palpitations    History of Present Illness:  We had the pleasure of seeing [name] today in our [facility].  He is a 46 y.o. with tobacco abuse and anxiety that sustained an anterior STEMI on 04/04/11.  He received thrombectomy and successful revascularization with a bare-metal stent to his mid left anterior descending artery.  Over then next several months, [name] had multiple emergency department visits for atypical symptoms.  This prompted nuclear stress testing and a repeat angiogram that was unchanged from previous study.  He has been managed somewhat successfully with calcium channel blockers and nitrates.  [name] called the office last week with worsening palpitations.  Symptoms have been progressing over the last few weeks and are characterized as frequent and sustained skipped beats associated with lightheadedness and near syncope.  There is no correlation with activity, time of day, or position change.  He denies any caffeine, tobacco, alcohol, or stimulant usage.  This prompted a 24-hour Holter monitor that was reviewed yesterday.  [name] had several episodes of severe symptoms, which corresponded with sinus rhythm and very rare PVCs.        Past Medical History   Diagnosis Date   . Prostatitis    . Anxiety    . Benign prostatic hypertrophy    . Depression    . Discogenic syndrome    . Nicotine dependence    . Myocardial infarction 04/04/11     stemi   . Coronary artery disease    . Cardiac arrest    . Chest pain, unspecified    . Palpitations      Past Surgical History   Procedure Laterality Date   . Ankle surgery     . Coronary angioplasty with stent placement  04/04/11     BMS to LAD   . Coronary angioplasty  04/04/11     with BMS to LAD   . Ankle surgery     . Cardiac catheterization  07/2011     mild nonobstructive disease-stent patent         Current Outpatient Prescriptions   Medication Sig   . sertraline (ZOLOFT) 100  MG tablet take 2 tablets by mouth once daily   . lisinopril (PRINIVIL,ZESTRIL) 5 MG tablet Take 1 tablet (5 mg total) by mouth daily   . atorvastatin (LIPITOR) 40 MG tablet Take 1 tablet (40 mg total) by mouth daily (with dinner)   . isosorbide mononitrate (IMDUR) 30 MG 24 hr tablet Take 1 tablet (30 mg total) by mouth daily   Swallow whole. Do not crush, break, or chew.   [name] amLODIPine (NORVASC) 5 MG tablet Take 1 tablet (5 mg total) by mouth daily   . prochlorperazine (COMPAZINE) 10 MG tablet Take 1 tablet (10 mg total) by mouth 3 times daily as needed for Nausea   . omeprazole (PRILOSEC) 20 MG capsule Take 1 capsule (20 mg total) by mouth 2 times daily   . cyclobenzaprine (FLEXERIL) 10 MG tablet take 1 tablet by mouth three times a day if needed   . nitroglycerin (NITROSTAT) 0.4 MG SL tablet Place 0.4 mg under the tongue every 5 minutes as needed   May repeat 2 times then call 911 if pain persists.    [name] aspirin 81 MG EC tablet Take 1 tablet (81 mg total) by mouth daily     . Ticagrelor (BRILINTA) 90 MG TABS tablet Take 1 tablet (90 mg total) by mouth every 12 hours  Allergies   Allergen Reactions   . Latex      Created by Conversion - 0;    . No Known Drug Allergy      Created by Conversion - 0;        Family History   Problem Relation Age of Onset   . Diabetes Mother    . High cholesterol Mother    . Heart disease Father    . Diabetes Father        History     Social History   . Marital Status: Married     Spouse Name: N/A     Number of Children: N/A   . Years of Education: N/A     Social History Main Topics   . Smoking status: Former Smoker -- 0.80 packs/day for 24 years     Types: Cigarettes     Quit date: 04/04/2011   . Smokeless tobacco: Former Neurosurgeon   . Alcohol Use: No   . Drug Use: No   . Sexually Active: None     Other Topics Concern   . None     Social History Narrative   . None         Review of Systems:    General: No significant weight change,+ fatigue, high stress level  Skin: No new  lesions  Eyes: Glasses  ENT: Oral sore after sublingual nitroglycerin  Cardiovascular: HPI  Pulmonary: HPI  GI: + nausea  GU: No dysuria or polyuria  Heme: Easy bruisability   Endo: No DM, thyroid normal  Musculoskeletal: Chronic back pains  Neuro: No history of stroke or TIA    PHYSICAL EXAM:  BP 118/68  Pulse 76  Ht 1.829 m (6')  Wt 81.647 kg (180 lb)  BMI 24.41 kg/m2  General: alert, full, NAD  HEENT: anicteric, MMM, no E/E OP, conj pink, no arcus senilis   Neck: no JVD, bruits, or LAD  CV: RRR, ns1/s2, no murmurs, rubs, or gallops  Pulm: CTA B  Abd: soft, NT, ND, +BS  Ext: no edema or cyanosis, 2+ distal pulses  Neuro: no gross focal deficits  Skin: ecchymosis on left forearm    Coronary Angiogram (04/04/11):  One vessel coronary artery disease (LAD). Mildly reduced left ventricular function. Successful percutaneous coronary intervention (bare metal stenting) of the left anterior descending artery. Succesful clot extraction form D2.     Coronary Angiogram (07/22/11):  Mild (non-obstructive) coronary artery disease. Normal LV systolic function.     SPECT (04/11/11):  PERFUSION: Myocardial perfusion SPECT imaging demonstrates: (1) Slight vasodilator stress induced ischemia of the apex and apical anterior region involving 4% LV mass superimposed on (2) partial thickness infarction of this region extending to the mid-anterior wall involving 3% LV mass. (3) Consider benefits and risks of aggressive medical therapy, if clinically indicated.   FUNCTION: ECG-gated SPECT myocardial wall motion study shows: (1) Upper normal LV volume and low normal EF are noted with evidence of mild to moderate dysfunction of the apex, peri-apex and mild dysfunction of the anterolateral wall. (2) RV size and function appear normal. (3) No prior study is available for comparison.     VScan (04/25/11): Normal LVEF without evidence of regional dysfunction.  No pericardial thickening or effusion.  Right ventricle appears normal in size and  function.    Assessment & Plan:   [name] is a 46 y.o. with prior anterior STEMI that was seen today for atypical chest pains.      1)  Palpitations:  Symptoms do not appear to correlate with atrial or ventricular arrhythmias despite severe episodes while wearing the Holter monitor.  He does have infrequent PVCs that may be contributing to a small degree.  However, I suspect ongoing anxiety or transient hypotension is the driving force.  He was previously on beta blockade, but did have lightheadedness and given lower blood pressures will defer restarting this.  If symptoms progress, will consider longer-term monitoring after a trial of low dose metoprolol.     2) Coronary artery disease: Some sublingual nitroglycerin usage over the last few weeks, but description remains inconsistent with a cardiac etiology.  Recent repeat angiogram confirmed patent left anterior descending artery stent.  Will continue aggressive medical therapy, but defer repeat imaging unless objective evidence of ischemia occurs.     - Continue aspirin, ticagrelor, lisinopril, amlodipine,and atorvastatin   - Can likely discontinue dual antiplatelet therapy upon next visit   - SL NG as needed     3) Hypertension:  Systolic and diastolic blood pressure are at goal today on current regimen.    - Continue current medications   - Low sodium diet (<4 g/day)   - Electrolytes within normal range (07/30/11)    4) Hyperlipidemia: LDL and non-HDL below goal of <70 and <161 mg/dL on recent blood work (0/96/04).    - Continue current atorvastatin dosing    - Obtain fasting lipid panel at next visit        [name], MD  Cardiovascular Disease    Suggest follow-up as scheduled.  Thank you for allowing [place] to participate in this patient's care.  Please call or e-mail ([email or web address] with any questions or concerns regarding his care.

## 2012-02-25 ENCOUNTER — Other Ambulatory Visit: Payer: Self-pay | Admitting: Cardiology

## 2012-02-25 ENCOUNTER — Other Ambulatory Visit: Payer: Self-pay | Admitting: Gastroenterology

## 2012-02-25 ENCOUNTER — Ambulatory Visit
Admit: 2012-02-25 | Discharge: 2012-02-25 | Disposition: A | Discharge status: Home / Self Care | Payer: Self-pay | Source: Other Acute Inpatient Hospital | Attending: Cardiology | Admitting: Cardiology

## 2012-02-25 LAB — TROPONIN T: Troponin T: <0.01 ng/mL (ref 0.00–0.02)

## 2012-02-25 MED ORDER — HEPARIN SODIUM (PORCINE) 1000 UNIT/ML IJ SOLN *WRAPPED*
Status: AC
Start: 2012-02-25 — End: 2012-02-25
  Filled 2012-02-25: qty 10

## 2012-02-25 MED ORDER — MIDAZOLAM HCL 1 MG/ML IJ SOLN *I* WRAPPED
INTRAMUSCULAR | Status: AC
Start: 2012-02-25 — End: 2012-02-25
  Filled 2012-02-25: qty 5

## 2012-02-25 MED ORDER — FENTANYL CITRATE 50 MCG/ML IJ SOLN *WRAPPED*
INTRAMUSCULAR | Status: AC
Start: 2012-02-25 — End: 2012-02-25
  Filled 2012-02-25: qty 2

## 2012-02-25 MED ORDER — ACETAMINOPHEN 325 MG PO TABS *I*
650.0000 mg | ORAL_TABLET | Freq: Four times a day (QID) | ORAL | Status: DC | PRN
Start: 2012-02-25 — End: 2012-02-26

## 2012-02-25 MED ORDER — SODIUM CHLORIDE 0.9 % IV SOLN WRAPPED *I*
125.0000 mL/h | Status: AC
Start: 2012-02-25 — End: 2012-02-25

## 2012-02-25 MED ORDER — NITROGLYCERIN IN D5W 100 MCG/ML IV SOLN *I*
INTRAVENOUS | Status: AC
Start: 2012-02-25 — End: 2012-02-25
  Filled 2012-02-25: qty 250

## 2012-02-25 NOTE — Discharge Instructions (Signed)
[facility]  Cardiac Catheterization  Electrophysiology Labs  Patient Discharge Instructions      Date: 02/25/2012    Attending Physician: Dr. Dot Lanes                                 Procedure: Left Heart Catheterization/Angiogram      FOLLOW-UP CARE  Call for an appointment with Dr. Audery Amel  In 3 days as follow-up visit appointment post angiogram.    INSTRUCTIONS    Notify Dr.Pedulla promptly if you experience any of the following symptoms: chest pain, shortness of breath, lightheadedness.    If you notice a sudden bright red bleeding or swelling at procedure site, apply firm pressure above the site and call 911.    If signs of infection such as redness, swelling, increased pain or fever, please call one of the following numbers:     Cardiac Catheterization Lab: [phone] between the hours of 8 am and 4:30 pm on Mon - Fri but during weekends, holidays and evening/night hours call:   Cardiac Cath Lab: [phone]   Electrophysiology Study Lab: [phone]    DIET  Resume your previous diet.  Do not drink alcoholic beverages for the next 24 hours.  Drink 8 glasses of water a day for 3 days to flush out the dye out of your body.    CARE OF DRESSING OR INCISION  It is normal to have:      1.  A lump at the puncture site.  The lump can range from the size of a pea to the size of a walnut.  This lump will slowly go away over the next month.      2.  Bruising around the site.  The bruise will go though many color changes.  It may take several weeks to completely go away.      3.  Soreness, which will improve within a few days       Keep site clean and dry.    A shower is permitted 24 hours after your procedure  Remove the dressing/band-aid before showering and allow the water to run over the puncture site.  Clean the site daily, for the next 5 days, with soap and water.  Gently pat dry.    Place a clean band-aid over the site after washing until puncture site is healed.   Radial  Do not immerse your  arm/hand in water for one week  No blood pressure measurements or blood draws in the right arm for 4 days    ACTIVITY  No lifting, pushing or pulling more than 5 lbs. (e.g. shoveling, mowing, raking, vacuuming, shoveling) for 2 days.  Minimal stair climbing  Do not drive for 2 days  May resume sexual intercourse in 2 days  You may return to work in __2__days    PAIN MANAGEMENT/OTHER  Make no major decisions for the next 24 hours.  You have received medication that may make you sleepy.  Do not drive, drink alcohol, or operate machinery for 2 days.  For mild pain you can take Acetaminophen/Tylenol.    PRESCRIPTIONS  Medication: no new medications.  Resume home meds.                                   Additional Written Instructions Provided to Patient: No  If yes, specify: n/a  Provider Signature: Algis Greenhouse, NP  Date/Time: 02/25/2012 1:56 PM    Instructions reviewed with patient by: ______________________________________        Signature/Title   Date    I HAVE RECEIVED AND UNDERSTAND INSTRUCTIONS PROVIDED  ______________________________________________________________________    Signature of Patient or Significant Other (Enter Relationship to Patient if not Patient)

## 2012-02-25 NOTE — Provider Consult (Signed)
Subjective:     [name] is a 46 y/o man with medical history significant for CAD with anterior STEMI s/p BMS x1 to the mid-LAD (04/04/2011), chronic atypical angina leading to several hospital visits with subsequent negative cardiac work-up, ongoing tobacco abuse, and anxiety.  He initially presented to [facility] with acute onset of atypical angina that started at 2AM.  Patient states he was at work and sitting at his desk when he acutely felt 8/10 right sided chest pain.  Patient states he took NTG SL x2 with moderate relief of symptoms.  Patient states that at 6AM, the chest pain then moved centrally and then to the left side of his chest with radiation down his left arm and to the back of his neck.  At that time, patient took an additional NTG SL x2 without relief of his symptoms.  Patient then called his primary cardiologist, Dr. [name] at about 8AM, who then instructed him to go to the nearest hospital.  Upon arrival to [facility], ECG did not reveal acute ischemic changes and troponin was negative.  Patient received an additional ASA 81 mg po x2, NTG SL x2, and a nitro patch.  When symptoms did not completely resolve, patient was transferred to [facility] for coronary angiogram for concern of unstable angina.      Of note, patient takes ticagrelor 90 mg po bid and ASA 81 mg po qday.    PTCA: yes    CABG: no  Exercise Treadmill / Stress Test: not performed  Echocardiogram: not performed  Ischemia: not performed    Additional Risk Factors    Past Medical History   Diagnosis Date   . Prostatitis    . Anxiety    . Benign prostatic hypertrophy    . Depression    . Discogenic syndrome    . Nicotine dependence    . Myocardial infarction 04/04/11     stemi   . Coronary artery disease    . Cardiac arrest    . Chest pain, unspecified    . Palpitations        History   Smoking status   . Former Smoker -- 0.80 packs/day for 24 years   . Types: Cigarettes   . Quit date: 04/04/2011    Smokeless tobacco   . Former Neurosurgeon       Allergies:   Allergies   Allergen Reactions   . Latex      Hands peel   . No Known Drug Allergy      Created by Conversion - 0;        Prior to Admission Medications:    (Not in a hospital admission)    Active Hospital Medications:  Current Outpatient Prescriptions   Medication   . sertraline (ZOLOFT) 100 MG tablet   . lisinopril (PRINIVIL,ZESTRIL) 5 MG tablet   . atorvastatin (LIPITOR) 40 MG tablet   . isosorbide mononitrate (IMDUR) 30 MG 24 hr tablet   . amLODIPine (NORVASC) 5 MG tablet   . prochlorperazine (COMPAZINE) 10 MG tablet   . omeprazole (PRILOSEC) 20 MG capsule   . cyclobenzaprine (FLEXERIL) 10 MG tablet   . nitroglycerin (NITROSTAT) 0.4 MG SL tablet   . aspirin 81 MG EC tablet   . Ticagrelor (BRILINTA) 90 MG TABS tablet     No current facility-administered medications for this encounter.          Objective:     Physical Exam  Vitals:  Blood pressure  130/79, pulse 71, temperature 36.5 C (97.7 F), temperature source Foley, resp. rate 17, SpO2 99.00%.    Vitals in last 24 hrs:  Patient Vitals for the past 24 hrs:   BP Temp Temp src Pulse Resp SpO2   02/25/12 1239 130/79 mmHg - - - - -   02/25/12 1236 125/77 mmHg 36.5 C (97.7 F) FOLEY 71  17  99 %     O2 Device: None (Room air) (02/25/12 1236)      BMI: There is no weight on file to calculate BMI.    Gen: Mild distress  HEENT :anicteric sclera  CV: RRR +S1/S2; no m/g/r; JVP 7 cm  Lungs: CTAB without w/r/r  GI: soft, nontender, ND, +BS  Ext: No peripheral edema   Neuro: alert and oriented x3    Airway Visibility: soft palate and uvula    Lab Review     Lab Results   Component Value Date    NA 140 01/26/2012    K 4.6 01/26/2012    CL 103 01/26/2012    CO2 29* 01/26/2012    UN 12 01/26/2012    CREAT 0.79 01/26/2012    CA 9.0 01/26/2012    GLU 110* 01/26/2012    WBC 5.9 07/30/2011    HCT 40 07/30/2011    HGB 13.0* 07/30/2011    MCV 93* 07/30/2011    PLT 259 07/30/2011    GFRB 124 01/26/2012    GFRC 107 01/26/2012     Protime    Date Value Range Status   07/21/2011 12.0  9.2 - 12.3 sec Final        INR   Date Value Range Status   07/21/2011 1.2  1.0 - 1.2 Final                                               Therapeutic   2.0-3.0      The INR should be used to monitor patients on long term Warfarin.      Selected patients may require higher levels of anticoagulation.         CKD stage: (1=slight, GFR>90 / 2=mild / 3=moderate / 4=severe / 5=end-stage, GFR<15) Stage 1    Anesthesiologist's Physical Status rating of the patient: Class III: Severe Systemic Disease    Plan for sedation: Moderate  I am evaluating the patient immediately prior to admission of sedation medication. The plan for sedation remains appropriate.      Assessment:   Mr. Ortolani is a 46 y/o man with medical history significant for CAD with anterior STEMI s/p BMS x1 to the mid-LAD (04/04/2011), chronic atypical angina leading to several hospital visits with subsequent negative cardiac work-up, ongoing tobacco abuse, and anxiety.  He presents with typical and atypical anginal symptoms with concern for unstable angina. Indications for procedure: acute coronary syndrome, angina at rest, chest pain, shortness of breath     Plan:   Cardiac cath to rule out ischemic CAD.  Possible angioplasty.  Renal: Hydration with IVF NS    Author: Tharon Aquas, MD  as of: 02/25/2012  at: 12:49 PM

## 2012-02-26 LAB — EKG 12-LEAD
P: 24 degrees
QRS: 0 degrees
Rate: 62 {beats}/min
Severity: ABNORMAL
Severity: ABNORMAL
Statement: ABNORMAL
T: 66 degrees

## 2012-03-03 ENCOUNTER — Ambulatory Visit
Admit: 2012-03-03 | Disposition: A | Discharge status: Home / Self Care | Payer: Self-pay | Source: Ambulatory Visit | Attending: Cardiology | Admitting: Cardiology

## 2012-03-05 ENCOUNTER — Inpatient Hospital Stay: Payer: Self-pay | Admitting: Cardiology

## 2012-03-16 ENCOUNTER — Ambulatory Visit: Payer: Self-pay | Admitting: Cardiology

## 2012-03-17 ENCOUNTER — Other Ambulatory Visit: Payer: Self-pay | Admitting: Primary Care

## 2012-04-19 ENCOUNTER — Ambulatory Visit: Payer: Self-pay

## 2012-04-26 ENCOUNTER — Encounter: Payer: Self-pay | Admitting: Primary Care

## 2012-04-26 ENCOUNTER — Ambulatory Visit: Payer: Self-pay | Admitting: Primary Care

## 2012-04-26 VITALS — BP 120/68 | HR 68 | Resp 14 | Ht 72.0 in | Wt 187.0 lb

## 2012-04-26 DIAGNOSIS — Z23 Encounter for immunization: Secondary | ICD-10-CM

## 2012-04-26 DIAGNOSIS — E7800 Pure hypercholesterolemia, unspecified: Secondary | ICD-10-CM

## 2012-04-26 DIAGNOSIS — I10 Essential (primary) hypertension: Secondary | ICD-10-CM

## 2012-04-26 DIAGNOSIS — I2510 Atherosclerotic heart disease of native coronary artery without angina pectoris: Secondary | ICD-10-CM

## 2012-04-26 NOTE — Progress Notes (Signed)
S:  [name] returns to the office for follow up of his hypertension, hypercholesterolemia and CAD.    He continues with his usual medications.  He denies any problems with headaches, dizziness, vision changes, palpitations, shortness of breath, heartburn, nausea, diarrhea, constipation, bleeding problems or fatigue.  He is not in the habit of checking his blood pressures at home.    He continues to experience occasional episodes of central chest pain, consistent with stable angina.  These episodes seem to be gradually reducing in frequency and severity.    He did not complete his blood work prior to today's appointment.    O:  General: Alert, appropriate, pleasant man in no apparent distress.  Neck: Supple, no lymphadenopathy, no thyromegaly, 2+ carotid pulses bilaterally.  Heart: Regular rate and rhythm, no murmur.  Lungs: Clear to auscultation bilaterally.  Abdomen: Positive bowel sounds, soft, nontender, nondistended, no masses, no organomegaly.  Extremities: 2+ radial and 2+ posterior tibial pulses bilaterally.  No clubbing, cyanosis or edema.    A:  1.  Essential hypertension -- well controlled  2.  Hypercholesterolemia -- well controlled on last blood test  3.  CAD -- occasional stable angina    P:  1.  Continue with current medications at their current doses.  2.  Continue to maintain efforts on prudent diet, exercise and weight management.  3.  Complete the blood work that was previously requested in the near future.  4.  I advised the patient that I will call him if there are any concerns in regards to his blood test results.  If he does not hear from me then he can assume that the results are good.  5.  Patient was given a seasonal influenza immunization in the office today.  6.  RTO in 6 months for follow up of the above problems or sooner if any other problems or concerns.  7.  Complete blood work prior to the next appointment to check an FLP and CMP.

## 2012-05-09 ENCOUNTER — Other Ambulatory Visit: Payer: Self-pay | Admitting: Cardiology

## 2012-05-09 MED ORDER — TICAGRELOR 90 MG PO TABS *I*
90.0000 mg | ORAL_TABLET | Freq: Two times a day (BID) | ORAL | Status: DC
Start: 2012-05-09 — End: 2012-07-09

## 2012-05-24 ENCOUNTER — Other Ambulatory Visit: Payer: Self-pay | Admitting: Gastroenterology

## 2012-05-24 ENCOUNTER — Observation Stay
Admission: EM | Admit: 2012-05-24 | Discharge status: Against Medical Advice | Payer: Self-pay | Source: Ambulatory Visit | Attending: Emergency Medicine | Admitting: Emergency Medicine

## 2012-05-24 ENCOUNTER — Telehealth: Payer: Self-pay | Admitting: Cardiology

## 2012-05-24 ENCOUNTER — Telehealth: Payer: Self-pay

## 2012-05-24 ENCOUNTER — Encounter: Payer: Self-pay | Admitting: Emergency Medicine

## 2012-05-24 LAB — BASIC METABOLIC PANEL
Anion Gap: 10 (ref 7–16)
CO2: 28 mmol/L (ref 20–28)
Calcium: 8.9 mg/dL (ref 8.6–10.2)
Chloride: 101 mmol/L (ref 96–108)
Creatinine: 0.79 mg/dL (ref 0.67–1.17)
GFR,Black: 123 *
GFR,Caucasian: 107 *
Glucose: 87 mg/dL (ref 60–99)
Lab: 12 mg/dL (ref 6–20)
Potassium: 4.2 mmol/L (ref 3.3–5.1)
Sodium: 139 mmol/L (ref 133–145)

## 2012-05-24 LAB — CK ISOENZYMES
CK: 203 U/L — ABNORMAL HIGH (ref 46–171)
Mass CKMB: 2.9 ng/mL (ref 0.0–4.9)
Relative Index: 1.4 % (ref 0.0–5.0)

## 2012-05-24 LAB — TROPONIN T: Troponin T: <0.01 ng/mL (ref 0.00–0.02)

## 2012-05-24 MED ORDER — ACETAMINOPHEN 325 MG PO TABS *I*
650.0000 mg | ORAL_TABLET | ORAL | Status: DC | PRN
Start: 2012-05-24 — End: 2012-05-25

## 2012-05-24 MED ORDER — NITROGLYCERIN 0.4 MG SL SUBL *I*
0.4000 mg | SUBLINGUAL_TABLET | SUBLINGUAL | Status: DC | PRN
Start: 2012-05-24 — End: 2012-05-25
  Administered 2012-05-24: 0.4 mg via SUBLINGUAL
  Filled 2012-05-24: qty 1

## 2012-05-24 MED ORDER — LORAZEPAM 0.5 MG PO TABS *I*
0.5000 mg | ORAL_TABLET | Freq: Three times a day (TID) | ORAL | Status: DC | PRN
Start: 2012-05-24 — End: 2012-05-25

## 2012-05-24 MED ORDER — PANTOPRAZOLE SODIUM 40 MG PO TBEC *I*
40.0000 mg | DELAYED_RELEASE_TABLET | Freq: Two times a day (BID) | ORAL | Status: DC
Start: 2012-05-25 — End: 2012-05-25

## 2012-05-24 NOTE — ED Notes (Addendum)
Pt arrives to ED with c/o chest pain radiating to left side and left shoulder with noted numbness in RLE. Pt had hx of MI last year with stent placed. Took 4 nitro SL and 4 baby aspirin prior to arrival in ED. Pt also states he has been falling at home lately, stumbling over his own feet and becoming increasing more unsteady. States he had MRI to r/o ms but never followed up with his PCP. Wife with at bedside.

## 2012-05-24 NOTE — Consults (Addendum)
Cardiology Consult Note    Consult question: Chest pain  Requesting Service: ED    HPI:  [name] is a 46 year old man with h/o STEMI in 03/2011 s/p BMS-->LAD who presents with c/o chest pain. He was in his usual state of health till around 9AM when while changing a spare tire at work, he developed chest pain. Chest pain is left sided, cramping and pressure-like, about 8-9/10, with radiation to the left arm and shoulder blade, no known aggravating factors. Pain was not aggravated with deep breathing, movement of his body or position change. He could not really ascertain if pain is aggravated with physical exertion. He took first NTG after about one hour of having the pain. The NTG helped a bit but the pain did not resolve completely. He took a total of NTG before the pain finally resolved. The pain lasted about 3 hours in total. There was associated tingling in his left hand and SOB when he had the pain. He was also lightheaded but he was not diaphoretic. He called his cardiology office who instructed him to take four baby Aspirin and come to the ED. Since arrival in the ED, he has had another episode of chest pain which was relieved with NTG.  It is of note that patient had patient had left heart cath in 07/2011 which showed mild non-obstructive CAD with patent stent. He had another left heart cath in 02/2012 because of frequent chest pain at least once a month and that did not show any significant difference from the prior cath. He has no family of premature CAD. He smoked about 1PP for about 20 years. Quit in 03/2011. He does not use recreational drugs. He drinks alcohol occasionally.    Past Medical History   Diagnosis Date   . Prostatitis    . Anxiety    . Benign prostatic hypertrophy    . Depression    . Discogenic syndrome    . Nicotine dependence    . Myocardial infarction 04/04/11     stemi   . Coronary artery disease    . Cardiac arrest    . Chest pain, unspecified    . Palpitations      Past  Surgical History   Procedure Laterality Date   . Ankle surgery     . Coronary angioplasty with stent placement  04/04/11     BMS to LAD   . Coronary angioplasty  04/04/11     with BMS to LAD   . Ankle surgery     . Cardiac catheterization  07/2011     mild nonobstructive disease-stent patent     Family History   Problem Relation Age of Onset   . Diabetes Mother    . High cholesterol Mother    . Heart disease Father    . Diabetes Father      History     Social History   . Marital Status: Married     Spouse Name: N/A     Number of Children: N/A   . Years of Education: N/A     Social History Main Topics   . Smoking status: Former Smoker -- 0.80 packs/day for 24 years     Types: Cigarettes     Quit date: 04/04/2011   . Smokeless tobacco: Former Neurosurgeon   . Alcohol Use: No   . Drug Use: No   . Sexually Active: Not on file     Other Topics Concern   .  Not on file     Social History Narrative   . No narrative on file       Allergies:   Allergies   Allergen Reactions   . Latex      Hands peel   . No Known Drug Allergy      Created by Conversion - 0;        Admission Medications:    (Not in a hospital admission)  BP: (122)/(64)   Temp:  [37.2 C (99 F)]   Temp src:  [-]   Heart Rate:  [79]   Resp:  [16]   SpO2:  [98 %]   Height:  [182.9 cm (6')]   Weight:  [83.915 kg (185 lb)]      Physical Examination:  Gen: NAD, A&Ox3, follows commands  HEENT: anicteric, MMM, EOMI  Pulm: CTA(B), Neg W/R/R  No chest wall tenderness  CV: S1S2 RRR no M/R/G. No JVD   Abd: soft, NT, ND, +BS  Ext: no edema, W+P, full ROM  Neuro: non-focal    Labs:  No results found for this basename: WBC, HGB, HCT, PLT, NEUTOPHILPCT, MONOPCT,  EOSPCT,  in the last 168 hours    Recent Labs  Lab 05/24/12  1446   Sodium 139   Potassium 4.2   Chloride 101   CO2 28     No results found for this basename: APTT, INR, PTT,  in the last 168 hours  No components found with this basename: CKTOTAL, TROPONINI, TROPONINT, CKMBINDEX,   Chol/HDL Ratio   Date Value Range Status    01/26/2012 2.9   Final        Cholesterol   Date Value Range Status   01/26/2012 118   Final      REFERENCE RANGE:  < 200 Desirable                      200-239 Borderline High                        > 240 High        HDL   Date Value Range Status   01/26/2012 41   Final      REFERENCE RANGE:  < 40 Low                        > 60 High        LDL Calculated   Date Value Range Status   01/26/2012 52   Final      REFERENCE RANGE:  < 100 Optimal                      100-129 Near or above optimal                      130-159 Borderline High                      160-189 High                        > 189 Very High        Triglycerides   Date Value Range Status   01/26/2012 124   Final      REFERENCE RANGE:  < 150 Normal  150-199 Borderline High                      200-499 High                        > 500 Very High       ECG: sinus rhythm, rate 68, normal axis, non-specific IVCD      Cardiac Studies:  Left heart cath (02/25/12): CONCLUSIONS: Mild to moderate coronary artery disease with a widely   patent stent. Anatomy is essetially identical to the prior angiogram.  Overall preserved left ventricular systolic function.     Radiology:  No results found.    Assessment:    46 year old man with h/o STEMI in 03/2011 s/p BMS-->LAD who presents with c/o chest pain. The characteristics of his chest pain is concerning for acute coronary syndrome. First troponin is negative. Second troponin was drawn too early (negative). EKG did not show any ischemic changes. It is of note that he recently had left heart cath done on 02/25/12 which only showed mild to moderate CAD with a widely patent stent and the anatomy was noted to be identical to the prior angiogram (07/2011). PE is less likely as he is low probability for PE. Aortic dissection is also unlikely. The characteristics of his pain does not fit pericarditis or pleurisy. He does not have any other symptoms suggestive of pneumonia. Presentation and exam is not  suggestive of pneumothorax. Characteristics of his pain is also not suggestive of esophageal spasm or GERD. Panic attack is another possibility.    Recommendations:  -serial troponin to rule out MI  -NTG SL 0.4mg  PRN for chest pain  -start home medications (Ticagrelor, Lisinopril, Imdur, Amlodipine, Atorvastatin, Aspirin (from tomorrow)  -if he rules out for MI, he will need a nuclear stress test tomorrow. So if he rules out for MI, make NPO from midnight    Author: Jori Moll, MD  Note created: 05/24/2012  at: 5:29 PM  Internal Medicine Resident    Cardiology Consult Attending:      I talked to and examined this patient.  I agree with the findings and plan as documented above.  46 WM with CAD and bare metal stent and two recent cardiac cath showing mild to moderate CAD.   He complains of chest pain for two hours this morning  with perioral numbness and hand numbness.  His ECG and first troponin are normal.   He probably has panic attacks.  I recommend admission and stress test in the morning.  He wants to leave against medical advice.  I told him he has a small risk of sudden death or heart attack if he leaves.  His wife and he understand the risks and wish to leave against my advice.    Beatrix Shipper. Faustino Congress  Division of Cardiology  Department of Medicine

## 2012-05-24 NOTE — Telephone Encounter (Signed)
Patient is 46 y/o man with medical history significant for CAD with anterior STEMI s/p BMS x1 to the mid-LAD (04/04/2011), chronic atypical angina leading to several hospital visits with subsequent negative cardiac work-up, ongoing tobacco abuse, and anxiety.      He underwent a repeat coronary angiogram on 02/25/2012, for atypical chest pain that revealed mild to moderate CAD with a widely patent stent; findings were identical to prior angiogram.      He calls this morning with similar recurrent atypical chest pain unresponsive to NTG SL x4.  I have instructed the patient to come into the ED for further evaluation.  However, based on prior presentations,  I suspect that symptoms are more from anxiety.      Patient voiced understanding and will come into the ED for further evaluation.    Tharon Aquas, MD  12:06 PM  05/24/2012

## 2012-05-24 NOTE — ED Notes (Signed)
C/o left-sided chest pain with radiation to neck and shoulder blade.  Took 4 baby aspirin and 4 nitro at home with some relief.  H/o MI with stent.  EKG done at Triage.

## 2012-05-24 NOTE — ED Provider Notes (Addendum)
History     Chief Complaint   Patient presents with   . Chest Pain     acute L sided chest pain R/O ACS     HPI Comments: [name] is a 46 y.o. Male with PMH sig for History of MI (BMS to LAD in 2012) who presents to [facility] ED with substernal chest pain radiating to L chest 5/10 severity that lasted minutes in duration and was greatly relieved with use of sublingual nitroglycerin.  Upon chest pain patient called his Cardiologsit Geronimo Running who instructed him to take 4 ASA 81 mg tabs.  Patient with minimal 2/10 chest pain on presentation to [facility] ED, denies shortness of breath, chest wall trauma, loss of conciousness/altered mental status.      History provided by:  Patient and spouse  Language interpreter used: No        Past Medical History   Diagnosis Date   . Prostatitis    . Anxiety    . Benign prostatic hypertrophy    . Depression    . Discogenic syndrome    . Nicotine dependence    . Myocardial infarction 04/04/11     stemi   . Coronary artery disease    . Cardiac arrest    . Chest pain, unspecified    . Palpitations             Past Surgical History   Procedure Laterality Date   . Ankle surgery     . Coronary angioplasty with stent placement  04/04/11     BMS to LAD   . Coronary angioplasty  04/04/11     with BMS to LAD   . Ankle surgery     . Cardiac catheterization  07/2011     mild nonobstructive disease-stent patent       Family History   Problem Relation Age of Onset   . Diabetes Mother    . High cholesterol Mother    . Heart disease Father    . Diabetes Father          Social History      reports that he quit smoking about 13 months ago. His smoking use included Cigarettes. He has a 19.2 pack-year smoking history. He has quit using smokeless tobacco. He reports that he does not drink alcohol or use illicit drugs. His sexual activity history not on file.    Living Situation    Questions Responses    Patient lives with Spouse    Homeless No    Caregiver for other family member     External Services  None    Employment Disabled    Domestic Violence Risk No          Review of Systems   Review of Systems   Constitutional: Negative for fever, diaphoresis, activity change, appetite change and fatigue.   HENT: Negative for congestion, sore throat, neck pain and voice change.    Eyes: Negative for pain and visual disturbance.   Respiratory: Negative for cough, chest tightness, shortness of breath and wheezing.    Cardiovascular: Positive for chest pain. Negative for palpitations and leg swelling.   Gastrointestinal: Negative for nausea, vomiting and abdominal pain.   Endocrine: Negative for polyuria.   Genitourinary: Negative for difficulty urinating.   Musculoskeletal: Negative for myalgias.   Skin: Negative for pallor and rash.   Neurological: Negative for seizures, light-headedness and headaches.   Psychiatric/Behavioral: Negative for confusion and decreased concentration.  Physical Exam     ED Triage Vitals   BP Heart Rate Heart Rate(via Pulse Ox) Resp Temp Temp Source SpO2 O2 Device O2 Flow Rate   05/24/12 1329 05/24/12 1329 -- 05/24/12 1329 05/24/12 1329 05/24/12 1329 05/24/12 1329 05/24/12 1329 --   122/64 mmHg 79  16 37.2 C (99 F) TEMPORAL 98 % None (Room air)       Weight           05/24/12 1329           83.915 kg (185 lb)               Physical Exam   Constitutional: He is oriented to person, place, and time. He appears well-developed and well-nourished.   HENT:   Head: Normocephalic and atraumatic.   Mouth/Throat: Uvula is midline, oropharynx is clear and moist and mucous membranes are normal.   Eyes: EOM are normal. Pupils are equal, round, and reactive to light.   Neck: Normal range of motion. No JVD present.       Trach site c/d/i   Cardiovascular: Normal rate, regular rhythm and normal heart sounds.  Exam reveals no gallop and no friction rub.    No murmur heard.  Pulmonary/Chest: Effort normal and breath sounds normal. He has no wheezes. He has no rales.   Abdominal: He exhibits no distension.  There is no tenderness.   Musculoskeletal: Normal range of motion.        Right upper leg: Normal.        Left upper leg: Normal.        Right lower leg: Normal. He exhibits no laceration.        Left lower leg: Normal. He exhibits no laceration.   Neurological: He is alert and oriented to person, place, and time.   Skin: No rash noted. He is not diaphoretic.   Psychiatric: He has a normal mood and affect. His behavior is normal.       Medical Decision Making   <EDMDM>    Initial Evaluation:  ED First Provider Contact    Date/Time Event User Comments    05/24/12 1427 ED Provider First Contact STROUP, SCOTT Initial Face to Face Provider Contact          Patient seen by me on arrival date of 05/24/2012 at at time of arrival  1:27 PM.  Initial face to face evaluation time noted above may be discrepant due to patient acuity and delay in documentation.    Assessment:  46 y.o., male with PMH sig for history of MI (2012 with BMS to LAD) with acute onset substernal chest pain.    Differential Diagnosis includes ACS vs. GERD vs. Musculoskeletal pain vs. GERD vs. cardiac anxiety    Plan:   1. Diagnostics:   Telemetry  Troponin t  CK isoenzymes  EKG 12 lead    2. Therapeutic:   -of note patient received 324 of ASA and nitroglycerin 1.6 SL prior to arrival  Nitroglycerin 0.4 SL Q43min for chest pain  Cardiology consult  Patient NPO effective now    3. Disposition:   To observation for 24 hour ACS rule out pending cardiology consult and workup       Luvenia Redden, MD    Luvenia Redden, MD  Resident  05/24/12 [phone]    Emergency Medicine Attending    Resident Attestation:     Patient seen by me on arrival date of 05/24/2012 at 1450  History:   I reviewed this patient, reviewed the resident's note and agree.  Exam:   I examined this patient, reviewed the resident's note and agree.  Decision Making:   I discussed with the resident his/her documented decision making  and agree, with corrections as below.    The patient has symptoms  concerning for possible ACS, chest pain less severe, but otherwise similar to symptoms with his heart attack a year ago. The patient will require admission for serial troponin tests and cardiac monitoring, with Cardiology recommendations regarding further cardiac risk stratification.    ECG was personally reviewed by me.  Lab test results were personally reviewed by me.    Imaging was personally reviewed by me.  We discussed the patient with another provider: Cardiology.    Author Tobias Alexander, MD    Tobias Alexander, MD  05/26/12 [phone]

## 2012-05-24 NOTE — ED Notes (Signed)
Pt given AMA form by covering provider Parke Poisson, MD. IV saline lock removed by this RN. Encouraged to follow up and come for stress test in the am. D/C to home with wife at this time.

## 2012-05-24 NOTE — Telephone Encounter (Signed)
Pt chest pain,dizzy, mouth numb,shaking.

## 2012-05-24 NOTE — ED Notes (Signed)
Bed:AC-01L<BR> Expected date:<BR> Expected time:<BR> Means of arrival:<BR> Comments:<BR>

## 2012-05-24 NOTE — Discharge Instructions (Signed)
You were seen today for chest pain. Though the risk of heart attack tonight is low, we recommended you stay overnight to receive repeat tests and to receive a stress test in the morning.     At home, get lots of rest, drink plenty of fluids, and eat a healthy diet. No caffeine. Don't eat anything after midnight tonight. You will receive a call at home from the Cardiology Team at approximately 7am tomorrow morning to tell you what time you are scheduled for a Stress Test.    Call your primary care doctor or return to the Emergency Department if your chest pain returns, you feel light-headed, sweaty, short of breath, nauseated.     If any of your symptoms worsen or you develop additional symptoms that are concerning to you, follow up with your primary care doctor or return to the Emergency Department.

## 2012-05-24 NOTE — Telephone Encounter (Signed)
Spoke to [name], Dr. Tristan Schroeder instructed him to go to [facility]. His wife is going to take him now.

## 2012-05-25 ENCOUNTER — Telehealth: Payer: Self-pay | Admitting: Cardiology

## 2012-05-25 ENCOUNTER — Other Ambulatory Visit: Payer: Self-pay | Admitting: Gastroenterology

## 2012-05-25 ENCOUNTER — Ambulatory Visit: Payer: Self-pay

## 2012-05-25 DIAGNOSIS — R079 Chest pain, unspecified: Secondary | ICD-10-CM

## 2012-05-25 NOTE — Telephone Encounter (Signed)
Will call patient with nuc appt time since he left AMA last night.

## 2012-05-26 ENCOUNTER — Ambulatory Visit
Admit: 2012-05-26 | Discharge: 2012-05-26 | Disposition: A | Discharge status: Home / Self Care | Payer: Self-pay | Source: Ambulatory Visit | Attending: Primary Care | Admitting: Primary Care

## 2012-05-26 DIAGNOSIS — E7800 Pure hypercholesterolemia, unspecified: Secondary | ICD-10-CM

## 2012-05-26 DIAGNOSIS — I10 Essential (primary) hypertension: Secondary | ICD-10-CM

## 2012-05-26 LAB — COMPREHENSIVE METABOLIC PANEL
ALT: 34 U/L (ref 0–50)
AST: 27 U/L (ref 0–50)
Albumin: 4.7 g/dL (ref 3.5–5.2)
Alk Phos: 75 U/L (ref 40–130)
Anion Gap: 9 (ref 7–16)
Bilirubin,Total: 0.3 mg/dL (ref 0.0–1.2)
CO2: 26 mmol/L (ref 20–28)
Calcium: 8.7 mg/dL (ref 8.6–10.2)
Chloride: 103 mmol/L (ref 96–108)
Creatinine: 0.73 mg/dL (ref 0.67–1.17)
GFR,Black: 127 *
GFR,Caucasian: 110 *
Glucose: 114 mg/dL — ABNORMAL HIGH (ref 60–99)
Lab: 17 mg/dL (ref 6–20)
Potassium: 4.1 mmol/L (ref 3.3–5.1)
Sodium: 138 mmol/L (ref 133–145)
Total Protein: 7.1 g/dL (ref 6.3–7.7)

## 2012-05-26 LAB — LIPID PANEL
Chol/HDL Ratio: 3.1
Cholesterol: 129 mg/dL
HDL: 42 mg/dL
LDL Calculated: 63 mg/dL
Non HDL Cholesterol: 87 mg/dL
Triglycerides: 119 mg/dL

## 2012-06-14 ENCOUNTER — Ambulatory Visit: Payer: Self-pay | Admitting: Cardiology

## 2012-06-17 LAB — EKG 12-LEAD
P: 61 degrees
QRS: 14 degrees
Rate: 68 {beats}/min
Severity: ABNORMAL
Severity: ABNORMAL
T: 56 degrees

## 2012-06-21 ENCOUNTER — Ambulatory Visit: Payer: Self-pay | Admitting: Cardiology

## 2012-06-21 ENCOUNTER — Encounter: Payer: Self-pay | Admitting: Cardiology

## 2012-06-21 ENCOUNTER — Ambulatory Visit
Admit: 2012-06-21 | Discharge: 2012-06-21 | Disposition: A | Discharge status: Home / Self Care | Payer: Self-pay | Source: Ambulatory Visit | Attending: Cardiology | Admitting: Cardiology

## 2012-06-21 VITALS — BP 130/78 | HR 80 | Ht 72.0 in | Wt 186.0 lb

## 2012-06-21 DIAGNOSIS — R002 Palpitations: Secondary | ICD-10-CM

## 2012-06-21 DIAGNOSIS — R079 Chest pain, unspecified: Secondary | ICD-10-CM

## 2012-06-21 DIAGNOSIS — I2510 Atherosclerotic heart disease of native coronary artery without angina pectoris: Secondary | ICD-10-CM

## 2012-06-21 DIAGNOSIS — E785 Hyperlipidemia, unspecified: Secondary | ICD-10-CM

## 2012-06-21 MED ORDER — NITROGLYCERIN 0.4 MG SL SUBL *I*
0.4000 mg | SUBLINGUAL_TABLET | SUBLINGUAL | Status: DC | PRN
Start: 2012-06-21 — End: 2013-08-01

## 2012-06-21 NOTE — Progress Notes (Signed)
Reason for visit: Palpitations    History of Present Illness:  We had the pleasure of seeing [name] today in our [facility].  He is a 47 y.o. with tobacco abuse and anxiety that sustained an anterior STEMI on 04/04/11.  He received thrombectomy and successful revascularization with a bare-metal stent to his mid left anterior descending artery.  Over the next year, [name] had multiple emergency department visits for atypical symptoms.  This prompted nuclear stress testing twice and a repeat angiogram that was unchanged from previous study.  He has been managed somewhat successfully with calcium channel blockers and nitrates.  [name] was again hospitalized last month with atypical chest pain prompting SPECT.  This was improved from prior images and he was discharged to follow-up today.  Symptoms have persisted and are occurring almost daily.  They are centrally located and last for a few minutes in duration.  They resolve with rest and occasional SL NG usage.  He does not have an exertional trigger.  He also continues to have frequent and skipped beats associated with lightheadedness.  There is no correlation with activity, time of day, or position change, but they do occur more frequently at work when working with dust and sand.  Prior Holter testing captured severe symptoms that corresponded with sinus rhythm and very rare PVCs.        Past Medical History   Diagnosis Date   . Prostatitis    . Anxiety    . Benign prostatic hypertrophy    . Depression    . Discogenic syndrome    . Nicotine dependence    . Myocardial infarction 04/04/11     stemi   . Coronary artery disease    . Cardiac arrest    . Chest pain, unspecified    . Palpitations    . STEMI (ST elevation myocardial infarction) 04/04/2011   . HTN (hypertension) 04/10/2011     Past Surgical History   Procedure Laterality Date   . Ankle surgery     . Coronary angioplasty  04/04/11     with BMS  to LAD   . Ankle surgery     . Cardiac catheterization  07/2011     mild nonobstructive disease-stent patent   . Coronary angioplasty with stent placement  04/04/11     BMS to LAD         Current Outpatient Prescriptions   Medication Sig   . ticagrelor (BRILINTA) 90 MG tablet Take 1 tablet (90 mg total) by mouth every 12 hours   . omeprazole (PRILOSEC) 20 MG capsule take 1 capsule by mouth twice a day   . sertraline (ZOLOFT) 100 MG tablet take 2 tablets by mouth once daily   . lisinopril (PRINIVIL,ZESTRIL) 5 MG tablet Take 1 tablet (5 mg total) by mouth daily   . atorvastatin (LIPITOR) 40 MG tablet Take 1 tablet (40 mg total) by mouth daily (with dinner)   . isosorbide mononitrate (IMDUR) 30 MG 24 hr tablet Take 1 tablet (30 mg total) by mouth daily   Swallow whole. Do not crush, break, or chew.   [name] amLODIPine (NORVASC) 5 MG tablet Take 1 tablet (5 mg total) by mouth daily   . cyclobenzaprine (FLEXERIL) 10 MG tablet take 1 tablet by mouth three times a day if needed   . nitroglycerin (NITROSTAT) 0.4 MG SL tablet Place 0.4 mg under the tongue every 5 minutes as needed   May repeat 2 times then call 911 if  pain persists.    [name] aspirin 81 MG EC tablet Take 1 tablet (81 mg total) by mouth daily         Allergies   Allergen Reactions   . Latex      Hands peel   . No Known Drug Allergy      Created by Conversion - 0;        Family History   Problem Relation Age of Onset   . Diabetes Mother    . High cholesterol Mother    . Heart disease Father    . Diabetes Father        History     Social History   . Marital Status: Married     Spouse Name: N/A     Number of Children: N/A   . Years of Education: N/A     Social History Main Topics   . Smoking status: Former Smoker -- 0.80 packs/day for 24 years     Types: Cigarettes     Quit date: 04/04/2011   . Smokeless tobacco: Former Neurosurgeon   . Alcohol Use: No   . Drug Use: No   . Sexually Active: None     Other Topics Concern   . None     Social History Narrative   . None         Review  of Systems:    General: No significant weight change,+ fatigue, high stress level  Skin: No new lesions  Eyes: Glasses  ENT: Oral sore after sublingual nitroglycerin  Cardiovascular: HPI  Pulmonary: HPI  GI: + nausea  GU: No dysuria or polyuria  Heme: Easy bruisability   Endo: No DM, thyroid normal  Musculoskeletal: Chronic back pains  Neuro: Frequent unsteadiness    PHYSICAL EXAM:  BP 130/78  Pulse 80  Ht 1.829 m (6')  Wt 84.369 kg (186 lb)  BMI 25.22 kg/m2  General: alert, full, NAD  HEENT: anicteric, MMM, no E/E OP, conj pink, no arcus senilis   Neck: no JVD, bruits, or LAD  CV: RRR, ns1/s2, no murmurs, rubs, or gallops  Pulm: CTA B  Abd: soft, NT, ND, +BS  Ext: no edema or cyanosis, 2+ distal pulses  Neuro: no gross focal deficits  Skin: ecchymosis on left forearm    Coronary Angiogram (04/04/11):  One vessel coronary artery disease (LAD). Mildly reduced left ventricular function. Successful percutaneous coronary intervention (bare metal stenting) of the left anterior descending artery. Succesful clot extraction form D2.     Coronary Angiogram (07/22/11):  Mild (non-obstructive) coronary artery disease. Normal LV systolic function.     SPECT (05/25/12):  PERFUSION: Myocardial perfusion SPECT imaging demonstrates: (1) Probably normal rest and stress myocardial pefusion, as noted; (2) No evidence for exercise stress-induced ischemia. (3) Compared to the prior study 04/11/2011, stress and rest myocardial perfusion images appear improved.   FUNCTION: ECG-gated SPECT myocardial wall motion study shows: (1) Normal biventricular size and performance. (2) Compared to the prior study 04/11/2011, mild regional dysfunction of the apex, peri-apex, and anterolateral wall appear resolved        Assessment & Plan:   [name] is a 47 y.o. with prior anterior STEMI that was seen today for atypical chest pains.      1) Palpitations:  Symptoms do not appear to correlate with atrial or ventricular arrhythmias despite  severe episodes while wearing the Holter monitor.  He does have infrequent PVCs that may be contributing to a small degree.  However, I  suspect ongoing anxiety is the driving force.  He was previously on beta blockade, but did have lightheadedness and given lower blood pressures will defer restarting this.     2) Coronary artery disease and chest pains:  Continued sublingual nitroglycerin usage over the last few weeks with some resolution of symptoms.  His repeat angiogram and SPECT confirmed stable coronary disease.  He could be having intermittent coronary spasm, but I think this is unlikely.  Will defer titration of Imdur and continue SL NG for breakthrough episodes.    - Continue aspirin, lisinopril, amlodipine,and atorvastatin   - Can discontinue Ticagrelor   - SL NG as needed     3) Hypertension:  Systolic and diastolic blood pressure are at goal today on current regimen.    - Continue current medications   - Low sodium diet (<4 g/day)   - Electrolytes within normal range (05/26/12)    4) Hyperlipidemia: LDL and non-HDL below goal of <70 and <161 mg/dL on recent blood work (09/60/45).    - Continue current atorvastatin dosing    - Obtain fasting lipid panel annually        [name], MD  Cardiovascular Disease    Suggest follow-up annually.  Thank you for allowing [place] to participate in this patient's care.  Please call or e-mail ([email or web address]) with any questions or concerns regarding his care.

## 2012-07-09 ENCOUNTER — Encounter: Payer: Self-pay | Admitting: Primary Care

## 2012-07-09 ENCOUNTER — Ambulatory Visit: Payer: Self-pay | Admitting: Primary Care

## 2012-07-09 VITALS — BP 110/64 | HR 72 | Resp 16 | Ht 72.0 in | Wt 188.0 lb

## 2012-07-09 DIAGNOSIS — M79602 Pain in left arm: Secondary | ICD-10-CM

## 2012-07-10 ENCOUNTER — Other Ambulatory Visit: Payer: Self-pay | Admitting: Cardiology

## 2012-07-19 NOTE — Progress Notes (Signed)
S:  Mr. Jamaica presents to the office with his wife with complaints of left elbow pain for the past 2 weeks.    He denies any recent or past injury to his elbow.  He is uncertain as to what may have triggered his symptoms but notes that he does do a significant amount of work with his hands both at work and at home.  The pain radiates down his left forearm and up to his left shoulder.  He is aware of intermittent dullness and tingling in the 3rd, 4th and 5th fingers of the left hand.  He denies any weakness in his arms or hands.  He has not noticed any swelling, warmth or discoloration.  He denies any clicking, popping, locking, grinding or instability with range of motion of his left shoulder and elbow.  He denies any pain in his neck, back, shoulders, right elbow or wrists.  He has tried applying cold packs with some temporary relief.  He has not taken any medication to treat his symptoms so far.    He continues with his usual medications.  He denies any problems with headaches, dizziness, vision changes, chest pain, palpitations, shortness of breath, heartburn, nausea, diarrhea, constipation or fatigue.    O:  General: Alert, appropriate, pleasant man in no apparent distress.  Neck: Supple, no lymphadenopathy, no thyromegaly, 2+ carotid pulses bilaterally.  Heart: Regular rate and rhythm, no murmur.  Lungs: Clear to auscultation bilaterally.  Abdomen: Positive bowel sounds, soft, nontender, nondistended, no masses, no organomegaly.  Extremities: 2+ radial and 2+ posterior tibial pulses bilaterally.  No clubbing, cyanosis or edema.  MSK: Moderate tenderness surrounding the left lateral epicondyle.  No crepitance or instability with passive range of motion of the left elbow.  No erythema, edema, induration, warmth, ecchymosis, fluctuance, bogginess or overlying skin lesions.  Neuro: No sensory or motor deficits of the upper extremities.  Brisk, uniform biceps and brachioradialis tendon reflexes bilaterally.  5/5  muscle strength in the flexors and extensors of the shoulders, elbows, wrists and hands bilaterally.    Procedure: The risks, benefits and nature of performing a Depo-Medrol injection to the left lateral epicondyle was discussed with the patient and all questions were answered.  Signed consent was obtained.  Patient sat in a chair adjacent to the procedure table with his left forearm and hand resting on the table.  The skin superficial to the left lateral epicondyle was cleansed with rubbing alcohol and allowed to dry.  Ethyl chloride spray was used for partial anesthesia.  A 25-gauge needle was inserted subcutaneously and advanced to the distal margin of the left lateral epicondyle.  60 mg of Depo-Medrol was injected.  The needle was withdrawn and the injection site was covered with a sterile Band-Aid.  There were no complications and the patient tolerated the procedure well.  Estimated blood loss was minimal.    A:  Left arm pain -- Depo-Medrol injection provided today    P:  1.  Monitor the injection site for evidence of persistent bleeding, unmanageable pain or infection and RTO immediately if such problems develop.  2.  May use heat, cold, Tylenol, Advil, a forearm strap or a soft elbow support as needed for management of symptoms.  3.  Instructed patient on appropriate stretching exercises.  4.  Minimize or avoid stress or strain to the left forearm and hand while symptoms persist.  5.  RTO as needed if any further problems or concerns in regards to the above.

## 2012-08-02 ENCOUNTER — Ambulatory Visit: Payer: Self-pay | Admitting: Primary Care

## 2012-08-02 ENCOUNTER — Encounter: Payer: Self-pay | Admitting: Primary Care

## 2012-08-02 VITALS — BP 116/66 | Temp 98.5°F | Ht 72.0 in | Wt 189.6 lb

## 2012-08-02 DIAGNOSIS — M25512 Pain in left shoulder: Secondary | ICD-10-CM

## 2012-08-02 DIAGNOSIS — M79602 Pain in left arm: Secondary | ICD-10-CM

## 2012-08-02 DIAGNOSIS — R079 Chest pain, unspecified: Secondary | ICD-10-CM

## 2012-08-02 DIAGNOSIS — M545 Low back pain, unspecified: Secondary | ICD-10-CM

## 2012-08-11 ENCOUNTER — Other Ambulatory Visit: Payer: Self-pay | Admitting: Primary Care

## 2012-08-14 NOTE — Progress Notes (Signed)
S:  [name] presents to the office with complaints of pain on the right side of his chest, left shoulder, left arm and low back.    He contends with a variety of minor musculoskeletal pains on a regular basis.  His recent symptoms are worse than usual.  He reports that 4 days ago he was moving furniture in his home with his wife and believes that he strained his back in the process.  He has had pain present in his low back ever since.  His symptoms were further aggravated when lifting and mounting a bus tire at work 2 days ago.  The tire weighed at least 80 pounds.    He has had mild, intermittent pain present in his left arm and shoulder since his last appointment.  The Depo-Medrol injection I provided at his last appointment provided very little benefit.  The symptoms have been worse since lifting the tire at work 2 days ago.  He notes a continuous aching discomfort with intermittent muscle cramps and prickly sensations that radiate down to the fingers of his left hand.  He denies any clicking, popping, locking, grinding or instability with range of motion of his left shoulder.    He developed pain on the right side of his chest at about 7:30 a.m. this morning.  He was experiencing a continuous gnawing ache and tightness.  At 10:00 a.m. he decided to take some sublingual nitroglycerin.  After the second tablet his symptoms dissipated within a few minutes.  He has had no recurrence since.  He had no associated palpitations, shortness of breath, dizziness or sweats.    He continues with his usual medications.    O:  General: Alert, appropriate, pleasant man in no apparent distress.  Neck: Supple, no lymphadenopathy, no thyromegaly, 2+ carotid pulses bilaterally.  Heart: Regular rate and rhythm, no murmur.  Lungs: Clear to auscultation bilaterally.  Abdomen: Positive bowel sounds, soft, nontender, nondistended, no masses, no organomegaly.  Extremities: 2+ radial and 2+ posterior tibial pulses bilaterally.  No  clubbing, cyanosis or edema.  MSK: Mild tenderness over the anterior, lateral and posterior surfaces of the left shoulder.  Mild discomfort with range of motion of the shoulder.  No crepitance or instability.  Negative apprehension test.  Negative impingement sign.  Mild tenderness surrounding the left lateral epicondyle.  Mild tenderness over the perispinous muscles of the lumbar spine bilaterally.  Forward flexion of the back to 50 from vertical extension to 5.  Lateral flexion to 30 and shoulder rotation to 80 bilaterally.  Neuro: Patient reports dull sensation on the posterior surface of the left upper arm, medial surface of the left forearm and ulnar surface of the left wrist and hand, including the 4th and 5th fingers of the hand.  No motor deficits of the upper or lower extremities.  Brisk, uniform upper and lower extremity reflexes bilaterally.  5/5 muscle strength in the flexors and extensors of the shoulders, elbows, hips and knees bilaterally.    A:  1.  Chest pain -- resolved  2.  Low back pain  3.  Left shoulder pain  4.  Left arm pain    P:  1.  May use heat, cold, Tylenol, Advil and rest as needed for management of symptoms.  2.  Avoid stress or strain to the involved areas while symptoms persist.  3.  Instructed patient to remain off from work until otherwise advised.  He notes that his job is physically demanding and he does  not feel that he is able to continue to perform his usual task work given his current symptoms.  4.  RTO in 2 weeks for follow up of the above problems or sooner if any worsening problems or concerns.

## 2012-08-16 ENCOUNTER — Ambulatory Visit: Payer: Self-pay | Admitting: Primary Care

## 2012-08-16 ENCOUNTER — Encounter: Payer: Self-pay | Admitting: Primary Care

## 2012-08-16 VITALS — BP 102/78 | HR 72 | Resp 16 | Ht 72.0 in | Wt 188.0 lb

## 2012-08-16 DIAGNOSIS — M545 Low back pain, unspecified: Secondary | ICD-10-CM

## 2012-08-16 DIAGNOSIS — R079 Chest pain, unspecified: Secondary | ICD-10-CM

## 2012-08-16 DIAGNOSIS — M25512 Pain in left shoulder: Secondary | ICD-10-CM

## 2012-08-16 DIAGNOSIS — M79602 Pain in left arm: Secondary | ICD-10-CM

## 2012-08-23 ENCOUNTER — Encounter: Payer: Self-pay | Admitting: Gastroenterology

## 2012-08-26 NOTE — Progress Notes (Signed)
S:  Mr. Dimartino returns to the office for follow up of the recent pain on the right side of his chest, left shoulder, left arm and low back.    He reports that the pain in his low back and neck have been gradually diminishing since his last appointment.  The pain in his left arm and left shoulder are essentially unchanged.  He continues to be aware of intermittent pain on the right side of his chest as well.    He continues to have aching discomfort with intermittent muscle cramps and prickly sensations that radiate down from the left shoulder to the fingers of his left hand.  He denies any clicking, popping, locking, grinding or instability with range of motion of his left shoulder.    The pain on the right side of his chest continues to respond well to nitroglycerin which he has been taking on almost a daily basis since his last appointment.  He has had no associated palpitations, shortness of breath, dizziness or sweats.    He continues with his usual medications.    O:  General: Alert, appropriate, pleasant man in no apparent distress.  Neck: Supple, no lymphadenopathy, no thyromegaly, 2+ carotid pulses bilaterally.  Heart: Regular rate and rhythm, no murmur.  Lungs: Clear to auscultation bilaterally.  Abdomen: Positive bowel sounds, soft, nontender, nondistended, no masses, no organomegaly.  Extremities: 2+ radial and 2+ posterior tibial pulses bilaterally.  No clubbing, cyanosis or edema.  MSK: Mild tenderness over the anterior, lateral and posterior surfaces of the left shoulder.  Mild discomfort with range of motion of the shoulder.  No crepitance or instability.  Negative apprehension test.  Negative impingement sign.  Mild tenderness surrounding the left lateral epicondyle.  Minimal tenderness over the perispinous muscles of the lumbar spine bilaterally.  Forward flexion of the back to 80 from vertical extension to 15.  Lateral flexion to 30 and shoulder rotation to 80 bilaterally.  Neuro: Patient  reports dull sensation on the posterior surface of the left upper arm, medial surface of the left forearm and ulnar surface of the left wrist and hand, including the 4th and 5th fingers of the hand.  No motor deficits of the upper or lower extremities.  Brisk, uniform upper and lower extremity reflexes bilaterally.  5/5 muscle strength in the flexors and extensors of the shoulders, elbows, hips and knees bilaterally.    A:  1.  Chest pain -- intermittent  2.  Low back pain -- diminishing  3.  Left shoulder pain -- unchanged  4.  Left arm pain -- unchanged    P:  1.  May use heat, cold, Tylenol, Advil and rest as needed for management of symptoms.  2.  Avoid stress or strain to the involved areas while symptoms persist.  3.  Refer for physical therapy for further evaluation and management of the left shoulder and left arm pain.  4.  Instructed patient to continue to remain off from work until otherwise advised.  5.  RTO in 2 weeks for follow up of the above problems or sooner if any worsening problems or concerns.

## 2012-08-27 ENCOUNTER — Ambulatory Visit: Payer: Self-pay | Admitting: Orthopedic Surgery

## 2012-08-27 ENCOUNTER — Telehealth: Payer: Self-pay

## 2012-08-27 ENCOUNTER — Encounter: Payer: Self-pay | Admitting: Orthopedic Surgery

## 2012-08-27 VITALS — BP 123/66 | Ht 72.0 in | Wt 189.0 lb

## 2012-08-27 DIAGNOSIS — M25522 Pain in left elbow: Secondary | ICD-10-CM

## 2012-08-27 DIAGNOSIS — R52 Pain, unspecified: Secondary | ICD-10-CM

## 2012-08-27 MED ORDER — TRAMADOL HCL 50 MG PO TABS *I*: ORAL_TABLET | ORAL | Status: DC

## 2012-08-27 NOTE — Telephone Encounter (Signed)
Phone call made to [facility] to request authorization for MRI, left elbow without contrast.  Gave all the clinicals and clinical nurse said it needed to be reviewed by the doctor.  The case reference # [id number].  Phone # [phone]  Stated we have 2 days to do the review.  Thanks,   [employer]

## 2012-08-27 NOTE — Progress Notes (Signed)
History: [name] is a 47 y.o. that is being seen as a consult request from Dr. Janeal Holmes, MD for evaluation of Left elbow pain. The symptoms about 2 months and progressively getting worse.  The patient described that the only thing he could think about is bracing his fall from slipping on ice.  He has had a history of lateral epicondylitis, but this is worse.   The location of the pain is diffuse.  Aggravating factors include:movement, sitting, lifting, touching.  Alleviating factors include:  He had a cortisone injection about 1 month ago without little or no relief.  He went to one PT appointment where the therapist attempted heat but his swelling increased. Patient also states he has burning, numbness and tingling with poor grip strength.    Past medical history, past surgical history, medications, allergies, family history, social history, and review of systems were reviewed today and have been documented separately in this encounter.      Physical Examination:  He is in no acute distress.  He is alert and oriented x 3.   Examination of his neck demonstrates full cervical range of motion with no midline or paraspinal tenderness.    Examination of the left elbow:  The elbow extends to 5 degrees.  He flexes to 110 degrees with pain.  He pronates to 60 degrees and supinates to 80 degrees.  He is tender to palpation over the lateral epicondyle.  He is tender over the medial epicondyle.   He is not tender over the olecranon.  He is tender at the radial head.  He is tender over the distal biceps tendon.  His elbow is stable to varus stress.  His elbow is stable to valgus stress.  Milking maneuver was negative.  He has grade 5/5 wrist flexion. He has grade 4/5 wrist extension.  Resisted wrist extension causes moderate pain.  Resisted wrist flexion cause mild pain.  Resisted supination causes moderate pain.  Resisted pronation causes mild pain.  Distally  he is neurovascularly intact. He does have an  effusion    Imaging: I personally reviewed the patients images. Xray's demonstrate no evidence of fractures.    Assessment:Left possible lateral epicondylitis vs distal bicep tear, questions neurogenic    Plan:  I discuss the diagnosis and the treatment options with the patient.  Given the diffuse pain and the lack of motion due to pain I would like to evaluate further with an MRI. We will see the patient back after the MR is obtained. He was taken out of work at this time and #40 Ultram was given to use with his Advil.  He is on Zoloft PRN and will d/c the Zoloft if using the Ultram.

## 2012-08-30 ENCOUNTER — Ambulatory Visit: Payer: Self-pay | Admitting: Primary Care

## 2012-08-30 ENCOUNTER — Encounter: Payer: Self-pay | Admitting: Primary Care

## 2012-08-30 ENCOUNTER — Other Ambulatory Visit: Payer: Self-pay | Admitting: Cardiology

## 2012-08-30 VITALS — BP 120/68 | HR 80 | Resp 16 | Ht 72.0 in | Wt 190.0 lb

## 2012-08-30 DIAGNOSIS — M545 Low back pain, unspecified: Secondary | ICD-10-CM

## 2012-08-30 DIAGNOSIS — M79602 Pain in left arm: Secondary | ICD-10-CM

## 2012-08-30 DIAGNOSIS — R079 Chest pain, unspecified: Secondary | ICD-10-CM

## 2012-08-30 MED ORDER — AMLODIPINE BESYLATE 5 MG PO TABS *I*
5.0000 mg | ORAL_TABLET | Freq: Every day | ORAL | Status: DC
Start: 2012-08-30 — End: 2013-08-09

## 2012-08-30 NOTE — Telephone Encounter (Signed)
Completed a Clinical research associate number: [phone]

## 2012-09-07 ENCOUNTER — Encounter: Payer: Self-pay | Admitting: Primary Care

## 2012-09-07 ENCOUNTER — Ambulatory Visit: Payer: Self-pay | Admitting: Primary Care

## 2012-09-07 VITALS — BP 108/72 | Wt 191.0 lb

## 2012-09-07 DIAGNOSIS — M545 Low back pain, unspecified: Secondary | ICD-10-CM

## 2012-09-07 DIAGNOSIS — M25512 Pain in left shoulder: Secondary | ICD-10-CM

## 2012-09-07 DIAGNOSIS — M79602 Pain in left arm: Secondary | ICD-10-CM

## 2012-09-08 ENCOUNTER — Other Ambulatory Visit: Payer: Self-pay | Admitting: Cardiology

## 2012-09-08 DIAGNOSIS — I2510 Atherosclerotic heart disease of native coronary artery without angina pectoris: Secondary | ICD-10-CM

## 2012-09-08 MED ORDER — ISOSORBIDE MONONITRATE CR 30 MG PO TB24 *I*
30.0000 mg | ORAL_TABLET | Freq: Every day | ORAL | Status: DC
Start: 2012-09-08 — End: 2013-09-05

## 2012-09-13 ENCOUNTER — Ambulatory Visit: Payer: Self-pay | Admitting: Sports Medicine

## 2012-09-13 ENCOUNTER — Encounter: Payer: Self-pay | Admitting: Sports Medicine

## 2012-09-13 VITALS — BP 118/59 | Ht 72.0 in | Wt 194.4 lb

## 2012-09-13 DIAGNOSIS — M25522 Pain in left elbow: Secondary | ICD-10-CM

## 2012-09-13 DIAGNOSIS — M7710 Lateral epicondylitis, unspecified elbow: Secondary | ICD-10-CM | POA: Insufficient documentation

## 2012-09-13 NOTE — Progress Notes (Signed)
[name], [name] MR #:  [id number]   ACCOUNT #:  [id number] DOB:  04-09-1966   DICTATED BY:  Lanae Boast, MD DATE OF VISIT:  09/13/2012     MRI followup left elbow, showing a partial tear of the extensor tendon.  This is consistent with his point of maximal tenderness and his continued weakness.  He states he has tried rest, oral anti-inflammatories, a corticosteroid injection, Tramadol, and therapy, None of which has given him any significant improvement with regards to his pain, weakness, or functional limitations.  [name] did take him out of work at his last visit. He has had these symptoms now for almost 3 months.       I discussed it is a bit early to jump to something surgical, but he certainly has exhausted all other modes of treatment.  In his words he wants to get this taken care of as soon as possible.  He does not want to wait for another injection nor continue a retry of physical therapy.  We discussed left elbow open exploration of the extensor tendon, debridement and repair.  I explained it would take about 30 minutes, he would be a soft Ace wrap compression dressing for a week and then begin the hand therapy.  He has some left shoulder complaints, which I think are compensatory and can also be addressed with the postsurgical physical therapy.       Risks and benefits were thoroughly reviewed.  Questions invited and answered to his satisfaction.  He will contact us with regard to scheduling.             ______________________________  Lanae Boast, MD    MM/MODL  DD:  09/13/2012 13:05:51  DT:  09/13/2012 14:56:04  Job #:  [id number]    cc: Lanae Boast, MD

## 2012-09-13 NOTE — Progress Notes (Signed)
This office note has been dictated.    Shoulder   []  Right      []  Left        []  Rotator Cuff [id number], 727.61        []  Rotator Cuff Debridement, 98119, 726.1        []  Subacromial Decompression/Acromioplasty, [phone]        []  Distal Clavicle Excision, 29824        []  Capsulorrhaphy/Capsular Plication, [id number], 718.31        []  Capsulolabral/SLAP Repair, [id number], 718.31        []  Labral Debridement        []  Synovectomy, (full: 29821 partial:29822), 727.01        []  Laterjet, Open        []  Manual Manipulation with Lysis of Adhesions        []  Bicep Tenodesis/Tenotomy, 726.12        []  Diagnostic        []  Other:       Knee  []  Right      []  Left        []  Chondroplasty, 29877, 715.96        []  Partial Medial Menisectomy, 29881, 836.0        []  Partial Lateral Menisectomy, 29881, 836.1        []  Medial Meniscal Repair, 95621        []  Lateral Meniscal Repair, 30865        []  ACL reconstruction with Allograft, [id number]        []  ACL reconstruction with Autograft, [id number]        []  Synovectomy, (full: 29876 partial:29875), 727.01        []  Plica Excision, 727.83        []  Lateral Release, [phone]        []  Patella-Femoral Realignment, Open        []  Cyst Fenestration        []  Loose Body removal, 62952        []  Diagnostic        []  Patella Tendon Repair, Open        []  Quadricep Tendon Repair, Open        []  Other:    Elbow  []  Right      [x]  Left        []  Arthroscopic  Extensive Debridement, 84132        []  UCL reconstruction        [x]  Debridement of the Common Extensor, Open        []  Loose Body Removal        []  Other:            WC or MVA?        []  Yes        [x]  No        Need for further Work?        []  Yes; What is needed?        [x]  No

## 2012-09-14 ENCOUNTER — Encounter: Payer: Self-pay | Admitting: Sports Medicine

## 2012-09-14 NOTE — OR PreOp (Signed)
[facility] Pre-operative Guidelines        Chart given to [facility] for research then C.Smith,NP, for review:    MI 04/04/11  Angioplasty, Card: Lulu Riding @ [facility], last appt 05/28/12.  BMS LAD    Day of surgery arrival Time:  The surgery center will call you the business day before your procedure between 3 and 4:30pm.     Eating and drinking guidelines:  No solid foods after midnight the night before your procedure.  Adults-  May have clear liquids (water, soda or apple juice only) up to 4 hours prior to your arrival time and then nothing by mouth.  Pediatrics-  All but dental patients may have clear liquids (water, soda, or apple juice only) up to 3 hours before arrival time.   Dental patients are NPO after midnight.  Breast fed infants may have breast milk up to 4 hours before arrival.  Formula fed infants may have formula up to 6 hours before arrival.    Medications:  Medications to be taken on the day of surgery:HOLD lisinopril after 6pm eve before & AM DOS; take isosorbide,  Amlodipine, omeprazole.    Items to bring on the day of surgery:  Insurance card, photo ID and healthcare proxy.  If you use a CPAP for sleep apnea, please bring your mask with you.  Your crutches, knee brace or arm sling. Please leave your crutches in the car.    Clothing, Jewelry, valuables and eyeglass case:  Wear loose fitting clothing that will fit over a bulky dressing.  Leave all jewelry and valuables at home.  Bring your eyeglasses and eyeglass case. You cannot wear contact lenses.     Pediatrics:  A parent or legal guardian must accompany and remain in the building at all times. We recommend that two adults accompany the patient home while riding in a car; one to drive the vehicle and one to assist with the care of the child.   Bring the child's favorite comfort item (i.e. blanket, doll or toy).    Surgeon Instructions:  Please read all pre-operative instructions carefully, and follow your surgeons guidelines if different  from these instructions.

## 2012-09-14 NOTE — Progress Notes (Signed)
S:  Mr. Eisenreich returns to the office for follow up of his recent chest, low back, left shoulder and left arm pain.    He reports that the pain in his low back and neck have remained unchanged since his last appointment.  The pain in his left arm and left shoulder are unchanged as well.  He is no longer experiencing pain on the right side of his chest but has been experiencing intermittent pain on the left side of his chest since his last appointment.  The pain on left side of his chest does not feel like his previous angina.  It does not respond to the nitroglycerin.  It can be aggravated by changes in position.  He denies any associated palpitations, shortness of breath, dizziness or sweats.    He continues to experience aching discomfort with intermittent muscle cramps and prickly sensations that radiate down from the left shoulder to the fingers of his left hand.  He denies any clicking, popping, locking, grinding or instability with range of motion of his left shoulder.    He did initiate physical therapy.  This seemed to be aggravating his symptoms.  It was suggested that he speak with an orthopedic surgeon.  He was seen at the [facility] office and was referred for a left elbow MRI which was performed on 09/03/12.  It identified a partial tear of the common extensor tendon of the left forearm.    He continues with his usual medications.    O:  General: Alert, appropriate, pleasant man in no apparent distress.  Neck: Supple, no lymphadenopathy, no thyromegaly, 2+ carotid pulses bilaterally.  Heart: Regular rate and rhythm, no murmur.  Lungs: Clear to auscultation bilaterally.  Abdomen: Positive bowel sounds, soft, nontender, nondistended, no masses, no organomegaly.  Extremities: 2+ radial and 2+ posterior tibial pulses bilaterally.  No clubbing, cyanosis or edema.  MSK: Mild tenderness over the anterior, lateral and posterior surfaces of the left shoulder.  Mild discomfort with range  of motion of the shoulder.  No crepitance or instability.  Negative apprehension test.  Negative impingement sign.  Mild tenderness surrounding the left lateral epicondyle.  Minimal tenderness over the perispinous muscles of the lumbar spine bilaterally.  Forward flexion of the back to 80 from vertical extension to 15.  Lateral flexion to 30 and shoulder rotation to 80 bilaterally.  Neuro: Patient reports dull sensation on the posterior surface of the left upper arm, medial surface of the left forearm and ulnar surface of the left wrist and hand, including the 4th and 5th fingers of the hand.  No motor deficits of the upper or lower extremities.  Brisk, uniform upper and lower extremity reflexes bilaterally.  5/5 muscle strength in the flexors and extensors of the shoulders, elbows, hips and knees bilaterally.    A:  1.  Chest pain -- intermittent, positional  2.  Low back pain -- unchanged  3.  Left shoulder pain -- unchanged  4.  Left arm pain -- unchanged    P:  1.  May use heat, cold, Tylenol, Advil and rest as needed for management of symptoms.  2.  Avoid stress or strain to the involved areas while symptoms persist.  3.  Follow up with orthopedics as scheduled.  4.  Remain off from work until otherwise advised.  5.  RTO in 2 months for follow up of low back pain or sooner if any worsening problems or concerns.

## 2012-09-15 NOTE — Consults (Signed)
Chart review requested for a history of NSTEMI with stenting of the LAD.  He was seen in the ER in December for chest pain unrelieved by NTG x 4.  He went for a  nuclear stress test the following day, achieving 10 METS.  Normal rest and stress perfusion.  Normal biventricular size and performance.  Compared to prior study 10/12, mild regional dysfunction of the apex, peri-apex and anterolateral wall appear resolve.     Cardiac cath 02/25/12 showed mild to moderate CAD with widely patent stent.  Anatomy is essentially identical to prior angiogram.  Preserved LV systolic function.  He was not scheduled for a PRAT evaluation.

## 2012-09-19 NOTE — Progress Notes (Signed)
S:  [name] returns to the office for follow up of his recent left shoulder, left arm and low back pain.    He reports that the pain in his low back and neck have remained unchanged since his last appointment.  The pain in his left arm and left shoulder are unchanged as well.  He is no longer experiencing pain on the right side of his chest.  He continues to experience aching discomfort with intermittent muscle cramps and prickly sensations and dullness that radiates down from the left shoulder to the fingers of his left hand.  He denies any clicking, popping, locking, grinding or instability with range of motion of his left shoulder.  He notes moderate tenderness on the lateral surface of his left elbow.    He notes that he completed the MRI of his left elbow on 09/03/12.  It was notable for a partial tear of the common extensor tendon attached to the left lateral epicondyle.  He has been taking tramadol for his pain with some temporary benefit.  He has suspended physical therapy.    He is scheduled to follow up with Dr. Elease Hashimoto at [facility] in 6 days.  He remains off from work.    He continues with his usual medications.    O:  General: Alert, appropriate, pleasant man in no apparent distress.  Neck: Supple, no lymphadenopathy, no thyromegaly, 2+ carotid pulses bilaterally.  Heart: Regular rate and rhythm, no murmur.  Lungs: Clear to auscultation bilaterally.  Abdomen: Positive bowel sounds, soft, nontender, nondistended, no masses, no organomegaly.  Extremities: 2+ radial and 2+ posterior tibial pulses bilaterally.  No clubbing, cyanosis or edema.  MSK: Mild tenderness over the anterior, lateral and posterior surfaces of the left shoulder.  Mild discomfort with range of motion of the shoulder.  No crepitance or instability.  Negative apprehension test.  Negative impingement sign.  Mild tenderness surrounding the left lateral epicondyle.  Minimal tenderness over the perispinous muscles of the lumbar spine  bilaterally.  Forward flexion of the back to 80 from vertical extension to 15.  Lateral flexion to 30 and shoulder rotation to 80 bilaterally.  Neuro: Patient reports dull sensation on the posterior surface of the left upper arm, medial surface of the left forearm and ulnar surface of the left wrist and hand, including the 4th and 5th fingers of the hand.  No motor deficits of the upper or lower extremities.  Brisk, uniform upper and lower extremity reflexes bilaterally.  5/5 muscle strength in the flexors and extensors of the shoulders, elbows, hips and knees bilaterally.    A:  1.  Left shoulder pain -- intermittent, positional  2.  Left arm pain -- unchanged  3.  Low back pain -- unchanged    P:  1.  May use heat, cold, Tylenol, Advil and rest as needed for management of symptoms.  2.  Avoid stress or strain to the involved areas while symptoms persist.  No lifting greater than 5 pounds at a time with the left hand and arm.  3.  Follow up with orthopedics as scheduled.  4.  Remain off from work until otherwise advised.  5.  A temporary disability form from [employer] was reviewed and completed with the patient during today's appointment.  6.  RTO in 6 weeks as already scheduled or sooner if any worsening problems or concerns.

## 2012-09-20 NOTE — Anesthesia Pre-procedure Eval (Signed)
Anesthesia Pre-operative Evaluation for [name]      47 year old male for knee arthroscopy     Evaluated by [facility] in the form of a chart review for a history of NSTEMI with stenting of the LAD. He was seen in the ER in December for chest pain unrelieved by NTG x 4. He went for a nuclear stress test the following day, achieving 10 METS. Normal rest and stress perfusion. Normal biventricular size and performance. Compared to prior study 10/12, mild regional dysfunction of the apex, peri-apex and anterolateral wall appear resolve.   Cardiac cath 02/25/12 showed mild to moderate CAD with widely patent stent. Anatomy is essentially identical to prior angiogram. Preserved LV systolic function.     Health History  Past Medical History   Diagnosis Date   . Prostatitis    . Anxiety    . Benign prostatic hypertrophy    . Depression    . Discogenic syndrome    . Nicotine dependence    . Coronary artery disease    . Cardiac arrest    . Palpitations      not currently   . HTN (hypertension) 04/10/2011   . Myocardial infarction 04/04/11     stemi @ [facility]   . STEMI (ST elevation myocardial infarction) 04/04/2011   . Back pain      lumbar   . Chest pain, unspecified      occurred 12/13     Past Surgical History   Procedure Laterality Date   . Ankle surgery     . Coronary angioplasty  04/04/11     with BMS to LAD   . Ankle surgery     . Cardiac catheterization  07/2011     mild nonobstructive disease-stent patent   . Coronary angioplasty with stent placement  04/04/11     BMS to LAD     Social History  History   Substance Use Topics   . Smoking status: Former Smoker -- 0.80 packs/day for 24 years     Types: Cigarettes     Quit date: 04/04/2011   . Smokeless tobacco: Former Neurosurgeon   . Alcohol Use: No      History   Drug Use No     ______________________________________________________________________  Allergies:   Allergies   Allergen Reactions   . Latex      Hands peel     Prior to Admission Medications              Last Dose Start  Date End Date Provider     amLODIPine (NORVASC) 5 MG tablet 09/21/2012 at 0800  08/30/12  --  Prinzi, Dot Lanes, NP     Take 1 tablet (5 mg total) by mouth daily     aspirin 81 MG EC tablet 09/21/2012 at 0800  04/07/11  --  Shores, [place], [place]     Take 1 tablet (81 mg total) by mouth daily       atorvastatin (LIPITOR) 40 MG tablet 09/20/2012 at 2300  07/10/12  --  Koleen Distance, NP     take 1 tablet by mouth once daily     cyclobenzaprine (FLEXERIL) 10 MG tablet Past Week at Unknown time  06/03/11  --  Janeal Holmes, MD     take 1 tablet by mouth three times a day if needed     isosorbide mononitrate (IMDUR) 30 MG 24 hr tablet 09/20/2012 at 2330  09/08/12  --  Koleen Distance, NP  Take 1 tablet (30 mg total) by mouth daily   Swallow whole. Do not crush, break, or chew.     lisinopril (PRINIVIL,ZESTRIL) 5 MG tablet 09/21/2012 at Unknown time  07/10/12  --  Koleen Distance, NP     take 1 tablet by mouth once daily     nitroglycerin (NITROSTAT) 0.4 MG SL tablet Past Month at Unknown time  06/21/12  --  [name], MD     Place 1 tablet (0.4 mg total) under the tongue every 5 minutes as needed   May repeat 2 times then call 911 if pain persists.     omeprazole (PRILOSEC) 20 MG capsule 09/21/2012 at 0800  08/11/12  --  Janeal Holmes, MD     take 1 capsule by mouth twice a day     sertraline (ZOLOFT) 100 MG tablet 09/20/2012 at 2330  02/21/12  --  Janeal Holmes, MD     take 2 tablets by mouth once daily     traMADol (ULTRAM) 50 MG tablet Past Week at Unknown time  08/27/12  --  Raechel Ache, PA     Use 1-2 tablets PO Q 4-6 hours PRN pain        Current Outpatient Prescriptions   Medication Sig   . isosorbide mononitrate (IMDUR) 30 MG 24 hr tablet Take 1 tablet (30 mg total) by mouth daily   Swallow whole. Do not crush, break, or chew.   [name] amLODIPine (NORVASC) 5 MG tablet Take 1 tablet (5 mg total) by mouth daily   . traMADol (ULTRAM) 50 MG tablet Use 1-2 tablets PO Q 4-6 hours PRN pain   . omeprazole (PRILOSEC) 20 MG capsule  take 1 capsule by mouth twice a day   . lisinopril (PRINIVIL,ZESTRIL) 5 MG tablet take 1 tablet by mouth once daily   . atorvastatin (LIPITOR) 40 MG tablet take 1 tablet by mouth once daily   . nitroglycerin (NITROSTAT) 0.4 MG SL tablet Place 1 tablet (0.4 mg total) under the tongue every 5 minutes as needed   May repeat 2 times then call 911 if pain persists.   . sertraline (ZOLOFT) 100 MG tablet take 2 tablets by mouth once daily   . cyclobenzaprine (FLEXERIL) 10 MG tablet take 1 tablet by mouth three times a day if needed   . aspirin 81 MG EC tablet Take 1 tablet (81 mg total) by mouth daily     . oxyCODONE-acetaminophen (PERCOCET) 5-325 MG per tablet Take 1-2 tablets by mouth every 4-6 hours as needed for Pain   MDD:8     Current Facility-Administered Medications   Medication   . lidocaine 1 % injection 0.1 mL   . Lactated Ringers Infusion   . ceFAZolin 1gm syringe (OR ONLY)     Admission Medications:  Scheduled MedsIV MedsPRN Meds  Anesthesia EvaluationInformation Source: per records, per family, per patient  General     Denies general issues  Pertinent (-):  history of anesthetic complications or FamHx of anesthetic complications    HEENT    + Corrective Eyewear            glasses    Pulmonary     + Smoker            former  Pertinent (-):  asthma, recent URI or snoring Cardiovascular  Excellent(10+METs) Exercise Tolerance    + Hypertension            well controlled    + Past MI    +  Angina (atypical chest pain - occasional and not related to exertion)    + CAD    + Dysrhythmias (following cardiac cath in the past )    Comment:MI in 03/2011, to cath lab for clot extraction and stent placement.     Evaluated by cardiology on a regular basis. Last angiogram in September revealed nonobstructive CAD and a widely patent stent.         GI/Hepatic/Renal  Last PO Intake: >8hr before procedure      + GERD            well controlled Neuro/Psych    + Psychiatric Issues          anxiety, depression    Endo/Other     Denies endo issues    Hematologic    + Blood dyscrasia            hyperlipidemia    + Arthritis            back and shoulders     Nursing Reported PO Status: Date Last PO Fluids: 09/21/12 0915 (orange soda)  Date Last PO Solids: 09/20/12 2330  ______________________________________________________________________  Physical Exam    Airway            Mouth opening: normal            Mallampati: I            TM distance (fb): >3 FB            Neck ROM: full  Dental   Normal Exam       Cardiovascular  Normal Exam           Rhythm: regular           Rate: normal  No murmur     Pulmonary   pulmonary exam normal    breath sounds clear to auscultation    Mental Status   Normal  evaluation    oriented to person, place and time         Most Recent Vitals: BP: 120/81 mmHg (09/21/12 1356)  Heart Rate: 73 (09/21/12 1356)  Temp: 36.4 C (97.5 F) (09/21/12 1356)  Resp: 16 (09/21/12 1356)  Height: 182.9 cm (6') (09/21/12 1356)  Weight: 86.093 kg (189 lb 12.8 oz) (09/21/12 1356)  BMI (Calculated): 25.8 (09/21/12 1356)  SpO2: 96 % (09/21/12 1356)    Vital Sign Ranges (last 24hrs)  Temp:  [36.4 C (97.5 F)] 36.4 C (97.5 F)  Heart Rate:  [73] 73  Resp:  [16] 16  BP: (120)/(81) 120/81 mmHg   O2 Device: None (Room air) (09/21/12 1356)    Most Recent Lab Results   Blood Type  No results found for this basename: aborh,  abs   CBC  Lab Results   Component Value Date    WBC 5.9 07/30/2011    HCT 40 07/30/2011    PLT 259 07/30/2011   Chem-7  Lab Results   Component Value Date    NA 138 05/26/2012    K 4.1 05/26/2012    CL 103 05/26/2012    CO2 26 05/26/2012    UN 17 05/26/2012    CREAT 0.73 05/26/2012    GLU 114* 05/26/2012   Estimated Creatinine Clearance: 137.31 ml/min (based on Cr of 0.73).  Electrolytes  Lab Results   Component Value Date    CA 8.7 05/26/2012    MG 1.7 04/10/2011    PO4 3.5 04/10/2011   Coags  Lab Results  Component Value Date    PTI 12.0 07/21/2011    INR 1.2 07/21/2011    PTT 31.0 07/21/2011   LFTs  Lab Results   Component  Value Date    AST 27 05/26/2012    ALT 34 05/26/2012    ALK 75 05/26/2012     Bilirubin,Total   Date Value Range Status   05/26/2012 0.3  0.0 - 1.2 mg/dL Final     Pregnancy Status:   No LMP for male patient.    No results found for this basename: PUPT,  UPREG,  SPREG,  HCG1,  BHCG2,  BHCG,  HCGB     ECG Results  Lab Results   Component Value Date/Time    RATE 68 05/24/2012  1:39 PM    RATE 62 02/25/2012 12:26 PM    STATEMENT SINUS RHYTHM 05/24/2012  1:39 PM    STATEMENT NONSPECIFIC INTRAVENTRICULAR CONDUCTION DELAY 05/24/2012  1:39 PM    STATEMENT SINUS RHYTHM 02/25/2012 12:26 PM    STATEMENT NONSPECIFIC T ABNORMALITIES, LATERAL LEADS 02/25/2012 12:26 PM     ANES CPM    Radiology: No relevant studies     ________________________________________________________________________  Medical Problems  Patient Active Problem List    Diagnosis Date Noted   . CAD (coronary atherosclerotic disease) 04/21/2011     Priority: High   . Chest pain 04/10/2011     Priority: High   . HTN (hypertension) 04/10/2011     Priority: High   . STEMI (ST elevation myocardial infarction) 04/04/2011     Priority: High   . Palpitations      Priority: Medium   . Hyperlipidemia 04/10/2011     Priority: Medium   . Anxiety Disorder NOS 03/06/2008     Priority: Medium     Created by Conversion       . Depression 03/06/2008     Priority: Medium     Created by Conversion       . Lumbar degenerative disc disease 03/10/2011     Priority: Low   . Discogenic Syndrome 03/06/2008     Priority: Low     Created by Conversion  [facility] Annotation: Mar 06 2008  5:56PM - Gardiner Fanti: LUMBAR SPINE       . Benign Prostatic Hypertrophy 03/06/2008     Priority: Low     Created by Conversion       . Lateral epicondylitis  of elbow 09/13/2012   . Left elbow pain 08/27/2012       PreOp/PreProcedure Diagnosis (For more detail see procedural consent)            Elbow tendonitis  Planned Procedure (For more detail see procedural consent)            Elbow exploration with  debridement and repair  Plan   ASA Score 3  Anesthetic Plan (general); Induction (routine IV); Airway (LMA); Line ( use current access); Monitoring (standard ASA); Positioning (supine); Pain (per surgical team and intraop local); PostOp (PACU)    Informed Consent     Risks:          Risks discussed were commensurate with the plan listed above with the following specific points:  N/V, sore throat , damage to:(eyes, nerves, teeth), allergic Rx, unexpected serious injury, death    Anesthetic Consent:         Anesthetic plan and risks discussed with:  patient and spouse    Plan discussed with:  CRNA and surgeon      Attending  Attestation: The patient or proxy understand and accept the risks and benefits of the anesthesia plan. By accepting this note, I attest that I have personally performed the history and physical exam and prescribed the anesthetic plan within 48 hours prior to the anesthetic as documented by me above.    Author: Briscoe Deutscher, MD

## 2012-09-21 ENCOUNTER — Ambulatory Visit
Admit: 2012-09-21 | Discharge: 2012-09-21 | Disposition: A | Discharge status: Home / Self Care | Payer: Self-pay | Source: Ambulatory Visit | Attending: Sports Medicine | Admitting: Sports Medicine

## 2012-09-21 MED ORDER — OXYCODONE-ACETAMINOPHEN 5-325 MG PO TABS *I*
1.0000 | ORAL_TABLET | ORAL | Status: AC | PRN
Start: 2012-09-21 — End: 2012-09-26

## 2012-09-21 MED ORDER — LIDOCAINE HCL 1 % IJ SOLN
INTRAMUSCULAR | Status: AC
Start: 2012-09-21 — End: 2012-09-21
  Filled 2012-09-21: qty 2

## 2012-09-21 MED ORDER — MIDAZOLAM HCL 1 MG/ML IJ SOLN *I* WRAPPED
INTRAMUSCULAR | Status: AC
Start: 2012-09-21 — End: 2012-09-21
  Filled 2012-09-21: qty 2

## 2012-09-21 MED ORDER — HALOPERIDOL LACTATE 5 MG/ML IJ SOLN *I*
1.0000 mg | Freq: Once | INTRAMUSCULAR | Status: AC | PRN
Start: 2012-09-21 — End: 2012-09-21

## 2012-09-21 MED ORDER — CEFAZOLIN 1000 MG IN STERILE WATER 10ML SYRINGE *I*
PREFILLED_SYRINGE | INTRAVENOUS | Status: AC
Start: 2012-09-21 — End: 2012-09-21
  Filled 2012-09-21: qty 20

## 2012-09-21 MED ORDER — OXYCODONE-ACETAMINOPHEN 5-325 MG/5ML PO SOLN *A*
5.0000 mL | Freq: Once | ORAL | Status: AC | PRN
Start: 2012-09-21 — End: 2012-09-21

## 2012-09-21 MED ORDER — LIDOCAINE HCL 1 % IJ SOLN
0.1000 mL | Freq: Once | INTRAMUSCULAR | Status: DC | PRN
Start: 2012-09-21 — End: 2012-09-22
  Administered 2012-09-21: 0.1 mL via SUBCUTANEOUS

## 2012-09-21 MED ORDER — KETOROLAC TROMETHAMINE 30 MG/ML IJ SOLN *I*
INTRAMUSCULAR | Status: AC
Start: 2012-09-21 — End: 2012-09-21
  Filled 2012-09-21: qty 1

## 2012-09-21 MED ORDER — DEXAMETHASONE SODIUM PHOSPHATE 4 MG/ML IJ SOLN
INTRAMUSCULAR | Status: AC
Start: 2012-09-21 — End: 2012-09-21
  Filled 2012-09-21: qty 1

## 2012-09-21 MED ORDER — FENTANYL CITRATE 50 MCG/ML IJ SOLN *WRAPPED*
INTRAMUSCULAR | Status: AC
Start: 2012-09-21 — End: 2012-09-21
  Filled 2012-09-21: qty 2

## 2012-09-21 MED ORDER — ONDANSETRON HCL 2 MG/ML IV SOLN *I*
INTRAMUSCULAR | Status: AC
Start: 2012-09-21 — End: 2012-09-21
  Filled 2012-09-21: qty 2

## 2012-09-21 MED ORDER — ONDANSETRON HCL 2 MG/ML IV SOLN *I*
1.0000 mg | Freq: Once | INTRAMUSCULAR | Status: AC | PRN
Start: 2012-09-21 — End: 2012-09-21

## 2012-09-21 MED ORDER — HYDROMORPHONE HCL PF 1 MG/ML IJ SOLN *WRAPPED*
0.5000 mg | INTRAMUSCULAR | Status: DC | PRN
Start: 2012-09-21 — End: 2012-09-22

## 2012-09-21 MED ORDER — PROMETHAZINE HCL 25 MG/ML IJ SOLN *I*
6.2500 mg | INTRAMUSCULAR | Status: DC | PRN
Start: 2012-09-21 — End: 2012-09-22

## 2012-09-21 MED ORDER — ETOMIDATE 2 MG/ML IV SOLN *I*
INTRAVENOUS | Status: AC
Start: 2012-09-21 — End: 2012-09-21
  Filled 2012-09-21: qty 10

## 2012-09-21 MED ORDER — LACTATED RINGERS IV SOLN *I*
20.0000 mL/h | INTRAVENOUS | Status: DC
Start: 2012-09-21 — End: 2012-09-22
  Administered 2012-09-21: 20 mL/h via INTRAVENOUS

## 2012-09-21 MED ORDER — CEFAZOLIN 1000 MG IN STERILE WATER 10ML SYRINGE *I*
2000.0000 mg | PREFILLED_SYRINGE | INTRAVENOUS | Status: DC
Start: 2012-09-21 — End: 2012-09-22

## 2012-09-21 MED ORDER — EPHEDRINE SULFATE 50 MG/ML IJ SOLN WRAPPED *I*
Status: AC
Start: 2012-09-21 — End: 2012-09-21
  Filled 2012-09-21: qty 1

## 2012-09-21 MED ORDER — LIDOCAINE HCL 2 % (PF) IJ SOLN *I*
INTRAMUSCULAR | Status: AC
Start: 2012-09-21 — End: 2012-09-21
  Filled 2012-09-21: qty 5

## 2012-09-21 NOTE — Anesthesia Post-procedure Eval (Signed)
Anesthesia Post-op Note    Patient: [name]    Anesthesia type: General    Patient location: PACU    Mental Status: Recovered to baseline    Patient able to participate in this evaluation: yes    Last Vitals:   Filed Vitals:    09/21/12 1630   BP: 112/70   Pulse: 68   Temp:    Resp: 16   Height:    Weight:        Post-op vital signs: stable    Post-op vital signs noted above are appropriate for condition    Respiratory function: baseline    Airway patent: Yes    Cardiovascular and hydration status stable: Yes    Post-Op pain: Adequate analgesia    Post-Op nausea and vomiting: none    Post-Op assessment: no apparent anesthetic complications    Complications: none    Attending Attestation: All indicated post anesthesia care provided    Author: Jamie Brookes, MD  as of: 09/21/2012  at: 4:48 PM

## 2012-09-21 NOTE — INTERIM OP NOTE (Signed)
Interim Op Note    Date of Surgery: 09/21/2012   Surgeon: Lanae Boast, MD   Co-Surgeon:   First Assistant: Kelly Splinter, MD   Second Assistant:     Pre-Op Diagnosis: left lateral epicondylitis    Anesthesia Type: General    Post-Op Diagnosis:    Primary: same  Secondary:   Tertiary:     Additional Findings (Including unexpected complications): none    Procedure(s) Performed (including CPT 4 Code if available)   left lateral epicondyle debridement and tendon repair    Estimated Blood Loss: min   Packing: No  Drains: No  Fluid Totals: Intakes: see anesthesia note Outputs: none  Specimens to Pathology: no  Patient Condition: good

## 2012-09-21 NOTE — Discharge Instructions (Addendum)
DISCHARGE INSTRUCTIONS  Elbow Surgery  Dr. Lanae Boast  Phone [phone]    [facility]  [address], [place] [address]      Diet: Advance diet as tolerated today.    Activities:  For the first 2-3 days rest and keep hand elevated with ice on the elbow.  Move as tolerated, your fingers, elbow, and shoulder so they do not get stiff.    Wound Care:  Keep and clean and dry and do not remove dressing.  If the bandage feels too tight you may unwrap the Ace bandage and reapply it more loosely.  Wear sling for comfort.    Special Instructions:  Continue to use ice for 1-2 weeks as needed to ease the pain.     Medications:  Advil 600 mg every 6-8 hours as needed if your stomach can tolerate it.  If additional pain medication is needed, alternate between the Advil and   Tylenol 650 or 1000mg  every 4 hours, OR  Percocet every 4 hours for more severe pain.      Only take pain medication if needed.  It can cause constipation and nausea.  Resume all of your usual medications.    Follow up:  Dr Elease Hashimoto would like you to see his hand therapist Meredith Mody, this Monday at the [facility], [facility] for dressing removal.  Call [phone] to make that appointment ( or will be made for you the day of surgery.)   You will also need a follow up appointment with Dr Elease Hashimoto in 3 weeks.    Call Dr Santiago Bur office [phone] or the appointment line [phone].  Due to the sedation medication and or general anesthesia you have been given today, please follow these discharge instructions:    []  Do not drive or operate any machinery for 24 hours or the specified time frame that was recommended by your doctor, please refer to post op instructions.  []  Do not drink any alcoholic beverages for 24 hours after your procedure and/or if you are taking a narcotic pain reliever (e.g. Vicodin, Percocet or Tylenol #3).  []  Do not make any major decisions or sign contracts for 24 hours.  []  Prescription information given to  patient and/or patient representative.    Diet: begin with liquids, advance as tolerated.    Your last pain medication was given to you at: none given    Your next dose of pain medication is due after: whenever needed for pain    If unable to reach your doctor at [phone], call 911 for a true emergency or go to your nearest emergency department.    It is our recommendation that you have someone stay with you for 24 hours following your procedure.

## 2012-09-21 NOTE — Op Note (Signed)
PATIENTQUADE, [name] MR #:  [id number]   ACCOUNT #:  [id number] DOB:  1965-11-01    AGE:  46     SURGEON:  Lanae Boast, MD  CO-SURGEON:    ASSISTANT:  Kelly Splinter, MD,RES  SURGERY DATE:  09/21/2012    PREOPERATIVE DIAGNOSIS:  Left lateral epicondylitis.    POSTOPERATIVE DIAGNOSIS:  Left lateral epicondylitis.    PROCEDURE:  An open left epicondyle debridement with extensor tendon repair.    ANESTHESIA:  General.    ESTIMATED BLOOD LOSS:  Minimal.    INDICATIONS:  This is a 47 year old male with a history of persistent chronic lateral epicondylitis.  After a period of conservative measures, the patient wished to discuss surgical options.  An MRI was obtained demonstrating a  partial tear in extensor tender over the lateral epicondyle.  I had a discussion of the risks, benefits and alternatives.  The patient wished to proceed with open debridement of the lateral epicondyle with extensor tendon repair.    DESCRIPTION OF PROCEDURE:  The patient was seen in preanesthesia by the attending surgeon. The surgical site was marked.  Consent was reviewed.  The patient was then seen by anesthesiology and then transported to the operating room.  The patient received Ancef for preoperative antibiosis.  A well-padded arm tourniquet was placed.  The patient was then prepped with ChloraPrep and draped in the usual sterile fashion.  The patient was then re-prepped with Betadine over the surgical incision site.  A surgical pause was completed prior to the procedure.       An Esmarch was used to exsanguinate the limb and the tourniquet was inflated to 250 mmHg.  A longitudinal incision over the anterior aspect of the lateral epicondyle was made with a #15 blade.  Dissection carried down to the extensor fascia.  This was incised in line with the skin incision down to the level of the ECRL and ECRB.  At the level of the ECRB,   there was fibrotic type tissue.  This was excised in full and the lateral epicondyle over the  anterior aspect was fully visualized.  Using a 15 blade as well as a bone elevator, the anterior surface of the lateral condyle was debrided to bleeding bone.  It was then fully irrigated with sterile normal saline.  The extensor fascia was then closed in interrupted fashion with 2-0 Vicryl sutures.  The subcutaneous layer was then closed with 2-0 Vicryl sutures in interrupted fashion.  The skin was closed with a running 3-0 Monocryl suture.  There was then cleaned and dried and then dressed with Steri-Strips and Xeroform.  A mixture of 4% Marcaine was then injected over the incision onto the lateral epicondyle.  A total 7 mL was used.  Fluffs and sterile Webril were applied.  The tourniquet was then released.  An Ace bandage from the hand to the upper arm was then applied and he was placed into a sling.  Anesthesia was then reversed.  The patient tolerated the procedure well.  He was then transported back to the postanesthesia care unit in good condition.    OPERATIVE PROCEDURE:         Dictated By:  Kelly Splinter, MD,RES      ______________________________  Lanae Boast, MD    JO/MODL  DD:  09/21/2012 16:13:48  DT:  09/21/2012 17:31:50  Job #:  [id number]    cc: Lanae Boast, MD   Lanae Boast, MD   [address]  [place], [place] [address]      I was present throughout the entire procedure.

## 2012-09-21 NOTE — Progress Notes (Signed)
Report given to RN Meg Quinter.

## 2012-09-21 NOTE — H&P (Signed)
UPDATES TO PATIENT'S CONDITION on the DAY OF SURGERY/PROCEDURE    I. Updates to Patient's Condition (to be completed by a provider privileged to complete a H&P, following reassessment of the patient by the provider):    Full H&P done today; no updates needed.            II. Procedure Readiness   I have reviewed the patient's H&P and updated condition. By completing and signing this form, I attest that this patient is ready for surgery/procedure.      III. Attestation   I have reviewed the updated information regarding the patient's condition and it is appropriate to proceed with the planned surgery/procedure.    Lanae Boast, MD as of 2:02 PM 09/21/2012

## 2012-09-22 MED FILL — Bupivacaine Inj 0.25% w/ Epinephrine 1:200000 (PF): INTRAMUSCULAR | Qty: 30 | Status: AC

## 2012-09-22 NOTE — Progress Notes (Signed)
[facility]  Peri-Operative Care Note      Patient is seen immediate post-op left elbow surgery - epicondyle debridement with extensor tendon repair- performed 09/21/12     Pain Assessment    Pain control measures reviewed, including appropriate use of ice and medications as prescribed. Patient is to call MD in the event of uncontrolled pain, nausea/vomiting or fever or with any additional concerns.    Immediate Post-Operative Exercises  Patient was instructed to perform exercises as follows per post-operative protocol:    hand/wrist  Gripping/finger movement, begin immediately  hand/wrist  Wrist Flex/Ext, begin as tolerated (PROM until able to perform AROM)   elbow  Flex/Ext PROM/AAROM, begin tomorrow as tolerated     If pt does not feel comfortable performing exercises, they should discontinue until first rehabilitation appointment.     Activity Counseling  Pt was advised not to carry anything in surgical hand at this time.       Goals:  1. Pt independent with joint protection concepts for ADLs  2. Pt independent with joint ROM/muscular exercises as instructed and appropriate    Patient/family provided contact information for additional questions/concerns/follow-up care. Questions were invited and answered to patient's satisfaction. Post operative rehabilitation was scheduled with [facility].     Donzetta Kohut MEd, ATC

## 2012-09-27 ENCOUNTER — Ambulatory Visit: Payer: Self-pay | Admitting: Rehabilitative and Restorative Service Providers"

## 2012-09-27 NOTE — Progress Notes (Signed)
Upper Extremity and Hand Rehabilitation  PT Initial Evaluation    History  Encounter Diagnosis   Name Primary?   . Lateral epicondylitis (tennis elbow), left Yes     Lanae Boast, MD  [address]  [place], [place] [address]    Onset date: several months ago  Date of Surgery: s/p release 09/21/12    Subjective    [name] is a 47 y.o. male who is present today for left, elbow pain.  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, Stage manager and Gardening/Yard Work    Symptom location: Lateral, left elbow pain  Relevant symptoms: Aching, Pain , Decreased ROM, Decreased strength  Symptom frequency: Intermittent  Symptom intensity (0 - 10 scale): Now 2 Best 2 Worst 4      Symptoms worsen with: elbow motion  Symptoms improve with: Rest, Immobilization  Assistive device:  Splint/brace  Patient's goals for therapy: Return to work, Increase ROM, Increase strength, Independent with home program     Objective:    Observation: Edema slight around  Scar:  Unremarkable    ROM/Strength  (unoted implies WFL or NA Manual Muscle Testing Scores_/5 )    UE AROM      left   Elbow    Flexion 90   Extension 5   Pronation WNL   Supination  WNL       Hand dominance:  right     Assessment:  Findings consistent with 47 y.o., male with   Encounter Diagnosis   Name Primary?   . Lateral epicondylitis (tennis elbow), left Yes     with pain, ROM limitations, strength limitations, functional limitations    Prognosis:  Good   Contraindications/Precautions/Limitation:  Per protocol and Per diagnosis  Short Term Goals:  Increase ROM left elbow, Increase strength left elbow (as appropriate), Minimal assistance with HEP/ education concepts and  fabriacate prper splinting  Long Term Goals:   Pain/Sx 0 - minimal, ROM/ flexibility WNL , Restoration of functional strength, Independent with HEP/education , Functional return to ADLs / activites without limitation      Treatment Plan:     Patient/family involved in developing goals and treatment plan: Yes  Expected length of care 6 week(s)  Freq 1 times per week for 6 weeks    Treatment plan inclusive of:   Exercise: AROM left elbow   Modalities:  Moist heat    Request additional:  8 visits for therapy.     Thank you for the referral of this patient to Upper Extremity and Hand Rehabilitation. Please do not hesitate to contact me if I may be of assistance.      Please submit written approval or fax [phone]    Meredith Mody MS,PT,CHT

## 2012-09-29 ENCOUNTER — Other Ambulatory Visit: Payer: Self-pay | Admitting: Primary Care

## 2012-10-11 ENCOUNTER — Ambulatory Visit: Payer: Self-pay | Admitting: Rehabilitative and Restorative Service Providers"

## 2012-10-11 DIAGNOSIS — M7712 Lateral epicondylitis, left elbow: Secondary | ICD-10-CM

## 2012-10-11 NOTE — Progress Notes (Signed)
Hand Rehab Daily Note    WOODROE DUET   [id number]   Encounter Diagnosis   Name Primary?   . Lateral epicondylitis (tennis elbow), left Yes     Subjective:  Pain Score: 2  Pain: Improved    Objective:  ROM - Improved, Left, Elbow, EXT 5, FLEX 115  Strength - NA  Function: - NA. Patient has been wearing a home exercise program.  Education:  Updated HEP    Treatment Modalities:  Moist heat, Phonophoresis with 10% HC  , elbow CPM flexion and extension, Bike    Assessment:  Patient responding favorably, Continue present therapy treatment    Plan of Care:  Continue per Plan of care -  As written    Meredith Mody MS,PT,CHT

## 2012-10-18 ENCOUNTER — Ambulatory Visit: Payer: Self-pay | Admitting: Rehabilitative and Restorative Service Providers"

## 2012-10-18 ENCOUNTER — Encounter: Payer: Self-pay | Admitting: Sports Medicine

## 2012-10-18 ENCOUNTER — Ambulatory Visit: Payer: Self-pay | Admitting: Sports Medicine

## 2012-10-18 VITALS — BP 123/71 | Ht 72.0 in | Wt 190.0 lb

## 2012-10-18 DIAGNOSIS — IMO0002 Reserved for concepts with insufficient information to code with codable children: Secondary | ICD-10-CM

## 2012-10-18 DIAGNOSIS — M7712 Lateral epicondylitis, left elbow: Secondary | ICD-10-CM

## 2012-10-18 MED ORDER — TRAMADOL HCL 50 MG PO TABS *I*
50.0000 mg | ORAL_TABLET | ORAL | Status: DC | PRN
Start: 2012-10-18 — End: 2012-12-15

## 2012-10-18 NOTE — Progress Notes (Signed)
PATIENTPLUTARCO, [name] MR #:  [id number]   ACCOUNT #:  [id number] DOB:  1966-03-28   DICTATED BY:  Purcell Nails, MD DATE OF VISIT:  10/18/2012     CHIEF COMPLAINT:  Followup status post left elbow surgery.    INTERVAL HISTORY:  [name] is a 47 year old gentleman who is status post open left lateral epicondyle debridement with Dr. Elease Hashimoto on September 21, 2012.  Since that point in time, he has been doing fairly well.  He reports that he still has some pain in the elbow that occasionally radiates down the forearm.  He has been using ibuprofen for this.  He has been doing well with physical therapy.  He wears his elbow brace consistently throughout the day and he comes out of it 3-4 times a day for range of motion exercises.  He reports no major issues.     Past medical history, medications, and allergies are reviewed in eRecord.    PHYSICAL EXAMINATION:  [name] is well appearing.  Examination of the left upper extremity demonstrates a well-healed surgical incision.  He has a range of motion with elbow flexion and extension from 0-130 degrees.  He has full pronation and supination.  He is neurovascularly intact distally.    ASSESSMENT/PLAN:  [name] is a 47 year old gentleman status post open debridement of left lateral epicondyle on September 21, 2012.  He is doing well after surgery.  We will wean him out of the splint.  In addition, he will be provided with a prescription for tramadol to further help control his pain.  We will see him back in the office in 10-12 weeks.     The patient was seen and examined with Dr. Elease Hashimoto.       Dictated By:  Purcell Nails, MD      ______________________________  Lanae Boast, MD    MS/MODL  DD:  10/18/2012 12:40:17  DT:  10/18/2012 15:39:16  Job #:  [id number]    cc: Lanae Boast, MD

## 2012-10-18 NOTE — Progress Notes (Signed)
This office note has been dictated.  [id number]

## 2012-10-18 NOTE — Progress Notes (Signed)
Hand Rehab Daily Note    [name]   [id number]   Encounter Diagnosis   Name Primary?   . Lateral epicondylitis (tennis elbow), left Yes     Subjective:  Pain Score: 2  Pain: Improved    Objective:  ROM - Improved, Left, Elbow, EXT 5, FLEX 115  Strength - NA  Function: - NA. Patient has been wearing a posterior long arm brace. Pre fab brace issued today.  Education:  Updated HEP. PRE's instructed today.    Wrist PRE's Wt/reps   Heat X   Extension 1/30   Flexion 1/30   Supination 1/30   Pronation 1/30   Radial Deviation 1/30   Ulnar Deviation 1/30   Fingers E/F 2oz/30   Bike 46min/3min   Phono X     Treatment Modalities:  Moist heat, Phonophoresis with 10% HC     Assessment:  Patient responding favorably, Continue present therapy treatment    Plan of Care:  Continue per Plan of care -  As written. Seen by MD today.    Meredith Mody MS,PT,CHT

## 2012-10-21 ENCOUNTER — Ambulatory Visit: Payer: Self-pay | Admitting: Rehabilitative and Restorative Service Providers"

## 2012-10-25 ENCOUNTER — Ambulatory Visit: Payer: Self-pay | Admitting: Primary Care

## 2012-10-28 ENCOUNTER — Ambulatory Visit: Payer: Self-pay | Admitting: Rehabilitative and Restorative Service Providers"

## 2012-10-28 DIAGNOSIS — M7712 Lateral epicondylitis, left elbow: Secondary | ICD-10-CM

## 2012-10-28 NOTE — Progress Notes (Signed)
Hand Rehab Daily Note    [name]   [id number]   Encounter Diagnosis   Name Primary?   . Lateral epicondylitis (tennis elbow), left Yes     Subjective:  Pain Score: 2  Pain: Improved overall. Pain slight around lateral epicondyle.    Objective:  ROM - Improved, Left, Elbow, EXT 5, FLEX 130  Strength - NA  Function: - NA. Patient has been wearing a pre fab splint.  Education:  Updated HEP. PRE's instructed today.    Wrist PRE's Wt/reps   Heat X   Extension 1/30   Flexion 1/30   Supination 1/30   Pronation 1/30   Radial Deviation 1/30   Ulnar Deviation 1/30   Fingers E/F 2oz/30   Bike 54min/3min   Phono X     Treatment Modalities:  Moist heat, Phonophoresis with 10% HC     Assessment:  Patient responding favorably, Continue present therapy treatment    Plan of Care:  Continue per Plan of care -  As written.     Meredith Mody MS,PT,CHT

## 2012-11-04 ENCOUNTER — Ambulatory Visit: Payer: Self-pay | Admitting: Rehabilitative and Restorative Service Providers"

## 2012-11-04 DIAGNOSIS — M7712 Lateral epicondylitis, left elbow: Secondary | ICD-10-CM

## 2012-11-04 NOTE — Progress Notes (Signed)
Hand Rehab Daily Note    [name]   [id number]   Encounter Diagnosis   Name Primary?   . Lateral epicondylitis (tennis elbow), left Yes     Subjective:  Pain Score: 2  Pain: Improved overall. Pain slight around lateral epicondyle.    Objective:  ROM - Improved, Left, Elbow, EXT 5, FLEX 130  Strength - NA  Function: - NA. Patient has been wearing a pre fab splint.  Education:  Updated HEP. PRE's instructed today.    Wrist PRE's Wt/reps   Heat X   Wrist E/F 2/30   Extension (outstretched) 1/30   Flexion 1/30   Supination 1/30   Pronation 1/30   Radial Deviation 1/30   Ulnar Deviation 1/30   Fingers E/F 2oz/30   Bike 36min/3min   Phono X     Treatment Modalities:  Moist heat, Phonophoresis with 10% HC     Assessment:  Patient responding favorably, Continue present therapy treatment    Plan of Care:  Continue per Plan of care -  As written.     Meredith Mody MS,PT,CHT

## 2012-11-11 ENCOUNTER — Ambulatory Visit: Payer: Self-pay | Admitting: Rehabilitative and Restorative Service Providers"

## 2012-11-11 DIAGNOSIS — M7712 Lateral epicondylitis, left elbow: Secondary | ICD-10-CM

## 2012-11-11 NOTE — Progress Notes (Signed)
Patient was hiking last week and slipped and fell causing his elbow to become sore around lateral epicondyle. Ionto patch issued today.

## 2012-11-17 ENCOUNTER — Ambulatory Visit: Payer: Self-pay | Admitting: Rehabilitative and Restorative Service Providers"

## 2012-11-17 DIAGNOSIS — M7712 Lateral epicondylitis, left elbow: Secondary | ICD-10-CM

## 2012-11-17 NOTE — Progress Notes (Signed)
Hand Rehab Daily Note    [name]   [id number]   Encounter Diagnosis   Name Primary?   . Lateral epicondylitis (tennis elbow), left Yes     Subjective:  Pain Score: 2  Pain: Improved overall. Pain slight around lateral epicondyle however has improved since last week.    Objective:  ROM - Improved, Left, Elbow, EXT 5, FLEX 130  Strength - Improved,  Per function, Per Ther Ex flowsheet, Per patient report  Function: - Improved. Patient has been wearing a pre fab splint for heavier activities.  Education:  Updated HEP. PRE's to be continued.    Wrist PRE's Wt/reps   Heat X   Wrist E/F 2/30   Extension (outstretched) 1/30   Flexion 2/30   Supination 1/30   Pronation 1/30   Radial Deviation 2/30   Ulnar Deviation 2/30   Fingers E/F 3oz/30   Bike 88min/3min   Phono X     Treatment Modalities:  Moist heat, Phonophoresis with 10% HC     Assessment:  Patient responding favorably, Continue present therapy treatment    Plan of Care:  Continue per Plan of care -  As written.     Meredith Mody MS,PT,CHT

## 2012-11-25 ENCOUNTER — Ambulatory Visit: Payer: Self-pay | Admitting: Rehabilitative and Restorative Service Providers"

## 2012-11-25 DIAGNOSIS — M7712 Lateral epicondylitis, left elbow: Secondary | ICD-10-CM

## 2012-11-25 NOTE — Progress Notes (Signed)
Hand Rehab Daily Note    [name]   [id number]   Encounter Diagnosis   Name Primary?   . Lateral epicondylitis (tennis elbow), left Yes     Subjective:  Pain Score: 2  Pain: Improved overall. Pain slight around lateral epicondyle however has improved since last week.    Objective:  ROM - Improved, Left, Elbow, EXT 5, FLEX 130  Strength - Improved,  Per function, Per Ther Ex flowsheet, Per patient report  Function: - Improved. Patient has been wearing a pre fab splint for heavier activities.  Education:  Updated HEP. PRE's to be continued.    Wrist PRE's Wt/reps   Heat X   Wrist E/F 3/35   Extension (outstretched) 2/35   Flexion 3/35   Supination 2/35   Pronation 2/35   Radial Deviation 3/35   Ulnar Deviation 3/35   Fingers E/F 3oz/30   Flexbar yellow   Weight well 2kg/10   Tricep 1.5/10   Push/pull 1.5/10   Bike 28min/3min   Phono X     Treatment Modalities:  Moist heat, Phonophoresis with 10% HC     Assessment:  Patient responding favorably, Continue present therapy treatment    Plan of Care:  Continue per Plan of care -  As written.     Meredith Mody MS,PT,CHT

## 2012-12-02 ENCOUNTER — Ambulatory Visit: Payer: Self-pay | Admitting: Rehabilitative and Restorative Service Providers"

## 2012-12-02 DIAGNOSIS — M7712 Lateral epicondylitis, left elbow: Secondary | ICD-10-CM

## 2012-12-02 NOTE — Progress Notes (Signed)
Patient was hanging a picture and it slipped and torqued forearm. Patient has had an increase in pain. Manuel massage and phonoporesis performed.

## 2012-12-03 ENCOUNTER — Telehealth: Payer: Self-pay | Admitting: Primary Care

## 2012-12-03 DIAGNOSIS — E7800 Pure hypercholesterolemia, unspecified: Secondary | ICD-10-CM

## 2012-12-03 NOTE — Telephone Encounter (Signed)
Pt is coming in for a follow up appointment for cholesterol, does he need blood work?

## 2012-12-03 NOTE — Telephone Encounter (Signed)
Yes.  I have therefore entered the orders.  He can complete the blood work at any time.  He will need to be fasting.

## 2012-12-06 ENCOUNTER — Ambulatory Visit: Payer: Self-pay | Admitting: Rehabilitative and Restorative Service Providers"

## 2012-12-06 DIAGNOSIS — M7712 Lateral epicondylitis, left elbow: Secondary | ICD-10-CM

## 2012-12-06 NOTE — Progress Notes (Signed)
Hand Rehab Daily Note    [name]   [id number]   Encounter Diagnosis   Name Primary?   . Lateral epicondylitis (tennis elbow), left Yes     Subjective:  Pain Score: 2  Pain: Improved overall. Pain slight around lateral epicondyle since lifting his camera all weekend.    Objective:  ROM - Improved, Left, Elbow, EXT 5, FLEX 130  Strength - Improved,  Per function, Per Ther Ex flowsheet, Per patient report  Function: - Improved. Patient has been wearing a pre fab splint for heavier activities.  Education:  Updated HEP. PRE's to be continued.    Wrist PRE's Wt/reps   Heat X   Wrist E/F 3/35   Extension (outstretched) 2/35   Flexion 3/35   Supination 2/35   Pronation 2/35   Radial Deviation 3/35   Ulnar Deviation 3/35   Fingers E/F 3oz/30   Flexbar yellow   Weight well 2kg/10   Tricep 1.5/10   Push/pull 1.5/10   Bike 42min/3min   Phono X     Treatment Modalities:  Moist heat, Phonophoresis with 10% HC     Assessment:  Patient responding favorably, Continue present therapy treatment    Plan of Care:  Continue per Plan of care -  As written.     Meredith Mody MS,PT,CHT

## 2012-12-09 ENCOUNTER — Ambulatory Visit
Admit: 2012-12-09 | Discharge: 2012-12-09 | Disposition: A | Discharge status: Home / Self Care | Payer: Self-pay | Source: Ambulatory Visit | Attending: Primary Care | Admitting: Primary Care

## 2012-12-09 DIAGNOSIS — E7800 Pure hypercholesterolemia, unspecified: Secondary | ICD-10-CM

## 2012-12-09 DIAGNOSIS — I10 Essential (primary) hypertension: Secondary | ICD-10-CM

## 2012-12-09 LAB — COMPREHENSIVE METABOLIC PANEL
ALT: 32 U/L (ref 0–50)
AST: 28 U/L (ref 0–50)
Albumin: 4.9 g/dL (ref 3.5–5.2)
Alk Phos: 77 U/L (ref 40–130)
Anion Gap: 8 (ref 7–16)
Bilirubin,Total: 0.2 mg/dL (ref 0.0–1.2)
CO2: 26 mmol/L (ref 20–28)
Calcium: 9.2 mg/dL (ref 8.6–10.2)
Chloride: 104 mmol/L (ref 96–108)
Creatinine: 0.84 mg/dL (ref 0.67–1.17)
GFR,Black: 120 *
GFR,Caucasian: 104 *
Glucose: 109 mg/dL — ABNORMAL HIGH (ref 60–99)
Lab: 15 mg/dL (ref 6–20)
Potassium: 4.3 mmol/L (ref 3.3–5.1)
Sodium: 138 mmol/L (ref 133–145)
Total Protein: 7.2 g/dL (ref 6.3–7.7)

## 2012-12-09 LAB — LIPID PANEL
Chol/HDL Ratio: 3.5
Cholesterol: 128 mg/dL
HDL: 37 mg/dL
LDL Calculated: 64 mg/dL
Non HDL Cholesterol: 91 mg/dL
Triglycerides: 134 mg/dL

## 2012-12-15 ENCOUNTER — Encounter: Payer: Self-pay | Admitting: Family Medicine

## 2012-12-15 ENCOUNTER — Ambulatory Visit: Payer: Self-pay | Admitting: Family Medicine

## 2012-12-15 VITALS — BP 120/80 | HR 72 | Resp 16 | Ht 71.0 in | Wt 193.0 lb

## 2012-12-15 DIAGNOSIS — I10 Essential (primary) hypertension: Secondary | ICD-10-CM

## 2012-12-15 DIAGNOSIS — I252 Old myocardial infarction: Secondary | ICD-10-CM

## 2012-12-15 DIAGNOSIS — E785 Hyperlipidemia, unspecified: Secondary | ICD-10-CM

## 2012-12-15 DIAGNOSIS — I2510 Atherosclerotic heart disease of native coronary artery without angina pectoris: Secondary | ICD-10-CM

## 2012-12-15 DIAGNOSIS — F411 Generalized anxiety disorder: Secondary | ICD-10-CM

## 2012-12-15 DIAGNOSIS — R079 Chest pain, unspecified: Secondary | ICD-10-CM

## 2012-12-15 NOTE — Progress Notes (Signed)
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 Ashok Croon cardiology has not been for awhile and is concerned will schedule apt after today    has increased anxiety since the heart attack and being on disability twice, no income and falling behind on bills   Considering temporary social services until he can get back to work    sleep is so so   Lives with wife and 3 boys , 2 at home  Age 47 and 4  Still has back pain ,     O.       Lab results: 12/09/12  0719   Cholesterol 128   HDL 37   LDL Calculated 64   Triglycerides 134   Chol/HDL Ratio 3.5       Chemistry        Lab results: 12/09/12  0719   Sodium 138   Potassium 4.3   Chloride 104   CO2 26   GFR,Caucasian 104   GFR,Black 120   UN 15   Creatinine 0.84        Lab results: 12/09/12  0719   Glucose 109*   Calcium 9.2   Total Protein 7.2   Albumin 4.9   ALT 32   AST 28   Alk Phos 77   Bilirubin,Total 0.2         Doing well with all his labs    lungs clear today   Heart reg ratae no murmur   weight up 3 pounds   Anxiety / depression obvious is currently on zoloft with OK results    A   1. Anxiety Disorder NOS    2. Myocardial infarction with symptoms after 8 weeks from infarction date    3. HTN (hypertension)    4. Hyperlipidemia    5. CAD (coronary atherosclerotic disease)    6. Chest pain     continued anxiety    Hypertension well controlled   lipidemia controlled and stable   Palpitations and chest pains recurrent     P. Strongly encouraged f/up with  Cardiology    discussed need to not get over heated   discussed work options  with  Work Loss adjuster, chartered   Pt may persue temporary disability    f/up with [place] in 4 months if seeing cardiollgy now  Did not wish further meds for anxiety at this time

## 2012-12-16 ENCOUNTER — Ambulatory Visit: Payer: Self-pay

## 2012-12-16 ENCOUNTER — Ambulatory Visit: Payer: Self-pay | Admitting: Rehabilitative and Restorative Service Providers"

## 2012-12-16 DIAGNOSIS — M7712 Lateral epicondylitis, left elbow: Secondary | ICD-10-CM

## 2012-12-16 NOTE — Progress Notes (Signed)
Patient has had an increase in left elbow for the past few weeks. Modalities have relieved pain however only temporarily. Patient was put in a posterior long arm cast for two weeks.

## 2012-12-16 NOTE — Progress Notes (Signed)
Verbal order from Lodema Pilot PA, apply long arm cast. Follow up in 2 weeks, ortho nurse sports, for cast removal and follow up with. Meredith Mody in hand therapy.

## 2012-12-16 NOTE — Progress Notes (Signed)
Cast application ordered by provider as dictated in note: Per nurse and [name] PA orders place patient into a long arm cast.    Skin appeared: unremarkable   Type of cast applied: Left long arm water proof cast  Approximate width and inches of stockinet: none  # of rolls of webril/aqua liner used: 1-2"  2-3" aqua liner  Approximate inches of blue tape used: 36"  # of fiberglass rolls (color): 1-2"  2-3" red  1-2" blue  1-1" white  Orthoglass used:  DME provided:      Patient tolerated procedure. Patient instructed on cast care and will follow-up as scheduled.

## 2012-12-16 NOTE — Patient Instructions (Signed)
Cast Care    About this topic   A cast is a stiff, solid dressing that is wrapped around your broken arm or leg. It supports and protects the bone while it heals after an injury or surgery.  A cast can be made of:   Plaster and is most often smooth and white   Fiberglass which is rough on the outside but may be colored  Before a cast is put on, a soft padding is placed over the injured area to protect the skin. The plaster or fiberglass is dipped in water and put over the padding to form a hard cast.  General   Almost all broken bones cause swelling and pain. A cast is used to hold a broken bone in place. It may lower the pain by helping you rest. It is not as easy to move around with a cast on. Torn tendons and ligaments and other injuries may heal faster if put in a cast. The most common breaks happen in an arm, leg, toe, or finger. How long you will wear your cast depends on the type of injury and how serious it is. You may need to wear it for 4 to 6 weeks. If the injury is very bad and takes a long time to heal, it may be needed from weeks to months.  It is important that you take good care of your cast. Here are some useful ways to take care of your cast and your injury.   Place an ice pack or a bag of frozen peas wrapped in a towel over the painful part. Never put ice right on the skin. Do not leave the ice on more than 10 to 15 minutes at a time.   Prop your leg or arm on pillows to help with swelling.   Do not trim or break off rough edges from your cast without checking with your doctor. You may be able to use a nail file to file small rough edges on your cast.   Do not pull out the padding inside your cast.   Always check for cracks or soft areas.   Never try to remove your own cast.  Bathing   Keep your cast clean and dry. Do not let your cast get wet. Cover it with two layers of plastic when you shower or bathe. You can also buy a waterproof shield for your cast.  Skin Care   Do not  scratch the skin under the cast with any sharp objects. Rub with your finger tips but not with your finger nails to help with itching.   Keep dirt, dust, or sand from getting inside of your cast.   Use a fan or hair dryer to keep your cast dry and to help with itching.   Do not put lotion or powder inside your cast. Powder may cake and lotion may soften the skin causing problems.  Activity   Do not walk on or put weight on your cast unless told by your doctor. For leg injuries, use crutches or a walker if the doctor orders it. For an arm injury, your doctor may give you a sling to make you more comfortable.   Do not lean or push your cast against hard objects.   Exercise any joints near your cast to avoid stiffness. Move or wiggle your toes or fingers often.   Massage the area above or below the cast a few times in a day.     What will   the results be?   Taking care of your cast may avoid serious problems.   The bone will heal best if you do not crack or break your cast.   Your skin will be protected from injury and infections.   There will be less pain and swelling.   It will help you heal more quickly.  What drugs may be needed?   The doctor may order drugs to help with pain, swelling, or itching.  Will there be any other care needed?    Do not remove the cast on your own. When it is time to remove your cast, your doctor will do it.   Your injured body part may look and feel a little different for a while. The skin may be pale or slightly discolored. You may feel weak and stiff.  What problems could happen?    The cast may crack or break.   The bone may not heal properly.   The bone may not heal in the right place.   The broken bone may take longer to heal.  When do I need to call the doctor?    Signs of infection. These include a fever of 100.4F (38C) or higher, chills, a wound that will not heal, or worse pain at the injured area.   Feeling more pain   Burning or stinging in your injured  part   Numbness, tingling, or pain in the area   Swollen, irritated, or painful arm or leg   Skin becomes red around the cast   Constant itching   Loss of feeling or less movement of your toes or fingers   Toes or fingers turn cold or blue   Trouble breathing   Cast cracks or gets soft   Cast feels too loose or too tight  Helpful tips   Here are some tips to care for your skin after a cast is removed.   Wash your skin gently with mild soap and water.   Do not scrub, rub, or scratch your skin.   Soak in warm water to remove dead skin.   Gently pat dry with soft clean towel.   Apply lotion that does not have perfume or fragrance to moisten skin and for faster healing.   Do not shave your skin for a few days.  Where can I learn more?   American Academy of Orthopaedic Surgeons  http://orthoinfo.aaos.org/topic.cfm?topic=a00095   FamilyDoctor.org  http://familydoctor.org/familydoctor/en/prevention-wellness/staying-healthy/first-aid/cast-care.html   Last Reviewed Date   2011-01-21  Disclaimer   This information is not specific medical advice and does not replace information you receive from your health care provider. This is only a brief summary of general information. It does NOT include all information about conditions, illnesses, injuries, tests, procedures, treatments, therapies, discharge instructions or life-style choices that may apply to you. You must talk with your health care provider for complete information about your health and treatment options. This information should not be used to decide whether or not to accept your health care provider's advice, instructions or recommendations. Only your health care provider has the knowledge and training to provide advice that is right for you.  Copyright   All content copyright  1978-2013 [employer] or its respective owners. All Rights Reserved.  Billing Information for Patients with Fractures    Your  Orthopaedics provider has determined that  you have a fracture.  He or she will be managing your ongoing treatment.  Below are some of the most frequently asked questions regarding the billing   processes for your care.  Depending on the anticipated office follow-up visits for your fracture, you may be charged a "global" fracture care charge rather than individual office visit charges.    What does the term "global" mean?  "Global" is the period (usually but not always 90 days) following a fracture allowed for fracture care.  During this time, ALL of your follow-up office visits for this condition are included in the initial charge.  However, X-rays and some supply costs (for casts/splints) are not included in the global fee.    I had a fracture and was placed in a cast.  Why does my Explanation of Benefits (EOB) sent by my insurance company say that I had surgery?  Each year the American Medical Association (AMA) publishes a book entitled "Current Procedural Terminology (CPT)."  This book provides 5-digit codes used by practically all doctors to describe medical procedures.  Every CPT code in the "Musculoskeletal System/Surgery" section of this book is classified as "surgery."  Fracture care is described either as closed treatment (no surgery needed) or open treatment (surgical treatment required).  This is why you may see the word "surgery" on your Explanation of Benefits (EOB) from your insurance company (even for closed treatment).    I had an X-ray and was put in a splint.  I was charged for fracture care.  Is this correct?  In most instances, initial treatment of a fracture involves splinting with later application of a cast (if needed) once the swelling subsides.  It is fortunate that you do not require surgery.  The treatment plan is based on the doctor's medical training and orthopaedic expertise to identify the fracture and assess its severity.  Anyone can look at an x-ray, but it takes a trained profession to interpret the image - to know whether it  will require surgery to fix or whether it can be treated without surgery.  The value of this medical training and expertise is not in the type of dressing, cast or splint applied but in the ability of the doctor to correctly assess the situation and render a medical judgement.    We hope this information has been helpful to you in understanding how your treatment is described and billed.  If you have additional billing questions, please contact our billing office at [phone].

## 2012-12-20 ENCOUNTER — Ambulatory Visit: Payer: Self-pay

## 2012-12-20 NOTE — Progress Notes (Unsigned)
Cast application ordered by provider as dictated in note: Per nurse orders patient remove cast. Put patient back into a Left long arm cast patient was in a water proof cast I suggest to put  patient  into a traditional cast he agree.     Skin appeared: normal  Type of cast applied: Left long arm fiberglass cast   Approximate width and inches of stockinet: 3"  # of rolls of webril/aqua liner used: 1-2" & 2-3" webril  Approximate inches of blue tape used: none  # of fiberglass rolls (color): 1-2" & 2-3" blue  Orthoglass used:  DME provided:      Patient tolerated procedure. Patient instructed on cast care and will follow-up as scheduled.

## 2012-12-20 NOTE — Progress Notes (Unsigned)
12/20/2012     Nurse's order: V/O per Raechel Ache  PA please change long arm cast to new long arm cast. Follow up in tow weeks from July 3rd for cast removal and a follow up with Meredith Mody in hand therapy    Reason for change: uncomfortable cast    Cast technician note: see Cast Tech III note    Cast technician involved: see Lyna Poser note cast tech III  Midlevel/Physician involved [employer] PA

## 2012-12-22 ENCOUNTER — Ambulatory Visit
Admit: 2012-12-22 | Discharge: 2012-12-22 | Disposition: A | Discharge status: Home / Self Care | Payer: Self-pay | Source: Ambulatory Visit | Attending: Cardiology | Admitting: Cardiology

## 2012-12-22 ENCOUNTER — Ambulatory Visit: Payer: Self-pay | Admitting: Cardiology

## 2012-12-22 ENCOUNTER — Other Ambulatory Visit: Payer: Self-pay | Admitting: Gastroenterology

## 2012-12-22 ENCOUNTER — Encounter: Payer: Self-pay | Admitting: Cardiology

## 2012-12-22 VITALS — BP 106/78 | HR 72 | Ht 71.0 in | Wt 193.0 lb

## 2012-12-22 DIAGNOSIS — I2510 Atherosclerotic heart disease of native coronary artery without angina pectoris: Secondary | ICD-10-CM

## 2012-12-22 DIAGNOSIS — I10 Essential (primary) hypertension: Secondary | ICD-10-CM

## 2012-12-22 DIAGNOSIS — R079 Chest pain, unspecified: Secondary | ICD-10-CM

## 2012-12-22 NOTE — Progress Notes (Signed)
Reason for visit: Chest pain    History of Present Illness:  We had the pleasure of seeing [name] today in our [facility].  He is a 47 y.o. with tobacco abuse and anxiety that sustained an anterior STEMI on 04/04/11.  He received thrombectomy and successful revascularization with a bare-metal stent to his mid left anterior descending artery.  Over the next year, [name] had multiple emergency department visits for atypical symptoms.  This prompted nuclear stress testing twice and a repeat angiogram that was unchanged from previous study.  He has been managed somewhat successfully with calcium channel blockers and nitrates.  Last month, [name] noted an increase in his chest discomfort.  They are centrally located and last for a few minutes in duration.  They resolve with rest and occasional 1-2 SL NG tablets.  They have not recurred in greater than 1 week.  The most severe episode occurred while tightening a truck cab bolt.  This was associated with profuse sweating and dyspnea.  Symptoms resolved with air conditioning, rest, and 2 SL NG.  He does not have a clear exertional trigger.  There is no correlation with activity, time of day, or position change.        Past Medical History   Diagnosis Date   . Prostatitis    . Anxiety    . Benign prostatic hypertrophy    . Depression    . Discogenic syndrome    . Nicotine dependence    . Coronary artery disease    . Cardiac arrest    . Palpitations      not currently   . HTN (hypertension) 04/10/2011   . Myocardial infarction 04/04/11     stemi @ [facility]   . STEMI (ST elevation myocardial infarction) 04/04/2011   . Back pain      lumbar   . Chest pain, unspecified      occurred 12/13     Past Surgical History   Procedure Laterality Date   . Ankle surgery     . Coronary angioplasty  04/04/11     with BMS to LAD   . Ankle surgery     . Cardiac catheterization  07/2011     mild nonobstructive disease-stent patent      . Coronary angioplasty with stent placement  04/04/11     BMS to LAD         Current Outpatient Prescriptions   Medication Sig   . sertraline (ZOLOFT) 100 MG tablet take 2 tablets by mouth once daily   . isosorbide mononitrate (IMDUR) 30 MG 24 hr tablet Take 1 tablet (30 mg total) by mouth daily   Swallow whole. Do not crush, break, or chew.   [name] amLODIPine (NORVASC) 5 MG tablet Take 1 tablet (5 mg total) by mouth daily   . omeprazole (PRILOSEC) 20 MG capsule take 1 capsule by mouth twice a day   . lisinopril (PRINIVIL,ZESTRIL) 5 MG tablet take 1 tablet by mouth once daily   . atorvastatin (LIPITOR) 40 MG tablet take 1 tablet by mouth once daily   . nitroglycerin (NITROSTAT) 0.4 MG SL tablet Place 1 tablet (0.4 mg total) under the tongue every 5 minutes as needed   May repeat 2 times then call 911 if pain persists.   . cyclobenzaprine (FLEXERIL) 10 MG tablet take 1 tablet by mouth three times a day if needed   . aspirin 81 MG EC tablet Take 1 tablet (81 mg total) by mouth  daily         Allergies   Allergen Reactions   . Latex      Hands peel   . Tylenol (Acetaminophen) Rash   . Percocet (Oxycodone-Acetaminophen) Hives       Family History   Problem Relation Age of Onset   . Diabetes Mother    . High cholesterol Mother    . Heart disease Father    . Diabetes Father        History     Social History   . Marital Status: Married     Spouse Name: N/A     Number of Children: N/A   . Years of Education: N/A     Social History Main Topics   . Smoking status: Former Smoker -- 0.80 packs/day for 24 years     Types: Cigarettes     Quit date: 04/04/2011   . Smokeless tobacco: Former Neurosurgeon   . Alcohol Use: No   . Drug Use: No   . Sexually Active: None     Other Topics Concern   . None     Social History Narrative   . None         Review of Systems:    General: No significant weight change, high stress level  Skin: No new lesions  Eyes: Glasses  Cardiovascular: HPI  Pulmonary: HPI  GI: + nausea  Heme: Easy bruisability   Endo: No  DM, thyroid normal  Musculoskeletal: Chronic back pains, recent left arm surgery  Neuro: No syncope    PHYSICAL EXAM:  BP 106/78  Pulse 72  Ht 1.803 m (5\' 11" )  Wt 87.544 kg (193 lb)  BMI 26.93 kg/m2  General: alert, full, NAD  HEENT: anicteric, MMM, no E/E OP, conj pink, no arcus senilis   Neck: no JVD, bruits, or LAD  CV: RRR, ns1/s2, no murmurs, rubs, or gallops  Pulm: CTA B  Abd: soft, NT, ND, +BS  Ext: no edema or cyanosis, 2+ distal pulses  Neuro: no gross focal deficits  Skin: ecchymosis on left forearm    Coronary Angiogram (04/04/11):  One vessel coronary artery disease (LAD). Mildly reduced left ventricular function. Successful percutaneous coronary intervention (bare metal stenting) of the left anterior descending artery. Succesful clot extraction form D2.     Coronary Angiogram (07/22/11):  Mild (non-obstructive) coronary artery disease. Normal LV systolic function.     SPECT (05/25/12):  PERFUSION: Myocardial perfusion SPECT imaging demonstrates: (1) Probably normal rest and stress myocardial pefusion, as noted; (2) No evidence for exercise stress-induced ischemia. (3) Compared to the prior study 04/11/2011, stress and rest myocardial perfusion images appear improved.   FUNCTION: ECG-gated SPECT myocardial wall motion study shows: (1) Normal biventricular size and performance. (2) Compared to the prior study 04/11/2011, mild regional dysfunction of the apex, peri-apex, and anterolateral wall appear resolved    EKG (12/22/12):  NSR at 72 BPM, normal axis/intervals, no AE, no VH, no pathologic QW or ST deviation, early tranistion      Assessment & Plan:   [name] is a 47 y.o. with prior anterior STEMI that was seen today for atypical chest pains.      1) Coronary artery disease and chest pains:  Intermittent sublingual nitroglycerin usage over the last few weeks with some resolution of symptoms.  This is not uncommon for Lear over the last year.  He has atypical anginal symptoms despite repeat  unremarkable angiogram and SPECT.  He could be having intermittent  coronary spasm, but I think this is more likely due to non-cardiac causes (notably stress).      - Continue aspirin, lisinopril 5 mg, amlodipine 5 mg, Imdur 30 mg, and atorvastatin 40 mg   - BB not tolerated previously   - SL NG as needed     2) Hypertension:  Systolic and diastolic blood pressure are at goal today on current regimen.    - Continue current medications   - Low sodium diet (<4 g/day)   - Electrolytes within normal range (12/09/12)    3) Hyperlipidemia: LDL and non-HDL below goal of <70 and <161 mg/dL on recent blood work (0/96/04).    - Continue current atorvastatin dosing    - Obtain fasting lipid panel annually        [name], MD  Cardiovascular Disease    Suggest follow-up as scheduled.  Thank you for allowing [place] to participate in this patient's care.  Please call or e-mail ([email or web address]) with any questions or concerns regarding his care.

## 2012-12-30 ENCOUNTER — Ambulatory Visit: Payer: Self-pay

## 2012-12-30 ENCOUNTER — Ambulatory Visit: Payer: Self-pay | Admitting: Rehabilitative and Restorative Service Providers"

## 2012-12-30 DIAGNOSIS — M7712 Lateral epicondylitis, left elbow: Secondary | ICD-10-CM

## 2012-12-30 NOTE — Progress Notes (Signed)
Hand Rehab Daily Note    [name]   [id number]   Encounter Diagnosis   Name Primary?   . Lateral epicondylitis (tennis elbow), left Yes     S/p epicondyle debridement with extensor tendon repair- performed 09/21/12   Note: Patient had a "flare up" around lateral epicondyle area (surgical site). Patient was placed in a posterior long arm cast for two weeks. Cast was removed yesterday.    Subjective:  Pain Score: 3  Pain: Worsened throughout lateral epicondyle. Patient complains of a "stiff feeling" throughout elbow    Objective:  ROM - Improved, Left, Elbow, EXT 20 FLEX 120  Strength - NA  Function: - NA. New posterior long arm splint fabricated today as patient threw out his previous one.  Education:  Updated HEP. Elbow ROM reviewed.    Wrist PRE's Wt/reps   Heat X   Elbow CPM extension 20 min   Bike 39min/3min   Phono X     Treatment Modalities:  Moist heat, Phonophoresis with 10% HC     Assessment:  Continue present therapy treatment    Plan of Care:  Continue per Plan of care -  As written. Patient to see MD tomorrow.    Meredith Mody MS,PT,CHT

## 2012-12-31 ENCOUNTER — Ambulatory Visit: Payer: Self-pay | Admitting: Sports Medicine

## 2012-12-31 ENCOUNTER — Encounter: Payer: Self-pay | Admitting: Sports Medicine

## 2012-12-31 VITALS — BP 130/80 | Ht 72.0 in | Wt 193.0 lb

## 2012-12-31 DIAGNOSIS — M7710 Lateral epicondylitis, unspecified elbow: Secondary | ICD-10-CM

## 2012-12-31 LAB — EKG 12-LEAD
P: 50 degrees
QRS: -4 degrees
Rate: 72 {beats}/min
Statement: ABNORMAL
T: 44 degrees

## 2012-12-31 NOTE — Progress Notes (Signed)
This office note has been dictated.  AVW#:[id number]

## 2012-12-31 NOTE — Progress Notes (Signed)
PATIENTACKEEM, BAILIN MR #:  [id number]   ACCOUNT #:  [id number] DOB:  01-05-1966   DICTATED BY:  Raechel Ache, PA DATE OF VISIT:  12/31/2012     CHIEF COMPLAINT:  Followup, left elbow.    INTERVAL HISTORY:  The patient is here status post a left open lateral epicondyle debridement on September 21, 2012.  He states that he was initially doing well.  However, he is unsure if this is related, he dropped an 8-pane glass window and caught it with his left hand.  He states since then he has had increased pain and tingling down into his hand.  He has been compliant with formal physical therapy.  He was placed in a cast for 2 weeks.  He was taken out of the cast and put back in his Orthoplast and continues with a significant amount of pain.  This happened about a month ago.    PHYSICAL EXAMINATION:  Shows that he is stiff with extension a few degrees shy of 0.  Flexion is appreciated just over 100 degrees.  He has well-preserved supination and pronation.  He is tender over the incision site.    ASSESSMENT AND PLAN:  Followup open lateral debridement of his lateral epicondyle completed April 8.  We will obtain an MRI to check the integrity of the repair and we wrote for [employer].  The patient will need 3 more weeks of disability and his paperwork will be sent to the office.  Pending what his MRI shows, we talked about the possibility of nerve conduction studies.       Dictated By:  Raechel Ache, PA      ______________________________  Lanae Boast, MD    ML/MODL  DD:  12/31/2012 08:27:40  DT:  12/31/2012 09:15:50  Job #:  [id number]    cc: Lanae Boast, MD

## 2013-01-03 NOTE — Progress Notes (Signed)
[name], [name] #:  [id number]   ACCOUNT #:  [id number] DOB:  Sep 13, 1965   DICTATED BY:  Lanae Boast, MD DATE OF VISIT:  12/31/2012     This is left elbow lateral epicondylitis follow up.  Again  he is status post the surgery.  Was doing well for a period time but in talking with the patient as well as with his therapist it sounds as if he was doing a fair amount of photography, lifting and about a month ago he was lifting a pane of glass, holding it and had an eccentric load as it was falling. This was a farm house window.  Prior to that he states it was feeling better. Question whether he has an  extensor musculature tear after his debridement takedown for surgery.  An MRI will be obtained.  Topical IPS compound was dispensed via fax.  He will follow up with me once the MRI has been completed to review and discuss treatment options.             ______________________________  Lanae Boast, MD    MM/MODL  DD:  01/03/2013 07:25:11  DT:  01/03/2013 08:02:51  Job #:  [id number]    cc: Lanae Boast, MD

## 2013-01-14 ENCOUNTER — Ambulatory Visit: Payer: Self-pay | Admitting: Sports Medicine

## 2013-01-14 ENCOUNTER — Other Ambulatory Visit: Payer: Self-pay | Admitting: Cardiology

## 2013-01-14 ENCOUNTER — Encounter: Payer: Self-pay | Admitting: Sports Medicine

## 2013-01-14 VITALS — BP 141/62 | HR 79 | Ht 72.0 in | Wt 193.2 lb

## 2013-01-14 DIAGNOSIS — I2510 Atherosclerotic heart disease of native coronary artery without angina pectoris: Secondary | ICD-10-CM

## 2013-01-14 DIAGNOSIS — I10 Essential (primary) hypertension: Secondary | ICD-10-CM

## 2013-01-14 DIAGNOSIS — M7710 Lateral epicondylitis, unspecified elbow: Secondary | ICD-10-CM

## 2013-01-14 MED ORDER — LISINOPRIL 5 MG PO TABS *I*
5.0000 mg | ORAL_TABLET | Freq: Every day | ORAL | Status: DC
Start: 2013-01-14 — End: 2014-01-25

## 2013-01-14 MED ORDER — ATORVASTATIN CALCIUM 40 MG PO TABS *I*
40.0000 mg | ORAL_TABLET | Freq: Every day | ORAL | Status: DC
Start: 2013-01-14 — End: 2014-01-25

## 2013-01-17 NOTE — Progress Notes (Signed)
PATIENTKABEL, [name] MR #:  [id number]   ACCOUNT #:  [id number] DOB:  [birth year]   DICTATED BY:  Lanae Boast, MD DATE OF VISIT:  01/14/2013     Left elbow followup MRI showing tearing of the extensor musculature at the previous site of repair.  Again, this was traumatically induced postoperatively.  We discussed his options.  He states presently that his pain is somewhat improved.  He just received his IPS compound and feels that it is helping as well.  He would like to try to avoid surgery and wants to give this the next 2-4 weeks to see how it responds.  Exam remains consistent.  Will let him contact us over the next 2-3 weeks to review his response to symptomatic measures and if he needs to have a revision, lateral extensor tendon surgery at the elbow.             ______________________________  Lanae Boast, MD    MM/MODL  DD:  01/17/2013 08:49:14  DT:  01/17/2013 09:34:18  Job #:  [id number]    cc: Lanae Boast, MD

## 2013-01-19 ENCOUNTER — Encounter: Payer: Self-pay | Admitting: Primary Care

## 2013-01-28 ENCOUNTER — Ambulatory Visit: Payer: Self-pay | Admitting: Primary Care

## 2013-02-02 ENCOUNTER — Encounter: Payer: Self-pay | Admitting: Primary Care

## 2013-02-02 ENCOUNTER — Ambulatory Visit: Payer: Self-pay | Admitting: Primary Care

## 2013-02-02 VITALS — BP 112/80 | HR 67 | Ht 72.0 in | Wt 197.0 lb

## 2013-02-02 DIAGNOSIS — M79602 Pain in left arm: Secondary | ICD-10-CM

## 2013-02-02 MED ORDER — CYCLOBENZAPRINE HCL 10 MG PO TABS *I*
10.0000 mg | ORAL_TABLET | Freq: Three times a day (TID) | ORAL | Status: DC | PRN
Start: 2013-02-02 — End: 2013-11-14

## 2013-02-09 ENCOUNTER — Other Ambulatory Visit: Payer: Self-pay | Admitting: Primary Care

## 2013-02-10 ENCOUNTER — Encounter: Payer: Self-pay | Admitting: Cardiology

## 2013-02-10 NOTE — Progress Notes (Signed)
[place] cardiology  -information sent including all cardiac testing and notes.    See media tab for further information.    Kyle Fuller, NP

## 2013-02-11 ENCOUNTER — Ambulatory Visit: Payer: Self-pay | Admitting: Sports Medicine

## 2013-02-11 ENCOUNTER — Encounter: Payer: Self-pay | Admitting: Sports Medicine

## 2013-02-11 VITALS — BP 128/70 | HR 76 | Ht 72.0 in | Wt 193.3 lb

## 2013-02-11 DIAGNOSIS — M25522 Pain in left elbow: Secondary | ICD-10-CM

## 2013-02-11 NOTE — Progress Notes (Signed)
This office note has been dictated.  UJW#:[id number]      Elbow  []  Right      [x]  Left        []  Arthroscopic  Extensive Debridement, 29562        []  UCL reconstruction        [x]  Debridement of the Common Extensor, Open        []  Loose Body Removal        []  Other:Revision            WC or MVA?        []  Yes        [x]  No        Need for further Work?        []  Yes; What is needed?        [x]  No

## 2013-02-11 NOTE — OR PreOp (Addendum)
[facility] Pre-operative Guidelines            Chart given to Felecia Jan to research and the to C.Smith NP for possible PRAT appointment for the following:  STEMI 2012. Had bare metal stent x1.  Dr.Pedulla cardiologist    Day of surgery arrival Time:  The surgery center will call you the business day before your procedure between 2 and 4:30pm.     Eating and drinking guidelines:  No solid foods after midnight the night before your procedure.  Adults-  May have clear liquids (water, soda or apple juice only) up to 4 hours prior to your arrival time and then nothing by mouth.  Pediatrics-  All but dental patients may have clear liquids (water, soda, or apple juice only) up to 3 hours before arrival time.   Dental patients are NPO after midnight.  Breast fed infants may have breast milk up to 4 hours before arrival.  Formula fed infants may have formula up to 6 hours before arrival.    Medications:  Medications to be taken on the day of surgery:  Amlodipine,isosorbide,prilosec    Items to bring on the day of surgery:  Insurance card, photo ID and healthcare proxy.  If you use a CPAP for sleep apnea, please bring your mask with you.  Your crutches, knee brace or arm sling. Please leave your crutches in the car.    Clothing, Jewelry, valuables and eyeglass case:  Wear loose fitting clothing that will fit over a bulky dressing.  Leave all jewelry and valuables at home.  Bring your eyeglasses and eyeglass case. You cannot wear contact lenses.     Pediatrics:  A parent or legal guardian must accompany and remain in the building at all times. We recommend that two adults accompany the patient home while riding in a car; one to drive the vehicle and one to assist with the care of the child.   Bring the child's favorite comfort item (i.e. blanket, doll or toy).    Surgeon Instructions:  Please read all pre-operative instructions carefully, and follow your surgeons guidelines if different from these  instructions.

## 2013-02-11 NOTE — Progress Notes (Signed)
PATIENTZADQUIEL, TEE MR #:  [id number]   ACCOUNT #:  [id number] DOB:  11/15/1965   DICTATED BY:  Raechel Ache, PA DATE OF VISIT:  02/11/2013     CHIEF COMPLAINT:  Followup left elbow.    INTERVAL HISTORY:  Patient had an open common extensor debridement then he suffered a trauma postoperatively.  An MRI showed tearing of the previous repair site.  He tried to complete conservative measurements.  However, failed and is here to discuss a revision surgery.  He is able to extend up to 0.  He appears ligamentously stable.  He is neurovascularly intact.    PLAN:  Dr. Elease Hashimoto did see and evaluate the patient.  He discussed going forth with a left elbow revision extensor tendon debridement.  Risks and benefits of surgery, recovery time lines were discussed.  He will contact Trish in the office for further scheduling.       Dictated By:  Raechel Ache, PA      ______________________________  Lanae Boast, MD    ML/MODL  DD:  02/11/2013 13:19:39  DT:  02/11/2013 13:36:02  Job #:  [id number]    cc: Lanae Boast, MD

## 2013-02-11 NOTE — Consults (Signed)
[name] 47 y.o..male  with Left elbow lateral epicondylitis who is scheduled for Left elbow debridement on 02/15/2013 with Dr. Elease Hashimoto.     The patient's chart was flagged for review due to h/o CAD, MI S/P LAD Bare metal stent on 03/2011. Cardiology Dr. Audery Amel last saw patient 12/22/2012. Patient states that his condition hasn't changed since his last visit. He intermittently will have angina 1-2x per week which resolves with Nitrostat. This is chronic and unchanged. He is active and walks with his wife 3 miles every night. As of note, 09/2012 he had left elbow surgery under [facility], LMA with IV induction with Fentanyl and Etomidate. Noted to have significant myoclonus with induction 2-13min.     Coronary Angiogram (07/22/11): Mild (non-obstructive) coronary artery disease. Normal LV systolic function.     SPECT (05/25/12):   PERFUSION: Myocardial perfusion SPECT imaging demonstrates: (1) Probably normal rest and stress myocardial pefusion, as noted; (2) No evidence for exercise stress-induced ischemia. (3) Compared to the prior study 04/11/2011, stress and rest myocardial perfusion images appear improved.   FUNCTION: ECG-gated SPECT myocardial wall motion study shows: (1) Normal biventricular size and performance. (2) Compared to the prior study 04/11/2011, mild regional dysfunction of the apex, peri-apex, and anterolateral wall appear resolved     EKG (12/22/12): NSR at 72 BPM, normal axis/intervals, no AE, no VH, no pathologic QW or ST deviation, early tranistion    Medical history is also notable for HTN, GERD well controlled on Omeprazole, Depression, Anxiety, TOB HX (Quit 03/2011, 19 pk yr hx), BPH.     The patient's chart was reviewed with Dr. Luster Landsberg. The patient will not require a pre-operative Anesthesia consult.     [name], DO 02/11/2013 3:17 PM

## 2013-02-15 ENCOUNTER — Other Ambulatory Visit: Payer: Self-pay | Admitting: Orthopedic Surgery

## 2013-02-15 ENCOUNTER — Ambulatory Visit
Admit: 2013-02-15 | Discharge: 2013-02-15 | Disposition: A | Discharge status: Home / Self Care | Payer: Self-pay | Source: Ambulatory Visit | Attending: Sports Medicine | Admitting: Sports Medicine

## 2013-02-15 MED ORDER — PROMETHAZINE HCL 25 MG/ML IJ SOLN *I*
6.2500 mg | INTRAMUSCULAR | Status: DC | PRN
Start: 2013-02-15 — End: 2013-02-16

## 2013-02-15 MED ORDER — ALBUTEROL SULFATE (2.5 MG/3ML) 0.083% IN NEBU *I*
2.5000 mg | INHALATION_SOLUTION | Freq: Once | RESPIRATORY_TRACT | Status: AC | PRN
Start: 2013-02-15 — End: 2013-02-15

## 2013-02-15 MED ORDER — HYDROMORPHONE HCL PF 1 MG/ML IJ SOLN *WRAPPED*
0.5000 mg | INTRAMUSCULAR | Status: DC | PRN
Start: 2013-02-15 — End: 2013-02-16

## 2013-02-15 MED ORDER — HALOPERIDOL LACTATE 5 MG/ML IJ SOLN *I*
1.0000 mg | Freq: Once | INTRAMUSCULAR | Status: AC | PRN
Start: 2013-02-15 — End: 2013-02-15

## 2013-02-15 MED ORDER — PROPOFOL 10 MG/ML IV EMUL (INTERMITTENT DOSING) WRAPPED *I*
INTRAVENOUS | Status: AC
Start: 2013-02-15 — End: 2013-02-15
  Filled 2013-02-15: qty 20

## 2013-02-15 MED ORDER — CEFAZOLIN SODIUM 1000 MG IJ SOLR *I*
INTRAMUSCULAR | Status: AC
Start: 2013-02-15 — End: 2013-02-15
  Filled 2013-02-15: qty 20

## 2013-02-15 MED ORDER — KETOROLAC TROMETHAMINE 30 MG/ML IJ SOLN *I*
INTRAMUSCULAR | Status: AC
Start: 2013-02-15 — End: 2013-02-15
  Filled 2013-02-15: qty 1

## 2013-02-15 MED ORDER — ONDANSETRON HCL 2 MG/ML IV SOLN *I*
INTRAMUSCULAR | Status: AC
Start: 2013-02-15 — End: 2013-02-15
  Filled 2013-02-15: qty 2

## 2013-02-15 MED ORDER — OXYCODONE HCL 5 MG/5ML PO SOLN *I*
5.0000 mg | Freq: Once | ORAL | Status: DC | PRN
Start: 2013-02-15 — End: 2013-02-16
  Administered 2013-02-15: 5 mg via ORAL

## 2013-02-15 MED ORDER — MIDAZOLAM HCL 1 MG/ML IJ SOLN *I* WRAPPED
INTRAMUSCULAR | Status: AC
Start: 2013-02-15 — End: 2013-02-15
  Filled 2013-02-15: qty 2

## 2013-02-15 MED ORDER — FENTANYL CITRATE 50 MCG/ML IJ SOLN *WRAPPED*
INTRAMUSCULAR | Status: AC
Start: 2013-02-15 — End: 2013-02-15
  Filled 2013-02-15: qty 2

## 2013-02-15 MED ORDER — LACTATED RINGERS IV SOLN *I*
20.0000 mL/h | INTRAVENOUS | Status: DC
Start: 2013-02-15 — End: 2013-02-16
  Administered 2013-02-15: 20 mL/h via INTRAVENOUS

## 2013-02-15 MED ORDER — OXYCODONE HCL 5 MG/5ML PO SOLN *I*
ORAL | Status: AC
Start: 2013-02-15 — End: 2013-02-15
  Filled 2013-02-15: qty 5

## 2013-02-15 MED ORDER — ONDANSETRON HCL 2 MG/ML IV SOLN *I*
1.0000 mg | Freq: Once | INTRAMUSCULAR | Status: AC | PRN
Start: 2013-02-15 — End: 2013-02-15

## 2013-02-15 MED ORDER — LIDOCAINE HCL 1 % IJ SOLN
INTRAMUSCULAR | Status: AC
Start: 2013-02-15 — End: 2013-02-15
  Filled 2013-02-15: qty 2

## 2013-02-15 MED ORDER — OXYCODONE-ACETAMINOPHEN 5-325 MG/5ML PO SOLN *A*
ORAL | Status: AC
Start: 2013-02-15 — End: 2013-02-15
  Filled 2013-02-15: qty 10

## 2013-02-15 MED ORDER — DEXTROSE IN LACTATED RINGERS 5 % IV SOLN *I*
100.0000 mL/h | INTRAVENOUS | Status: DC
Start: 2013-02-15 — End: 2013-02-16

## 2013-02-15 MED ORDER — LIDOCAINE HCL 2 % (PF) IJ SOLN *I*
INTRAMUSCULAR | Status: AC
Start: 2013-02-15 — End: 2013-02-15
  Filled 2013-02-15: qty 5

## 2013-02-15 MED ORDER — CEFAZOLIN SODIUM 1000 MG IJ SOLR *I*
2000.0000 mg | INTRAMUSCULAR | Status: DC
Start: 2013-02-15 — End: 2013-02-16

## 2013-02-15 MED ORDER — LIDOCAINE HCL 1 % IJ SOLN
0.1000 mL | Freq: Once | INTRAMUSCULAR | Status: DC | PRN
Start: 2013-02-15 — End: 2013-02-16
  Administered 2013-02-15: 0.1 mL via SUBCUTANEOUS

## 2013-02-15 MED ORDER — OXYCODONE HCL 5 MG PO TABS *I*
ORAL_TABLET | ORAL | Status: DC
Start: 2013-02-15 — End: 2013-03-16

## 2013-02-15 MED ORDER — DEXAMETHASONE SODIUM PHOSPHATE 4 MG/ML IJ SOLN
INTRAMUSCULAR | Status: AC
Start: 2013-02-15 — End: 2013-02-15
  Filled 2013-02-15: qty 1

## 2013-02-15 NOTE — Progress Notes (Signed)
PATIENTRAMONT, KINCHELOE MR #:  [id number]   ACCOUNT #:  [id number] DOB:  1965/07/10   DICTATED BY:  Lanae Boast, MD DATE OF VISIT:  02/11/2013     He states the topical gel and therapy efforts and time have not been helpful.  He would like to proceed with a repeat inspection of his extensor musculature and potential repeat repair of the extensor tendon as indicated.  I reviewed with him the concerns and need for some protection postoperatively.  He states he can be more consistent with protecting use of the arm.  Risks and benefits of a repeat left elbow surgery were discussed, questions invited and answered.  He would like to proceed.             ______________________________  Lanae Boast, MD    MM/MODL  DD:  02/15/2013 13:42:49  DT:  02/15/2013 14:44:28  Job #:  [id number]    cc: Lanae Boast, MD

## 2013-02-15 NOTE — INTERIM OP NOTE (Addendum)
Brief Op Note    Date of Surgery: 02/15/2013    Surgeon: Lanae Boast, MD    First Assistant: Lou Miner, MD    Second Assistant: none    Pre-Op Diagnosis: Re-tear of Left elbow lateral epicondyle common extensor repair    Anesthesia Type: General    Post-Op Diagnosis:  1.same    Additional Findings (Including unexpected complications): none    Procedure(s) Performed (including CPT 4 Code if available)   Open revision, debridement & repair of Left elbow lateral epicondyle/common extensor tear    Implants:  1.none    Fluids: 1400  Estimated Blood Loss: Unless otherwise indicated, EBL was minimal   UOP: Unless otherwise indicated, UOP was not measured  Packing: No  Drains: No    Tourniquet Time: 24 min    Specimens to Pathology: yes--CEO tissue sent to eval for angiofibrous dysplasia  Patient Condition: good      Patient was sent to PACU in stable condition.

## 2013-02-15 NOTE — Anesthesia Post-procedure Eval (Signed)
Anesthesia Post-op Note    Patient: [name]    Procedure(s) Performed: left elbow exploration    Anesthesia type: General    Patient location: PACU    Mental Status: Recovered to baseline    Patient able to participate in this evaluation: yes  Last Vitals: BP: 97/47 mmHg (02/15/13 1310)  Heart Rate: 69 (02/15/13 1310)  Temp: 36.3 C (97.3 F) (02/15/13 1227)  Resp: 16 (02/15/13 1310)  Height: 182.9 cm (6') (02/15/13 0904)  Weight: 88.315 kg (194 lb 11.2 oz) (02/15/13 0904)  BMI (Calculated): 26.5 (02/15/13 0904)  BSA (Calculated - sq m): 2.12 sq meters (02/15/13 0904)  SpO2: 95 % (02/15/13 1310)      Post-op vital signs noted above are within patient's normal range  Post-op vitals signs: stable  Respiratory function: baseline and stable    Airway patent: Yes    Cardiovascular and hydration status stable: Yes    Post-Op pain: Adequate analgesia    Post-Op nausea and vomiting: none    Post-Op assessment: no apparent anesthetic complications, tolerated procedure well and no evidence of recall  Comfortable, no chest pain or pressure -- eating and drinking.  Requesting discharge home.  Complications: none    Attending Attestation: All indicated post anesthesia care provided    Author: Devin Going, MD  as of: 02/15/2013  at: 1:16 PM

## 2013-02-15 NOTE — Anesthesia Pre-procedure Eval (Signed)
Anesthesia Pre-operative Evaluation for [name]  Per pre-screening note: "[name] 47 y.o..male with Left elbow lateral epicondylitis who is scheduled for Left elbow debridement on 02/15/2013 with Dr. Elease Hashimoto.   The patient's chart was flagged for review due to h/o CAD, MI S/P LAD Bare metal stent on 03/2011. Cardiology Dr. Audery Amel last saw patient 12/22/2012. Patient states that his condition hasn't changed since his last visit. He intermittently will have angina 1-2x per week which resolves with Nitrostat. This is chronic and unchanged. He is active and walks with his wife 3 miles every night. As of note, 09/2012 he had left elbow surgery under [facility], LMA with IV induction with Fentanyl and Etomidate. Noted to have significant myoclonus with induction 2-51min.   Coronary Angiogram (07/22/11): Mild (non-obstructive) coronary artery disease. Normal LV systolic function.     SPECT (05/25/12):   PERFUSION: Myocardial perfusion SPECT imaging demonstrates: (1) Probably normal rest and stress myocardial pefusion, as noted; (2) No evidence for exercise stress-induced ischemia. (3) Compared to the prior study 04/11/2011, stress and rest myocardial perfusion images appear improved.   FUNCTION: ECG-gated SPECT myocardial wall motion study shows: (1) Normal biventricular size and performance. (2) Compared to the prior study 04/11/2011, mild regional dysfunction of the apex, peri-apex, and anterolateral wall appear resolved     EKG (12/22/12): NSR at 72 BPM, normal axis/intervals, no AE, no VH, no pathologic QW or ST deviation, early tranistion   Medical history is also notable for HTN, GERD well controlled on Omeprazole, Depression, Anxiety, TOB HX (Quit 03/2011, 19 pk yr hx), BPH. "    Health History  Past Medical History   Diagnosis Date   . Prostatitis    . Anxiety    . Benign prostatic hypertrophy    . Depression    . Discogenic syndrome    . Nicotine dependence    . Coronary artery disease    . Palpitations      not  currently   . HTN (hypertension) 04/10/2011   . STEMI (ST elevation myocardial infarction) 04/04/2011     anterior    . Back pain      lumbar   . Chest pain, unspecified      occurred 12/13   . Postsurgical aortocoronary bypass status 10/12     x1 bare metal stent.cardiologist Dr.Pedulla   . Snoring    . GERD (gastroesophageal reflux disease)      Past Surgical History   Procedure Laterality Date   . Ankle surgery     . Cardiac catheterization  07/2011     mild nonobstructive disease-stent patent   . Coronary angioplasty with stent placement  04/04/11     BMS to LAD   . Elbow surgery Left 09/2012     Social History  History   Substance Use Topics   . Smoking status: Former Smoker -- 0.80 packs/day for 24 years     Types: Cigarettes     Quit date: 04/04/2011   . Smokeless tobacco: Never Used   . Alcohol Use: 0.0 oz/week     .5 drink(s) per week      Comment: rare      History   Drug Use No     ______________________________________________________________________  Allergies:   Allergies   Allergen Reactions   . Latex Other (See Comments)     Hands peel   . Tylenol [Acetaminophen] Itching and Rash   . Percocet [Oxycodone-Acetaminophen] Hives and Itching     Prior to  Admission Medications    Last Medication Reconciliation Action:  Up-To-Date Georgiana Spinner, RN 02/15/2013  9:02 AM              Last Dose Start Date End Date Provider     amLODIPine (NORVASC) 5 MG tablet 02/14/2013 at 0800  08/30/12  --  Prinzi, Dot Lanes, NP     Take 1 tablet (5 mg total) by mouth daily     aspirin 81 MG EC tablet 02/14/2013 at 0800  04/07/11  --  Shores, [place], [place]     Take 1 tablet (81 mg total) by mouth daily       atorvastatin (LIPITOR) 40 MG tablet 02/14/2013 at 2100  01/14/13  --  Shores, [place], PA     Take 1 tablet (40 mg total) by mouth daily     cyclobenzaprine (FLEXERIL) 10 MG tablet 02/14/2013 at 2100  02/02/13  --  Janeal Holmes, MD     Take 1 tablet (10 mg total) by mouth 3 times daily as needed     isosorbide mononitrate (IMDUR) 30  MG 24 hr tablet 02/14/2013 at 2100  09/08/12  --  Koleen Distance, NP     Take 1 tablet (30 mg total) by mouth daily   Swallow whole. Do not crush, break, or chew.     lisinopril (PRINIVIL,ZESTRIL) 5 MG tablet 02/14/2013 at 0900  01/14/13  --  Shores, [place], [place]     Take 1 tablet (5 mg total) by mouth daily     nitroglycerin (NITROSTAT) 0.4 MG SL tablet Past Week at Unknown time  06/21/12  --  [name], MD     Place 1 tablet (0.4 mg total) under the tongue every 5 minutes as needed   May repeat 2 times then call 911 if pain persists.     omeprazole (PRILOSEC) 20 MG capsule 02/15/2013 at 0500  02/09/13  --  Janeal Holmes, MD     take 1 capsule by mouth twice a day     sertraline (ZOLOFT) 100 MG tablet 02/14/2013 at 2100  09/29/12  --  Janeal Holmes, MD     take 2 tablets by mouth once daily        Current Outpatient Prescriptions   Medication Sig   . omeprazole (PRILOSEC) 20 MG capsule take 1 capsule by mouth twice a day   . cyclobenzaprine (FLEXERIL) 10 MG tablet Take 1 tablet (10 mg total) by mouth 3 times daily as needed   . lisinopril (PRINIVIL,ZESTRIL) 5 MG tablet Take 1 tablet (5 mg total) by mouth daily   . atorvastatin (LIPITOR) 40 MG tablet Take 1 tablet (40 mg total) by mouth daily   . sertraline (ZOLOFT) 100 MG tablet take 2 tablets by mouth once daily   . isosorbide mononitrate (IMDUR) 30 MG 24 hr tablet Take 1 tablet (30 mg total) by mouth daily   Swallow whole. Do not crush, break, or chew.   [name] amLODIPine (NORVASC) 5 MG tablet Take 1 tablet (5 mg total) by mouth daily   . nitroglycerin (NITROSTAT) 0.4 MG SL tablet Place 1 tablet (0.4 mg total) under the tongue every 5 minutes as needed   May repeat 2 times then call 911 if pain persists.   [name] aspirin 81 MG EC tablet Take 1 tablet (81 mg total) by mouth daily     . oxyCODONE (ROXICODONE) 5 MG immediate release tablet Take one - two tablets by mouth every 4 - 6 hours as needed for pain  MDD=8     Current Facility-Administered Medications   Medication   .  lidocaine 1 % injection 0.1 mL   . Lactated Ringers Infusion   . ceFAZolin (ANCEF) injection 2,000 mg     Admission Medications:  Scheduled Meds  . cefazolin IV     IV Meds  . lactated ringers 20 mL/hr (02/15/13 1032)   PRN Meds  . lidocaine 0.1 mL at 02/15/13 1031     Anesthesia Evaluation Information Source: per patient, per records  GENERAL  Pertinent (-):  history of anesthetic complications, FamHx of anesthetic complications, obesity    HEENT    + Corrective Eyewear            glasses  Pertinent (-):   glaucoma    PULMONARY    + Smoker            former    + Asthma (no longer uses inhaler)            exercise induced    + Snoring           loudly and observed apnea  Pertinent (-):  shortness of breath, recent URI, cough/congestion CARDIOVASCULAR  Good(4+METs) Exercise Tolerance    + Hypertension (amlodipine (yesterday morning); isosorbide mononitrate (last night); lisinopril (yesterday morning). BPs run 1teens/70s.  Stable dosing -no changes.)    + Past MI (MI 04/04/2011 - sxms: chest pain (right sided), sweating - shocked; BMS into LAD)    + Angina (sitting on the couch last week - had to take 2 nitro -- sees cardiologist and last visit in July.  Stable.)            at rest    + CAD    + Coronary intervention            bare metal stent    + Anticoagulants (yesterday morning)            ASA  Pertinent (-):  valvular disease, artificial valve    GI/HEPATIC/RENAL  Last PO Intake: >2hr before procedure (clears)      + GERD (took yesterday morning.  GERD is well controlled.  Currently asymtpomatic)  Pertinent (-):  liver  issues, renal issues  Comment: Drank lemon-lime soda at 5:30 NEURO/PSYCH    + Psychiatric Issues (on zoloft)          anxiety  Pertinent (-):  seizures, cerebrovascular event    ENDO/OTHER  Pertinent (-):  diabetes mellitus, thyroid disease    HEMATOLOGIC    + Anticoagulants    + Blood dyscrasia (under control)            hyperlipidemia  Pertinent (-):  bruises/bleeds easily, coagulopathy      Nursing Reported PO Status: Date Last PO Fluids: 02/15/13 0500 (4 oz black coffee)  Date Last PO Solids: 02/14/13 1700  ______________________________________________________________________  Physical Exam    Airway            Mouth opening: normal            Mallampati: II            TM distance (fb): >3 FB            Neck ROM: full  Dental   Normal Exam   Cardiovascular           Rhythm: regular           Rate: normal  No murmur     Pulmonary   pulmonary exam normal  breath sounds clear to auscultation    Mental Status   Normal  evaluation         Most Recent Vitals: BP: 125/70 mmHg (02/15/13 0904)  Heart Rate: 75 (02/15/13 0904)  Temp: 36.7 C (98.1 F) (02/15/13 0904)  Resp: 16 (02/15/13 0904)  Height: 182.9 cm (6') (02/15/13 0904)  Weight: 88.315 kg (194 lb 11.2 oz) (02/15/13 0904)  BMI (Calculated): 26.5 (02/15/13 0904)  BSA (Calculated - sq m): 2.12 sq meters (02/15/13 0904)  SpO2: 100 % (02/15/13 0904)    Vital Sign Ranges (last 24hrs)  Temp:  [36.7 C (98.1 F)] 36.7 C (98.1 F)  Heart Rate:  [75] 75  Resp:  [16] 16  BP: (125)/(70) 125/70 mmHg   O2 Device: None (Room air) (02/15/13 0904)    Most Recent Lab Results   Blood Type  No results found for this basename: aborh,  abs   CBC  Lab Results   Component Value Date    WBC 5.9 07/30/2011    HCT 40 07/30/2011    PLT 259 07/30/2011   Chem-7  Lab Results   Component Value Date    NA 138 12/09/2012    K 4.3 12/09/2012    CL 104 12/09/2012    CO2 26 12/09/2012    UN 15 12/09/2012    CREAT 0.84 12/09/2012    GLU 109* 12/09/2012   Estimated Creatinine Clearance: 134.32 ml/min (based on Cr of 0.84).  Electrolytes  Lab Results   Component Value Date    CA 9.2 12/09/2012    MG 1.7 04/10/2011    PO4 3.5 04/10/2011   Coags  Lab Results   Component Value Date    PTI 12.0 07/21/2011    INR 1.2 07/21/2011    PTT 31.0 07/21/2011   LFTs  Lab Results   Component Value Date    AST 28 12/09/2012    ALT 32 12/09/2012    ALK 77 12/09/2012     Bilirubin,Total   Date Value Range Status    12/09/2012 0.2  0.0 - 1.2 mg/dL Final     Pregnancy Status:   No LMP for male patient.    No results found for this basename: PUPT,  UPREG,  SPREG,  HCG1,  BHCG2,  BHCG,  HCGB     ECG Results  Lab Results   Component Value Date/Time    RATE 72 12/22/2012  4:03 PM    RATE 68 05/24/2012  1:39 PM    STATEMENT Sinus rhythm 12/22/2012  4:03 PM    STATEMENT Abnormal R-wave progression, early transition 12/22/2012  4:03 PM    STATEMENT SINUS RHYTHM 05/24/2012  1:39 PM    STATEMENT NONSPECIFIC INTRAVENTRICULAR CONDUCTION DELAY 05/24/2012  1:39 PM     ANES CPM    Radiology: Recent Study Impressions No results found.    ________________________________________________________________________  Medical Problems  Patient Active Problem List    Diagnosis Date Noted   . CAD (coronary atherosclerotic disease) 04/21/2011     Priority: High   . Chest pain 04/10/2011     Priority: High   . HTN (hypertension) 04/10/2011     Priority: High   . STEMI (ST elevation myocardial infarction) 04/04/2011     Priority: High   . Palpitations      Priority: Medium   . Hyperlipidemia 04/10/2011     Priority: Medium   . Anxiety Disorder NOS 03/06/2008     Priority: Medium     Created by Conversion       .  Depression 03/06/2008     Priority: Medium     Created by Conversion       . Lumbar degenerative disc disease 03/10/2011     Priority: Low   . Discogenic Syndrome 03/06/2008     Priority: Low     Created by Conversion  [facility] Annotation: Mar 06 2008  5:56PM - Gardiner Fanti: LUMBAR SPINE       . Benign Prostatic Hypertrophy 03/06/2008     Priority: Low     Created by Conversion       . Lateral epicondylitis  of elbow 09/13/2012   . Left elbow pain 08/27/2012       PreOp/PreProcedure Diagnosis (For more detail see procedural consent)            Left elbow pain  Planned Procedure (For more detail see procedural consent)            Left elbow debridement  Plan   ASA Score 3  Anesthetic Plan (general); Induction (routine IV); Airway (LMA); Line ( use current  access); Monitoring (standard ASA); Positioning (supine); Pain (per surgical team and postop nerve block if needed); PostOp (PACU)    Informed Consent     Risks:          Risks discussed were commensurate with the plan listed above with the following specific points:  N/V, aspiration, sore throat , damage to:(eyes, nerves, teeth), allergic Rx, unexpected serious injury    Anesthetic Consent:         Anesthetic plan and risks discussed with:  patient and spouse    Plan discussed with:  CRNA      Attending Attestation: The patient or proxy understand and accept the risks and benefits of the anesthesia plan. By accepting this note, I attest that I have personally performed the history and physical exam and prescribed the anesthetic plan within 48 hours prior to the anesthetic as documented by me above.    Author: Devin Going, MD

## 2013-02-15 NOTE — Progress Notes (Signed)
[facility]  Peri-Operative Care Note      Patient is seen immediate post-op left elbow surgery - debridement - performed 02/15/2013.      Pain Assessment  Pain control measures reviewed, including appropriate use of ice and medications as prescribed. Patient is to call MD in the event of uncontrolled pain, nausea/vomiting or fever or with any additional concerns.    Joint Protection and Education   elbow immobilized in  Splint/brace.   Joint protection concepts for ADLs, dressing, sleeping and self-care instructed pre-operatively; reviewed/confirmed post-operatively.     Immediate Post-Operative Exercises  Patient was instructed to perform exercises as follows per post-operative protocol:    hand/wrist  Gripping/Finger AROM, begin today      Activity Counseling  Patient instructed not to lift or carry anything in left hand at this time.  Patient instructed not to drive at this time.      Goals:  1. Pt independent with joint protection concepts for ADLs  2. Pt independent with joint ROM/muscular exercises as instructed and appropriate    Patient/family provided contact information for additional questions/concerns/follow-up care. Patient is scheduled for post operative rehabilitation with [facility] on 02/21/13.  All questions were invited and answered to the satisfaction of the patient and her husband.      Ebbie Ridge, ATC

## 2013-02-15 NOTE — Op Note (Signed)
[name], [name] MR #:  [id number]   ACCOUNT #:  [id number] DOB:  1965-06-25    AGE:  47     SURGEON:  Lanae Boast, MD  CO-SURGEON:    ASSISTANT:  Star Age, MD.  SURGERY DATE:  02/15/2013    PREOPERATIVE DIAGNOSIS:  Tear of left elbow lateral condyle and common extensor repair.    POSTOPERATIVE DIAGNOSIS:  Tear of left elbow lateral condyle and common extensor repair.    OPERATIVE PROCEDURE:  Open revision with debridement and repair of left elbow lateral epicondyle common extensor tear.    ANESTHESIA:  General with LMA.    INDICATIONS:  [name] is a 47 year old, right-hand-dominant gentleman who had previously undergone an open debridement of a left elbow lateral epicondyle that was refractory to nonoperative interventions.  He also had a small tear in the common extensor origin which was repaired at that time.  Postoperatively, he had a re-injury to the left elbow and recurrent lateral elbow pain.  An MRI obtained demonstrated a failure of the repair with a small tear in the common extensor origin.  Nonoperative measures were again tried and failed and he elected to proceed with a revision surgery.  All risks and benefits were discussed including risks of infection, bleeding, nerve injury, DVT, recurrent tear and pain.  Consent was signed.  He elected to proceed.    DESCRIPTION OF PROCEDURE:  The patient was met in the preoperative holding area.  Consent was again reviewed and he elected to proceed.  The left elbow was appropriately marked.  He was brought to the operating room and positioned supine on a hand table and administered general anesthetic with LMA.  A left arm tourniquet was placed.  Routine preoperative antibiotic prophylaxis was administered.  The left arm was then prepped and draped in standard sterile fashion.  A WHO time-out was performed in which the proper patient, surgical site and procedure were reviewed and confirmed by all.  Upon completion of the time-out an  Esmarch was used to exsanguinate the limb.  The tourniquet was brought up to 250 mmHg.  A lateral incision through his previous scar was made sharply through skin and subcutaneous tissue down to the level of the fascia overlying the common extensor origin using the interval between ECRL and ECRB.  Wide flaps were raised.  The fascia was then sharply incised with a knife in a curvilinear fashion with the apex posterior lateral.  The flap of tissue was raised again with sharp dissection down to bone.  The lateral epicondyle was exposed.  There was some moderate amount of scar tissue and this was resected sharply.  No evidence of a tear was identified.  The lateral epicondyle was debrided with rongeur and curette down to bleeding bone to facilitate healing.  The flap was then repaired with a 2-0 FiberWire in the apex of the flap.  The limbs were closed tightly with interrupted 2-0 Vicryl stitches.  The wound was then copiously irrigated.  The subcutaneous tissue was closed with 2-0 interrupted Vicryl stitches followed by a running 3-0 Monocryl subcuticular stitch.  Marcaine 0.25%, 10 cc, with epinephrine was infused locally.  A standard sterile dressing followed by the patient's prefabricated Orthoplast splint was applied.  The tourniquet was brought down for a total time of 24 minutes.  He was awoken from his anesthesia and brought to the PACU in stable condition.    ESTIMATED BLOOD LOSS:  Minimal.    FLUIDS:  1400  cc lactated Ringer's.    URINE OUPUT:  None.    DRAINS:  None.    PACKS:  None.    IMPLANTS:  None.    TOURNIQUET TIME:  24 minutes.    SPECIMENS:  A specimen was sent to Pathology to look for angio-fibrous dysplasia from the common extensor origin.  There were no cultures.       The patient remained in stable condition.       Dr. Elease Hashimoto was present and scrubbed for the entire duration of the case.       Dictated By:  Star Age [id number], MD      ______________________________  Lanae Boast,  MD    RV/MODL  DD:  02/15/2013 12:39:43  DT:  02/15/2013 13:37:49  Job #:  [id number]    cc:

## 2013-02-15 NOTE — Discharge Instructions (Signed)
DISCHARGE INSTRUCTIONS  Elbow Surgery  Dr. Lanae Boast  Phone [phone]    [facility]  [address] [place] [address]      Diet: Advance diet as tolerated today.    Activities:  For the first 2-3 days rest and keep hand elevated with ice on the elbow.  Move as tolerated, your fingers, elbow, and shoulder so they do not get stiff.    Wound Care:  Keep and clean and dry and do not remove dressing.  If the bandage feels too tight you may unwrap the Ace bandage and reapply it more loosely.  Wear sling for comfort.    Special Instructions:  Continue to use ice for 1-2 weeks as needed to ease the pain.     Medications:  Advil 600 mg every 6-8 hours as needed if your stomach can tolerate it.    Oxycodone every 4 hours for more severe pain.      Only take pain medication if needed.  It can cause constipation and nausea.  Resume all of your usual medications.    Follow up:  Dr Elease Hashimoto would like you to see his hand therapist Meredith Mody, this Monday at the [facility], [address] for dressing removal.  Call [phone] to make that appointment.    You will also need a follow up appointment with Dr Elease Hashimoto in 3 weeks.    Call Dr Santiago Bur office [phone] or the appointment line [phone].    Due to the sedation medication and or general anesthesia you have been given today, please follow these discharge instructions:    [x]  Do not drive or operate any machinery for 24 hours or the specified time frame that was recommended by your doctor, please refer to post op instructions.  [x]  Do not drink any alcoholic beverages for 24 hours after your procedure and/or if you are taking a narcotic pain reliever (e.g. Vicodin, Percocet or Tylenol #3).  [x]  Do not make any major decisions or sign contracts for 24 hours.  [x]  Prescription information given to patient and/or patient representative.    Diet: begin with liquids, advance as tolerated.    Your last pain medication was given to you at:     Your next  dose of pain medication is due after: 5:00 pm     If unable to reach your doctor at [phone], call 911 for a true emergency or go to your nearest emergency department.    It is our recommendation that you have someone stay with you for 24 hours following your procedure.

## 2013-02-15 NOTE — H&P (Signed)
UPDATES TO PATIENT'S CONDITION on the DAY OF SURGERY/PROCEDURE    I. Updates to Patient's Condition (to be completed by a provider privileged to complete a H&P, following reassessment of the patient by the provider):    Full H&P done today; no updates needed.      left elbow extensor tendon repair revision      II. Procedure Readiness   I have reviewed the patient's H&P and updated condition. By completing and signing this form, I attest that this patient is ready for surgery/procedure.      III. Attestation   I have reviewed the updated information regarding the patient's condition and it is appropriate to proceed with the planned surgery/procedure.    Lanae Boast, MD as of 10:47 AM 02/15/2013

## 2013-02-16 ENCOUNTER — Telehealth: Payer: Self-pay

## 2013-02-16 LAB — SURGICAL PATHOLOGY

## 2013-02-16 MED FILL — Bupivacaine Inj 0.25% w/ Epinephrine 1:200000 (PF): INTRAMUSCULAR | Qty: 30 | Status: AC

## 2013-02-16 NOTE — Telephone Encounter (Signed)
[name] requested to speak to you as he was awake a lot last night, itchy, and feels it may be a reaction to his pain medication.   He was wondering what to do and what else he can take or if you can prescribe something different?

## 2013-02-16 NOTE — Telephone Encounter (Signed)
He is allergic to Tyelnol (Itchy).  So I wrote for oxycodone plain yesterday after surgery.  He is calling stating that he is Itchy with plain oxy.  He will try benadryl and alternating with 600 - 800 mg ibuprofen.  He is also Itchy with Vicodin.  Did discuss potential for limited supply of Dilaudid if needed.  He will call back if he needs this.  We discussed that this is atypical for surgery.  He understands and will try our plan first.  KF

## 2013-02-21 ENCOUNTER — Ambulatory Visit: Payer: Self-pay | Admitting: Rehabilitative and Restorative Service Providers"

## 2013-02-21 DIAGNOSIS — M7712 Lateral epicondylitis, left elbow: Secondary | ICD-10-CM

## 2013-02-21 NOTE — Progress Notes (Signed)
[facility]  PT Initial Evaluation    History  Encounter Diagnosis   Name Primary?   . Lateral epicondylitis (tennis elbow), left Yes     Lanae Boast, MD  [address]  [place], [place] [address]    Onset date: several months ago  Date of Surgery: s/p release 09/21/12  S/p debridement 02/15/13    Subjective    [name] is a 47 y.o. male who is present today for left, elbow pain.  Mechanism of injury/history of symptoms: No specific cause. Patient re injured arm and MD performed a debridement.    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 Patient has been wearing an orthoplast posterior long arm brace.  Patient's goals for therapy: Return to work, Increase ROM, Increase strength, Independent with home program     Objective:    Observation: Edema slight around  Scar:  Unremarkable    ROM/Strength  (unoted implies WFL or NA Manual Muscle Testing Scores_/5 )    UE AROM      left   Elbow    Flexion 120   Extension 20   Pronation WNL   Supination  WNL       Hand dominance:  right     Assessment:  Findings consistent with 47 y.o., male with   Encounter Diagnosis   Name Primary?   . Lateral epicondylitis (tennis elbow), left Yes     with pain, ROM limitations, strength limitations, functional limitations    Prognosis:  Good   Contraindications/Precautions/Limitation:  Per protocol and Per diagnosis  Short Term Goals:  Increase ROM left elbow,  Increase strength left elbow (as appropriate), Minimal assistance with HEP/ education concepts and  fabriacate prper splinting  Long Term Goals:  Pain/Sx 0 - minimal, ROM/ flexibility WNL , Restoration of functional strength, Independent with HEP/education , Functional return to ADLs / activites without limitation      Treatment Plan:     Patient/family involved in developing goals and treatment plan: Yes  Expected length of care 6 week(s)  Freq 1 times per week for 6 weeks    Treatment plan inclusive of:   Exercise: AROM left elbow. Suture line pulled today.   Modalities:  Moist heat    Request additional:  8 visits for therapy.     Thank you for the referral of this patient to [facility] and [facility]. Please do not hesitate to contact me if I may be of assistance.      Please submit written approval or fax [phone]    Meredith Mody MS,PT,CHT

## 2013-02-27 NOTE — Progress Notes (Signed)
S:  [name] returns to the office for follow up of his left arm pain.    He reports that he has been addressing the pain around his left elbow with orthopedic surgeon, Dr. Elease Hashimoto.  He is scheduled to follow up with him on 02/11/13.  He was told that he has a tear of the extensor tendon that attaches to the lateral epicondyle.  The plan is for a repeat surgery to help stabilize this.  Patient notes that he is instructed to rest his left arm and hand until otherwise advised.  He is not to grasp, pull or lift until the problem has been corrected.    He reports continuous pain on the left elbow and forearm which he rates as a 5/10 in severity.  He reports dullness and tingling along the ulnar surface of his left forearm, as well as the 4th and 5th fingers of his left hand.  He continues to contend with his chronic low back pain as well.    He is currently taking 600 mg of ibuprofen 2-3 times each day.  He is taking 10 mg of cyclobenzaprine twice daily.  He otherwise continues with his usual medications.    O:  General: Alert, appropriate, pleasant man in no apparent distress.  Neck: Supple, no lymphadenopathy, no thyromegaly, 2+ carotid pulses bilaterally.  Heart: Regular rate and rhythm, no murmur.  Lungs: Clear to auscultation bilaterally.  Abdomen: Positive bowel sounds, soft, nontender, nondistended, no masses, no organomegaly.  Extremities: 2+ radial and 2+ posterior tibial pulses bilaterally.  No clubbing, cyanosis or edema.  MSK: Moderate tenderness surrounding the left lateral epicondyle.  There is a palpable defect of the extensor tendon of the left forearm.  No focal tenderness is identified elsewhere around the left elbow.  Neuro: Patient reports dull sensation along the ulnar surface of the left forearm as well as the 4th and 5th digits of the left hand.  No sensory or motor deficits are identified on the upper extremities otherwise.  Brisk, uniform biceps and brachioradialis tendon reflexes  bilaterally.  5/5 muscle strength in the flexors and extensors of the shoulders, elbows, wrists and hands bilaterally.    A:  Left arm pain    P:  1.  Patient is deemed to be totally temporarily disabled as a result of this problem.  He plans to submit disability application to the Department of Social Services.  2.  Continue with current medications at their current doses.  3.  Continue to avoid any lifting with the left upper extremity as well as any strenuous use.  4.  Proceed with plans for surgery as discussed.  5.  RTO in 2 months as already scheduled or sooner if any other problems or concerns.

## 2013-02-28 ENCOUNTER — Ambulatory Visit: Payer: Self-pay | Admitting: Rehabilitative and Restorative Service Providers"

## 2013-03-02 ENCOUNTER — Ambulatory Visit: Payer: Self-pay | Admitting: Rehabilitative and Restorative Service Providers"

## 2013-03-02 DIAGNOSIS — M7712 Lateral epicondylitis, left elbow: Secondary | ICD-10-CM

## 2013-03-02 NOTE — Progress Notes (Signed)
Hand Rehab Daily Note    [name]   [id number]   Encounter Diagnosis   Name Primary?   . Lateral epicondylitis (tennis elbow), left Yes     Onset date: several months ago  Date of Surgery: s/p release 09/21/12  S/p debridement 02/15/13    Subjective:  Pain Score: 2  Pain: Improved overall.     Objective:  ROM - Improved, Left, Elbow, EXT 5, FLEX 130  Strength - Improved,  Per function, Per Ther Ex flowsheet, Per patient report  Function: - Improved. Patient has been wearing a pre fab splint for heavier activities and a posterior long arm brace  Education:  Updated HEP. PRE's to be continued.    Wrist PRE's Wt/reps   Heat X   Wrist E/F 0/35   Extension (outstretched) 0/35   Flexion 0/35   Supination 0/35   Pronation 0/35   Radial Deviation 0/35   Ulnar Deviation 0/35   Fingers E/F 1oz/30   Flexbar yellow   Weight well 1kg/10   Bike 63min/3min   Phono X     Treatment Modalities:  Moist heat, Phonophoresis with 10% HC     Assessment:  Patient responding favorably, Continue present therapy treatment    Plan of Care:  Continue per Plan of care -  As written.     Meredith Mody MS,PT,CHT

## 2013-03-10 ENCOUNTER — Ambulatory Visit: Payer: Self-pay | Admitting: Rehabilitative and Restorative Service Providers"

## 2013-03-13 ENCOUNTER — Other Ambulatory Visit: Payer: Self-pay | Admitting: Gastroenterology

## 2013-03-13 ENCOUNTER — Observation Stay
Admission: EM | Admit: 2013-03-13 | Disposition: A | Discharge status: Home / Self Care | Payer: Self-pay | Source: Ambulatory Visit | Attending: Geriatric Medicine | Admitting: Geriatric Medicine

## 2013-03-13 ENCOUNTER — Encounter: Payer: Self-pay | Admitting: Student in an Organized Health Care Education/Training Program

## 2013-03-13 LAB — CBC AND DIFFERENTIAL
Baso # K/uL: 0 10*3/uL (ref 0.0–0.1)
Basophil %: 0.2 % (ref 0.2–1.2)
Eos # K/uL: 0.1 10*3/uL (ref 0.0–0.5)
Eosinophil %: 1.5 % (ref 0.8–7.0)
Hematocrit: 43 % (ref 40–51)
Hemoglobin: 14.7 g/dL (ref 13.7–17.5)
Lymph # K/uL: 1.7 10*3/uL (ref 1.3–3.6)
Lymphocyte %: 29.3 % (ref 21.8–53.1)
MCV: 93 fL — ABNORMAL HIGH (ref 79–92)
Mono # K/uL: 0.5 10*3/uL (ref 0.3–0.8)
Monocyte %: 9.1 % (ref 5.3–12.2)
Neut # K/uL: 3.5 10*3/uL (ref 1.8–5.4)
Platelets: 306 10*3/uL (ref 150–330)
RBC: 4.6 MIL/uL (ref 4.6–6.1)
RDW: 13.2 % (ref 11.6–14.4)
Seg Neut %: 59.9 % (ref 34.0–67.9)
WBC: 5.8 10*3/uL (ref 4.2–9.1)

## 2013-03-13 LAB — PLASMA PROF 7 (ED ONLY)
Anion Gap,PL: 12 (ref 7–16)
CO2,Plasma: 24 mmol/L (ref 20–28)
Chloride,Plasma: 101 mmol/L (ref 96–108)
Creatinine: 0.7 mg/dL (ref 0.67–1.17)
GFR,Black: 129 *
GFR,Caucasian: 111 *
Glucose,Plasma: 103 mg/dL — ABNORMAL HIGH (ref 60–99)
Potassium,Plasma: 4.2 mmol/L (ref 3.4–4.7)
Sodium,Plasma: 137 mmol/L (ref 132–146)
UN,Plasma: 15 mg/dL (ref 6–20)

## 2013-03-13 LAB — CK ISOENZYMES
CK: 147 U/L (ref 46–171)
Mass CKMB: 1.8 ng/mL (ref 0.0–4.9)

## 2013-03-13 LAB — TROPONIN T
Troponin T: <0.01 ng/mL (ref 0.00–0.02)
Troponin T: <0.01 ng/mL (ref 0.00–0.02)
Troponin T: <0.01 ng/mL (ref 0.00–0.02)

## 2013-03-13 LAB — MAGNESIUM: Magnesium: 1.7 mEq/L (ref 1.3–2.1)

## 2013-03-13 LAB — PHOSPHORUS: Phosphorus: 3.2 mg/dL (ref 2.7–4.5)

## 2013-03-13 LAB — HOLD BLUE

## 2013-03-13 MED ORDER — LISINOPRIL 10 MG PO TABS *I*
5.0000 mg | ORAL_TABLET | Freq: Every day | ORAL | Status: DC
Start: 2013-03-13 — End: 2013-03-14
  Administered 2013-03-14: 5 mg via ORAL
  Filled 2013-03-13: qty 1

## 2013-03-13 MED ORDER — NITROGLYCERIN 0.4 MG SL SUBL *I*
0.4000 mg | SUBLINGUAL_TABLET | SUBLINGUAL | Status: DC | PRN
Start: 2013-03-13 — End: 2013-03-14

## 2013-03-13 MED ORDER — CYCLOBENZAPRINE HCL 10 MG PO TABS *I*
10.0000 mg | ORAL_TABLET | Freq: Every evening | ORAL | Status: DC
Start: 2013-03-13 — End: 2013-03-14
  Administered 2013-03-14: 10 mg via ORAL
  Filled 2013-03-13: qty 1

## 2013-03-13 MED ORDER — METOPROLOL TARTRATE 12.5 MG PO CAPS *I*
12.5000 mg | ORAL_CAPSULE | Freq: Two times a day (BID) | ORAL | Status: DC
Start: 2013-03-13 — End: 2013-03-14
  Administered 2013-03-14 (×2): 12.5 mg via ORAL
  Filled 2013-03-13 (×2): qty 1

## 2013-03-13 MED ORDER — ISOSORBIDE MONONITRATE CR 30 MG PO TB24 *I*
30.0000 mg | ORAL_TABLET | Freq: Every day | ORAL | Status: DC
Start: 2013-03-13 — End: 2013-03-14
  Administered 2013-03-13: 30 mg via ORAL
  Filled 2013-03-13 (×2): qty 1

## 2013-03-13 MED ORDER — NITROGLYCERIN 0.4 MG SL SUBL *I*
0.4000 mg | SUBLINGUAL_TABLET | SUBLINGUAL | Status: DC | PRN
Start: 2013-03-13 — End: 2013-03-13
  Administered 2013-03-13 (×2): 0.4 mg via SUBLINGUAL
  Filled 2013-03-13: qty 1

## 2013-03-13 MED ORDER — IBUPROFEN 400 MG PO TABS *I*
800.0000 mg | ORAL_TABLET | Freq: Once | ORAL | Status: AC
Start: 2013-03-13 — End: 2013-03-13
  Administered 2013-03-13: 800 mg via ORAL
  Filled 2013-03-13: qty 2

## 2013-03-13 MED ORDER — ATORVASTATIN CALCIUM 40 MG PO TABS *I*
40.0000 mg | ORAL_TABLET | Freq: Every day | ORAL | Status: DC
Start: 2013-03-13 — End: 2013-03-14
  Administered 2013-03-13: 40 mg via ORAL
  Filled 2013-03-13 (×2): qty 1

## 2013-03-13 MED ORDER — ASPIRIN 81 MG PO CHEW *I*
324.0000 mg | CHEWABLE_TABLET | Freq: Once | ORAL | Status: AC
Start: 2013-03-13 — End: 2013-03-14
  Filled 2013-03-13: qty 4

## 2013-03-13 MED ORDER — SERTRALINE HCL 50 MG PO TABS *I*
200.0000 mg | ORAL_TABLET | Freq: Every day | ORAL | Status: DC
Start: 2013-03-13 — End: 2013-03-14
  Administered 2013-03-13: 200 mg via ORAL
  Filled 2013-03-13 (×2): qty 4

## 2013-03-13 MED ORDER — ASPIRIN 81 MG PO CHEW *I*
243.0000 mg | CHEWABLE_TABLET | Freq: Once | ORAL | Status: AC
Start: 2013-03-13 — End: 2013-03-13
  Administered 2013-03-13: 243 mg via ORAL
  Filled 2013-03-13: qty 3

## 2013-03-13 MED ORDER — OMEPRAZOLE 20 MG PO CPDR *I*
20.0000 mg | DELAYED_RELEASE_CAPSULE | Freq: Two times a day (BID) | ORAL | Status: DC
Start: 2013-03-13 — End: 2013-03-14
  Administered 2013-03-13 – 2013-03-14 (×2): 20 mg via ORAL
  Filled 2013-03-13 (×3): qty 1

## 2013-03-13 MED ORDER — ASPIRIN 81 MG PO TBEC *I*
81.0000 mg | DELAYED_RELEASE_TABLET | Freq: Every day | ORAL | Status: DC
Start: 2013-03-13 — End: 2013-03-14
  Administered 2013-03-14: 81 mg via ORAL
  Filled 2013-03-13 (×2): qty 1

## 2013-03-13 MED ORDER — AMLODIPINE BESYLATE 5 MG PO TABS *I*
5.0000 mg | ORAL_TABLET | Freq: Every day | ORAL | Status: DC
Start: 2013-03-13 — End: 2013-03-14
  Administered 2013-03-14: 5 mg via ORAL
  Filled 2013-03-13: qty 1

## 2013-03-13 NOTE — ED Notes (Signed)
Bed: AC-07R  Expected date:   Expected time:   Means of arrival:   Comments:

## 2013-03-13 NOTE — Progress Notes (Addendum)
Utilization Management    Level of Care Observation service as of the date 03/13/2013  Patient notified of observation level of care.  Patient verbalized understanding and UM phone number provided.    Janaa Acero, RN     Pager: [phone]

## 2013-03-13 NOTE — ED Obs Notes (Signed)
ED OBSERVATION ADMISSION NOTE    Patient seen by me today, 03/13/2013 at 5:28 PM    Current patient status: Observation    History     Chief Complaint   Patient presents with   . Palpitations     HPI Comments: 47 yo STEMI  April 04 2011 had a bare metal stent treated with ticagrelor for over 1 year. In dec had a stress test. On Friday at 1300 had chest pain 1 minutes relieved with NTG and recurrent palpitations all day. Yesterday mowed his lawn, sharp chest pain lasted 10 to 15 minutes, warm sweats short of breath, stop for a few minutes,, mowed the lawn, felt palpitations. Yesterday at 1530 while driving to [facility] developed right sided chest pain, radiated to shoulder blade, pain persisted till 2:00 PM and helped with NTg, now 2/10 chest pain. Repeat EKG no STE. Troponin pending.     Patient is a 47 y.o. male presenting with chest pain.   History provided by:  Patient and friend  Language interpreter used: No    Chest Pain  Associated symptoms: shortness of breath        Past Medical History   Diagnosis Date   . Prostatitis    . Anxiety    . Benign prostatic hypertrophy    . Depression    . Discogenic syndrome    . Nicotine dependence    . Coronary artery disease    . Palpitations      not currently   . HTN (hypertension) 04/10/2011   . STEMI (ST elevation myocardial infarction) 04/04/2011     anterior    . Back pain      lumbar   . Chest pain, unspecified      occurred 12/13   . Postsurgical aortocoronary bypass status 10/12     x1 bare metal stent.cardiologist Dr.Pedulla   . Snoring    . GERD (gastroesophageal reflux disease)        Past Surgical History   Procedure Laterality Date   . Ankle surgery     . Cardiac catheterization  07/2011     mild nonobstructive disease-stent patent   . Coronary angioplasty with stent placement  04/04/11     BMS to LAD   . Elbow surgery Left 09/2012       Family History   Problem Relation Age of Onset   . Diabetes Mother    . High cholesterol Mother    . Heart disease Father    .  Diabetes Father        Social History      reports that he quit smoking about 23 months ago. His smoking use included Cigarettes. He has a 19.2 pack-year smoking history. He has never used smokeless tobacco. He reports that  drinks alcohol. He reports that he does not use illicit drugs. His sexual activity history is not on file.    Living Situation    Questions Responses    Patient lives with Spouse    Homeless No    Caregiver for other family member     External Services None    Employment Disabled    Domestic Violence Risk No          Review of Systems   Review of Systems   Respiratory: Positive for shortness of breath.    Cardiovascular: Positive for chest pain.       Physical Exam   BP 116/65  Pulse 72  Temp(Src) 36.5 C (97.7 F) (  Temporal)  Resp 16  SpO2 97%    Physical Exam   Nursing note and vitals reviewed.  Constitutional: He is oriented to person, place, and time. He appears well-developed and well-nourished. No distress.   Apprehended, slight pale, O2 applied.   HENT:   Head: Normocephalic.   Eyes: Conjunctivae and EOM are normal. Pupils are equal, round, and reactive to light.   Neck: Normal range of motion. Neck supple. No JVD present.   Cardiovascular: Normal rate, regular rhythm, normal heart sounds and intact distal pulses.  Exam reveals no gallop.    No murmur heard.  Pulmonary/Chest: Effort normal and breath sounds normal. No stridor. No respiratory distress. He has no wheezes. He has no rales.   Abdominal: Soft. Bowel sounds are normal. He exhibits no distension. There is no tenderness.   Musculoskeletal: He exhibits no edema.   Lymphadenopathy:     He has no cervical adenopathy.   Neurological: He is alert and oriented to person, place, and time. No cranial nerve deficit. He exhibits normal muscle tone. Coordination normal.   Skin: Skin is warm and dry. No rash noted. He is not diaphoretic.   Psychiatric: He has a normal mood and affect. His behavior is normal. Thought content normal.        Tests    HCW:CBJSEG sinus rhythm, no ST elevation.     Labs:   All labs in the last 24 hours:   Recent Results (from the past 24 hour(s))   HOLD BLUE    Collection Time     03/13/13 10:20 AM       Result Value Range    Hold Blue HOLD TUBE     CBC AND DIFFERENTIAL    Collection Time     03/13/13 10:23 AM       Result Value Range    WBC 5.8  4.2 - 9.1 THOU/uL    RBC 4.6  4.6 - 6.1 MIL/uL    Hemoglobin 14.7  13.7 - 17.5 g/dL    Hematocrit 43  40 - 51 %    MCV 93 (*) 79 - 92 fL    RDW 13.2  11.6 - 14.4 %    Platelets 306  150 - 330 THOU/uL    Seg Neut % 59.9  34.0 - 67.9 %    Lymphocyte % 29.3  21.8 - 53.1 %    Monocyte % 9.1  5.3 - 12.2 %    Eosinophil % 1.5  0.8 - 7.0 %    Basophil % 0.2  0.2 - 1.2 %    Neut # K/uL 3.5  1.8 - 5.4 THOU/uL    Lymph # K/uL 1.7  1.3 - 3.6 THOU/uL    Mono # K/uL 0.5  0.3 - 0.8 THOU/uL    Eos # K/uL 0.1  0.0 - 0.5 THOU/uL    Baso # K/uL 0.0  0.0 - 0.1 THOU/uL   PLASMA PROF 7 (ED ONLY)    Collection Time     03/13/13 10:23 AM       Result Value Range    Chloride,Plasma 101  96 - 108 mmol/L    CO2,Plasma 24  20 - 28 mmol/L    Potassium,Plasma 4.2  3.4 - 4.7 mmol/L    Sodium,Plasma 137  132 - 146 mmol/L    Anion Gap 12  7 - 16    UN,Plasma 15  6 - 20 mg/dL    Creatinine,Plasma 3.15  0.67 - 1.17 mg/dL  GFR,Caucasian 111      GFR,Black 129      Glucose,Plasma 103 (*) 60 - 99 mg/dL   TROPONIN T    Collection Time     03/13/13 10:23 AM       Result Value Range    Troponin T <0.01  0.00 - 0.02 ng/mL   CK ISOENZYMES    Collection Time     03/13/13 10:23 AM       Result Value Range    CK 147  46 - 171 U/L    Mass CKMB 1.8  0.0 - 4.9 ng/mL   MAGNESIUM    Collection Time     03/13/13 10:23 AM       Result Value Range    Magnesium 1.7  1.3 - 2.1 mEq/L   PHOSPHORUS    Collection Time     03/13/13 10:23 AM       Result Value Range    Phosphorus 3.2  2.7 - 4.5 mg/dL        Imaging:* Portable Chest Standard Ap Single View    03/13/2013   IMPRESSION:   No acute cardiopulmonary disease.   END REPORT    I have personally reviewed the image(s) and the resident's  interpretation and agree with or edited the findings.          Medical Decision Making      Amount and/or Complexity of Data Reviewed  Clinical lab tests: reviewed  Tests in the radiology section of CPT: reviewed  Discuss the patient with other providers: yes  Independent visualization of images, tracings, or specimens: yes (ekg)        Assessment:  47 y.o., male placed in OBS after evaluation in the ED for right sided chest pain radiated to right shoulder similar to his prior STEMI. 4 baby ASA given in ED. Symptoms help with NTG.   Differential Diagnosis includes ACS, doubted musculoskeletal not being reproducible, no GERD like symptoms.          DLD- continue Atorvastatin 40 mg  HTN- Lisinopril 5 mg   Anxiety- continue Sertraline 200 mg PO daily  CAD- h/o STEMI- continue ASA, Imdur 30 mg daily  GERD- prilosec 20 mg PO daily     Plan: Tele, 2 liters nasal oxygen, serial Troponins and ekgs, cardiology consult now with 2/10 chest pain     Medically preferred DVT prophylaxis: None      Julieanne Cotton, MD

## 2013-03-13 NOTE — ED Provider Notes (Addendum)
History     Chief Complaint   Patient presents with   . Palpitations     HPI Comments: 47 y.o. Man presenting with 3 days of palpitations, "butterflies" in his chest with radiation into his throat. He developed chest pain yesterday with exertion, pain improved with NTG. Last night developed chest pain at rest, it has been constant since then. Chest pain radiates to left shoulder and then to the back of his neck. Mild shortness of breath with exertion yesterday. Took 81 mg aspirin at home.    H/o STEMI in 2012. Cardiologist is Dr. Audery Amel.     He notes the chest pressure is R sided non radiating on my exam. He describes intermittent palpitations that have been ongoing for 3 days. The pressure feels similar to the pain when he had his MI.      History provided by:  Patient      Past Medical History   Diagnosis Date   . Prostatitis    . Anxiety    . Benign prostatic hypertrophy    . Depression    . Discogenic syndrome    . Nicotine dependence    . Coronary artery disease    . Palpitations      not currently   . HTN (hypertension) 04/10/2011   . STEMI (ST elevation myocardial infarction) 04/04/2011     anterior    . Back pain      lumbar   . Chest pain, unspecified      occurred 12/13   . Postsurgical aortocoronary bypass status 10/12     x1 bare metal stent.cardiologist Dr.Pedulla   . Snoring    . GERD (gastroesophageal reflux disease)             Past Surgical History   Procedure Laterality Date   . Ankle surgery     . Cardiac catheterization  07/2011     mild nonobstructive disease-stent patent   . Coronary angioplasty with stent placement  04/04/11     BMS to LAD   . Elbow surgery Left 09/2012       Family History   Problem Relation Age of Onset   . Diabetes Mother    . High cholesterol Mother    . Heart disease Father    . Diabetes Father          Social History      reports that he quit smoking about 23 months ago. His smoking use included Cigarettes. He has a 19.2 pack-year smoking history. He has never used  smokeless tobacco. He reports that  drinks alcohol. He reports that he does not use illicit drugs. His sexual activity history is not on file.    Living Situation    Questions Responses    Patient lives with Spouse    Homeless No    Caregiver for other family member     External Services None    Employment Disabled    Domestic Violence Risk No          Problem List     Patient Active Problem List   Diagnosis Code   . Anxiety Disorder NOS 300.00   . Discogenic Syndrome 722.2   . Depression 311   . Benign Prostatic Hypertrophy 600.00   . Lumbar degenerative disc disease 722.52   . STEMI (ST elevation myocardial infarction) 410.90   . Chest pain 786.50   . Hyperlipidemia 272.4   . HTN (hypertension) 401.9   . CAD (coronary atherosclerotic  disease) 414.00   . Palpitations 785.1   . Left elbow pain 719.42   . Lateral epicondylitis  of elbow 726.32       Review of Systems   Review of Systems   Constitutional: Negative for fever.   HENT: Negative for trouble swallowing and neck stiffness.    Eyes: Negative for pain.   Respiratory: Positive for shortness of breath.    Cardiovascular: Positive for chest pain and palpitations.   Gastrointestinal: Negative for abdominal pain.   Genitourinary: Negative for dysuria.   Musculoskeletal: Negative for back pain.   Skin: Negative for wound.   Allergic/Immunologic: Negative for immunocompromised state.   Neurological: Negative for syncope.   Psychiatric/Behavioral: Negative for confusion.       Physical Exam       ED Triage Vitals   BP Heart Rate Heart Rate(via Pulse Ox) Resp Temp Temp Source SpO2 O2 Device O2 Flow Rate   03/13/13 1011 03/13/13 1011 -- 03/13/13 1011 03/13/13 1011 03/13/13 1011 03/13/13 1011 03/13/13 1011 --   120/84 mmHg 82  18 35.8 C (96.4 F) Oral 99 % None (Room air)       Weight           --                          Physical Exam   Nursing note and vitals reviewed.  Constitutional: He is oriented to person, place, and time. He appears well-developed and  well-nourished. No distress.   HENT:   Head: Normocephalic and atraumatic.   Right Ear: External ear normal.   Left Ear: External ear normal.   Eyes: Conjunctivae are normal. No scleral icterus.   Neck: Normal range of motion. Neck supple. No tracheal deviation present.   Cardiovascular: Normal rate, regular rhythm, normal heart sounds and intact distal pulses.    No murmur heard.  Pulmonary/Chest: Effort normal and breath sounds normal. No respiratory distress. He exhibits no tenderness.   Abdominal: Soft. Bowel sounds are normal. There is no tenderness.   Musculoskeletal: He exhibits no edema and no tenderness.   Neurological: He is alert and oriented to person, place, and time.   Skin: Skin is warm and dry. He is not diaphoretic. No erythema.   Psychiatric: He has a normal mood and affect. His behavior is normal.       Medical Decision Making      Amount and/or Complexity of Data Reviewed  Clinical lab tests: ordered  Tests in the radiology section of CPT: ordered  Tests in the medicine section of CPT: ordered        Initial Evaluation:  ED First Provider Contact    Date/Time Event User Comments    03/13/13 1013 ED Provider First Contact FAGAN, HOLLIE Initial Face to Face Provider Contact          Patient seen by me today 03/13/2013 at 1027    Assessment:  47 y.o., male comes to the ED with palpitations and chest pain at rest. Palpitations are corresponding some of the time with PVCs he is having. H/o STEMI s/p BMS in 2012. No risk factors for PE.    Differential Diagnosis includes ACS, pneumonia, electrolyte abnormality, AKI              Plan: cxr  Mag, phos, cbc, ed7, troponin, ck isoenzyme  Aspirin, NTG  Admit to OBS    Quillian Quince, MD  Quillian Quince, MD  Resident  03/13/13 [phone]    Resident Attestation:     Patient seen by me on arrival date of 03/13/2013 at 1415    History:   I reviewed this patient, reviewed the resident's note and agree, with edits as above.  Exam:   I examined this patient,  reviewed the resident's note and agree.    Decision Making:   I discussed with the resident his/her documented decision making  and agree.      Author Zollie Beckers, MD    Zollie Beckers, MD  03/13/13 [phone]

## 2013-03-13 NOTE — Consults (Addendum)
Cardiology Consult Note    Consult question: palpitations  Requesting Service: ED Obs    HPI:  [name] is a 47 yo male hx CAD s/p anterior MI s/p BMS to mid LAD in Oct '12, chronic atypical angina, former tobacco use who presents to ED with chest pressure and palpitations.  Patient has multiple prior ED visits after his MI for atypical chest pain and has had 2 caths and few SPECTs that haven't shown any significant coronary disease.  He is followed by Dr Audery Amel who has him on a nitrate and CCB; he used to be on metoprolol after his MI but stopped it 2/2 bradycardia and fatigue.  He developed palpitations while driving and followed by midsternal chest pressure that resolved 5 minutes later after taking SL nitro tab.  Yesterday he noticed more palpitations and developed right sided chest pressure radiating to neck and back that has been ongoing for 36 hours and both palpitations and chest pressure was worse with mowing lawn and chest pressure resolved 2 minutes later with nitro.  Because of ongoing palpitations, he presented to ED and he and his wife noticed that he was having a lot of PVCs and they correlated with his palpitations.  Unfortunately, we don't have those strips. He was placed in Obs and his palpitations have resolved but he does have mild 2/10 midsternal chest pressure at rest.  He is hungry and would like to eat. He denies any shortness of breath, PND, orthopnea, fevers, chills.  He has not smoked since his MI.    ROS- 10 pt ROS as per HPI otherwise negative.    Past Medical History   Diagnosis Date   . Prostatitis    . Anxiety    . Benign prostatic hypertrophy    . Depression    . Discogenic syndrome    . Nicotine dependence    . Coronary artery disease    . Palpitations      not currently   . HTN (hypertension) 04/10/2011   . STEMI (ST elevation myocardial infarction) 04/04/2011     anterior    . Back pain      lumbar   . Chest pain, unspecified      occurred 12/13   . Postsurgical  aortocoronary bypass status 10/12     x1 bare metal stent.cardiologist Dr.Pedulla   . Snoring    . GERD (gastroesophageal reflux disease)      Past Surgical History   Procedure Laterality Date   . Ankle surgery     . Cardiac catheterization  07/2011     mild nonobstructive disease-stent patent   . Coronary angioplasty with stent placement  04/04/11     BMS to LAD   . Elbow surgery Left 09/2012     Family History   Problem Relation Age of Onset   . Diabetes Mother    . High cholesterol Mother    . Heart disease Father    . Diabetes Father      History     Social History   . Marital Status: Married     Spouse Name: N/A     Number of Children: N/A   . Years of Education: N/A     Social History Main Topics   . Smoking status: Former Smoker -- 0.80 packs/day for 24 years     Types: Cigarettes     Quit date: 04/04/2011   . Smokeless tobacco: Never Used   . Alcohol Use: 0.0 oz/week     .  5 drink(s) per week      Comment: rare   . Drug Use: No   . Sexual Activity: None     Other Topics Concern   . None     Social History Narrative   . None       Allergies:   Allergies   Allergen Reactions   . Latex Other (See Comments)     Hands peel   . Tylenol [Acetaminophen] Itching and Rash   . Percocet [Oxycodone-Acetaminophen] Hives and Itching       Admission Medications:    (Not in a hospital admission)  BP: (116-124)/(65-84)   Temp:  [35.8 C (96.4 F)-36.6 C (97.9 F)]   Temp src:  [-]   Heart Rate:  [68-82]   Resp:  [16-20]   SpO2:  [97 %-99 %]      Physical Examination:  Gen: NAD, A&Ox3, very pleasant, with wife  HEENT: anicteric, MMM, EOMI  Neck: JVD not elevated  Pulm: CTA(B), Neg W/R/R  CV: S1S2 RRR no M/R/G.   Abd: soft, NT, ND, +BS  Ext: no edema, W+P, full ROM  Neuro: non-focal    Labs:    Recent Labs  Lab 03/13/13  1023   WBC 5.8   Hemoglobin 14.7   Hematocrit 43   Platelets 306     No results found for this basename: NA, K, CL, CO2, BUN, CREATININE, CALCIUM, LABALBU, PROT, BILITOT, ALKPHOS, ALT, AST, GLUCOSE,  in the last  168 hours  No results found for this basename: APTT, INR, PTT,  in the last 168 hours  No components found with this basename: CKTOTAL, TROPONINI, TROPONINT, CKMBINDEX,   Chol/HDL Ratio   Date Value Range Status   12/09/2012 3.5   Final        Cholesterol   Date Value Range Status   12/09/2012 128   Final      REFERENCE RANGE:  < 200 Desirable                      200-239 Borderline High                        > 240 High        HDL   Date Value Range Status   12/09/2012 37   Final      REFERENCE RANGE:  < 40 Low                        > 60 High        LDL Calculated   Date Value Range Status   12/09/2012 64   Final      REFERENCE RANGE:  < 100 Optimal                      100-129 Near or above optimal                      130-159 Borderline High                      160-189 High                        > 189 Very High        Triglycerides   Date Value Range Status   12/09/2012 134   Final      REFERENCE  RANGE:  < 150 Normal                      150-199 Borderline High                      200-499 High                        > 500 Very High       ECG:   SR 68, IVCD  SR 68, IVCD, PVC  Ventricular bigeminy    Cardiac Studies:  SPECT Dec '13: Myocardial perfusion SPECT imaging demonstrates: (1)   Probably normal rest and stress myocardial pefusion, as noted; (2) No   evidence for exercise stress-induced ischemia. (3) Compared to the   prior study 04/11/2011, stress and rest myocardial perfusion images   appear improved.     FUNCTION: ECG-gated SPECT myocardial wall motion study shows: (1)   Normal biventricular size and performance. (2) Compared to the prior   study 04/11/2011, mild regional dysfunction of the apex, peri-apex, and   anterolateral wall appear resolved.     Radiology:  * Portable Chest Standard Ap Single View    03/13/2013   IMPRESSION:   No acute cardiopulmonary disease.   END REPORT   I have personally reviewed the image(s) and the resident's  interpretation and agree with or edited the findings.          Assessment:   47 yo male hx CAD s/p anterior MI s/p BMS to mid LAD in Oct '12, chronic atypical angina, former tobacco use who presents to ED with chest pressure and palpitations.  His main concern are the palpitations and likely symptomatic from VPCs.  His chest pressure is atypical and different than his MI pain and likely worsened by VPCs.  His EKG and cardiac markers are reassuring.    Recommendations:  Would add metoprolol 12.5mg  bid, 1st tonight (was previously on 25mg  bid and had lot of fatigue and lightheadedness)  Continue to monitor on telemetry  No further cardiac testing at this time  May continue his nitrate/CCB, statin, ASA    Author: Harden Mo, MD  Note created: 03/13/2013  at: 6:02 PM  Cardiology Fellow  Pager [phone]    Cardiology Consult Attending: I agree with Dr. Lily Peer' note.  Apparently, a SPECT evaluation was performed this morning, and no ischemia was seen.  His palpitations are improved on the low-dose beta blocker, and I would maintain that.  Follow up with Dr. Audery Amel.    Earl Many, MD

## 2013-03-13 NOTE — First Provider Contact (Addendum)
ED Medical Screening Exam Note    Initial provider evaluation performed by   ED First Provider Contact    Date/Time Event User Comments    03/13/13 1013 ED Provider First Contact Rachael Fee Initial Face to Face Provider Contact        47 year old male here with c/o chest pain and palpitations. Reports 3 day history of chest pain that occurs with exertion, relief with nitro. Also reports palpitations, unable to identify provoking symptoms. H/o MI, s/p stent in 2012, followed by Dr. Audery Amel. No nitro yet today. Took 81 mg asa this morning.    Orders placed:  EKG, LABS and will need portable xray ORDERED when in room     Patient requires further evaluation.     [place], [place], 03/13/2013, 10:13 AM

## 2013-03-13 NOTE — ED Notes (Signed)
Plan of Care     Assumed care at 1505. 47 yo M with c/o Palpitations. At 1505, Alert, Oriented x 4. AFVSS. Plan: Tele, Trops, EKGs, R/O.

## 2013-03-13 NOTE — ED Notes (Signed)
Assumed care 10:30.  Pt c/o cp/palpitations x 2 days, with increasing dyspnea on exertion and diaphoresis.   Pt with hx of STEMI and stent placement in 2012.  Pt placed on telemetry.  Wife at bedside. Will continue to monitor and treat per orders.

## 2013-03-13 NOTE — ED Provider Progress Notes (Signed)
ED Provider Progress Note    Neg troponin, EKG with sinus rhythm, no findings of ischemia. He is admitted to OBS.     Quillian Quince, MD, 03/13/2013, 2:29 PM

## 2013-03-13 NOTE — ED Notes (Signed)
C/o palpitations x's 3 days, R chest pain that radiates to to R shoulder and R neck/back.  Pt states he had similar symptoms (R side) when he had MI in 2012.  Took 81mg  ASA pta.  Took nitro yesterday with relief. No nitro today.  DOE yesterday, currently denies.  Woke up sweaty last night. Currently diaphoresis, n/v, or dizziness. EKG in triage.

## 2013-03-13 NOTE — ED Notes (Signed)
C/o palpitations x 3 days, chest pain, h/o stent

## 2013-03-14 ENCOUNTER — Encounter: Payer: Self-pay | Admitting: Sports Medicine

## 2013-03-14 ENCOUNTER — Ambulatory Visit: Payer: Self-pay

## 2013-03-14 ENCOUNTER — Other Ambulatory Visit: Payer: Self-pay | Admitting: Gastroenterology

## 2013-03-14 ENCOUNTER — Ambulatory Visit: Payer: Self-pay | Admitting: Rehabilitative and Restorative Service Providers"

## 2013-03-14 MED ORDER — INFLUENZA VAC SPLIT QUAD PF 0.5 ML IM SUSP *I*
0.5000 mL | INTRAMUSCULAR | Status: AC
Start: 2013-03-14 — End: 2013-03-14
  Administered 2013-03-14: 0.5 mL via INTRAMUSCULAR
  Filled 2013-03-14: qty 0.5

## 2013-03-14 MED ORDER — METOPROLOL TARTRATE 25 MG PO TABS *I*
12.5000 mg | ORAL_TABLET | Freq: Two times a day (BID) | ORAL | Status: DC
Start: 2013-03-14 — End: 2013-04-15

## 2013-03-14 NOTE — Discharge Instructions (Signed)
Please start taking Metoprolol 12.5mg  by mouth twice daily.    Please call your regular doctor and let them know that you were seen in the Emergency department today and stayed overnight in the Observation area for further evaluation of your chest pain. Please follow up with them in the next 3-5 days for re-evaluation.    Please call your Cardiologist, Dr. Audery Amel and schedule an appointment to follow up in the next 1-2 weeks.    Please return to the Emergency department immediately if you develop worsening pain, persistent fevers/chills or shortness of breath.    Your stress test was negative.

## 2013-03-14 NOTE — ED Notes (Signed)
Plan of Care     Routine meds, analgesics prn, tele cont, NPO for Nuc stress 1230, Cards consult, VS Q4hrs, Observation.

## 2013-03-14 NOTE — ED Notes (Signed)
Plan of Care     VS Q4; Pain management; Tele; EKG/Trop x3 MI ruleout; NPO after MN; Scheduled Test in AM; Continuation of home medications; Will continue to monitor.

## 2013-03-14 NOTE — ED Obs Notes (Addendum)
ED OBSERVATION DISCHARGE NOTE    Patient seen by me today, 03/14/2013 at 10:20am.    Current patient status: Observation    Subjective:  47 yo male being evaluated in OBS for chest pain. Patient reports mild 2/10 substernal discomfort at rest. Reports palpitations have improved since yesterday, felt a few this morning. Denies nausea, vomiting, diaphoresis. Reports the chest pain feels a little similar to his previous MI. His last stress test was December 2013. He is scheduled for a stress test at 12:30pm today, ordered by Dr. Oneida Alar (OBS) yesterday evening. He was seen by the Cardiology fellow last night and recommended to restart metoprolol at lower dose for his palpations. Awaiting Cardiology attending note as well.     Observation Stay Includes:  47 y.o.male who presented to the ED with Chief Complaint   Patient presents with   . Palpitations       Last Nursing documented pain:  0-10 Scale: 0 (03/14/13 0958)      Vitals:  Patient Vitals for the past 24 hrs:   BP Temp Temp src Pulse Resp SpO2 Height Weight   03/14/13 0958 127/83 mmHg 36 C (96.8 F) TEMPORAL 69 18 97 % - -   03/14/13 0622 - - - - - - 1.829 m (6') 87.544 kg (193 lb)   03/14/13 0609 110/69 mmHg 36.2 C (97.2 F) TEMPORAL 79 16 98 % - -   03/14/13 0202 101/74 mmHg 35.8 C (96.4 F) TEMPORAL 70 16 98 % - -   03/13/13 2245 120/75 mmHg 36 C (96.8 F) TEMPORAL 60 16 96 % - -   03/13/13 1707 122/75 mmHg 36.6 C (97.9 F) TEMPORAL 68 18 97 % - -   03/13/13 1620 116/65 mmHg 36.5 C (97.7 F) TEMPORAL 72 16 97 % - -   03/13/13 1105 124/69 mmHg - - 70 20 98 % - -         Physical Exam:  Physical Exam   Constitutional: He is oriented to person, place, and time. He appears well-developed and well-nourished. No distress.   HENT:   Head: Normocephalic and atraumatic.   Eyes: Conjunctivae are normal.   Neck: Neck supple.   Cardiovascular: Normal rate, regular rhythm, normal heart sounds and intact distal pulses.  Exam reveals no gallop and no friction rub.    No  murmur heard.  Pulmonary/Chest: Effort normal and breath sounds normal. No respiratory distress. He has no wheezes. He has no rales. He exhibits no tenderness.   Abdominal: He exhibits no distension.   Neurological: He is alert and oriented to person, place, and time.   Skin: He is not diaphoretic.   Psychiatric: He has a normal mood and affect. His behavior is normal. Judgment and thought content normal.       EKG: SR 68, IVCD, PVC    Labs:   All labs in the last 72 hours:  Recent Results (from the past 72 hour(s))   HOLD BLUE    Collection Time     03/13/13 10:20 AM       Result Value Range    Hold Blue HOLD TUBE     CBC AND DIFFERENTIAL    Collection Time     03/13/13 10:23 AM       Result Value Range    WBC 5.8  4.2 - 9.1 THOU/uL    RBC 4.6  4.6 - 6.1 MIL/uL    Hemoglobin 14.7  13.7 - 17.5 g/dL    Hematocrit 43  40 - 51 %    MCV 93 (*) 79 - 92 fL    RDW 13.2  11.6 - 14.4 %    Platelets 306  150 - 330 THOU/uL    Seg Neut % 59.9  34.0 - 67.9 %    Lymphocyte % 29.3  21.8 - 53.1 %    Monocyte % 9.1  5.3 - 12.2 %    Eosinophil % 1.5  0.8 - 7.0 %    Basophil % 0.2  0.2 - 1.2 %    Neut # K/uL 3.5  1.8 - 5.4 THOU/uL    Lymph # K/uL 1.7  1.3 - 3.6 THOU/uL    Mono # K/uL 0.5  0.3 - 0.8 THOU/uL    Eos # K/uL 0.1  0.0 - 0.5 THOU/uL    Baso # K/uL 0.0  0.0 - 0.1 THOU/uL   PLASMA PROF 7 (ED ONLY)    Collection Time     03/13/13 10:23 AM       Result Value Range    Chloride,Plasma 101  96 - 108 mmol/L    CO2,Plasma 24  20 - 28 mmol/L    Potassium,Plasma 4.2  3.4 - 4.7 mmol/L    Sodium,Plasma 137  132 - 146 mmol/L    Anion Gap 12  7 - 16    UN,Plasma 15  6 - 20 mg/dL    Creatinine,Plasma 1.61  0.67 - 1.17 mg/dL    GFR,Caucasian 096      GFR,Black 129      Glucose,Plasma 103 (*) 60 - 99 mg/dL   TROPONIN T    Collection Time     03/13/13 10:23 AM       Result Value Range    Troponin T <0.01  0.00 - 0.02 ng/mL   CK ISOENZYMES    Collection Time     03/13/13 10:23 AM       Result Value Range    CK 147  46 - 171 U/L    Mass CKMB 1.8   0.0 - 4.9 ng/mL   MAGNESIUM    Collection Time     03/13/13 10:23 AM       Result Value Range    Magnesium 1.7  1.3 - 2.1 mEq/L   PHOSPHORUS    Collection Time     03/13/13 10:23 AM       Result Value Range    Phosphorus 3.2  2.7 - 4.5 mg/dL   TROPONIN T    Collection Time     03/13/13  5:09 PM       Result Value Range    Troponin T <0.01  0.00 - 0.02 ng/mL   TROPONIN T    Collection Time     03/13/13 10:47 PM       Result Value Range    Troponin T <0.01  0.00 - 0.02 ng/mL     Imaging findings: * Portable Chest Standard Ap Single View    03/13/2013   IMPRESSION:   No acute cardiopulmonary disease.   END REPORT   I have personally reviewed the image(s) and the resident's  interpretation and agree with or edited the findings.       Cardiac Testing:  SPECT : (12:30pm 9/29 scheduled)   Consults: Cardiology    Assessment: 47 yo male  Hx CAD s/p anterior MI s/p BMS to mid LAD in Oct 2012, chronic atypical angina, former tobacco user being evaluated in OBS for 2 days of chest  pain and palpitations. Reports pain nitrate responsive and feels similar to previous MI. Ruled out for acute MI with negative serial troponins.    Plan:  Chest Pain  - NPO for SPECT at 12:30pm today  - Cardiology consult obtained yesterday evening. Plan for follow up today after stress test.   - Troponins negative  - Continue tele  - Metoprolol resumed at 12.5mg  bid dose last night (holding AM dose)   - Continue nitrate/CCB, statin, ASA    Anxiety  - Continue Sertraline  GERD  - Continue Prilosec     Disposition: Home likely this afternoon if negative stress test  Follow-up:  with in 1 week.  with  Dr. Audery Amel (Cardiology)   Smoking Cessation: NA    Diagnoses that have been ruled out:   None   Diagnoses that are still under consideration:   None   Final diagnoses:   Palpitations   Chest pain       Author: Deetta Perla, PA  Note created: 03/14/2013  at: 10:40 AM    Supervising physician Dr. Daine Floras was immediately available    ADDENDUM: Patient  seen by Cardiology this afternoon. Stress test reported as negative. Continue metoprolol at 12.5mg  bid dose for palpitations. Continue all other regular medications. Follow up with Dr. Audery Amel in 1-2 weeks. Patient discharged home.

## 2013-03-16 ENCOUNTER — Ambulatory Visit
Admit: 2013-03-16 | Discharge: 2013-03-16 | Disposition: A | Discharge status: Home / Self Care | Payer: Self-pay | Source: Ambulatory Visit | Attending: Cardiology | Admitting: Cardiology

## 2013-03-16 ENCOUNTER — Ambulatory Visit: Payer: Self-pay | Admitting: Cardiology

## 2013-03-16 ENCOUNTER — Encounter: Payer: Self-pay | Admitting: Cardiology

## 2013-03-16 VITALS — BP 112/78 | HR 68 | Ht 72.0 in | Wt 197.0 lb

## 2013-03-16 DIAGNOSIS — R002 Palpitations: Secondary | ICD-10-CM

## 2013-03-16 DIAGNOSIS — I10 Essential (primary) hypertension: Secondary | ICD-10-CM

## 2013-03-16 DIAGNOSIS — I2510 Atherosclerotic heart disease of native coronary artery without angina pectoris: Secondary | ICD-10-CM

## 2013-03-16 LAB — EKG 12-LEAD
P: 50 degrees
P: 48 degrees
P: -35 degrees
P: 51 degrees
P: 47 degrees
QRS: -6 degrees
QRS: 6 degrees
QRS: -83 degrees
QRS: 8 degrees
QRS: 5 degrees
Rate: 79 {beats}/min
Rate: 68 {beats}/min
Rate: 52 {beats}/min
Rate: 68 {beats}/min
Rate: 57 {beats}/min
Severity: NORMAL
Severity: NORMAL
Severity: ABNORMAL
Severity: ABNORMAL
Severity: ABNORMAL
Severity: ABNORMAL
Severity: ABNORMAL
Severity: ABNORMAL
Severity: NORMAL
Severity: NORMAL
T: 35 degrees
T: 24 degrees
T: 105 degrees
T: 25 degrees
T: 28 degrees

## 2013-03-16 NOTE — Progress Notes (Signed)
Reason for visit: Follow-up of recent ED visit    History of Present Illness:  We had the pleasure of seeing [name] today in our [facility].  He is a 47 y.o. with tobacco abuse and anxiety that sustained an anterior STEMI on 04/04/11.  He received thrombectomy and successful revascularization with a bare-metal stent to his mid left anterior descending artery.  Since that time, [name] had multiple emergency department visits for atypical symptoms.  This prompted several nuclear stress tests and a repeat angiogram that was unchanged from previous study.  He has been managed somewhat successfully with calcium channel blockers and nitrates.  However on Friday, he began having chest discomfort again.  Symptoms originated while driving and prompted him to take a sublingual nitroglycerin.  They recurred the following day while mowing the lawn and were persistent for several hours.  Discomfort was characterized as sharp and migrated in various spots (central, right-sided, right shoulder).  Episodes were not clearly worsened by activity, meals, or position.  This was accompanied by frequent palpitations.  He reportedly had symptoms while in the emergency department that according to his wife (nurse), correlated with PVCs.  He was subsequently restarted on low-dose metoprolol.  Episodes gradually reduced and he is returned to his baseline state of health.  He also underwent another nuclear stress test, which was unremarkable.  He has not had recurrent lightheadedness or fatigue with low dose beta blocker.  He denied any caffeine/alcohol/nicotine/stimulant use or recent illness.  He does endorse increased stress last week over work and musculoskeletal pains.         Past Medical History   Diagnosis Date   . Prostatitis    . Anxiety    . Benign prostatic hypertrophy    . Depression    . Discogenic syndrome    . Nicotine dependence    . Coronary artery disease     . Palpitations      not currently   . HTN (hypertension) 04/10/2011   . STEMI (ST elevation myocardial infarction) 04/04/2011     anterior    . Back pain      lumbar   . Chest pain, unspecified      occurred 12/13   . Postsurgical aortocoronary bypass status 10/12     x1 bare metal stent.cardiologist Dr.Dalinda Heidt   . Snoring    . GERD (gastroesophageal reflux disease)      Past Surgical History   Procedure Laterality Date   . Ankle surgery     . Cardiac catheterization  07/2011     mild nonobstructive disease-stent patent   . Coronary angioplasty with stent placement  04/04/11     BMS to LAD   . Elbow surgery Left 09/2012         Current Outpatient Prescriptions   Medication Sig   . metoprolol (LOPRESSOR) 25 MG tablet Take 0.5 tablets (12.5 mg total) by mouth 2 times daily   . omeprazole (PRILOSEC) 20 MG capsule take 1 capsule by mouth twice a day   . cyclobenzaprine (FLEXERIL) 10 MG tablet Take 1 tablet (10 mg total) by mouth 3 times daily as needed   . lisinopril (PRINIVIL,ZESTRIL) 5 MG tablet Take 1 tablet (5 mg total) by mouth daily   . atorvastatin (LIPITOR) 40 MG tablet Take 1 tablet (40 mg total) by mouth daily   . sertraline (ZOLOFT) 100 MG tablet take 2 tablets by mouth once daily   . isosorbide mononitrate (IMDUR) 30 MG 24  hr tablet Take 1 tablet (30 mg total) by mouth daily   Swallow whole. Do not crush, break, or chew.   [name] amLODIPine (NORVASC) 5 MG tablet Take 1 tablet (5 mg total) by mouth daily   . nitroglycerin (NITROSTAT) 0.4 MG SL tablet Place 1 tablet (0.4 mg total) under the tongue every 5 minutes as needed   May repeat 2 times then call 911 if pain persists.   [name] aspirin 81 MG EC tablet Take 1 tablet (81 mg total) by mouth daily         Allergies   Allergen Reactions   . Latex Other (See Comments)     Hands peel   . Tylenol [Acetaminophen] Itching and Rash   . Percocet [Oxycodone-Acetaminophen] Hives and Itching       Family History   Problem Relation Age of Onset   . Diabetes Mother    . High  cholesterol Mother    . Heart disease Father    . Diabetes Father        History     Social History   . Marital Status: Married     Spouse Name: N/A     Number of Children: N/A   . Years of Education: N/A     Social History Main Topics   . Smoking status: Former Smoker -- 0.80 packs/day for 24 years     Types: Cigarettes     Quit date: 04/04/2011   . Smokeless tobacco: Never Used   . Alcohol Use: 0.0 oz/week     .5 drink(s) per week      Comment: rare   . Drug Use: No   . Sexual Activity: None     Other Topics Concern   . None     Social History Narrative   . None         Review of Systems:    General: No significant weight change, high stress level  Skin: No new lesions  Eyes: Glasses  Cardiovascular: HPI  Pulmonary: HPI  GI: + nausea  Heme: Easy bruisability   Endo: No DM, thyroid normal  Musculoskeletal: Chronic back and shoulder pains, recent left arm surgery  Neuro: No syncope    PHYSICAL EXAM:  BP 112/78  Pulse 68  Ht 1.829 m (6')  Wt 89.359 kg (197 lb)  BMI 26.71 kg/m2  General: alert, full, NAD  HEENT: anicteric, MMM, no E/E OP, conj pink, no arcus senilis   Neck: no JVD, bruits, or LAD  CV: RRR, ns1/s2, no murmurs, rubs, or gallops  Pulm: CTA B  Abd: soft, NT, ND, +BS  Ext: no edema or cyanosis, 2+ distal pulses  Neuro: no gross focal deficits  Skin: ecchymosis on left forearm    Coronary Angiogram (04/04/11):  One vessel coronary artery disease (LAD). Mildly reduced left ventricular function. Successful percutaneous coronary intervention (bare metal stenting) of the left anterior descending artery. Succesful clot extraction form D2.     Coronary Angiogram (07/22/11):  Mild (non-obstructive) coronary artery disease. Normal LV systolic function.     SPECT (03/14/13):  PERFUSION: Myocardial perfusion SPECT imaging demonstrates: (1) Normal rest and exercise stress myocardial perfusion. (2) Findings are similar to the prior study 05/25/2012.   FUNCTION: ECG-gated SPECT myocardial wall motion study shows: (1)  Normal biventricular size and performance. (2) Compared to the prior study 05/25/2012, findings appear similar.     EKG (12/22/12):  NSR at 72 BPM, normal axis/intervals, no AE, no VH, no pathologic QW or  ST deviation, early tranistion      Assessment & Plan:   NASHAWN DEALMEIDA is a 47 y.o. with prior anterior STEMI that was seen today for follow-up of ED visit.      1) Coronary artery disease and chest pains:  Johnpeter has been difficult to manage due to numerous episodes of chest discomfort.  Description has been inconsistent in location, triggers, and response to interventions.  He has been treating with varying success with a number of agents (nitrates, NSAIDs, anixolytics, CCB, BB).  I suspect the bulk of his symptoms stem from anxiety and reduced pain tolerance.  In the absence of objective findings, would defer additional testing.  I am concerned over the number of antihypertensives he is taking with relatively normal blood pressure. Will stop Imdur and reassess symptoms.   - Continue aspirin, lisinopril 5 mg, amlodipine 5 mg, metoprolol 12.5 mg BID, and atorvastatin 40 mg   - Hold Imdur   - Higher dose BB not tolerated previously   - SL NG as needed     2) Hypertension:  Systolic and diastolic blood pressure are at goal today on current regimen.    - Medications as above   - Low sodium diet (<4 g/day)   - Electrolytes within normal range (03/13/13)    3) Hyperlipidemia: LDL and non-HDL below goal of <70 and <086 mg/dL on recent blood work (5/78/46).    - Continue atorvastatin 40 mg daily   - Obtain fasting lipid panel annually    4) PVCs: Minimal activity on prior Holter.  However, he is symptomatically better with low-dose metoprolol.  We'll continue given underlying coronary disease.  Avoidance of triggers encouraged.      [name], MD  Cardiovascular Disease    Suggest follow-up in 6 months.  Thank you for allowing [place] to participate in this patient's care.  Please call or e-mail  [email or web address] with any questions or concerns regarding his care.

## 2013-03-18 ENCOUNTER — Ambulatory Visit: Payer: Self-pay | Admitting: Rehabilitative and Restorative Service Providers"

## 2013-03-18 ENCOUNTER — Ambulatory Visit: Payer: Self-pay | Admitting: Sports Medicine

## 2013-03-18 ENCOUNTER — Encounter: Payer: Self-pay | Admitting: Sports Medicine

## 2013-03-18 VITALS — BP 121/65 | HR 72 | Ht 72.0 in | Wt 198.0 lb

## 2013-03-18 DIAGNOSIS — M7712 Lateral epicondylitis, left elbow: Secondary | ICD-10-CM

## 2013-03-18 DIAGNOSIS — M25522 Pain in left elbow: Secondary | ICD-10-CM

## 2013-03-18 NOTE — Progress Notes (Signed)
This office note has been dictated.

## 2013-03-18 NOTE — Progress Notes (Signed)
Hand Rehab Daily Note    Shafer A Swab   [id number]   Encounter Diagnosis   Name Primary?   . Lateral epicondylitis (tennis elbow), left Yes     Onset date: several months ago  Date of Surgery: s/p release 09/21/12  S/p debridement 02/15/13    Subjective:  Pain Score: 2  Pain: Improved overall.     Objective:  ROM - Improved, Left, Elbow, EXT 5, FLEX 130  Strength - Improved,  Per function, Per Ther Ex flowsheet, Per patient report  Function: - Improved. Patient has been wearing a pre fab splint for heavier activities and a posterior long arm brace  Education:  Updated HEP. PRE's to be continued.    Wrist PRE's Wt/reps   Heat X   Wrist E/F 8oz/35   Extension (outstretched) 0/35   Flexion 8oz/35   Supination 0/35   Pronation 0/35   Radial Deviation 8oz/35   Ulnar Deviation 8oz/35   Fingers E/F 1oz/30   Flexbar yellow   Weight well 1kg/10   Bike 3min/3min   Phono X     Treatment Modalities:  Moist heat, Phonophoresis with 10% HC  1MHz    Assessment:  Patient responding favorably, Continue present therapy treatment    Plan of Care:  Continue per Plan of care -  As written.     Juliauna Stueve MS,PT,CHT

## 2013-03-21 NOTE — Progress Notes (Signed)
[name], [name] MR #:  [id number]   ACCOUNT #:  [id number] DOB:  October 06, 1965   DICTATED BY:  Lanae Boast, MD DATE OF VISIT:  03/18/2013     Revision elbow debridement and repair of left lateral epicondylitis.  No complaints of pain, swelling, numbness or tingling.  No medication requirements.  Does feel as if he is making good progress.  He is working with the hand therapist and will continue to do so.  Incisions are benign.  Skin is intact.  Neurovascular exam is intact.  He will follow up in 6-8 weeks.             ______________________________  Lanae Boast, MD    MM/MODL  DD:  03/21/2013 07:56:24  DT:  03/21/2013 09:20:36  Job #:  [id number]    cc: Lanae Boast, MD

## 2013-03-25 ENCOUNTER — Ambulatory Visit: Payer: Self-pay | Admitting: Rehabilitative and Restorative Service Providers"

## 2013-03-25 DIAGNOSIS — M7712 Lateral epicondylitis, left elbow: Secondary | ICD-10-CM

## 2013-03-25 NOTE — Progress Notes (Signed)
Hand Rehab Daily Note    CARLE LAQUE   [id number]   Encounter Diagnosis   Name Primary?   . Lateral epicondylitis (tennis elbow), left Yes     Onset date: several months ago  Date of Surgery: s/p release 09/21/12  S/p debridement 02/15/13    Subjective:  Pain Score: 2  Pain: Improved overall.     Objective:  ROM - Improved, Left, Elbow, EXT 5, FLEX 130  Strength - Improved,  Per function, Per Ther Ex flowsheet, Per patient report  Function: - Improved. Patient has been wearing a pre fab splint for heavier activities and a posterior long arm brace  Education:  Updated HEP. PRE's to be continued.    Wrist PRE's Wt/reps   Heat X   Wrist E/F 8oz/35   Extension (outstretched) 0/35   Flexion 8oz/35   Supination 0/35   Pronation 0/35   Radial Deviation 8oz/35   Ulnar Deviation 8oz/35   Fingers E/F 1oz/30   Flexbar yellow   Weight well 1kg/10   Bike 21min/3min   Phono X     Treatment Modalities:  Moist heat, Phonophoresis with 10% HC     Assessment:  Patient responding favorably, Continue present therapy treatment    Plan of Care:  Continue per Plan of care -  As written.     Meredith Mody MS,PT,CHT

## 2013-04-15 ENCOUNTER — Encounter: Payer: Self-pay | Admitting: Primary Care

## 2013-04-15 ENCOUNTER — Ambulatory Visit: Payer: Self-pay | Admitting: Primary Care

## 2013-04-15 VITALS — BP 136/84 | Ht 72.0 in | Wt 201.8 lb

## 2013-04-15 DIAGNOSIS — I10 Essential (primary) hypertension: Secondary | ICD-10-CM

## 2013-04-15 MED ORDER — METOPROLOL TARTRATE 25 MG PO TABS *I*
12.5000 mg | ORAL_TABLET | Freq: Two times a day (BID) | ORAL | Status: DC
Start: 2013-04-15 — End: 2013-11-08

## 2013-04-15 MED ORDER — ROSUVASTATIN CALCIUM 10 MG PO TABS *I*
10.0000 mg | ORAL_TABLET | Freq: Every day | ORAL | Status: DC
Start: 2013-04-15 — End: 2013-05-31

## 2013-04-16 NOTE — Progress Notes (Signed)
S:  [name] returns to the office for follow up of his hypertension and hypercholesterolemia as well as with complaints of headaches, stuttering, choking spells, clumsiness and tingling in his hands during the past 2 months.    He continues with his usual medications.  He denies any problems with dizziness, vision changes, palpitations, shortness of breath, heartburn, nausea, diarrhea, constipation, bleeding problems or fatigue.  He is not in the habit of checking his blood pressures at home.    He reports that his episodic chest pain has reduced in frequency and severity.    He has been experiencing splitting headaches 3-4 times each week.  He cannot recall having had headaches of this nature in the past.  He has not been able to identify any obvious trigger.  He denies any associated nausea, light sensitivity or vision disturbances.  He has never had problems with stuttering in the past.  He finds himself occasionally stumbling.  It feels as if he is dragging his feet at times.  He has had intermittent dullness and tingling in his hands that has caused him to drop things like his cell phone.  He has also been experiencing choking spells a few times a week.  It always seems to occur in association with drinking liquids.    O:  General: Alert, appropriate, pleasant man in no apparent distress.  Neck: Supple, no lymphadenopathy, no thyromegaly, 2+ carotid pulses bilaterally.  Heart: Regular rate and rhythm, no murmur.  Lungs: Clear to auscultation bilaterally.  Abdomen: Positive bowel sounds, soft, nontender, nondistended, no masses, no organomegaly.  Extremities: 2+ radial and 2+ posterior tibial pulses bilaterally.  No clubbing, cyanosis or edema.  Neuro: CN II-XII intact.  No sensory or motor deficits.  Brisk, uniform upper and lower extremity reflexes bilaterally.  5/5 muscle strength in the flexors and extensors of the shoulders, elbows, hips and knees bilaterally.  Romberg test negative.    See blood work  from 12/09/12.    A:  1.  Essential hypertension -- satisfactory control  2.  Hypercholesterolemia -- well controlled  3.  Headaches  4.  Dysphagia    P:  1.  Hold on the use of the atorvastatin since it may be the cause of the headaches and other neurologic disturbances.  2.  After 2 weeks off of the atorvastatin, switch to Crestor 10 mg once daily.  3.  Refer to neurologist, Dr. Latanya Presser for further evaluation of the headaches, numbness in the hands, clumsiness and stuttering.  4.  Continue with other medications as previously prescribed.  5.  Continue to maintain efforts on prudent diet, exercise and weight management.  6.  RTO in 6-7 weeks for follow up of the above problems or sooner if any other problems or concerns.  7.  Complete blood work prior to the next appointment to check an FLP and CMP.

## 2013-05-03 ENCOUNTER — Ambulatory Visit: Payer: Self-pay | Admitting: Neurology

## 2013-05-03 ENCOUNTER — Encounter: Payer: Self-pay | Admitting: Neurology

## 2013-05-03 VITALS — BP 136/72 | HR 66 | Ht 71.0 in | Wt 197.5 lb

## 2013-05-03 DIAGNOSIS — K219 Gastro-esophageal reflux disease without esophagitis: Secondary | ICD-10-CM | POA: Insufficient documentation

## 2013-05-03 DIAGNOSIS — R519 Headache, unspecified: Secondary | ICD-10-CM | POA: Insufficient documentation

## 2013-05-03 DIAGNOSIS — R41840 Attention and concentration deficit: Secondary | ICD-10-CM

## 2013-05-03 DIAGNOSIS — R5383 Other fatigue: Secondary | ICD-10-CM

## 2013-05-03 DIAGNOSIS — R1310 Dysphagia, unspecified: Secondary | ICD-10-CM | POA: Insufficient documentation
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
## 2013-05-13 ENCOUNTER — Encounter: Payer: Self-pay | Admitting: Neurology

## 2013-05-16 ENCOUNTER — Encounter: Payer: Self-pay | Admitting: Sports Medicine

## 2013-05-16 ENCOUNTER — Ambulatory Visit: Payer: Self-pay | Admitting: Sports Medicine

## 2013-05-16 VITALS — BP 135/72 | Ht 71.0 in | Wt 195.0 lb

## 2013-05-16 DIAGNOSIS — M7710 Lateral epicondylitis, unspecified elbow: Secondary | ICD-10-CM

## 2013-05-16 NOTE — Progress Notes (Signed)
This office note has been dictated.

## 2013-05-16 NOTE — Progress Notes (Addendum)
PATIENTLEONDRO, [name] MR #:  [id number]   ACCOUNT #:  [id number] DOB:  11-04-1965   DICTATED BY:  Nira Conn, MD,RES DATE OF VISIT:  05/16/2013     INTERVAL HISTORY:  [name] is a 47 year old male, status post left elbow lateral epicondyle debridement in April 2014, with repeat debridement and repair after traumatic injury to the left lateral epicondyle muscle origins in September 2014.  He is presenting here for approximately 3 month followup visit.  He is overall improved since his last surgery in September, however still not back to 100%.  He is making good progress.  He does continue at times to have some numbness, tingling as well as radicular pains in his left upper extremity.  This is more associated with arm being elevated above 90 degrees, such as putting it up on a couch, etc.  No new symptoms, no new injuries.     Past medical, surgical, social history, medications, allergies reviewed and updated as indicated in the eRecord.    PHYSICAL EXAMINATION:  GENERAL:  [name] is a 47 year old male in no apparent distress.  He is alert and oriented.  LEFT UPPER EXTREMITY:  Examination shows well-healed incision over the left lateral elbow.  He has no swelling or ecchymosis or erythema.  Skin is intact.  He does have tenderness to palpation over the left lateral epicondyle.  He does have pain with resisted wrist extension.  Otherwise, the left upper extremity is neurovascularly intact today.    ASSESSMENT AND PLAN:  [name] is a 47 year old male status post left lateral epicondylitis debridement and subsequent repair after traumatic injury.  He is improving, however not back to 100%.  Given some of his radicular symptoms and pain, as well as a history of lumbar spine disease, he has been referred to Orthopedic Spine for further evaluation of possible cervical spine pathology.  He will return back to see [place] for the left lateral epicondylitis in approximately 3 months.  He will continue his  current regimen with over-the-counter anti-inflammatory medication.  All questions, concerns were addressed today in clinic.     Dr. Elease Hashimoto was present for the patient encounter and in agreement with the plan.       Dictated By:  Nira Conn, MD,RES    I saw and evaluated the patient. I agree with the resident's/fellow's findings and plan of care as documented above.      ______________________________  Lanae Boast, MD    RK/MODL  DD:  05/16/2013 09:49:09  DT:  05/16/2013 13:30:54  Job #:  [id number]    cc: Lanae Boast, MD

## 2013-05-25 ENCOUNTER — Encounter: Payer: Self-pay | Admitting: Physical Medicine and Rehabilitation

## 2013-05-25 ENCOUNTER — Ambulatory Visit: Payer: Self-pay | Admitting: Physical Medicine and Rehabilitation

## 2013-05-25 VITALS — BP 131/71 | Ht 71.0 in | Wt 196.1 lb

## 2013-05-25 DIAGNOSIS — IMO0002 Reserved for concepts with insufficient information to code with codable children: Secondary | ICD-10-CM

## 2013-05-25 DIAGNOSIS — M5412 Radiculopathy, cervical region: Secondary | ICD-10-CM

## 2013-05-25 DIAGNOSIS — M542 Cervicalgia: Secondary | ICD-10-CM

## 2013-05-25 MED ORDER — GABAPENTIN 300 MG PO CAPS
ORAL_CAPSULE | ORAL | Status: DC
Start: 2013-05-25 — End: 2013-07-06
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
## 2013-05-31 ENCOUNTER — Ambulatory Visit: Payer: Self-pay | Admitting: Primary Care

## 2013-05-31 ENCOUNTER — Encounter: Payer: Self-pay | Admitting: Primary Care

## 2013-05-31 ENCOUNTER — Ambulatory Visit
Admit: 2013-05-31 | Discharge: 2013-05-31 | Disposition: A | Discharge status: Home / Self Care | Payer: Self-pay | Source: Ambulatory Visit | Attending: Primary Care | Admitting: Primary Care

## 2013-05-31 VITALS — BP 102/74 | HR 64 | Ht 71.0 in | Wt 198.8 lb

## 2013-05-31 DIAGNOSIS — M791 Myalgia, unspecified site: Secondary | ICD-10-CM

## 2013-05-31 DIAGNOSIS — I10 Essential (primary) hypertension: Secondary | ICD-10-CM

## 2013-05-31 DIAGNOSIS — E7800 Pure hypercholesterolemia, unspecified: Secondary | ICD-10-CM

## 2013-05-31 DIAGNOSIS — I2510 Atherosclerotic heart disease of native coronary artery without angina pectoris: Secondary | ICD-10-CM

## 2013-05-31 LAB — LIPID PANEL
Chol/HDL Ratio: 3.5
Cholesterol: 135 mg/dL
HDL: 39 mg/dL
LDL Calculated: 69 mg/dL
Non HDL Cholesterol: 96 mg/dL
Triglycerides: 135 mg/dL

## 2013-05-31 LAB — COMPREHENSIVE METABOLIC PANEL
ALT: 27 U/L (ref 0–50)
AST: 28 U/L (ref 0–50)
Albumin: 4.7 g/dL (ref 3.5–5.2)
Alk Phos: 81 U/L (ref 40–130)
Anion Gap: 12 (ref 7–16)
Bilirubin,Total: 0.2 mg/dL (ref 0.0–1.2)
CO2: 25 mmol/L (ref 20–28)
Calcium: 8.9 mg/dL (ref 8.6–10.2)
Chloride: 105 mmol/L (ref 96–108)
Creatinine: 0.81 mg/dL (ref 0.67–1.17)
GFR,Black: 121 *
GFR,Caucasian: 105 *
Glucose: 113 mg/dL — ABNORMAL HIGH (ref 60–99)
Lab: 17 mg/dL (ref 6–20)
Potassium: 4.8 mmol/L (ref 3.3–5.1)
Sodium: 142 mmol/L (ref 133–145)
Total Protein: 7 g/dL (ref 6.3–7.7)

## 2013-05-31 NOTE — Progress Notes (Signed)
S:  Mr. Bujnowski returns to the office for follow up of his hypertension, hypercholesterolemia, CAD and musculoskeletal pains.    He met with neurologist, Dr. Latanya Presser as we had discussed at his last appointment.  Dr. Latanya Presser suspects that his stuttering, dysphagia and clumsiness may be a product of fatigue.  He has referred him for a sleep study.    Unfortunately he reports that he had a fall a couple of days ago.  He was descending a steep slope when his feet slipped out from under him and he landed hard against the ground on his upper back.  He had the wind knocked out of him.  He has had increased pain around his shoulders, upper back and chest since that time.  His symptoms are aggravated by changes in position.  He also experiences some pain with deep inspiration, coughing or sneezing.  He denies any of his angina type chest pain.  He denies any palpitations.    He otherwise notes that orthopedic surgeon, Dr. Elease Hashimoto referred him to spine specialist, Dr. Bennie Dallas regarding his upper extremity and lower extremity pain.  They both suspect that his symptoms are radicular in nature.  Dr. Bennie Dallas initiated him on gabapentin which he is gradually titrating.    He otherwise notes that he did suspend his use of the atorvastatin as I had instructed at his last appointment.  He did not notice any improvement in his muscle aches, joint pains or neurologic symptoms after 2 weeks so he proceeded with the Crestor as I had recommended.  He also did not notice any change while taking the Crestor.  Since this medication was expensive he decided to switch back to the atorvastatin, which he continues at this time.  None of these changes led to any changes in the nature of his symptoms.    He otherwise continues with his usual medications.  He denies any problems with dizziness, vision changes, shortness of breath, heartburn, nausea, diarrhea, constipation, bleeding problems or fatigue.  He is not in the habit of checking his  blood pressures at home.    He continues to experience the splitting headaches that he had described previously.  They have reduced in frequency to 2-3 times per week.  He has not been able to identify any obvious trigger.  He denies any associated nausea, light sensitivity or vision disturbances.  He continues to experience intermittent problems with stuttering, stumbling and dragging his feet.  He continues to have intermittent dullness and tingling in his hands that causes him to drop things.  He continues to have occasional choking spells.    O:  General: Alert, appropriate, pleasant man in no apparent distress.  Neck: Supple, no lymphadenopathy, no thyromegaly, 2+ carotid pulses bilaterally.  Heart: Regular rate and rhythm, no murmur.  Lungs: Clear to auscultation bilaterally.  Abdomen: Positive bowel sounds, soft, nontender, nondistended, no masses, no organomegaly.  Extremities: 2+ radial and 2+ posterior tibial pulses bilaterally.  No clubbing, cyanosis or edema.  Neuro: CN II-XII intact.  No sensory or motor deficits.  Brisk, uniform upper and lower extremity reflexes bilaterally.  5/5 muscle strength in the flexors and extensors of the shoulders, elbows, hips and knees bilaterally.    Hospital Outpatient Visit on 05/31/2013   Component Date Value Range Status   . Cholesterol 05/31/2013 135   Final    Comment: REFERENCE RANGE:  < 200 Desirable  200-239 Borderline High                                             > 240 High   . Triglycerides 05/31/2013 135   Final    Comment: REFERENCE RANGE:  < 150 Normal                                           150-199 Borderline High                                           200-499 High                                             > 500 Very High   . HDL 05/31/2013 39   Final    Comment: REFERENCE RANGE:  < 40 Low                                             > 60 High   . LDL Calculated 05/31/2013 69   Final    Comment: REFERENCE  RANGE:  < 100 Optimal                                           100-129 Near or above optimal                                           130-159 Borderline High                                           160-189 High                                             > 189 Very High   . Non HDL Cholesterol 05/31/2013 96   Final    Target is 30mg /dl above(or over) LDL goal   . Chol/HDL Ratio 05/31/2013 3.5   Final   . Sodium 05/31/2013 142  133 - 145 mmol/L Final   . Potassium 05/31/2013 4.8  3.3 - 5.1 mmol/L Final   . Chloride 05/31/2013 105  96 - 108 mmol/L Final   . CO2 05/31/2013 25  20 - 28 mmol/L Final   . Anion Gap 05/31/2013 12  7 - 16 Final   . UN 05/31/2013 17  6 - 20 mg/dL Final   . Creatinine 78/29/5621 0.81  0.67 - 1.17 mg/dL Final   . GFR,Caucasian 05/31/2013 105  Final   . GFR,Black 05/31/2013 121   Final    *UNITS=mL/min/1.73 square meters   . Glucose 05/31/2013 113* 60 - 99 mg/dL Final    Comment: Reference Ranges apply only to FASTING samples.                                                      ADA Guidelines Blood Sugar Levels for Diagnosing Diabetes & Pre-diabetes                           Normal: < 100 mg/dL                           Impaired Fasting Glucose (IFG): 100-125 mg/dL                           Diabetes:  > 126 mg/dL on two different occasions   . Calcium 05/31/2013 8.9  8.6 - 10.2 mg/dL Final   . Total Protein 05/31/2013 7.0  6.3 - 7.7 g/dL Final   . Albumin 16/03/9603 4.7  3.5 - 5.2 g/dL Final   . Bilirubin,Total 05/31/2013 0.2  0.0 - 1.2 mg/dL Final   . AST 54/02/8118 28  0 - 50 U/L Final   . ALT 05/31/2013 27  0 - 50 U/L Final   . Alk Phos 05/31/2013 81  40 - 130 U/L Final       A:  1.  Essential hypertension -- well controlled  2.  Hypercholesterolemia -- well controlled  3.  CAD -- asymptomatic and hemodynamically stable  4.  Myalgias -- aggravated by a recent fall    P:  1.  Continue with current medications at their current doses.  2.  Continue to maintain efforts on prudent diet,  exercise and weight management.  3.  Continue appropriate follow up with neurology, cardiology and orthopedics.  4.  RTO in 6 months for follow up of the above problems or sooner if any other problems or concerns.  5.  Complete blood work prior to the next appointment to check an FLP and CMP.

## 2013-06-01 NOTE — Progress Notes (Signed)
Patient notified his blood work is normal and cholesterol is well controlled. MMorris LPN

## 2013-07-04 ENCOUNTER — Ambulatory Visit: Payer: Self-pay | Admitting: Neurology

## 2013-07-06 ENCOUNTER — Encounter: Payer: Self-pay | Admitting: Neurology

## 2013-07-06 ENCOUNTER — Ambulatory Visit: Payer: Self-pay | Admitting: Physical Medicine and Rehabilitation

## 2013-07-06 ENCOUNTER — Encounter: Payer: Self-pay | Admitting: Physical Medicine and Rehabilitation

## 2013-07-06 VITALS — BP 138/76 | HR 86 | Ht 72.0 in | Wt 201.0 lb

## 2013-07-06 DIAGNOSIS — M5412 Radiculopathy, cervical region: Secondary | ICD-10-CM

## 2013-07-06 DIAGNOSIS — M542 Cervicalgia: Secondary | ICD-10-CM

## 2013-07-06 MED ORDER — GABAPENTIN 300 MG PO CAPS
300.0000 mg | ORAL_CAPSULE | Freq: Four times a day (QID) | ORAL | Status: DC
Start: 2013-07-06 — End: 2013-11-18

## 2013-07-06 NOTE — Progress Notes (Signed)
[name] returns today for a followup visit.  In December he had a fall and landed hard against the ground on his upper back.  Patient reported increased pain around her shoulders, upper back and chest since that time.  He continues to have limitation in neck movement with neck pain primarily in the left greater than the right side.  He is taking gabapentin 3 times a day  but does not notice any difference.    On exam patient is restriction in cervical range of motion bilaterally.  He has tenderness to palpation in the left upper trapezius muscle.    Recommendations: Patient is a had cervical myofascial pain that certainly was aggravated by his recent fall.  This is in the setting of ulnar neuritis.  He will increase gabapentin for neuropathic symptoms up to 1200 mg a day.  I will perform a trigger point injection into the left upper trap.  I showed him some specific cervical stretching exercises.    Procedure: After informed verbal consent, using a 25G needle, one cc of 0.25% Marcaine was injected under sterile conditions after negative aspiration was obtained into the site of maximal tenderness. No complications.       Followup in 6-8 weeks.

## 2013-07-08 ENCOUNTER — Encounter: Payer: Self-pay | Admitting: Primary Care

## 2013-07-08 ENCOUNTER — Ambulatory Visit: Payer: Self-pay | Admitting: Primary Care

## 2013-07-08 VITALS — BP 118/76 | Ht 72.0 in | Wt 201.4 lb

## 2013-07-08 DIAGNOSIS — R55 Syncope and collapse: Secondary | ICD-10-CM

## 2013-07-09 NOTE — Progress Notes (Signed)
S:  [name] presents to the office with his wife with concerns regarding a brief loss of consciousness today.    He reports that he was laying in bed watching television at around 1:00 p.m. this afternoon when he suddenly felt tired, weak and clumsy.  He had some blurry vision.  He attempted to maneuver to the side of the bed.  He believes that he slid off of the bed and landed on his knees on the floor.  The next thing he remembers is that his son was knocking on the door asking him if he was all right.  His son had her a loud thump.  He awoke and realized that he was sitting on his knees beside the bed.  He did not hit his head and had no significant injury but he was aware of a headache at this point.  He took 400 mg of ibuprofen and reports that the headache has been gradually diminishing since that time.    He continued to feel a little sluggish after this event.  He decided to come in for an appointment to discuss it.  He still feels a little off but reports that he is gradually improving.  He has not had any associated chest pain, palpitations, shortness of breath, heartburn, nausea, diarrhea, constipation, fevers, chills or sweats.  He has had a stuffy nose, runny nose and mildly productive cough during the past 2 days.  He is uncertain as to whether this is related to his episode today.    He continues with his usual medications although he notes that Dr. Christen Bame had him increase his dose of gabapentin 2 days ago.  He was taking 300 mg in the morning and 600 mg at bedtime.  This was increased to 300 mg in the morning, 300 mg at lunch and 600 mg at bedtime.  He had taken his midday dose 1 hour prior to the onset of today's symptoms.    O:  General: Alert, appropriate, pleasant man in no apparent distress.  HEENT: Sclera and conjunctiva clear, TMs WNL.  Nasal mucosa with mild erythema, edema and mucous congestion.  Mild peritonsillar erythema but no tonsillar enlargement or exudates.  No frontal or  maxillary sinus tenderness.  Neck: Supple, no lymphadenopathy, no thyromegaly, 2+ carotid pulses bilaterally.  Heart: Regular rate and rhythm, no murmur.  Lungs: Clear to auscultation bilaterally.  Abdomen: Positive bowel sounds, soft, nontender, nondistended, no masses, no organomegaly.  Extremities: 2+ radial and 2+ posterior tibial pulses bilaterally.  No clubbing, cyanosis or edema.  Neuro: CN II-XII intact.  No sensory or motor deficits.  Brisk, uniform upper and lower extremity reflexes bilaterally.  5/5 muscle strength in the flexors and extensors of the shoulders, elbows, hips and knees bilaterally.    EKG obtained in the office today identified NSR with partial RBBB but was otherwise normal.    A:  Syncope    P:  1.  Advised patient that I suspect that his symptoms were a product of his new dose of gabapentin.  Suggested that he discuss this with Dr. Christen Bame and decide whether a lower dose would be more suitable.  2.  Continue with other medications as previously prescribed.  3.  RTO as needed if any persistent, recurrent or worsening problems in regards to the above.

## 2013-07-12 ENCOUNTER — Other Ambulatory Visit: Payer: Self-pay | Admitting: Primary Care

## 2013-08-01 ENCOUNTER — Other Ambulatory Visit: Payer: Self-pay | Admitting: Cardiology

## 2013-08-01 MED ORDER — NITROGLYCERIN 0.4 MG SL SUBL *I*
0.4000 mg | SUBLINGUAL_TABLET | SUBLINGUAL | Status: DC | PRN
Start: 2013-08-01 — End: 2014-11-28

## 2013-08-08 ENCOUNTER — Encounter: Payer: Self-pay | Admitting: Neurology

## 2013-08-08 NOTE — Progress Notes (Signed)
Patient requested home study scheduled for 2.27.15 to p/u equipment  No auth needed

## 2013-08-09 ENCOUNTER — Other Ambulatory Visit: Payer: Self-pay | Admitting: Primary Care

## 2013-08-10 ENCOUNTER — Telehealth: Payer: Self-pay

## 2013-08-10 ENCOUNTER — Other Ambulatory Visit: Payer: Self-pay | Admitting: Cardiology

## 2013-08-10 MED ORDER — AMLODIPINE BESYLATE 5 MG PO TABS *I*
5.0000 mg | ORAL_TABLET | Freq: Every day | ORAL | Status: DC
Start: 2013-08-10 — End: 2014-08-14

## 2013-08-10 NOTE — Telephone Encounter (Signed)
This Direct Referral sleep study is approved

## 2013-08-10 NOTE — Telephone Encounter (Signed)
[facility]    Patient Name:  SENAY SISTRUNK  MRN #:  [id number]  D.O.B.:  08-09-1965  Sex:    male  Height:   5 ft  42 in  Weight:  197 lbs    Date of Study:   08/12/2013  Study Type: HST-2PM    Referring MD: Di Kindle        PCP:   [name], MD     [phone]    Req & Orders Info:  ORDERS COMPLETED UNDER PROCEDURES TAB     Problem List:   Patient Active Problem List   Diagnosis Code    Anxiety Disorder NOS 300.00    Spondylolisthesis of lumbar region 738.4    Depression 311    Benign Prostatic Hypertrophy 600.00    Lumbar degenerative disc disease 722.52    Chest pain 786.50    Hyperlipidemia 272.4    HTN (hypertension) 401.9    CAD (coronary atherosclerotic disease) 414.00    Palpitations 785.1    Lateral epicondylitis  of elbow 726.32    GERD (gastroesophageal reflux disease) 530.81    Headache 784.0    Attention and concentration deficit 799.51    Difficulty swallowing 787.20    Fatigue 780.79       Medications:   Current Outpatient Prescriptions on File Prior to Visit   Medication Sig Dispense Refill    amLODIPine (NORVASC) 5 MG tablet Take 1 tablet (5 mg total) by mouth daily  30 tablet  11    omeprazole (PRILOSEC) 20 MG capsule take 1 capsule by mouth twice a day  60 capsule  5    nitroglycerin (NITROSTAT) 0.4 MG SL tablet Place 1 tablet (0.4 mg total) under the tongue every 5 minutes as needed   May repeat 2 times then call 911 if pain persists.  25 tablet  5    sertraline (ZOLOFT) 100 MG tablet take 2 tablets by mouth once daily  60 tablet  6    gabapentin (NEURONTIN) 300 MG capsule Take 1 capsule (300 mg total) by mouth 4 times daily  120 capsule  2    metoprolol (LOPRESSOR) 25 MG tablet Take 12.5 mg by mouth 2 times daily        sertraline (ZOLOFT) 100 MG tablet Take 200 mg by mouth nightly        cyclobenzaprine (FLEXERIL) 10 MG tablet Take 1 tablet (10 mg total) by mouth 3 times daily as needed  60 tablet  5     lisinopril (PRINIVIL,ZESTRIL) 5 MG tablet Take 1 tablet (5 mg total) by mouth daily  30 tablet  11    atorvastatin (LIPITOR) 40 MG tablet Take 1 tablet (40 mg total) by mouth daily  30 tablet  11    isosorbide mononitrate (IMDUR) 30 MG 24 hr tablet Take 1 tablet (30 mg total) by mouth daily   Swallow whole. Do not crush, break, or chew.  30 tablet  11    aspirin 81 MG EC tablet Take 1 tablet (81 mg total) by mouth daily           No current facility-administered medications on file prior to visit.       Allergies:   Allergies   Allergen Reactions    Latex Other (See Comments)     Hands peel    Tylenol [Acetaminophen] Itching and Rash    Percocet [Oxycodone-Acetaminophen] Hives and Itching  Patient Employer:     Employment Status:   NOT EMPLOYED    Usual Sleep/RiseTimes:  9-10PM TO 5-6AM  Usual Work Hours:         NONE    Sleep Problem/Reason for Referral:  OBSERVED APNEAS/SNORING, VERY BROKEN SLEEP- AWAKE A LOT, SNORES LOUDLY    Tech Comments / Special Needs:  NONE      History Call Date = 2.25.15    By:  Grant Fontana III

## 2013-08-11 NOTE — Progress Notes (Signed)
[facility] SLEEP TESTING EQUIPMENT AGREEMENT FORM      PATIENT NAME:  [name]     MRN #:    [id number]           PHONE NUMBER:   [phone]     ADDRESS:   [address] [place] [address]      I have received a Home Sleep Monitor ([employer] from the [facility] and have been instructed how to use it.    Serial Number:     I have been informed that I am to wear this monitor for one night.    I have agreed to return this monitor to [address]. on  ______________ between 6:00 AM and 11:30 AM.    If you have any questions during the evening or night, please call  our office at [phone].    TO RETURN THE MONITOR    The monitor is to be dropped off at the front desk at [facility].  Please be sure that it is packaged in the case that was given to you.    This monitor is the property of [facility].  The value of this equipment is $2,495.  If you fail to return it by the date stated above, I understand that the Hospitals legal authorities will be notified.      Failure to return this monitor is considered theft of hospital property,      Signed:  ________________________   Date: _________      Witness:  _______________________   Date: _________         [facility] TEST QUESTIONNAIRE      Patient Name:        [name]    MRN #  [id number]      0. What was your approximate bedtime? _________________________      2.  How long did it take you to fall asleep? _________________________      3. How many times did you wake up? _________________________      a. Approximatley what time? / How long were you awake?    _____________/_______________    _____________/_______________    _____________/_______________    _____________/_______________    4. At what time did you get up for the day? __________________________    5. Any problems or  comments?  __________________________               Patient signature:______________________   Date:_______________               Received by:       _______________________ Date:_______________

## 2013-08-12 ENCOUNTER — Ambulatory Visit: Payer: Self-pay

## 2013-08-12 NOTE — Progress Notes (Incomplete)
[facility] TEST FORM    Patient name: [name]    MRN # :  [id number]    ESS:   10   NECK:  17 "      What is your usual bedtime?   9-10:00 p.m.  What is your usual risetime?  5-6:00 a.m.  What time did you wake up today? 5:00 a.m.  How many hours of sleep last night/day? 5-6 (FREQ AWAKENINGS) hours    Any naps taken today?   No    Any alcohol or caffeine intake since noon? No    Current Outpatient Prescriptions on File Prior to Visit   Medication Sig Dispense Refill    amLODIPine (NORVASC) 5 MG tablet Take 1 tablet (5 mg total) by mouth daily  30 tablet  11    omeprazole (PRILOSEC) 20 MG capsule take 1 capsule by mouth twice a day  60 capsule  5    nitroglycerin (NITROSTAT) 0.4 MG SL tablet Place 1 tablet (0.4 mg total) under the tongue every 5 minutes as needed   May repeat 2 times then call 911 if pain persists.  25 tablet  5    sertraline (ZOLOFT) 100 MG tablet take 2 tablets by mouth once daily  60 tablet  6    gabapentin (NEURONTIN) 300 MG capsule Take 1 capsule (300 mg total) by mouth 4 times daily  120 capsule  2    metoprolol (LOPRESSOR) 25 MG tablet Take 12.5 mg by mouth 2 times daily        sertraline (ZOLOFT) 100 MG tablet Take 200 mg by mouth nightly        cyclobenzaprine (FLEXERIL) 10 MG tablet Take 1 tablet (10 mg total) by mouth 3 times daily as needed  60 tablet  5    lisinopril (PRINIVIL,ZESTRIL) 5 MG tablet Take 1 tablet (5 mg total) by mouth daily  30 tablet  11    atorvastatin (LIPITOR) 40 MG tablet Take 1 tablet (40 mg total) by mouth daily  30 tablet  11    isosorbide mononitrate (IMDUR) 30 MG 24 hr tablet Take 1 tablet (30 mg total) by mouth daily   Swallow whole. Do not crush, break, or chew.  30 tablet  11    aspirin 81 MG EC tablet Take 1 tablet (81 mg total) by mouth daily           No current facility-administered medications on file prior to visit.       Patient Active Problem List   Diagnosis Code    Anxiety Disorder NOS 300.00     Spondylolisthesis of lumbar region 738.4    Depression 311    Benign Prostatic Hypertrophy 600.00    Lumbar degenerative disc disease 722.52    Chest pain 786.50    Hyperlipidemia 272.4    HTN (hypertension) 401.9    CAD (coronary atherosclerotic disease) 414.00    Palpitations 785.1    Lateral epicondylitis  of elbow 726.32    GERD (gastroesophageal reflux disease) 530.81    Headache 784.0    Attention and concentration deficit 799.51    Difficulty swallowing 787.20    Fatigue 780.79       Sleep Diary Entries    1)   What was your approximate bedtime? {NUMBERS:26665}:{TIME (IN 15 MINS):32620} {MISC; AM/PM:25571}    2)   How long did it take you to fall asleep? {NUMBERS; 5-50 BY 5:60395} {TIME;SECONDS/MINUTES/HOURS:23737}  3)  How many times do you remember waking up? {NUMBERS:26665}     A) Approx. Time ? / How long were you awake?     {NUMBERS:26665}:{TIME (IN 15 MINS):32620}/  {NUMBERS; 5-50 BY 5:60395} {TIME;SECONDS/MINUTES/HOURS:23737}    {NUMBERS:26665}:{TIME (IN 15 MINS):32620}/  {NUMBERS; 5-50 BY 5:60395} {TIME;SECONDS/MINUTES/HOURS:23737}    {NUMBERS:26665}:{TIME (IN 15 MINS):32620}/  {NUMBERS; 5-50 BY 5:60395} {TIME;SECONDS/MINUTES/HOURS:23737}    {NUMBERS:26665}:{TIME (IN 15 MINS):32620}/  {NUMBERS; 5-50 BY 5:60395} {TIME;SECONDS/MINUTES/HOURS:23737}    4) What time did you finally wake up for the day?  {NUMBERS:26665}:{TIME (IN 15 MINS):32620} {MISC; AM/PM:25571}    5)  Any problems or Comments?    ***

## 2013-08-15 ENCOUNTER — Ambulatory Visit: Payer: Self-pay | Admitting: Sports Medicine

## 2013-08-15 ENCOUNTER — Encounter: Payer: Self-pay | Admitting: Sports Medicine

## 2013-08-15 VITALS — BP 116/68 | Ht 72.0 in | Wt 200.0 lb

## 2013-08-15 DIAGNOSIS — M7710 Lateral epicondylitis, unspecified elbow: Secondary | ICD-10-CM

## 2013-08-15 NOTE — Progress Notes (Signed)
[name], [name] MR #:  [id number]   ACCOUNT #:  [id number] DOB:  Feb 12, 1966   DICTATED BY:  Berenice Bouton, PA DATE OF VISIT:  08/15/2013     CHIEF COMPLAINT:  Follow up left elbow revision open lateral epicondyle debridement in April 2014, and September 2014.    INTERVAL HISTORY:  The patient states that he still has pain.  He states he is willing to live with it.  He states that maybe he feels mild improvement from the second surgery but it is not drastic.  He does not feel worse.  He is still complaining of radicular symptoms mostly in the 4th and 5th fingers.  He saw Dr. Christen Bame, who felt that he had ulnar neuritis and nothing coming from the cervical spine.    PHYSICAL EXAMINATION:  On physical exam of his left elbow, he has good range of motion.  Benign incision.  No swelling.  He is still tender over the lateral epicondyle.  Still has pain with resisted wrist extension.  He complains of tingling in the 4th and 5th fingers.    ASSESSMENT:  Status post 2 left elbow lateral epicondylitis, debridement, repair surgeries.    PLAN:  The patient was seen and evaluated by Dr. Venia Minks today.  Discussed an injection.  The patient was in agreement.  This was performed by Dr. Venia Minks using 5 cc of 1% plain lidocaine mixed with 2 cc of Celestone at the area of maximal tenderness at the lateral epicondyle.  He will rest and ice the elbow.  Follow up in 2-3 months for re-evaluation.       Dictated By:  Berenice Bouton, PA      ______________________________  Hurman Horn, MD    KF/MODL  DD:  08/15/2013 09:15:21  DT:  08/15/2013 10:56:33  Job #:  [id number]    cc: Hurman Horn, MD

## 2013-08-15 NOTE — Patient Instructions (Signed)
Dear [name],    Your physician has determined that you require durable medical equipment (DME) as a part of your treatment.  Knee braces, cast boots, walking boots, crutches, etc. Are DME.  These items offer protection and provide for your safety.  The type and quality of DME has been prescribed for you by your provider.      We cannot determine how much of the cost of this product will be paid by your insurance carrier.  Therefore, you may receive a bill for the outstanding balance.  It is your responsibility to pay whatever fee your insurance carrier does not.    DME Return Policy:     Braces and boots are not returnable if work outside of clinic due to hygiene concerns.   Poorly fitting braces can be exchanged for a correct fit within 1 week if the DME is in excellent condition.   DME that was not dispensed by [facility] will not be accepted.    If DME must be returned, it must be returned to the office that dispensed it.   Special order braces are billed at the time of order and are non-refundable.   Brace parts can be ordered and replaced if they become damaged or worn out.  This may include a charge.    Your type of brace: Other - Tennis Elbow Brace    Patient Signature: __________________________  08/15/2013

## 2013-08-17 ENCOUNTER — Encounter: Payer: Self-pay | Admitting: Physical Medicine and Rehabilitation

## 2013-08-17 ENCOUNTER — Ambulatory Visit: Payer: Self-pay | Admitting: Physical Medicine and Rehabilitation

## 2013-08-17 VITALS — BP 127/75 | HR 71 | Ht 72.0 in | Wt 200.0 lb

## 2013-08-17 DIAGNOSIS — M542 Cervicalgia: Secondary | ICD-10-CM

## 2013-08-17 DIAGNOSIS — R251 Tremor, unspecified: Secondary | ICD-10-CM

## 2013-08-17 NOTE — Progress Notes (Signed)
[name] returns today for a followup visit.  Since last visit she's been seen by Dr. Venia [name] for left lateral epicondylitis and underwent cortisone injection yesterday.  He continues to have pain in the left elbow with numbness in the ulnar distribution.  He also has limited mobility of his neck with persistent pain despite trigger point injection performed by me last visit.    Patient reports that he was taking up to 4 gabapentin a day.  He fainted.  He is back down to 600 mg at bedtime.  Has not noticed a significant difference in symptoms.  He reports that he has poor balance and  Has been dropping his phone held in his right hand.  He is also concerned about tremor in both hands.    On focus neurological exam patient has no focal motor weakness in either upper or lower extremity.  He has brisk, symmetric reflexes in bilateral upper extremity, absent right ankle jerk.  2 beats of clonus left greater than right ankles.  Negative seated straight leg raise.    Patient with bradykinesia therapy and past pointing on finger to nose testing.   Rapid alternating movement slightly impaired.  Patient was able to heel to toe walk but was shaky.    Cervical MRI 2012    uncovertebral hypertrophy and degenerative changes only     Impression/plan: [name] has abnormal neurological exam of unclear etiology.  I recommending referral back to his neurologist for more thorough examination.  In the meantime I will obtain a new cervical MRI.  Followup after imaging.

## 2013-08-17 NOTE — Progress Notes (Signed)
[facility] TEST FORM    Patient name: [name]    MRN # :  [id number]    ESS:   10   NECK:  17 "      What is your usual bedtime?   9-10:00 p.m.  What is your usual risetime?  5-6:00 a.m.  What time did you wake up today? 5:00 a.m.  How many hours of sleep last night/day? 5-6 (FREQ AWAKENINGS) hours    Any naps taken today?   No    Any alcohol or caffeine intake since noon? No    Current Outpatient Prescriptions on File Prior to Visit   Medication Sig Dispense Refill    amLODIPine (NORVASC) 5 MG tablet Take 1 tablet (5 mg total) by mouth daily  30 tablet  11    omeprazole (PRILOSEC) 20 MG capsule take 1 capsule by mouth twice a day  60 capsule  5    nitroglycerin (NITROSTAT) 0.4 MG SL tablet Place 1 tablet (0.4 mg total) under the tongue every 5 minutes as needed   May repeat 2 times then call 911 if pain persists.  25 tablet  5    sertraline (ZOLOFT) 100 MG tablet take 2 tablets by mouth once daily  60 tablet  6    gabapentin (NEURONTIN) 300 MG capsule Take 1 capsule (300 mg total) by mouth 4 times daily  120 capsule  2    metoprolol (LOPRESSOR) 25 MG tablet Take 12.5 mg by mouth 2 times daily        sertraline (ZOLOFT) 100 MG tablet Take 200 mg by mouth nightly        cyclobenzaprine (FLEXERIL) 10 MG tablet Take 1 tablet (10 mg total) by mouth 3 times daily as needed  60 tablet  5    lisinopril (PRINIVIL,ZESTRIL) 5 MG tablet Take 1 tablet (5 mg total) by mouth daily  30 tablet  11    atorvastatin (LIPITOR) 40 MG tablet Take 1 tablet (40 mg total) by mouth daily  30 tablet  11    isosorbide mononitrate (IMDUR) 30 MG 24 hr tablet Take 1 tablet (30 mg total) by mouth daily   Swallow whole. Do not crush, break, or chew.  30 tablet  11    aspirin 81 MG EC tablet Take 1 tablet (81 mg total) by mouth daily           No current facility-administered medications on file prior to visit.       Patient Active Problem List   Diagnosis Code    Anxiety Disorder NOS 300.00     Spondylolisthesis of lumbar region 738.4    Depression 311    Benign Prostatic Hypertrophy 600.00    Lumbar degenerative disc disease 722.52    Chest pain 786.50    Hyperlipidemia 272.4    HTN (hypertension) 401.9    CAD (coronary atherosclerotic disease) 414.00    Palpitations 785.1    Lateral epicondylitis  of elbow 726.32    GERD (gastroesophageal reflux disease) 530.81    Headache 784.0    Attention and concentration deficit 799.51    Difficulty swallowing 787.20    Fatigue 780.79       Sleep Diary Entries    1)   What was your approximate bedtime? 9:30 p.m.    2)   How long did it take you to fall asleep? 10 minutes  3)  How many times do you remember waking  up? 5     A) Approx. Time ? / How long were you awake?     10:30/  10 minutes    11:45/  5 minutes    1:15/  15 minutes    3:45/  30 minutes    4) What time did you finally wake up for the day?  6:45 a.m.    5)  Any problems or Comments?    CANULA MAY HAVE COME OFF A TIME OR TWO

## 2013-08-17 NOTE — Procedures (Addendum)
This is a report for a nocturnal, unattended home sleep test.    Home sleep tests include nasal flow monitors, pulse oximetry, snore microphone, respiratory effort sensors, and a body position sensor.  There is no EEG monitoring during this study, as such we are not able to discern between periods of sleep and wake. As such, home sleep testing tends to underestimate the actual severity of sleep disordered breathing.    This study is suggestive of a diagnosis of at least mild obstructive sleep apnea. His unattended apnea hypopnea index (uAHI) was 9.2 respiratory events per hour of recording time. His oxygen saturation nadir was 86%. Again, this study may have underestimated the actual severity of his sleep disordered breathing.    This study was electronically signed by Larene Pickett, MD on 08/19/2013 at 1:58 PM.

## 2013-08-19 ENCOUNTER — Encounter: Payer: Self-pay | Admitting: Neurology

## 2013-08-19 DIAGNOSIS — G4733 Obstructive sleep apnea (adult) (pediatric): Secondary | ICD-10-CM | POA: Insufficient documentation

## 2013-08-19 DIAGNOSIS — R5383 Other fatigue: Secondary | ICD-10-CM

## 2013-08-19 NOTE — Progress Notes (Signed)
[facility] Sleep Apnea Testing Report    Dear [name] underwent a nocturnal, unattended home sleep test on August 15, 2013.     Home sleep tests include nasal flow monitors, pulse oximetry, snore microphone, respiratory effort sensors, and a body position sensor.  There is no EEG monitoring during this study, as such we are not able to discern between periods of sleep and wake. As such, home sleep testing tends to underestimate the actual severity of sleep disordered breathing.    This study is suggestive of a diagnosis of at least mild obstructive sleep apnea. His unattended apnea hypopnea index (uAHI) was 9.2 respiratory events per hour of recording time. His oxygen saturation nadir was 86%. Again, this study may have underestimated the actual severity of his sleep disordered breathing.      IMPRESSION:   MILD OBSTRUCTIVE SLEEP APNEA:  This study is suggestive of a diagnosis of at least mild obstructive sleep apnea. His unattended apnea hypopnea index (uAHI) was 9.2 respiratory events per hour of recording time. This home sleep study may have underestimated the actual severity of his sleep disordered breathing.    Thanks for allowing me to participate in this patient's care.  Please feel free to contact me if you have any questions or would like for him to be seen in consultation in our office.    Sincerely,   Larene Pickett, MD  [facility]

## 2013-08-26 NOTE — Progress Notes (Signed)
PATIENTJERREL, [name] MR #:  [id number]   ACCOUNT #:  [id number] DOB:  1965-07-05   DICTATED BY:  Hurman Horn, MD DATE OF VISIT:  08/15/2013     Left elbow followup.     States, in general, he has been better after the 2nd surgery, recent exacerbation.  Questions whether there has been some subtle tendon irritation.  Interested in trying a corticosteroid injection.    EXAMINATION:  He has some epicondylitis findings in his exam.  Some weakness and some tenderness to palpation.  No range of motion deficits in the elbow.    PROCEDURE:  After sterile prep of the elbow, 2 cc of Celestone and 2 cc 1% lidocaine were injected into the extensor musculature.  He tolerated it well.    ASSESSMENT/PLAN:  Advised on a period of relative rest, ice, activity modification, and will contact us over the next 10-14 days with response to the injection.             ______________________________  Hurman Horn, MD    MM/MODL  DD:  08/26/2013 09:11:47  DT:  08/26/2013 14:29:17  Job #:  [id number]    cc: Hurman Horn, MD

## 2013-09-05 ENCOUNTER — Telehealth: Payer: Self-pay | Admitting: Cardiology

## 2013-09-05 NOTE — Telephone Encounter (Signed)
At last OV with Dr. Sydell Axon on 03/16/13, decision was made to discontinue Imdur.    I received a refill request from [facility] today.  I cannot get a hold of the patient as his voicemail is full, but will not plan to refill Imdur as patient was supposed to discontinue this previously.    Zyliah Schier E Enis Leatherwood, PA

## 2013-09-06 ENCOUNTER — Other Ambulatory Visit: Payer: Self-pay | Admitting: Physical Medicine and Rehabilitation

## 2013-09-16 ENCOUNTER — Ambulatory Visit: Payer: Self-pay | Admitting: Cardiology

## 2013-09-16 ENCOUNTER — Encounter: Payer: Self-pay | Admitting: Cardiology

## 2013-09-16 ENCOUNTER — Ambulatory Visit
Admit: 2013-09-16 | Discharge: 2013-09-16 | Disposition: A | Discharge status: Home / Self Care | Payer: Self-pay | Source: Ambulatory Visit | Attending: Cardiology | Admitting: Cardiology

## 2013-09-16 VITALS — BP 100/50 | HR 80 | Ht 72.0 in | Wt 197.0 lb

## 2013-09-16 DIAGNOSIS — I2510 Atherosclerotic heart disease of native coronary artery without angina pectoris: Secondary | ICD-10-CM

## 2013-09-16 DIAGNOSIS — R002 Palpitations: Secondary | ICD-10-CM

## 2013-09-16 DIAGNOSIS — I10 Essential (primary) hypertension: Secondary | ICD-10-CM
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
## 2013-09-21 ENCOUNTER — Encounter: Payer: Self-pay | Admitting: Physical Medicine and Rehabilitation

## 2013-09-21 ENCOUNTER — Ambulatory Visit: Payer: Self-pay | Admitting: Physical Medicine and Rehabilitation

## 2013-11-08 ENCOUNTER — Other Ambulatory Visit: Payer: Self-pay | Admitting: Primary Care

## 2013-11-14 ENCOUNTER — Other Ambulatory Visit: Payer: Self-pay | Admitting: Primary Care

## 2013-11-14 ENCOUNTER — Other Ambulatory Visit: Payer: Self-pay | Admitting: Physical Medicine and Rehabilitation

## 2013-11-14 ENCOUNTER — Other Ambulatory Visit: Payer: Self-pay

## 2013-11-14 DIAGNOSIS — M79602 Pain in left arm: Secondary | ICD-10-CM

## 2013-11-14 MED ORDER — CYCLOBENZAPRINE HCL 10 MG PO TABS *I*
10.0000 mg | ORAL_TABLET | Freq: Three times a day (TID) | ORAL | Status: DC | PRN
Start: 2013-11-14 — End: 2014-12-15

## 2013-11-18 ENCOUNTER — Ambulatory Visit: Payer: Self-pay | Admitting: Sports Medicine

## 2013-11-18 ENCOUNTER — Other Ambulatory Visit: Payer: Self-pay | Admitting: Physical Medicine and Rehabilitation

## 2013-11-18 MED ORDER — GABAPENTIN 300 MG PO CAPS
300.0000 mg | ORAL_CAPSULE | Freq: Four times a day (QID) | ORAL | Status: DC
Start: 2013-11-18 — End: 2014-03-21

## 2013-11-21 ENCOUNTER — Ambulatory Visit: Payer: Self-pay | Admitting: Sports Medicine

## 2013-11-21 ENCOUNTER — Encounter: Payer: Self-pay | Admitting: Sports Medicine

## 2013-11-21 ENCOUNTER — Ambulatory Visit: Payer: Self-pay | Admitting: Orthopedic Surgery

## 2013-11-21 VITALS — BP 127/60 | Ht 72.0 in | Wt 196.3 lb

## 2013-11-21 DIAGNOSIS — M7710 Lateral epicondylitis, unspecified elbow: Secondary | ICD-10-CM

## 2013-11-21 NOTE — Progress Notes (Addendum)
Orthopaedics Note    Patient: [name]  MRN: [id number]  Date of visit: 11/21/2013       Reason for visit:  Follow-up for   1. Lateral epicondylitis  of elbow          Interval history:   Returns today for f/u of L elbow lateral epicondylitis, had steroid injection at last visit which provided several weeks of improvement, but pain has returned. Not interested in any further interventions at this point, but is asking about a brace for the elbow. Has tried tennis elbow unloading brace with some relief in the past. Still complains of diffuse tingling in the hands bilaterally, now also complaining of R hand tremors. Continues to do his PT for his L elbow on his own.       Patient's medications, allergies, past medical, surgical, social and family histories were reviewed and updated as appropriate.    Review of systems:  Pertinent positives/negatives: see interval history above  Otherwise negative for 12 system review      Physical Exam:  Filed Vitals:    11/21/13 0935   BP: 127/60   Height: 1.829 m (6')   Weight: 89.041 kg (196 lb 4.8 oz)       Normal appearance, well nourished, awake, alert, oriented, no acute distress, cooperative, comfortable  Pleasant mood and affect  Body habitus normal  L elbow: TTP directly over the lateral epicondyle, pain with resisted wrist extension. Incision benign, well healed. Full elbow ROM in flexion/extension and pronation/supination.     Imaging: None     Assessment/Plan: 48 y.o. male, now sp multiple surgeries for L elbow lateral epicondylar debridement with continued laterally based symptoms.    1. Lateral epicondylitis  of elbow        Dispensed L elbow compression sleeve and hinged elbow brace in clinic today. Will try this for several weeks and see how his symptoms progress, if he continues to be symptomatic then the next step would be EMG/NCS to workup possible radial nerve compression as an etiology of his laterally based forearm pain and hand parasthesias. Will call the  office to let us know how he would like to proceed.    Beaulah Corin, MD  10:18 AM  11/21/2013          I saw and evaluated the patient. I agree with the resident's/fellow's findings and plan of care as documented above.

## 2013-11-23 ENCOUNTER — Encounter: Payer: Self-pay | Admitting: Primary Care

## 2013-11-23 ENCOUNTER — Ambulatory Visit: Payer: Self-pay | Admitting: Primary Care

## 2013-11-23 ENCOUNTER — Ambulatory Visit
Admit: 2013-11-23 | Discharge: 2013-11-23 | Disposition: A | Discharge status: Home / Self Care | Payer: Self-pay | Source: Ambulatory Visit | Attending: Primary Care | Admitting: Primary Care

## 2013-11-23 VITALS — BP 112/80 | HR 69 | Ht 72.0 in | Wt 198.0 lb

## 2013-11-23 DIAGNOSIS — I10 Essential (primary) hypertension: Secondary | ICD-10-CM

## 2013-11-23 DIAGNOSIS — E7800 Pure hypercholesterolemia, unspecified: Secondary | ICD-10-CM

## 2013-11-23 DIAGNOSIS — F419 Anxiety disorder, unspecified: Secondary | ICD-10-CM

## 2013-11-23 DIAGNOSIS — I2510 Atherosclerotic heart disease of native coronary artery without angina pectoris: Secondary | ICD-10-CM

## 2013-11-23 LAB — LIPID PANEL
Chol/HDL Ratio: 4.2
Cholesterol: 121 mg/dL
HDL: 29 mg/dL
LDL Calculated: 60 mg/dL
Non HDL Cholesterol: 92 mg/dL
Triglycerides: 162 mg/dL — AB

## 2013-11-23 LAB — COMPREHENSIVE METABOLIC PANEL
ALT: 27 U/L (ref 0–50)
AST: 23 U/L (ref 0–50)
Albumin: 4.8 g/dL (ref 3.5–5.2)
Alk Phos: 75 U/L (ref 40–130)
Anion Gap: 13 (ref 7–16)
Bilirubin,Total: <0.2 mg/dL (ref 0.0–1.2)
CO2: 25 mmol/L (ref 20–28)
Calcium: 8.9 mg/dL (ref 8.6–10.2)
Chloride: 105 mmol/L (ref 96–108)
Creatinine: 0.86 mg/dL (ref 0.67–1.17)
GFR,Black: 118 *
GFR,Caucasian: 102 *
Glucose: 102 mg/dL — ABNORMAL HIGH (ref 60–99)
Lab: 23 mg/dL — ABNORMAL HIGH (ref 6–20)
Potassium: 4.5 mmol/L (ref 3.3–5.1)
Sodium: 143 mmol/L (ref 133–145)
Total Protein: 6.9 g/dL (ref 6.3–7.7)

## 2013-11-23 MED ORDER — DULOXETINE HCL 30 MG PO CPEP *I*
30.0000 mg | DELAYED_RELEASE_CAPSULE | Freq: Two times a day (BID) | ORAL | Status: DC
Start: 2013-11-23 — End: 2014-05-25

## 2013-11-23 NOTE — Progress Notes (Signed)
Unable to get thru to patient on his cell phone. Spoke with spouse on her cell phone and she will relay message to him. MMorris LPN

## 2013-11-23 NOTE — Progress Notes (Signed)
S:  [name] returns to the office for follow up of his hypertension, hypercholesterolemia, CAD and anxiety disorder.    He continues with his usual medications.  He did reduce his dose of the gabapentin as we had discussed at his last appointment.  He has had no further dizziness, fatigue or fainting spells since making this change.  He denies any problems with headaches, vision changes, shortness of breath, heartburn, nausea, diarrhea, constipation, bleeding problems or fatigue.  He continues to experience episodes of chest pain about once every week or 2.  His symptoms are typically relieved with the use of the Nitrostat although it does trigger him to experience painful headaches.  He is not in the habit of checking his blood pressures at home.    He continues to struggle with pain around his right elbow.  The symptoms have diminished since his surgery with Dr. Venia Minks although they have not fully resolved.  He has to be cautious with his use of his left arm and hand in order to avoid triggering his symptoms.  He has been using a hinged brace on the left arm with some temporary benefit.    He notes that he had a flare up of low back pain yesterday for no explainable reason.  He took his cyclobenzaprine as well as ibuprofen for his symptoms.  His back is feeling much better today.    He otherwise notes that his anxiety symptoms are not adequately managed.  He is prone to experiencing anxiety attacks in public places, such as a store, especially if it is crowded or busy.  He does not seem to be getting adequate relief from the sertraline and would like to consider further options.    He notes that he has a disability hearing coming up in September.  He also notes that he completed his blood work this morning.  The results are not yet available at this time.    O:  General: Alert, appropriate, pleasant man in no apparent distress.  Neck: Supple, no lymphadenopathy, no thyromegaly, 2+ carotid pulses  bilaterally.  Heart: Regular rate and rhythm, no murmur.  Lungs: Clear to auscultation bilaterally.  Abdomen: Positive bowel sounds, soft, nontender, nondistended, no masses, no organomegaly.  Extremities: 2+ radial and 2+ posterior tibial pulses bilaterally.  No clubbing, cyanosis or edema.  MSK: Mild to moderate tenderness surrounding the left elbow.  No crepitance or instability with passive range of motion.  No erythema, edema, induration, warmth, ecchymosis or effusion.  Psych: Neurovegetative signs and symptoms as described above.    A:  1.  Essential hypertension -- well controlled  2.  Hypercholesterolemia -- well controlled on last blood work  3.  CAD -- intermittent angina  4.  Anxiety disorder -- not adequately managed    P:  1.  Discontinue the sertraline since not gaining adequate benefit.  2.  Initiate Cymbalta 30 mg twice daily.  We discussed an appropriate transition schedule.  3.  Continue with other medications as previously prescribed.  4.  Continue to maintain efforts on prudent diet, exercise and weight management.  5.  RTO in 6 weeks for follow up of anxiety disorder or sooner if any other problems or concerns.

## 2013-11-28 ENCOUNTER — Ambulatory Visit: Payer: Self-pay | Admitting: Primary Care

## 2013-12-21 ENCOUNTER — Telehealth: Payer: Self-pay

## 2013-12-21 ENCOUNTER — Encounter: Payer: Self-pay | Admitting: Sleep Medicine

## 2013-12-21 ENCOUNTER — Ambulatory Visit: Payer: Self-pay | Admitting: Sleep Medicine

## 2013-12-21 VITALS — BP 119/56 | HR 68 | Ht 72.0 in | Wt 195.0 lb

## 2013-12-21 DIAGNOSIS — G4733 Obstructive sleep apnea (adult) (pediatric): Secondary | ICD-10-CM

## 2013-12-21 MED ORDER — CPAP MACHINE *A*
Status: DC
Start: 2013-12-21 — End: 2015-05-02

## 2013-12-21 NOTE — Telephone Encounter (Signed)
Script for a permanent AutoSet @ 5.0-18.0 cm and information packet faxed to [employer].
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
## 2013-12-21 NOTE — Patient Instructions (Signed)
Obstructive Sleep Apnea    Obstructive sleep apnea (OSA) is a very common condition affecting 4-8% of American adults.  It is caused by the back of the throat blocking off during sleep leading either to a complete stoppage of breathing or a reduction in airflow.  These breathing disturbances then lead to a poor quality sleep although the person with OSA may have no idea that these events are occuring.  Almost everyone with OSA snores, although just because a person snore does not mean that they have OSA.    Since OSA can lead to a poor quality sleep, one of the main symptoms is excessive sleepiness.  It can also negatively impact memory, concentration, and mood just like a lack of sleep can.  In addition to these effects on how a person feels, it may also affect your health.  It can contribute to high blood pressure, Diabetes, and high cholesterol. It  also increases risk for heart disease, irregular heart beat, and stroke.    One of the biggest dangers of OSA, or any condition that makes a person tired is drowsiness with driving.  A drowsy driver is impaired to the same degree as a person that is legally drunk.  People with sleep apnea may have a 2-4 fold increased risk for a car accident.  If you are driving and start feeling sleepy, you need to pull over and take a break (perhaps even drink a cafeinated beverage if necessary).  Measures like rolling down the window or turning up the radio are not effective and will not help you to arrive at your destination safely.    There are a number of factors that can increase the risk for sleep apnea.  Obesity is a common factor, therefore if a person is overweight and has OSA then weight loss can sometimes be curative.  Other factors include family history, enlarged tonsils, nose and jaw structure, as well as alcohol and cigarette use.

## 2013-12-21 NOTE — Telephone Encounter (Signed)
[facility]         CPAP HomeCare Authorization    Patient Information:    [name]  1965-11-11  [address]  [place] [address]  [phone] (home) [phone] (work)    CPAP Vendor:  [facility] - [phone])    Titration Date:       Follow Up Appt: 02/16/14    Type:   HOME    MVP   [phone]    Info Faxed to:   [facility] - ([phone])    Eula Listen

## 2013-12-27 ENCOUNTER — Encounter: Payer: Self-pay | Admitting: Gastroenterology

## 2013-12-29 ENCOUNTER — Encounter: Payer: Self-pay | Admitting: Physical Medicine and Rehabilitation

## 2013-12-29 ENCOUNTER — Ambulatory Visit: Payer: Self-pay | Admitting: Physical Medicine and Rehabilitation

## 2013-12-29 VITALS — BP 131/69 | HR 90 | Ht 72.0 in | Wt 195.0 lb

## 2013-12-29 DIAGNOSIS — M5412 Radiculopathy, cervical region: Secondary | ICD-10-CM

## 2013-12-29 NOTE — Progress Notes (Signed)
[name] is here today for a followup visit.  He is taking gabapentin 900 mg daily.  He continues to have hand tremors, neck pain and left upper extremity pain.  He also complains of poor balance.  He has not been seen by neurology.    Patient has neurological symptoms of unclear etiology.  Doubt that he has any significant cervical spine pathology.  Recommend referral to neurology.

## 2013-12-29 NOTE — Telephone Encounter (Signed)
Set up date 12/29/13  C pap auto @ 5-18  F&P [facility]  CFU 02/16/14

## 2014-01-12 NOTE — Telephone Encounter (Signed)
Follow up call. Recording stated the home number [phone] not in service. I tried the mobile # [phone] and was told I had a wrong number by the person answering.

## 2014-01-13 ENCOUNTER — Encounter: Payer: Self-pay | Admitting: Primary Care

## 2014-01-13 ENCOUNTER — Ambulatory Visit: Payer: Self-pay | Admitting: Primary Care

## 2014-01-13 VITALS — BP 102/70 | HR 75 | Ht 72.01 in | Wt 199.6 lb

## 2014-01-13 DIAGNOSIS — I10 Essential (primary) hypertension: Secondary | ICD-10-CM

## 2014-01-13 DIAGNOSIS — M545 Low back pain, unspecified: Secondary | ICD-10-CM

## 2014-01-13 DIAGNOSIS — E7800 Pure hypercholesterolemia, unspecified: Secondary | ICD-10-CM

## 2014-01-13 DIAGNOSIS — F419 Anxiety disorder, unspecified: Secondary | ICD-10-CM

## 2014-01-13 DIAGNOSIS — I2510 Atherosclerotic heart disease of native coronary artery without angina pectoris: Secondary | ICD-10-CM

## 2014-01-13 DIAGNOSIS — G8929 Other chronic pain: Secondary | ICD-10-CM

## 2014-01-15 NOTE — Progress Notes (Signed)
S:  [name] returns to the office for follow up of his hypertension, anxiety disorder, CAD and chronic low back pain.    He did initiate the Cymbalta as we had discussed at his last appointment.  He reports that he has noticed some meaningful benefit from this medication but he continues to have some challenges with his anxiety nonetheless.  He notes that his father is currently at [facility] being treated for his end stage renal disease.  This has been a source of stress for him during the past couple of weeks and is provoking his anxiety.  His sleep patterns have been fluctuant as a result.  His energy level is fluctuant, his appetite is okay and he does find some enjoyment in his life.  He has difficulty concentrating when his anxiety is elevated.  He experiences feelings of guilt regarding his struggles.  He denies any problems with coordination as well as any suicidal or homicidal ideation.    He otherwise continues with his usual medications.  He denies any problems with headaches, dizziness, vision changes, shortness of breath, heartburn, nausea, diarrhea, constipation, bleeding problems or fatigue.  He continues to experience episodes of chest pain about once every week or 2.  His symptoms are typically relieved with the use of the Nitrostat although it does trigger him to experience painful headaches.  He is not in the habit of checking his blood pressures at home.    He continues to struggle with pain around his right elbow.  He is currently using a hinged elbow brace with some partial benefit.  He has to be cautious with his use of his left arm and hand in order to avoid triggering his symptoms.    He continues to struggle with his low back pain on a regular basis.  He is very limited in his ability to undertake physical task work since he can easily provoke his symptoms.  He takes cyclobenzaprine and ibuprofen on an intermittent basis when his symptoms are present.  He denies any recent  numbness, tingling or weakness in his legs or feet.    His disability hearing is coming up in September.    O:  General: Alert, appropriate, pleasant man in no apparent distress.  Neck: Supple, no lymphadenopathy, no thyromegaly, 2+ carotid pulses bilaterally.  Heart: Regular rate and rhythm, no murmur.  Lungs: Clear to auscultation bilaterally.  Abdomen: Positive bowel sounds, soft, nontender, nondistended, no masses, no organomegaly.  Extremities: 2+ radial and 2+ posterior tibial pulses bilaterally.  No clubbing, cyanosis or edema.  MSK: Mild to moderate tenderness surrounding the left elbow.  No crepitance or instability with passive range of motion.  No erythema, edema, induration, warmth, ecchymosis or effusion.  Mild tenderness over the perispinous muscles of the lumbar spine.  Forward flexion back to 60 from vertical and extension to 15.  Lateral flexion to 20 and shoulder rotation to 80 bilaterally.  Neuro: No sensory or motor deficits of the upper or lower extremities.  Brisk, uniform upper and lower extremity reflexes bilaterally.  5/5 muscle strength in the flexors and extensors of the shoulders, elbows, hips and knees bilaterally.  Psych: Neurovegetative signs and symptoms as described above.    A:  1.  Essential hypertension -- well controlled  2.  Anxiety disorder -- satisfactory management  3.  CAD -- intermittent angina  4.  Chronic low back pain -- satisfactory management    P:  1.  Continue with current medications at their current  doses.  2.  Continue to maintain efforts on prudent diet, exercise and weight management.  3.  Patient provided disability forms today that he needs completed in preparation for his disability hearing.  These outline his physical and emotional limitations.  We reviewed and discussed them during today's appointment.  They will be completed in the near future.  4.  RTO in 4-5 months for follow up of cholesterol or sooner if any other problems or concerns.  5.   Complete blood work prior to the next appointment to check an FLP and CMP.

## 2014-01-24 ENCOUNTER — Telehealth: Payer: Self-pay

## 2014-01-24 NOTE — Telephone Encounter (Signed)
Attempted to contact patient to schedule appointment per work queue request. All contact numbers listed for the patient are disconnected.

## 2014-01-25 ENCOUNTER — Other Ambulatory Visit: Payer: Self-pay | Admitting: Cardiology

## 2014-01-31 ENCOUNTER — Telehealth: Payer: Self-pay | Admitting: Primary Care

## 2014-01-31 NOTE — Telephone Encounter (Signed)
Patient called asking if his disability paperwork has been completed

## 2014-02-03 NOTE — Telephone Encounter (Signed)
Needed by 02/06/14

## 2014-02-06 ENCOUNTER — Other Ambulatory Visit: Payer: Self-pay | Admitting: Primary Care

## 2014-02-13 ENCOUNTER — Ambulatory Visit: Payer: Self-pay | Admitting: Sports Medicine

## 2014-02-16 ENCOUNTER — Encounter: Payer: Self-pay | Admitting: Sleep Medicine

## 2014-02-16 ENCOUNTER — Ambulatory Visit: Payer: Self-pay | Admitting: Sleep Medicine

## 2014-02-16 VITALS — Ht 72.0 in | Wt 198.0 lb

## 2014-02-16 DIAGNOSIS — G4733 Obstructive sleep apnea (adult) (pediatric): Secondary | ICD-10-CM

## 2014-02-16 NOTE — Progress Notes (Signed)
I had the pleasure of seeing [name] on February 16, 2014 and counseled her for 20 minutes regarding treatment of his sleep apnea.  We had assisted him in acquiring an auto titrating CPAP unit and he seems very content with it.  His wife is happy that he no longer snores and he feels that he sleeps better and breathes better with CPAP.  Download of usage data indicates that he is used it every night for the past month, 25 of those nights (83%) for more than 4 hours, averaging 6 hours and 23 minutes per night.  He has virtually no leakage and only isolated residual events.  I congratulated him on his excellent compliance and encouraged him to continue using CPAP and to feel free to contact us with any concerns or questions.  We will be happy to see him again as needed.    Thank you for allowing [place] to participate in the care of your patient.      Sincerely,       Renard Hamper, MD  [place]

## 2014-02-17 ENCOUNTER — Encounter: Payer: Self-pay | Admitting: Sports Medicine

## 2014-02-17 ENCOUNTER — Ambulatory Visit: Payer: Self-pay | Admitting: Sports Medicine

## 2014-02-17 VITALS — BP 124/76 | Ht 72.0 in | Wt 199.2 lb

## 2014-02-17 DIAGNOSIS — M7710 Lateral epicondylitis, unspecified elbow: Secondary | ICD-10-CM

## 2014-02-17 NOTE — Progress Notes (Signed)
This office note has been dictated.

## 2014-02-17 NOTE — Progress Notes (Signed)
PATIENTQUAY, [name] MR #:  [id number]   ACCOUNT #:  [id number] DOB:  [birth year]   DICTATED BY:  Rosie Fate, MD,RES DATE OF VISIT:  02/17/2014     CHIEF COMPLAINT:  Lateral epicondylitis of the elbow.    INTERVAL HISTORY:  The patient returns today for continued evaluation of his left elbow.  He remains unable to work.  He last saw [place] on 11/22/2013.  He says that he complains of left elbow pain both on the medial and lateral sides, as well as over the biceps tendon with numbness and paresthesias that tract on the dorsal aspect of his hand.  He has weakness with this.  He has tried gabapentin, nonsteroidal anti-inflammatories, as well as the compression sleeve and hinged brace that we prescribed the last time he was here.  He continues to be symptomatic.    PHYSICAL EXAM:  Focused examination of the left upper extremity demonstrates tenderness to palpation of the medial and lateral epicondyles of the humerus.  He has tenderness over the biceps tendon.  He has decreased sensation in the radial nerve distribution.  He is able to perform thumb extension, finger abduction and index finger flexion.  Otherwise all digits are warm and well perfused.    ASSESSMENT/PLAN:  The patient is a 48 year old male with refractory elbow pain and paresthesias.  I would recommend a referral to Dr. Jamesetta Orleans for EMG and nerve conduction studies to determine if there is a radial compression pathology stemming from his elbow, which can be explaining his symptoms.  We will see him back in follow up after this is completed per the recommendations of Dr. Tonye Becket.  All questions were answered.       Dictated By:  Rosie Fate, MD,RES    I saw and evaluated the patient. I agree with the resident's/fellow's findings and plan of care as documented above.      ______________________________  Hurman Horn, MD    MM/MODL  DD:  02/17/2014 09:30:47  DT:  02/17/2014 12:00:59  Job #:  [id number]    cc: Hurman Horn, MD

## 2014-02-22 ENCOUNTER — Encounter: Payer: Self-pay | Admitting: Gastroenterology

## 2014-02-23 NOTE — Telephone Encounter (Signed)
CFU note faxed to [facility].

## 2014-02-24 ENCOUNTER — Encounter: Payer: Self-pay | Admitting: Primary Care

## 2014-02-24 ENCOUNTER — Ambulatory Visit: Payer: Self-pay | Admitting: Primary Care

## 2014-02-24 VITALS — BP 102/80 | HR 74 | Ht 72.01 in | Wt 200.4 lb

## 2014-02-24 DIAGNOSIS — J029 Acute pharyngitis, unspecified: Secondary | ICD-10-CM

## 2014-02-24 DIAGNOSIS — H6982 Other specified disorders of Eustachian tube, left ear: Secondary | ICD-10-CM

## 2014-02-24 LAB — POCT AMBULATORY RAPID STREP
Lot #: 151042
Rapid Strep Group A Throat-POC: NEGATIVE

## 2014-02-24 MED ORDER — FLUTICASONE PROPIONATE 50 MCG/ACT NA SUSP *I*
NASAL | Status: DC
Start: 2014-02-24 — End: 2017-03-30

## 2014-02-25 NOTE — Progress Notes (Signed)
S:  [name] presents to the office with complaints of a sore throat for the past 8 days.    He has also been aware of pressure in his ears, left greater than right.  He has had a mildly stuffy nose, runny nose, left eye pain and pressure, as well as a dry cough that is present in the mornings only.  He notes that he has had some hoarseness and could barely talk last weekend.  This has since diminished.  He denies any headaches, dizziness, wheezing, shortness of breath, heartburn, nausea, vomiting, diarrhea, fevers, chills or sweats.  He has taken Advil Cold and Sinus for his symptoms with some temporary benefit.  He is not a smoker.    He continues with his usual medications.    O:  General: Alert, appropriate, pleasant man in no apparent distress.  HEENT: Sclera and conjunctiva clear, TMs WNL.  Nasal mucosa with mild erythema and edema but no mucus congestion.  Mild peritonsillar erythema but no tonsillar enlargement or exudates.  No frontal or maxillary sinus tenderness.  Neck: Supple, no lymphadenopathy, no thyromegaly, 2+ carotid pulses bilaterally.  Heart: Regular rate and rhythm, no murmur.  Lungs: Clear to auscultation bilaterally.  Abdomen: Positive bowel sounds, soft, nontender, nondistended, no masses, no organomegaly.  Extremities: 2+ radial and 2+ posterior tibial pulses bilaterally.  No clubbing, cyanosis or edema.    Rapid strep test performed in the office today was negative.    A:  1.  Pharyngitis  2.  Eustachian tube dysfunction, left    P:  1.  Recommended OTCs, drink adequate fluids, vitamins, rest, salt water gargles and vaporizer at bedside at night as needed for management of symptoms.  2.  Initiate fluticasone suspension, 50 mcg/attenuation, 2 sprays to each nostril once daily.  3.  Continue with other medications as previously prescribed.  4.  RTO in 2 weeks if no improvement in symptoms or sooner if symptoms worsen.

## 2014-03-21 ENCOUNTER — Other Ambulatory Visit: Payer: Self-pay | Admitting: Physical Medicine and Rehabilitation

## 2014-04-05 ENCOUNTER — Encounter: Payer: Self-pay | Admitting: Gastroenterology

## 2014-04-05 ENCOUNTER — Ambulatory Visit: Payer: Self-pay | Admitting: Physical Medicine and Rehabilitation

## 2014-04-05 ENCOUNTER — Encounter: Payer: Self-pay | Admitting: Physical Medicine and Rehabilitation

## 2014-04-05 VITALS — BP 137/69 | HR 72 | Ht 72.0 in | Wt 201.0 lb

## 2014-04-05 DIAGNOSIS — M79632 Pain in left forearm: Secondary | ICD-10-CM

## 2014-04-05 NOTE — Progress Notes (Signed)
The patient was referred and  evaluated with an electrodiagnostic examination. The electrodiagnostic report is located in the Procedures or Media tab  of eRecord.

## 2014-04-07 ENCOUNTER — Ambulatory Visit: Payer: Self-pay | Admitting: Sports Medicine

## 2014-04-07 ENCOUNTER — Encounter: Payer: Self-pay | Admitting: Sports Medicine

## 2014-04-07 VITALS — BP 123/66 | Ht 72.0 in | Wt 203.0 lb

## 2014-04-07 DIAGNOSIS — M79632 Pain in left forearm: Secondary | ICD-10-CM

## 2014-04-07 MED ORDER — DICLOFENAC SODIUM 1 % EX GEL *I*
Freq: Four times a day (QID) | CUTANEOUS | Status: DC
Start: 2014-04-07 — End: 2016-07-22

## 2014-04-07 NOTE — Progress Notes (Signed)
[id number]

## 2014-04-07 NOTE — Progress Notes (Signed)
[name], HUBBERT MR #:  [id number]   ACCOUNT #:  [id number] DOB:  1965-11-04   DICTATED BY:  Sheryle Hail, PA DATE OF VISIT:  04/07/2014     CHIEF COMPLAINT:  Followup left forearm.    INTERVAL HISTORY:  The patient has a long history of lateral epicondylitis.  He underwent 2 open procedures and describes pain into his elbow as well as into his forearm and some numbness and weakness into his hand.  New EMGs were obtained by Dr. Tonye Becket and he is here today for further followup.    PHYSICAL EXAMINATION:  Shows a polite, cooperative gentleman.  Well-preserved motion.  Tender at the lateral epicondyle but more tender along the forearm area.     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  Dr. Venia Minks did see and evaluate the patient.  Discussed going forth with Voltaren gel as he has tried other conservative measurements.  He will follow up with [place] on a p.r.n. basis.       Dictated By:  Sheryle Hail, PA      ______________________________  Hurman Horn, MD    ML/MODL  DD:  04/07/2014 09:41:36  DT:  04/07/2014 13:04:31  Job #:  [id number]    cc: Hurman Horn, MD

## 2014-04-12 NOTE — Telephone Encounter (Signed)
A compliance report from [place] for 03/13/14 - 04/11/14 shows pt used his C pap greater than 4 hours a day 24 out of 30 days (80%). He used his C pap less than 4 hours a day 6 out of 30 days (20%). Average of days used = 5 hours 58 minutes. Total use = 178 hours 52 minutes. Detailed report to be scanned into E records.

## 2014-04-18 NOTE — Progress Notes (Signed)
PATIENTJUNIUS, [name] MR #:  [id number]   ACCOUNT #:  [id number] DOB:  Sep 08, 1965   DICTATED BY:  Hurman Horn, MD DATE OF VISIT:  04/07/2014     ADDENDUM:  This will serve as an addendum to the PA note.    INTERVAL HISTORY:  He did have the nerve conduction studies performed and completed by Dr. Rosana Hoes Speech.  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  Remaining examination is normal, with no definite evidence for cervical radiculopathy, brachial plexopathy, radial or posterior interosseous neuropathy.    ASSESSMENT AND PLAN:  I have encouraged him to continue with symptomatic measures, I do not feel there is anything further to pursue surgically.  Hopefully, with more time, his strength and endurance will improve.  I did review the findings with him.  He understands.  Questions invited and answered to his satisfaction.  He will follow up with [place] over the next 2 or 3 months' time.  No change in his physical examination today.             ______________________________  Hurman Horn, MD    MM/MODL  DD:  04/17/2014 08:54:01  DT:  04/17/2014 14:02:40  Job #:  [id number]    cc: Hurman Horn, MD

## 2014-04-19 ENCOUNTER — Encounter: Payer: Self-pay | Admitting: Gastroenterology

## 2014-05-16 ENCOUNTER — Ambulatory Visit
Admit: 2014-05-16 | Discharge: 2014-05-16 | Disposition: A | Discharge status: Home / Self Care | Payer: Self-pay | Source: Ambulatory Visit | Attending: Primary Care | Admitting: Primary Care

## 2014-05-16 DIAGNOSIS — I10 Essential (primary) hypertension: Secondary | ICD-10-CM

## 2014-05-16 DIAGNOSIS — E7800 Pure hypercholesterolemia, unspecified: Secondary | ICD-10-CM

## 2014-05-16 LAB — COMPREHENSIVE METABOLIC PANEL
ALT: 43 U/L (ref 0–50)
AST: 35 U/L (ref 0–50)
Albumin: 5 g/dL (ref 3.5–5.2)
Alk Phos: 81 U/L (ref 40–130)
Anion Gap: 13 (ref 7–16)
Bilirubin,Total: 0.4 mg/dL (ref 0.0–1.2)
CO2: 26 mmol/L (ref 20–28)
Calcium: 9.4 mg/dL (ref 8.6–10.2)
Chloride: 102 mmol/L (ref 96–108)
Creatinine: 1 mg/dL (ref 0.67–1.17)
GFR,Black: 101 *
GFR,Caucasian: 88 *
Glucose: 106 mg/dL — ABNORMAL HIGH (ref 60–99)
Lab: 19 mg/dL (ref 6–20)
Potassium: 5 mmol/L (ref 3.3–5.1)
Sodium: 141 mmol/L (ref 133–145)
Total Protein: 7.4 g/dL (ref 6.3–7.7)

## 2014-05-16 LAB — LIPID PANEL
Chol/HDL Ratio: 3.5
Cholesterol: 127 mg/dL
HDL: 36 mg/dL
LDL Calculated: 69 mg/dL
Non HDL Cholesterol: 91 mg/dL
Triglycerides: 108 mg/dL

## 2014-05-19 ENCOUNTER — Encounter: Payer: Self-pay | Admitting: Primary Care

## 2014-05-19 ENCOUNTER — Ambulatory Visit: Payer: Self-pay | Admitting: Primary Care

## 2014-05-19 VITALS — BP 120/84 | Ht 72.0 in | Wt 204.8 lb

## 2014-05-19 DIAGNOSIS — F411 Generalized anxiety disorder: Secondary | ICD-10-CM

## 2014-05-19 DIAGNOSIS — E7800 Pure hypercholesterolemia, unspecified: Secondary | ICD-10-CM

## 2014-05-19 DIAGNOSIS — I25110 Atherosclerotic heart disease of native coronary artery with unstable angina pectoris: Secondary | ICD-10-CM

## 2014-05-19 DIAGNOSIS — I10 Essential (primary) hypertension: Secondary | ICD-10-CM

## 2014-05-19 NOTE — Progress Notes (Signed)
S:  [name] returns to the office for follow up of his hypertension, hypercholesterolemia, CAD and anxiety disorder.    He continues with his usual medications including metoprolol, lisinopril, amlodipine, atorvastatin, omeprazole, gabapentin, cyclobenzaprine, Cymbalta, Voltaren gel, co-Q10, baby aspirin, Nitrostat and fluticasone nasal spray.  He denies any problems with headaches, dizziness, vision changes, shortness of breath, heartburn, nausea, diarrhea, constipation, bleeding problems or fatigue.  He continues to experience episodes of chest pain about once every week or 2.  His symptoms are typically relieved with the use of the Nitrostat although it does trigger him to experience painful headaches.  He is doing his best to watch his eating habits and stay active although he does not have a regular exercise routine.  He is not in the habit of checking his blood pressures at home.    His sleep patterns continue to be fluctuant.  His energy level is fluctuant, his appetite is okay and he does find some enjoyment in his life.  He has difficulty concentrating when his anxiety is elevated.  He experiences feelings of guilt regarding his struggles.  He denies any problems with coordination as well as any suicidal or homicidal ideation.    Unfortunately he reports that his father passed away 2 months ago.    O:  General: Alert, appropriate, pleasant man in no apparent distress.  Neck: Supple, no lymphadenopathy, no thyromegaly, 2+ carotid pulses bilaterally.  Heart: Regular rate and rhythm, no murmur.  Lungs: Clear to auscultation bilaterally.  Abdomen: Positive bowel sounds, soft, nontender, nondistended, no masses, no organomegaly.  Extremities: 2+ radial and 2+ posterior tibial pulses bilaterally.  No clubbing, cyanosis or edema.  Psych: Neurovegetative signs and symptoms as described above.    Hospital Outpatient Visit on 05/16/2014   Component Date Value Ref Range Status    Cholesterol 05/16/2014 127    Final    Comment: REFERENCE RANGE:  < 200 Desirable                  200-239 Borderline High                    > 240 High      Triglycerides 05/16/2014 108   Final    Comment: REFERENCE RANGE:  < 150 Normal                  150-199 Borderline High                  200-499 High                    > 500 Very High      HDL 05/16/2014 36   Final    Comment: REFERENCE RANGE:  < 40 Low                    > 60 High      LDL Calculated 05/16/2014 69   Final    Comment: REFERENCE RANGE:  < 100 Optimal                  100-129 Near or above optimal                  130-159 Borderline High                  160-189 High                    >  189 Very High      Non HDL Cholesterol 05/16/2014 91   Final    Target is 67m/dl above(or over) LDL goal    Chol/HDL Ratio 05/16/2014 3.5   Final    Sodium 05/16/2014 141  133 - 145 mmol/L Final    Potassium 05/16/2014 5.0  3.3 - 5.1 mmol/L Final    Chloride 05/16/2014 102  96 - 108 mmol/L Final    CO2 05/16/2014 26  20 - 28 mmol/L Final    Anion Gap 05/16/2014 13  7 - 16 Final    UN 05/16/2014 19  6 - 20 mg/dL Final    Creatinine 05/16/2014 1.00  0.67 - 1.17 mg/dL Final    GFR,Caucasian 05/16/2014 88   Final    GFR,Black 05/16/2014 101   Final    *UNITS=mL/min/1.73 square meters    Glucose 05/16/2014 106* 60 - 99 mg/dL Final    Comment: Reference Ranges apply only to FASTING samples.    ADA Guidelines Blood Sugar Levels for Diagnosing Diabetes & Pre-diabetes  Normal: < 100 mg/dL  Impaired Fasting Glucose (IFG): 100-125 mg/dL  Diabetes:  > 126 mg/dL on two different occasions      Calcium 05/16/2014 9.4  8.6 - 10.2 mg/dL Final    Total Protein 05/16/2014 7.4  6.3 - 7.7 g/dL Final    Albumin 05/16/2014 5.0  3.5 - 5.2 g/dL Final    Bilirubin,Total 05/16/2014 0.4  0.0 - 1.2 mg/dL Final    AST 05/16/2014 35  0 - 50 U/L Final    ALT 05/16/2014 43  0 - 50 U/L Final    Alk Phos 05/16/2014 81  40 - 130 U/L Final       A:  1.  Essential hypertension -- well controlled  2.   Hypercholesterolemia -- satisfactory control  3.  CAD -- intermittent angina  4.  Anxiety disorder -- satisfactory management    P:  1.  Continue with current medications at their current doses.  2.  Continue to maintain efforts on prudent diet, exercise and weight management.  3.  Patient was given a seasonal influenza immunization in the office today.  4.  RTO in 6 months for follow up of the above problems or sooner if any other problems or concerns.  5.  Complete blood work prior to the next appointment to check an FLP and CMP.

## 2014-05-25 ENCOUNTER — Other Ambulatory Visit: Payer: Self-pay | Admitting: Primary Care

## 2014-06-01 ENCOUNTER — Other Ambulatory Visit: Payer: Self-pay | Admitting: Primary Care

## 2014-06-02 ENCOUNTER — Encounter: Payer: Self-pay | Admitting: Sports Medicine

## 2014-06-02 ENCOUNTER — Ambulatory Visit: Payer: Self-pay | Admitting: Sports Medicine

## 2014-06-02 ENCOUNTER — Ambulatory Visit: Payer: Self-pay | Admitting: Orthopedic Surgery

## 2014-06-02 VITALS — BP 121/65 | Ht 72.0 in | Wt 208.0 lb

## 2014-06-02 DIAGNOSIS — M79632 Pain in left forearm: Secondary | ICD-10-CM

## 2014-06-02 NOTE — Progress Notes (Signed)
[id number]

## 2014-06-02 NOTE — Progress Notes (Signed)
[name], [name] MR #:  [id number]   ACCOUNT #:  [id number] DOB:  June 28, 1965   DICTATED BY:  Sheryle Hail, PA DATE OF VISIT:  06/02/2014     CHIEF COMPLAINT:  Followup left forearm.    INTERVAL HISTORY:  The patient presents here today stating that he has not experienced any great improvement since he was seen here 2 months ago.  He does understand that there is not much further intervention to offer him.  He remains sore into the forearm area with some weakness into his hand.    ASSESSMENT/PLAN:  He will continue with the home exercise program.  Dr. Venia Minks did see and evaluate the patient.  He will follow up p.r.n.       Dictated By:  Sheryle Hail, PA      ______________________________  Hurman Horn, MD    ML/MODL  DD:  06/02/2014 08:27:25  DT:  06/02/2014 14:21:00  Job #:  [id number]    cc: Hurman Horn, MD

## 2014-06-05 NOTE — Progress Notes (Signed)
PATIENTDARLY, FAILS MR #:  [id number]   ACCOUNT #:  [id number] DOB:  [birth year]   DICTATED BY:  Hurman Horn, MD DATE OF VISIT:  06/02/2014     Left arm followup.  No real change.  He states the symptoms are about the same.  He has come to the realization this is something that he likely will just have to live with.  We discussed at length the continuation of efforts at functional modifications, symptomatic measures with Voltaren gel, stretching and strengthening, and follow up with [place] from here on an as-needed basis.    EXAMINATION:  There is no change today.             ______________________________  Hurman Horn, MD    MM/MODL  DD:  06/05/2014 07:16:33  DT:  06/05/2014 10:31:19  Job #:  [id number]    cc: Hurman Horn, MD

## 2014-07-19 ENCOUNTER — Other Ambulatory Visit: Payer: Self-pay | Admitting: Primary Care

## 2014-08-14 ENCOUNTER — Other Ambulatory Visit: Payer: Self-pay | Admitting: Cardiology

## 2014-08-16 ENCOUNTER — Ambulatory Visit: Payer: Self-pay | Admitting: Primary Care

## 2014-08-16 ENCOUNTER — Encounter: Payer: Self-pay | Admitting: Primary Care

## 2014-08-16 VITALS — BP 110/68 | Ht 72.0 in | Wt 209.4 lb

## 2014-08-16 DIAGNOSIS — R109 Unspecified abdominal pain: Secondary | ICD-10-CM

## 2014-08-16 DIAGNOSIS — M5442 Lumbago with sciatica, left side: Secondary | ICD-10-CM

## 2014-08-16 DIAGNOSIS — M5441 Lumbago with sciatica, right side: Secondary | ICD-10-CM

## 2014-08-16 LAB — POCT URINALYSIS DIPSTICK
Bilirubin,Ur: NEGATIVE
Blood,UA POCT: NEGATIVE
Glucose,UA POCT: NORMAL
Ketones,UA POCT: NEGATIVE
Leuk Esterase,UA POCT: NEGATIVE
Lot #: 23302001
Nitrite,UA POCT: NEGATIVE
PH,UA POCT: 7 (ref 5–8)
Protein,UA POCT: NEGATIVE mg/dL
Specific gravity,UA POCT: 1.005 (ref 1.002–1.03)
Urobilinogen,UA: NORMAL

## 2014-08-16 MED ORDER — METAXALONE 800 MG PO TABS *I*
800.0000 mg | ORAL_TABLET | Freq: Three times a day (TID) | ORAL | Status: DC
Start: 2014-08-16 — End: 2015-04-26

## 2014-08-18 ENCOUNTER — Encounter: Payer: Self-pay | Admitting: Primary Care

## 2014-08-18 ENCOUNTER — Ambulatory Visit: Payer: Self-pay | Admitting: Primary Care

## 2014-08-18 ENCOUNTER — Telehealth: Payer: Self-pay | Admitting: Primary Care

## 2014-08-18 VITALS — BP 110/64 | Ht 72.0 in | Wt 209.0 lb

## 2014-08-18 DIAGNOSIS — M5442 Lumbago with sciatica, left side: Secondary | ICD-10-CM

## 2014-08-18 MED ORDER — OXYCODONE HCL 10 MG PO TABS *I*
ORAL_TABLET | ORAL | Status: DC
Start: 2014-08-18 — End: 2014-10-17

## 2014-08-18 NOTE — Telephone Encounter (Signed)
Pain clinic is requesting chart note from 3/2 visit before they can schedule patients appointment

## 2014-08-19 NOTE — Progress Notes (Signed)
S:  [name] returns to the office for follow up of his recent low back pain.    He did initiate the Skelaxin as I had prescribed at his last appointment.  He has not noticed any meaningful benefit from the use of this medication so far.  He has been using heat with some temporary benefit.  He has been taking ibuprofen with minimal benefit.  He has been doing some gentle stretching exercises with no noticeable benefit.    He notes that he was getting out of his car at about 1:30 p.m. this afternoon when he felt a pop in his low back followed by an abrupt flare of his back pain.  The pain has been worse since that time.  He feels that he needs something stronger for pain relief or he will not be able to sleep.    The pain is present continuously although it waxes and wanes in severity.  The pain radiates to his left leg.  The pain is worse with increased physical activity and improves when laying down.  He is aware of some dullness and tingling in his left leg as well as a burning sensation in the toes of his left foot.    He tried to schedule an appointment with pain specialist, Dr. Lynnette Caffey as we had discussed at his last appointment.  He was told that Dr. Lynnette Caffey is no longer in the practice and there would be a 3 month wait for an appointment.  He would therefore like to consider further options.    O:  General: Alert, pleasant, uncomfortable appearing man in no apparent distress.  Neck: Supple, no lymphadenopathy, no thyromegaly, 2+ carotid pulses bilaterally.  Heart: Regular rate and rhythm, no murmur.  Lungs: Clear to auscultation bilaterally.  Abdomen: Positive bowel sounds, soft, nontender, nondistended, no masses, no organomegaly.  Extremities: 2+ radial and 2+ posterior tibial pulses bilaterally.  No clubbing, cyanosis or edema.  MSK: Mild to moderate tenderness and palpable muscle tension over the perispinous muscles of the lumbar spine.  Anterior flexion of the back to 30 from vertical and extension to  5.  Lateral flexion to 20 and shoulder rotation to 60 bilaterally.  Neuro: Dull sensation over the anterior and lateral surfaces of the left thigh.  Dull sensation over all of the toes of the left foot.  No motor deficits.  Brisk, uniform patellar tendon and Achilles tendon reflexes bilaterally.  5/5 muscle strength in the flexors and extensors of the hips, knees and ankles bilaterally.    A:  Low back pain -- worse    P:  1.  Initiate oxycodone 10 mg, 1-2 tablets every 6 hours as needed for pain.  2.  Continue with the Skelaxin as needed.  3.  Continue with gentle stretching exercises and moist heat.  4.  Refer to pain specialist, Dr. Earnstine Regal for further evaluation and management of the low back pain.  5.  Refer for physical therapy for further evaluation and management of the low back pain.  6.  RTO as needed if any further problems or concerns in regards to the above.

## 2014-08-20 NOTE — Progress Notes (Signed)
S:  Mr. Mangiaracina presents to the office with complaints of low back pain for the past 3 days.    He denies any recent fall, strain or other injury that could have triggered his symptoms.  He notes that he contends with mild intermittent low back pain on a regular basis.  He has for about 20 years.  He will experience occasional flare ups of his symptoms that will last for a few days to a week at a time.  His current symptoms are consistent with this.  His past medical history is notable for a lumbar disc herniation in 2001.    The pain is present continuously across his low back, L > R.  The pain radiates to his left leg.  He notes burning and tingling sensations on the anterior and posterior surfaces of his left thigh as well as each of the toes on his left foot.  The pain is worse with increased physical activity and improves when laying down.    He has a prescription for cyclobenzaprine at home.  He tried taking it with no noticeable benefit.  He has tried some heat with minimal benefit.    O:  General: Alert, pleasant, uncomfortable appearing man in no apparent distress.  Neck: Supple, no lymphadenopathy, no thyromegaly, 2+ carotid pulses bilaterally.  Heart: Regular rate and rhythm, no murmur.  Lungs: Clear to auscultation bilaterally.  Abdomen: Positive bowel sounds, soft, nontender, nondistended, no masses, no organomegaly.  Extremities: 2+ radial and 2+ posterior tibial pulses bilaterally. No clubbing, cyanosis or edema.  MSK: Mild to moderate tenderness and palpable muscle tension over the perispinous muscles of the lumbar spine. Anterior flexion of the back to 30 from vertical and extension to 5. Lateral flexion to 20 and shoulder rotation to 60 bilaterally.  Neuro: Dull sensation over the anterior and posterior surfaces of the left thigh. Dull sensation over all of the toes of the left foot. No motor deficits. Brisk, uniform patellar tendon and Achilles tendon reflexes bilaterally. 5/5 muscle  strength in the flexors and extensors of the hips, knees and ankles bilaterally.    A:  Low back pain -- acute on chronic    P:  1. Refer for x-rays of the lumbar spine to evaluate for evidence of DJD, DDD, spondylosis, spondylolisthesis and neural foraminal stenosis.  2. Initiate Skelaxin 800 mg 3 times daily as needed for muscle tension or spasms.  3. Recommended gentle stretching exercises and moist heat.  4.  Avoid stress or strain to the low back while symptoms persist.  5. Refer to pain specialist, Dr. Gifford Shave for further evaluation and management of the low back pain.  6. Recommended a referral for physical therapy but the patient does not feel that he can afford this.  7. RTO as needed if any further problems or concerns in regards to the above.

## 2014-08-21 ENCOUNTER — Ambulatory Visit: Payer: Self-pay | Admitting: Cardiology

## 2014-08-21 NOTE — Telephone Encounter (Signed)
Notes from 3/2 and 08/18/14 are done.  Please process accordingly.  I have changed the choice of pain management office if this makes a difference.

## 2014-08-25 ENCOUNTER — Other Ambulatory Visit: Payer: Self-pay | Admitting: Gastroenterology

## 2014-08-25 ENCOUNTER — Ambulatory Visit
Admit: 2014-08-25 | Discharge: 2014-08-25 | Disposition: A | Discharge status: Home / Self Care | Payer: Self-pay | Source: Ambulatory Visit | Attending: Cardiology | Admitting: Cardiology

## 2014-08-25 ENCOUNTER — Encounter: Payer: Self-pay | Admitting: Cardiology

## 2014-08-25 ENCOUNTER — Ambulatory Visit: Payer: Self-pay | Admitting: Cardiology

## 2014-08-25 VITALS — BP 96/64 | HR 62 | Ht 72.0 in | Wt 204.0 lb

## 2014-08-25 DIAGNOSIS — I10 Essential (primary) hypertension: Secondary | ICD-10-CM

## 2014-08-25 DIAGNOSIS — I25118 Atherosclerotic heart disease of native coronary artery with other forms of angina pectoris: Secondary | ICD-10-CM

## 2014-08-25 DIAGNOSIS — R079 Chest pain, unspecified: Secondary | ICD-10-CM

## 2014-08-25 DIAGNOSIS — R002 Palpitations: Secondary | ICD-10-CM

## 2014-08-25 DIAGNOSIS — E785 Hyperlipidemia, unspecified: Secondary | ICD-10-CM
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
## 2014-08-29 LAB — EKG 12-LEAD
P: 39 degrees
QRS: 2 degrees
Rate: 62 {beats}/min
Severity: NORMAL
Severity: NORMAL
Statement: NORMAL
T: 37 degrees

## 2014-09-12 ENCOUNTER — Other Ambulatory Visit: Payer: Self-pay | Admitting: Physical Medicine and Rehabilitation

## 2014-09-13 ENCOUNTER — Other Ambulatory Visit: Payer: Self-pay | Admitting: Physical Medicine and Rehabilitation

## 2014-09-13 DIAGNOSIS — M5412 Radiculopathy, cervical region: Secondary | ICD-10-CM

## 2014-09-25 ENCOUNTER — Other Ambulatory Visit: Payer: Self-pay | Admitting: Primary Care

## 2014-10-09 ENCOUNTER — Encounter: Payer: Self-pay | Admitting: Gastroenterology

## 2014-10-17 ENCOUNTER — Ambulatory Visit: Payer: Self-pay | Admitting: Pain Medicine

## 2014-10-17 ENCOUNTER — Encounter: Payer: Self-pay | Admitting: Pain Medicine

## 2014-10-17 VITALS — BP 120/67 | HR 88 | Temp 97.5°F | Resp 16 | Ht 72.0 in | Wt 205.0 lb

## 2014-10-17 DIAGNOSIS — M5417 Radiculopathy, lumbosacral region: Secondary | ICD-10-CM

## 2014-10-17 MED ORDER — MELOXICAM 7.5 MG PO TABS *I*
7.5000 mg | ORAL_TABLET | Freq: Every day | ORAL | Status: DC
Start: 2014-10-17 — End: 2014-11-02
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
## 2014-10-17 NOTE — Patient Instructions (Addendum)
Recommend starting meloxicam 7.5 mg Take one tablet daily.  Do not take with other NSAIDS (Ibuprofen, naproxen).  Take with food.  Discontinue Ibuprofen    Continue rest of medications

## 2014-10-30 ENCOUNTER — Telehealth: Payer: Self-pay | Admitting: Pain Medicine

## 2014-10-30 NOTE — Telephone Encounter (Signed)
Patient requesting the results of his MRI.

## 2014-10-30 NOTE — Telephone Encounter (Addendum)
Spoke with patient, reviewed results of patient's MRI lumbar spine showing spondylosis with degenerative disc and facet disease. No significant stenosis of spinal or neural foramina. Will discuss with Dr. Olin Pia and schedule patient for planned procedure.

## 2014-10-31 ENCOUNTER — Encounter: Payer: Self-pay | Admitting: Primary Care

## 2014-10-31 ENCOUNTER — Ambulatory Visit: Payer: Self-pay | Admitting: Primary Care

## 2014-10-31 VITALS — BP 120/84 | Ht 72.0 in | Wt 206.8 lb

## 2014-10-31 DIAGNOSIS — Z23 Encounter for immunization: Secondary | ICD-10-CM

## 2014-10-31 DIAGNOSIS — Z139 Encounter for screening, unspecified: Secondary | ICD-10-CM

## 2014-10-31 DIAGNOSIS — G8929 Other chronic pain: Secondary | ICD-10-CM

## 2014-10-31 DIAGNOSIS — Z1211 Encounter for screening for malignant neoplasm of colon: Secondary | ICD-10-CM

## 2014-10-31 DIAGNOSIS — Z0000 Encounter for general adult medical examination without abnormal findings: Secondary | ICD-10-CM

## 2014-10-31 DIAGNOSIS — M545 Low back pain, unspecified: Secondary | ICD-10-CM

## 2014-10-31 LAB — POCT URINALYSIS DIPSTICK
Bilirubin,Ur: NEGATIVE
Blood,UA POCT: NEGATIVE
Glucose,UA POCT: NORMAL
Ketones,UA POCT: NEGATIVE
Leuk Esterase,UA POCT: NEGATIVE
Lot #: 23302001
Nitrite,UA POCT: NEGATIVE
PH,UA POCT: 7 (ref 5–8)
Protein,UA POCT: NEGATIVE mg/dL
Specific gravity,UA POCT: 1.005 (ref 1.002–1.03)
Urobilinogen,UA: NORMAL

## 2014-10-31 LAB — PCMH DEPRESSION ASSESSMENT

## 2014-10-31 MED ORDER — CELECOXIB 200 MG PO CAPS *I*
200.0000 mg | ORAL_CAPSULE | Freq: Two times a day (BID) | ORAL | Status: DC
Start: 2014-10-31 — End: 2015-02-16

## 2014-11-01 ENCOUNTER — Ambulatory Visit
Admit: 2014-11-01 | Discharge: 2014-11-01 | Disposition: A | Discharge status: Home / Self Care | Payer: Self-pay | Source: Ambulatory Visit | Attending: Primary Care | Admitting: Primary Care

## 2014-11-01 ENCOUNTER — Telehealth: Payer: Self-pay | Admitting: Pain Medicine

## 2014-11-01 DIAGNOSIS — I10 Essential (primary) hypertension: Secondary | ICD-10-CM

## 2014-11-01 DIAGNOSIS — E7800 Pure hypercholesterolemia, unspecified: Secondary | ICD-10-CM

## 2014-11-01 LAB — COMPREHENSIVE METABOLIC PANEL
ALT: 32 U/L (ref 0–50)
AST: 27 U/L (ref 0–50)
Albumin: 4.9 g/dL (ref 3.5–5.2)
Alk Phos: 85 U/L (ref 40–130)
Anion Gap: 13 (ref 7–16)
Bilirubin,Total: 0.2 mg/dL (ref 0.0–1.2)
CO2: 26 mmol/L (ref 20–28)
Calcium: 9.4 mg/dL (ref 8.6–10.2)
Chloride: 103 mmol/L (ref 96–108)
Creatinine: 0.96 mg/dL (ref 0.67–1.17)
GFR,Black: 106 *
GFR,Caucasian: 92 *
Glucose: 113 mg/dL — ABNORMAL HIGH (ref 60–99)
Lab: 19 mg/dL (ref 6–20)
Potassium: 5.2 mmol/L — ABNORMAL HIGH (ref 3.3–5.1)
Sodium: 142 mmol/L (ref 133–145)
Total Protein: 7 g/dL (ref 6.3–7.7)

## 2014-11-01 LAB — LIPID PANEL
Chol/HDL Ratio: 4.2
Cholesterol: 122 mg/dL
HDL: 29 mg/dL
LDL Calculated: 61 mg/dL
Non HDL Cholesterol: 93 mg/dL
Triglycerides: 162 mg/dL — AB

## 2014-11-01 NOTE — Telephone Encounter (Signed)
As per Dr. Olin Pia, have pt scheduled for Left L3-5 MBBX2. Pt called and scheduled accordingly.

## 2014-11-02 NOTE — Progress Notes (Signed)
S:  [name] returns to the office for a full physical.    He notes that he was recently prescribed meloxicam for his musculoskeletal pains.  He has experienced a rash and itching associated with the use of this medication.  He would like to try Celebrex.      Current Outpatient Prescriptions   Medication    metaxalone (SKELAXIN) 800 MG tablet    amLODIPine (NORVASC) 5 MG tablet    omeprazole (PRILOSEC) 20 MG capsule    metoprolol (LOPRESSOR) 25 MG tablet    DULoxetine (CYMBALTA) 30 MG DR capsule    diclofenac (VOLTAREN) 1 % gel    gabapentin (NEURONTIN) 300 MG capsule    fluticasone (FLONASE) 50 MCG/ACT nasal spray    lisinopril (PRINIVIL,ZESTRIL) 5 MG tablet    atorvastatin (LIPITOR) 40 MG tablet    CPAP machine    co-enzyme Q-10 (CO Q10) 100 MG capsule    cyclobenzaprine (FLEXERIL) 10 MG tablet    nitroglycerin (NITROSTAT) 0.4 MG SL tablet    aspirin 81 MG EC tablet    celecoxib (CELEBREX) 200 MG capsule     No current facility-administered medications for this visit.       Allergies:  Tylenol, Percocet, latex    PMH:  1.  Essential hypertension   2.  Hypercholesterolemia   3.  CAD, unstable angina, MI -- 2012  4.  GERD  5.  Sleep apnea  6.  Anxiety disorder   7.  Depressive disorder   8.  Sleep disorder   9.  Chronic low back pain, h/o disk herniation -- 1991, 1993   10.  BPH   11.  h/o Recurrent prostatitis  12.  h/o Pneumonia -- 2009    PSH:  1.  s/p Left lateral epicondyle extensor tendon debridement and repair x 2 -- 09/21/12, 02/15/13  2.  s/p PTCA and stent to the LAD -- 04/04/11  3.  h/o Right hand fractures x 3 -- 1986, 1994, 2001   4.  s/p Vasectomy -- 1997   5.  s/p Right tarsal tunnel release -- 1996   6.  h/o Right ankle fracture -- 1995    Family health history:   Patient has 3 sons who are in good health.  He has a sister with alcoholism, depression and anxiety.  He has a brother with alcoholism.  He has another sister and another brother who are in good health.  His father has a  history of heart disease and stroke.  His mother has a history of type 2 diabetes, hypercholesterolemia and an anxiety disorder.  A maternal aunt died in her 6s from a heart attack.  Another maternal aunt died in her 72s from heart disease.  Another maternal aunt died at age 87 from heart disease.  Another maternal aunt died in her 74s from an unspecified cancer.  Another maternal aunt died in her 82s from heart disease.  A maternal uncle died in his 64s of unknown cause with a history of alcoholism.  He has 2 other maternal aunts and a maternal uncle whom he believes are in good health.  Two paternal uncles and 1 paternal aunt each died in their 78I from complications related to COPD.  Another paternal aunt died in her 38s from heart disease.  Another paternal aunt died at unknown age of unknown cause.  Another paternal aunt died at age 57 from lung cancer with a history of COPD.  A paternal uncle died at age 98 from  prostate cancer.  His paternal grandfather died in his 13s of unknown cause.  His paternal grandmother died in her 56s from heart disease.    Social history:   Patient lives with his wife and 1 of his sons.  They have a dog.  His wife is the only smoker.  He is disabled from work due to his chronic musculoskeletal pain, CAD and unstable angina.  He previously worked as an Therapist, sports and day care provider.  He enjoys family activities.  He was a 1 ppd smoker for 25 years and quit in 2012.  He drinks 1 alcoholic beverage per year.  He denies any history of illicit substance use.  He drinks caffeine beverages only occasionally.    Review of systems:   Patient denies any history of cardiac, gastrointestinal, renal, neurologic, dermatologic, hematologic or endocrine disorders.  He has never undergone a colonoscopy.  Review of systems otherwise unremarkable except as previously noted.    Physical exam:   General: Alert, appropriate, pleasant man in no apparent distress.   HEENT: PERRLA, EOMI, sclera and conjunctiva  clear, TMs WNL, MMM, oropharynx negative, good dentition.   Neck: Supple, no lymphadenopathy, no thyromegaly, 2+ carotid pulses bilaterally.   Heart: Regular rate and rhythm, no murmur.   Lungs: Clear to auscultation bilaterally.   Abdomen: Positive bowel sounds, soft, nontender, nondistended, no masses, no organomegaly.   Extremities: 2+ radial and 2+ posterior tibial pulses bilaterally. No clubbing, cyanosis or edema.   Neuro: CN II-XII intact.  No sensory or motor deficits.  Brisk, uniform upper and lower extremity reflexes bilaterally.  5/5 muscle strength in the flexors and extensors of the shoulders, elbows, hips and knees.   MSK: Patient exhibits full range of motion of the neck, back, shoulders, elbows, hips and knees without any limitation, dysfunction or discomfort.   Skin: No pathologic abnormalities identified.   Genital exam: Normal circumcised male anatomy, testicles WNL, no inguinal hernias.   Rectal exam: Normal sphincter tone, prostate firm, smooth and nontender, no masses, guaiac negative.    See blood work from 05/16/14.    Assessment:   49 year old man with hypertension, hypercholesterolemia, CAD, GERD, sleep apnea, anxiety disorder, depressive disorder, sleep disorder, chronic low back pain, BPH and a 25 pack year smoking history.    Plan:   1.  Last tetanus booster was 2001 so patient was given a Boostrix (Tdap) in the office today.  Seasonal influenza immunization 05/19/14.   2.  Instructed patient regarding testicle self examination.   3.  Counseled patient regarding prudent diet, exercise and weight management.   4.  Discontinue further use of the meloxicam since not well tolerated.    5.  Initiate Celebrex 200 mg once daily.    6.  Continue with other medications as previously prescribed.   7.  Recommended routine follow up with a dentist and cardiologist.   8.  Refer to gastroenterologist, Dr. Milinda Pointer for a screening colonoscopy.  9.  Complete blood work in the near future to check an FLP and  CMP.  10.  RTO in 6 months for follow up of hypertension and hypercholesterolemia or sooner if any other problems or concerns.

## 2014-11-10 ENCOUNTER — Encounter: Payer: Self-pay | Admitting: Pain Medicine

## 2014-11-10 ENCOUNTER — Ambulatory Visit: Payer: Self-pay | Admitting: Pain Medicine

## 2014-11-10 VITALS — BP 105/61 | HR 55 | Temp 97.7°F | Resp 18 | Ht 72.0 in | Wt 207.0 lb

## 2014-11-10 DIAGNOSIS — M5417 Radiculopathy, lumbosacral region: Secondary | ICD-10-CM

## 2014-11-10 DIAGNOSIS — M47819 Spondylosis without myelopathy or radiculopathy, site unspecified: Secondary | ICD-10-CM

## 2014-11-10 NOTE — Progress Notes (Signed)
Patient was examined prior to this procedure.      Change in health status: No      Interval change in structure, distribution quality of pain: No      Interval Change:   Medication Allergies:  Yes   Contrast Dye Allergies: No   Latex Allergies:  Yes   Current Antibiotics:  No   Current Blood Thinners: No   Recent Fevers/Chills:  No   History of Diabetes:  No   Current Steroids:  No   NPO:    Yes          Plan:    Left L3-5 MBB                I saw and evaluated the patient with the above trainee and agree with the findings, assessment and plan.     [place] Grayer, M.D.  Assistant Professor  Department of Anesthesiology

## 2014-11-10 NOTE — Procedures (Signed)
LUMBAR MEDIAL BRANCH BLOCKS: left at L3/4/5    11/10/2014  [name], [id number]    PROCEDURE DIAGNOSIS:  Facetogenic Pain    PROCEDURALIST:  [place] Grayer, MD  ASSISTANT: Oletta Lamas, MD       This patient has had long-standing axial low back pain that has been refractory to conservative treatments.  .    After risks and benefits were explained a signed consent was obtained. All questions were answered.     The anticipated injection site was marked and initialed by Dr. Olin Pia.  The injection site was sterilely prepped and draped.  Final verification ("time out") was performed by Dr Olin Pia and the assistants to ensure the correct patient identity, site and agreement on the procedure to be done.    The patient was brought into the fluoroscopy suite and placed prone on the fluoroscopy table.  A pillow was placed under the patient's abdomen to flatten the lumbar lordosis.  Chlorhexidine skin prep was performed and the patient was sterilely draped prior to the procedure.  With the aid of a fluoroscope the lumbosacral skeletal anatomy was visualized.  The medial branches were targeted via a posterior oblique approach.  Skin and subcutaneous tissue along the expected needle tracts were anesthetized with 2 Ml's of 1% lidocaine using a 27-gauge 1.5 inch needle.  Then 22-gauge 3.5" needles were used to target the medial branches.  The L5 medial branch was targeted in the fossa formed by the superior articular process and the sacral ala.  The remaining medial branches were targeted in the fossa formed by the superior articular process and transverse process for each of the the targeted levels.  Appropriate needle tip position was confirmed in multiple fluoroscopic views.  Aspirate was confirmed negative for CSF and heme.  At each level 0.5 mL of 0.5% bupivacaine was injected.  The stylets were replaced and the needles and removed.  The procedure was well tolerated.    Following the procedure, the patient was given a diary so they  can document the efficacy and duration of today's diagnostic injection.    I, Dr. Olin Pia, was present through out the procedure.

## 2014-11-10 NOTE — Patient Instructions (Signed)
[facility]  Facet Joint Injection  Discharge Instructions        Your doctor has given you a facet joint injection (s).      [x] You have received a diagnostic facet injection (s).  This means you have received numbing medicine only at the site(s) of your pain.  This will wear off over the next 1 to 12 hours.  It is important that you keep a pain diary to help us know how this injection has helped you.  .    After the injection you may notice the following:    1. Increased tenderness in the area of your usual pain  2. Mild leg weakness    These effects are temporary and will diminish over the next several days.    For injection site tenderness, apply an ice pack to the injection site for a 20 minute period, then remove for one hour.  Repeat as necessary.    Please notify the Pain Treatment Center if you notice:    1. Redness, swelling, or drainage from the injection sites, or have a fever.  2. Prolonged leg weakness (lasting more than 3 days)    Activity:  You may carry out your regular daily activities, avoid doing things that you have not done for some time (example: heavy housework or lifting etc).  It is very important you continue your regular exercise program.  If you have received any medications to help you relax or for pain, you should not drive, operate heavy machinery or make major decisions for the next 12 hours.    Please contact a nurse at the Pain Treatment Center at Dept: [phone] should you have any questions or concerns regarding your treatment.      I have received and understand the above instructions.    Patient signature:  ______________________________ Date:  _____  Time: _____    Nurse signature:    ______________________________ Date:  _____  Time: _____

## 2014-11-14 ENCOUNTER — Telehealth: Payer: Self-pay

## 2014-11-14 NOTE — Telephone Encounter (Signed)
Called patient 3 day post left L3,4,5 mbb injection. Patient states pain was < 50% for 3 hours after discharge. Pain was 3 until 10:30, increase to 4/10 at 1135am. Patient injection site is benign. Patient does have a 2nd mbb scheduled for November 17 2014 with Dr. Olin Pia.

## 2014-11-15 ENCOUNTER — Other Ambulatory Visit: Payer: Self-pay | Admitting: Primary Care

## 2014-11-17 ENCOUNTER — Ambulatory Visit: Payer: Self-pay | Admitting: Pain Medicine

## 2014-11-17 ENCOUNTER — Encounter: Payer: Self-pay | Admitting: Pain Medicine

## 2014-11-17 ENCOUNTER — Ambulatory Visit: Payer: Self-pay | Admitting: Primary Care

## 2014-11-17 VITALS — BP 115/67 | HR 66 | Temp 97.3°F | Resp 18 | Ht 72.0 in | Wt 206.0 lb

## 2014-11-17 DIAGNOSIS — M47819 Spondylosis without myelopathy or radiculopathy, site unspecified: Secondary | ICD-10-CM

## 2014-11-17 NOTE — Progress Notes (Signed)
Patient was examined prior to this procedure.      Change in health status:No      Interval change in structure, distribution quality of pain:No      Interval Change:  Medication Allergies:Yes  Contrast Dye Allergies:No  Latex Allergies:Yes  Current Antibiotics:No  Current Blood Thinners:No  Recent Fevers/Chills:No  History of Diabetes:No  Current Steroids:No  NPO:Yes          Plan:    Left L3-5 MBB

## 2014-11-17 NOTE — Patient Instructions (Signed)
[facility]  Facet Joint Injection  Discharge Instructions        Your doctor has given you a facet joint injection (s).      [x] You have received a diagnostic facet injection (s).  This means you have received numbing medicine only at the site(s) of your pain.  This will wear off over the next 1 to 12 hours.  It is important that you keep a pain diary to help us know how this injection has helped you.  .    After the injection you may notice the following:    1. Increased tenderness in the area of your usual pain  2. Mild leg weakness    These effects are temporary and will diminish over the next several days.    For injection site tenderness, apply an ice pack to the injection site for a 20 minute period, then remove for one hour.  Repeat as necessary.    Please notify the [facility] if you notice:    1. Redness, swelling, or drainage from the injection sites, or have a fever.  2. Prolonged leg weakness (lasting more than 3 days)    Activity:  You may carry out your regular daily activities, avoid doing things that you have not done for some time (example: heavy housework or lifting etc).  It is very important you continue your regular exercise program.  If you have received any medications to help you relax or for pain, you should not drive, operate heavy machinery or make major decisions for the next 12 hours.    Please contact a nurse at the [facility] at Dept: [phone] should you have any questions or concerns regarding your treatment.      I have received and understand the above instructions.    Patient signature:  ______________________________ Date:  _____  Time: _____    Nurse signature:    ______________________________ Date:  _____  Time: _____

## 2014-11-17 NOTE — Procedures (Signed)
LUMBAR MEDIAL BRANCH BLOCKS: left at L3/4/5    11/17/2014  [name], [id number]    PROCEDURE DIAGNOSIS: Facetogenic Pain    PROCEDURALIST: [place] Grayer, MD    This patient has had long-standing axial low back pain that has   been refractory to conservative treatments. .    After risks and benefits were explained a signed consent was   obtained. All questions were answered.     The anticipated injection site was marked and initialed by Dr.   Olin Pia. The injection site was sterilely prepped and draped.   Final verification ("time out") was performed by Dr Olin Pia and the   assistants to ensure the correct patient identity, site and   agreement on the procedure to be done.    The patient was brought into the fluoroscopy suite and placed   prone on the fluoroscopy table. A pillow was placed under the   patient's abdomen to flatten the lumbar lordosis. Chlorhexidine   skin prep was performed and the patient was sterilely draped   prior to the procedure. With the aid of a fluoroscope the   lumbosacral skeletal anatomy was visualized. The medial branches   were targeted via a posterior oblique approach. Skin and   subcutaneous tissue along the expected needle tracts were   anesthetized with 2 Ml's of 1% lidocaine using a 27-gauge 1.5   inch needle. Then 22-gauge 3.5" needles were used to target the   medial branches. The L5 medial branch was targeted in the fossa   formed by the superior articular process and the sacral ala. The   remaining medial branches were targeted in the fossa formed by   the superior articular process and transverse process for each of   the the targeted levels. Appropriate needle tip position was   confirmed in multiple fluoroscopic views. Aspirate was confirmed   negative for CSF and heme. At each level 0.5 mL of 0.5%   bupivacaine was injected. The stylets were replaced and the   needles and removed. The procedure was well tolerated.    Following the procedure, the patient was  given a diary so they   can document the efficacy and duration of today's diagnostic   injection.    I, Dr. Olin Pia, was present through out the procedure.

## 2014-11-20 ENCOUNTER — Other Ambulatory Visit: Payer: Self-pay | Admitting: Primary Care

## 2014-11-20 ENCOUNTER — Telehealth: Payer: Self-pay

## 2014-11-20 DIAGNOSIS — H6982 Other specified disorders of Eustachian tube, left ear: Secondary | ICD-10-CM

## 2014-11-20 NOTE — Telephone Encounter (Signed)
Writer called [name] to see how he did following his MBB #2 which was done Friday in our clinic. His baseline pain was a 9/10, and when he left it was a 4/10. It stayed a 4/10 for 2 hours then back up to baseline so he has over 50% relief. W  His RFA is already scheduled. Will also send to T. Jeannene Patella for auth confirmation.

## 2014-11-21 ENCOUNTER — Ambulatory Visit: Payer: Self-pay | Admitting: Primary Care

## 2014-11-21 ENCOUNTER — Other Ambulatory Visit: Payer: Self-pay | Admitting: Primary Care

## 2014-11-21 ENCOUNTER — Encounter: Payer: Self-pay | Admitting: Primary Care

## 2014-11-21 ENCOUNTER — Telehealth: Payer: Self-pay | Admitting: Pain Medicine

## 2014-11-21 VITALS — BP 110/72 | Ht 72.0 in | Wt 206.0 lb

## 2014-11-21 DIAGNOSIS — F411 Generalized anxiety disorder: Secondary | ICD-10-CM

## 2014-11-21 DIAGNOSIS — M545 Low back pain, unspecified: Secondary | ICD-10-CM

## 2014-11-21 DIAGNOSIS — I25110 Atherosclerotic heart disease of native coronary artery with unstable angina pectoris: Secondary | ICD-10-CM

## 2014-11-21 DIAGNOSIS — G8929 Other chronic pain: Secondary | ICD-10-CM

## 2014-11-21 NOTE — Telephone Encounter (Signed)
Please do letter of medical necessity for RFA including % of relief obtained from both MBB's. MVP requires atleast 80% from each. Pt scheduled in error for 12/15/14 so I need to try and get auth ASAP.Thanks!

## 2014-11-21 NOTE — Telephone Encounter (Signed)
Letter of Medical Necessity:    [name] is being treated for lumbar facetogenic pain secondary to facet arthritis. Previous conservative treatments including medications and physical therapy were not effective in addressing this patients symptoms. The patient will be scheduled for a radiofrequency ablation of the left L3-L5 medial branch nervesgiven the lack of response to conservative treatments. This procedure is needed for the treatment of the patient's diagnosis of lumbar facet arthritis.     Previous medial branch diagnostic injections were performed on 11/10/14 and 11/17/14 which resulted in 90% relief for 10 hours after each set of injections. This indicates that the patient will respond well to a radiofrequency ablation. This will provide prolonged pain relief (6 months to one year) that will allow the patient to improve their functionality.    Cameron Sprang, MD  11/21/2014  4:10 PM

## 2014-11-22 ENCOUNTER — Other Ambulatory Visit: Payer: Self-pay | Admitting: Primary Care

## 2014-11-28 ENCOUNTER — Other Ambulatory Visit: Payer: Self-pay | Admitting: Cardiology

## 2014-11-28 MED ORDER — NITROGLYCERIN 0.4 MG SL SUBL *I*
0.4000 mg | SUBLINGUAL_TABLET | SUBLINGUAL | 5 refills | Status: DC | PRN
Start: 2014-11-28 — End: 2017-01-26

## 2014-12-10 NOTE — Progress Notes (Signed)
S:  [name] returns to the office for follow up of his chronic low back pain, CAD and anxiety disorder.    He is disabled from work as a result of these 3 problems.    He continues with his usual medications including metoprolol, lisinopril, amlodipine, atorvastatin, omeprazole, gabapentin, cyclobenzaprine, Cymbalta, Voltaren gel, co-Q10, baby aspirin, Nitrostat and fluticasone nasal spray.  He denies any problems with headaches, dizziness, vision changes, shortness of breath, heartburn, nausea, diarrhea, constipation, bleeding problems or fatigue.  He continues to experience episodes of chest pain about once every week or 2.  His symptoms are typically relieved with the use of the Nitrostat although it does trigger him to experience painful headaches.  He is doing his best to watch his eating habits and stay active although he does not have a regular exercise routine.  He is not in the habit of checking his blood pressures at home.    His sleep patterns continue to be fluctuant.  His energy level is fluctuant, his appetite is okay and he does find some enjoyment in his life.  He has difficulty concentrating when his anxiety is elevated.  He experiences feelings of guilt regarding his struggles.  He denies any problems with coordination as well as any suicidal or homicidal ideation.    His low back pain is present continuously although it waxes and wanes in severity.  The pain frequently radiates to his left leg.  The pain is worse with increased physical activity and improves when laying down.  He is aware of some dullness and tingling in his left leg as well as a burning sensation in the toes of his left foot.    O:  General: Alert, appropriate, pleasant man in no apparent distress.  Neck: Supple, no lymphadenopathy, no thyromegaly, 2+ carotid pulses bilaterally.  Heart: Regular rate and rhythm, no murmur.  Lungs: Clear to auscultation bilaterally.  Abdomen: Positive bowel sounds, soft, nontender,  nondistended, no masses, no organomegaly.  Extremities: 2+ radial and 2+ posterior tibial pulses bilaterally.  No clubbing, cyanosis or edema.  MSK: Mild tenderness and palpable muscle tension over the perispinous muscles of the lumbar spine.  Anterior flexion of the back to 60 from vertical and extension to 10.  Lateral flexion to 20 and shoulder rotation to 70 bilaterally.  Neuro: Dull sensation over the anterior and lateral surfaces of the left thigh.  Dull sensation over all of the toes of the left foot.  No motor deficits.  Brisk, uniform patellar tendon and Achilles tendon reflexes bilaterally.  5/5 muscle strength in the flexors and extensors of the hips, knees and ankles bilaterally.  Psych: Neurovegetative signs and symptoms as described above.    A:  1.  Chronic low back pain -- satisfactory management  2.  CAD -- satisfactory management  3.  Anxiety disorder -- satisfactory management    P:  1.  We reviewed, discussed and completed disability forms during today's appointment.  2.  Continue with current medications at their current doses.  3.  Continue with the home exercise program recommended by physical therapy.  4.  Continue appropriate follow up with pain specialist, Dr. Olin Pia.  5.  Continue appropriate follow up with cardiologist, Dr. Sydell Axon.  6.  RTO in 5 months as already scheduled or sooner if any other problems or concerns.

## 2014-12-15 ENCOUNTER — Encounter: Payer: Self-pay | Admitting: Pain Medicine

## 2014-12-15 ENCOUNTER — Ambulatory Visit: Payer: Self-pay | Admitting: Pain Medicine

## 2014-12-15 ENCOUNTER — Other Ambulatory Visit: Payer: Self-pay | Admitting: Primary Care

## 2014-12-15 VITALS — BP 102/61 | HR 61 | Temp 98.1°F | Resp 16 | Ht 72.0 in | Wt 204.0 lb

## 2014-12-15 DIAGNOSIS — M47819 Spondylosis without myelopathy or radiculopathy, site unspecified: Secondary | ICD-10-CM

## 2014-12-15 DIAGNOSIS — M79602 Pain in left arm: Secondary | ICD-10-CM

## 2014-12-15 NOTE — Patient Instructions (Signed)
[facility]  Radio-Frequency Ablation (RFA) Procedure  Discharge instructions      You have had a procedure called radiofrequency ablation.  This procedure uses radio waves directed to the nerve that may be contributing to your pain.    Watch for the following, and notify the office if you notice any of the following:    1. Infection: observe the incision/puncture site for signs of infection: Redness, Drainage, Warmth in the area, Fever, Swelling (mild swelling might be normal after this procedure)     2.  Pain: You may have some localized discomfort at the procedure site.  Apply an ice pack for 20 minutes on then one hour off may be helpful.      3.   Bleeding: There should only be a scant amount of bleeding during or after procedure.      4.  Numbness:  You may notice a small area of numbness in that particular area after the procedure, this is common.  Notify the office if you have numbness in any area other than the insertion site.    5.  Weakness:  Do not drive immediately following the procedure, and if you have received medication to sedate you, you should not drive or make any major decisions for at least 24 hours. Contact the office if you have persistent weakness in your arms or legs after procedure.    6.  Difficulty breathing:  If the procedure is done in the chest area, notify the office if you have any difficulty in breathing.    7. Difficulty in urinating or moving bowels:  If the procedure was done in the groin/pelvic area, notify the office if you have any difficulty in passing urine or bowel movements.    Activity:  You can resume your regular activity the day after the procedure. If you have received any medication to help you relax or for pain,  you should not drive, operate heavy machinery or make major decisions for the next 12 hours.    Please contact the [facility] Center at Dept: [phone] should you have any questions or concerns.      I have  received and understand the above information.    Patient Signature:  ________________________ Date: _______Time:_______    Nurse Signature: _________________________ Date: _______ Time:_______

## 2014-12-15 NOTE — Procedures (Signed)
Lumbar Medial Branch Radio Frequency Ablation Procedure Note    12/15/2014  [name], [id number]    PROCEDURALIST:  [place] Grayer, MD      ASSISTANT: Harmon Dun, MD      PROCEDURE DIAGNOSIS:   1. Localized Secondary Osteoarthritis Of The Lumbar Vertebrae        PROCEDURE: lumbar left  RFA at L3, L4 and L5    INDICATIONS FOR PROCEDURE:  After a discussion of the risks and benefits including but not limited to bleeding, infection, nerve damage, informed consent was obtained.  The medications are as outlined on EMR, and those were reviewed and reconciled.     PROCEDURE:    The anticipated injection site was marked and initialed by Dr. Olin Pia.  Patient placed prone on the fluoro table.  The injection site was sterilely prepped and draped.  Final verification ("time out") was performed by Dr Olin Pia and the assistants to ensure the correct patient identity, site and agreement on the procedure to be done.     The medial branch at the above named levels were identified and targeted in the fossa between the superior articular process and the transverse process for the L3 and and L4 medial branch levels and in the fossa between the transverse process and the sacral ala for the L5 medial branch.  The area over the entry site was anesthetized with a 1.5 Ml's  1% Lidocaine.  Then under direct fluoroscopic guidance,  10cm long 20-gauge spinal needles with 3mm active tip were advanced toward the targeted medial branch nerves.  The needle position was confirmed in oblique, AP and lateral fluoroscopic projections.  Aspirations was negative for both of blood and cerebrospinal fluid.     LEVEL SENSORY MOTOR MULTIFIDUS TIME/sec TEMPERATURE   L3 n/a 2.5 V Positive  80 seconds 80 celsius   L4 n/a 2.5 V Positive  80 seconds 80 celsius   L5 n/a 2.5 V Positive  80 seconds 80 celsius                             At no point was there any motor stimulation into the lower extremities.  At each level 1 cc of 2% lidocaine was injected prior to  lesioning. All needles were re-styletted and withdrawn. The patient tolerated the procedure well.  There were no complications.  The patient ambulated under his/er own power after the procedure and did not develop any new subjective or objective numbness, weakness, or paresthesias.  The patient was recovered and discharged.     The patient was advised to contact the Pain Clinic if any questions or complications arise.     I was present and supervised the entire procedure.

## 2014-12-15 NOTE — Progress Notes (Signed)
Patient was examined prior to this procedure.      Change in health status: No      Interval change in structure, distribution quality of pain: No      Interval Change:   Medication Allergies:  Yes Tylenol, percocet   Contrast Dye Allergies: No   Latex Allergies:  Yes   Current Antibiotics:  No   Current Blood Thinners: No   Recent Fevers/Chills:  No   History of Diabetes:  No   Current Steroids:  No   NPO:    Yes          Plan: Left L3,L4,L5 RFA

## 2015-01-01 ENCOUNTER — Telehealth: Payer: Self-pay | Admitting: Pain Medicine

## 2015-01-01 NOTE — Telephone Encounter (Signed)
Had a RFA on 12/25/14, it wasn't bad until this past weekend has so much pain started in low back, radiating across towards his leg both sides.  His right leg went dumb, his leg has a types of pain.   Prior to Sat (12/30/14) it was better than prior to the RFA.  Does not recall anything that can cause it.  I made him a follow up @ 01/03/15 @ 9:15am, would like to speak with doctor prior.

## 2015-01-02 ENCOUNTER — Ambulatory Visit: Payer: Self-pay | Admitting: Pain Medicine

## 2015-01-02 ENCOUNTER — Encounter: Payer: Self-pay | Admitting: Pain Medicine

## 2015-01-02 VITALS — BP 117/68 | HR 79 | Temp 97.7°F | Resp 16 | Ht 72.0 in | Wt 201.0 lb

## 2015-01-02 DIAGNOSIS — R200 Anesthesia of skin: Secondary | ICD-10-CM

## 2015-01-02 DIAGNOSIS — M5417 Radiculopathy, lumbosacral region: Secondary | ICD-10-CM

## 2015-01-02 DIAGNOSIS — M47819 Spondylosis without myelopathy or radiculopathy, site unspecified: Secondary | ICD-10-CM

## 2015-01-02 MED ORDER — LIDOCAINE 5 % EX OINT *I*
TOPICAL_OINTMENT | Freq: Three times a day (TID) | CUTANEOUS | 3 refills | Status: DC | PRN
Start: 2015-01-02 — End: 2015-05-01

## 2015-01-02 NOTE — Patient Instructions (Addendum)
Recommend applying lidocaine 5% ointment to left low back and buttock three times a day.      Recommend taking flexeril 10 mg tablet.  Take1/2 a tablet in the morning and noon and a whole tablet at night for the next couple of weeks, as tolerated.    We are recommend that you have an EMG study to evaluate your right leg numbness.      You will return to the clinic for a left piriformis muscle and lumbar trigger point injections.    [facility]     Instruction Sheet for Patients Undergoing Procedures    Arrival Time is 30 min prior to actual appointment time.  Please be prompt  .   You must have a ride home after your procedure.  Transportation must be arranged by you.  You may take a taxi but you need to have an adult with you in case you need assistance.     Nothing to eat or drink 6 (six) hours before your procedure.  You may take any necessary medications with ONLY sips of water.   Please call and ask to speak to a nurse if you have the following:  1. Cold, cough, fever, flu-like symptoms or are on antibiotics for any type of infection.  2. Taking diabetic meds such as insulin or oral medications.  3. Taking any blood thinning medications such as Coumadin, plavixetc  4. Allergic to contrast dye    Please leave all valuables at home or with the person that is with you.   Wear comfortable loose clothing.  Pants with elastic or drawstring waist are best.   For the privacy of other patients as well as yourself we are not able to allow family members or friends in the recovery area.  Also please DO NOT use your cell phone in the recovery area.     If you bring any young children or service animal, please be aware you will need to also bring someone to watch them while you are having your procedure done.     Please be aware that this appointment is specifically for a procedure.  We will not be able to refill medications or discuss treatment plans at this appointment as time is  limited.  If you need prescription refill(s) please call in prior to this appointment or have them done at your follow up appointments.        Thanks for your understanding.    If you have any questions about these instructions, please call the [facility]    Revised [id number]
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
## 2015-01-03 ENCOUNTER — Ambulatory Visit: Payer: Self-pay | Admitting: Pain Medicine

## 2015-01-12 ENCOUNTER — Encounter: Payer: Self-pay | Admitting: Pain Medicine

## 2015-01-12 ENCOUNTER — Ambulatory Visit: Payer: Self-pay | Admitting: Pain Medicine

## 2015-01-12 VITALS — BP 116/54 | HR 77 | Temp 99.0°F | Resp 16 | Ht 72.0 in | Wt 205.3 lb

## 2015-01-12 DIAGNOSIS — M545 Low back pain, unspecified: Secondary | ICD-10-CM

## 2015-01-12 DIAGNOSIS — G8929 Other chronic pain: Secondary | ICD-10-CM

## 2015-01-12 NOTE — Patient Instructions (Signed)
[facility]  Teaching Sheet    Piriformis Injection  Piriformis syndrome is a neuromuscular disorder that involves the sciatic nerve.  The piriformis muscle either compresses or irritates the sciatic nerve causing pain tingling and numbness.  Piriformis syndrome commonly causes pain that radiates down the back of the leg. The pain may be felt only on one side, but can sometimes be felt on both sides. The pain can radiate down the leg all the way to the foot and can be confused for a herniated disc in the lumbar spine. There is rarely weakness in the leg and foot.   Some people say they feel a tingling down the leg.                                                                                                                                                                                                                                                                                                                                                                        Please contact a nurse at the Pain Management Center at [phone], should you have any questions or concerns.    [facility]  Trigger Point Injection  Discharge Instructions        Your Physician has given you a trigger point injection (s).  A trigger point is an area of tenderness related to muscle spasm.  This procedure is done to relieve spasm and pain, by injecting medication directly into the area.  The local anesthetic medication used will provide a temporary numbing effect.  A steroid medication may be used to decrease inflammation and will work slowly over the next one to two weeks.    After the injection you may  1. Dizziness, particularly if several spots are injected  2. Increase tenderness in the area of your usual pain    These effects are temporary and will diminish over time.    For injection site tenderness, apply an ice pack to the injection site  for 20 minute period, then remove for one hour.  Repeat as necessary.    Please notify the Pain Center if you notice:  1. Redness, swelling or drainage from the injection sites, or have a fever  2. Prolonged dizziness    It is very important to continue your regular exercise program particularly stretching exercises. If you have received any medication to help you relax or for pain,  you should not drive, operate heavy machinery or make major decisions for the next 12 hours.    Please contact a nurse at the Pain Treatment Center at Dept: [phone] should you have any questions or concerns regarding your treatment.    I have received and understand the above instructions.    Patient signature: ________________________Date: ________ Time: _____    Nurse signature: ________________________  Date: ________ Time: _____

## 2015-01-12 NOTE — Progress Notes (Signed)
Patient was examined prior to this procedure.      Change in health status: No      Interval change in structure, distribution quality of pain: No      Interval Change:   Medication Allergies:  Yes   Contrast Dye Allergies: No   Latex Allergies:  Yes   Current Antibiotics:  No   Current Blood Thinners: No   Recent Fevers/Chills:  No   History of Diabetes:  No   Current Steroids:  No   NPO:    Yes          Plan:    Left piriformis, iliolumbar ligament and TPI(s)     Patient had been consented for the above procedure(s); however, on repeat examination - coupled with imaging guidance - pain localized primarily to left iliolumbar region - therefore only said procedure completed.

## 2015-01-13 ENCOUNTER — Other Ambulatory Visit: Payer: Self-pay | Admitting: Cardiology

## 2015-01-13 ENCOUNTER — Other Ambulatory Visit: Payer: Self-pay | Admitting: Primary Care

## 2015-01-18 NOTE — Procedures (Signed)
Left Iliolumbar Injection Procedure Note    01/12/15   [name]  [id number]      PROCEDURALIST:  [place] Grayer, MD     ASSISTANTBilley Gosling, MD      PROCEDURE DIAGNOSIS: iliolumbar, MFP     PROCEDURE:  Fluoroscopically guided, contrast controlled, left iliolumbar ligament injection     INDICATIONS FOR PROCEDURE:  After a discussion of the risks and benefits including but not limited to bleeding, infection, nerve damage, informed consent was obtained to proceed with the procedure.      The medications are as outlined on EMR, and those were reviewed and reconciled.     PROCEDURE:    The anticipated injection site was marked and initialed by Dr. Olin Pia.    The patient was brought to the flouroscopy suite and positioned prone on the fluoroscopy table, prepped, and draped in the usual sterile fashion.  The injection site was sterilely prepped and draped.  Final verification ("time out") was performed by Dr Olin Pia and the assistants to ensure the correct patient identity, site and agreement on the procedure to be done.    The area over the entry site was anesthetized with a 2.5 ml's of 1% Lidocaine.  Then under direct fluoroscopic guidance, a 3.5 inch 22-gauge spinal needle was advanced to the superior aspect of the iliac crest.  The needle position was found to be adequate on bi planer fluoroscopic views, and aspirations of blood were negative.  Omnipaque 240, 1.5 ml per joint was given, and showed appropriate spread.      We then proceeded with injection of 40mg  depomedrol, as well as 4 milliliters of 0.5% bupivacaine. The needle wass re-styletted and withdrawn.  The patient tolerated the procedure well.  There were no complications.      The patient tolerated the procedure well.  There was no new subjective or objective numbness, weakness, or paresthesias following the injection.       The patient was advised to contact the Pain Clinic if any questions or complications arise.     I was present and supervised the entire  procedure.     [place] Grayer, MD

## 2015-01-22 ENCOUNTER — Encounter: Payer: Self-pay | Admitting: Neurology

## 2015-01-24 ENCOUNTER — Ambulatory Visit: Payer: Self-pay | Admitting: Neurology

## 2015-01-24 NOTE — Procedures (Signed)
Exam location:  [facility] Attending physician: Shary Key, M.D.     Patient: [name], [name]  Patient ID: [id number]      Date of Birth: 02-28-1966 Height: 6'0" Gender: Male   Report ID: [id number] Exam Date: 01/24/2015     Referring Physician(s):  Dr. [place] Grayer / Dr. Clovis Fredrickson   Provider(s):  Shary Key, M.D. / Jerilynn Mages. Ferguson, R. NCS.T     Referring Diagnosis:  Numbness   Final Diagnosis:  Numbness / Pain, back     Patient History   [name] is a 49 year old man who was referred by Dr. [place] Grayer for electrodiagnostic evaluation of right lower extremity numbness.  The patient reports 1-2 months of numbness and burning in his right leg.  It starts in the upper thigh and can go down the leg to below the knee.  He also reports left sided low back pain that radiates into his buttocks and down the left leg.  He reports general weakness in his legs.        A time out procedure was completed to verify the patient's identity including first and last name, date of birth and confirmed with ID band. The correct procedure and site were reviewed with the patient.  Verbal consent was obtained prior to testing.  During performance of nerve conduction studies, distal limb temperature was maintained between 32 and 36 degrees centigrade.        Clinical Examination      Motor: Normal bulk and 5/5 strength throughout the lower extremities   Sensory: Reduced sharp in the right anterolateral thigh and lateral calf from the hip to the mid calf (not in a single nerve or root territory).        Reflexes:  R  L    Patellar  2  2    Achilles  2  2    Plantars  down  down                      Motor Nerve Conduction   Nerve and Stimulus Site  Latency  Amplitude  Conduction Velocity  Distance  Normal    Tibial.R/ Abductor hallucis.R    Ankle  4.9 ms  16.66 mV       90 mm  yes    Popliteal fossa  14.6 ms  14.47 mV  44.3 m/s  430 mm  yes    Peroneal.R/ Extensor digitorum brevis.R    Ankle  4.5 ms  2.82 mV       90  mm  yes    Fibula (head)  11.7 ms  2.97 mV  43.7 m/s  315 mm  yes    Popliteal fossa  14.2 ms  2.9 mV  44 m/s  110 mm  yes    Tibial.L/ Abductor hallucis.L    Ankle  4.9 ms  14.58 mV       90 mm  yes    Popliteal fossa  15 ms  8.8 mV  43 m/s  435 mm  yes    Peroneal.L/ Extensor digitorum brevis.L    Ankle  5.4 ms  3.7 mV       90 mm  yes    Fibula (head)  12.6 ms  3.27 mV  41.6 m/s  300 mm  yes    Popliteal fossa  14.9 ms  3.33 mV  54.3 m/s  125 mm  yes        Sensory  Nerve Conduction   Nerve and Stimulus Site  Segment  Latency  Amplitude  Conduction Velocity  Distance  Normal    Sural.R    Lower leg  Ankle-Lower leg  2.5 ms  26.6 uV  48 m/s  120 mm  yes    Medial plantar.R    Medial sole  Ankle-Medial sole  2.5 ms  7.7 uV  52 m/s  130 mm  yes    Sural.L    Lower leg  Ankle-Lower leg  2.7 ms  25.5 uV  44.4 m/s  120 mm  yes    Medial plantar.L    Medial sole  Ankle-Medial sole  2.3 ms  7.2 uV  52.1 m/s  120 mm  yes        Needle EMG Data   Muscle S i d e Spontaneous Activity Motor Unit Morfology Interference Pattern     Insertional Activity Fibs/Pos. Waves Fascics Duration Amplitude Phases Activation Recruit ment   Lumbar paraspinal (low)  R  Normal  0  0                                  L4 paraspinal level    Iliopsoas  R  Normal  0  0  Normal  Normal  Normal  Normal  Normal    Adductor Longus  R  Normal  0  0  Normal  Normal  Normal  Normal  Normal    Vastus Medius  R  Normal  0  0  Normal  Normal  Normal  Normal  Normal    Gluteus Maximus  R  Normal  0  0  Normal  Normal  Normal  -1  Normal    Tensor Fasciae Latae  R  Normal  0  0  Normal  Normal  Normal  Normal  Normal    Biceps Femoris (long)  R  Normal  0  0  Normal  Normal  Normal  -1  Normal    Tibialis Anterior  R  Normal  0  0  Normal  Normal  Normal  -1  Normal    tremor    Medial Gastrocnemius   R  Normal  0  0  Normal  Normal  Normal  -1  Normal    tremor           Interpretation & Conclusions   This is a normal study.        Peroneal and tibial motor  nerve conduction studies are normal bilaterally.  Sural and medial plantar sensory nerve conduction studies are normal bilaterally.  Needle EMG examination of right L3-S1 innervated leg and paraspinal muscles  is normal.      Overall, there is no electrodiagnostic evidence of a right sided lumbosacral radiculopathy, sciatic or peroneal mononeuropathy, or of a large fiber peripheral neuropathy to account for the patient's multi-dermatomal sensory symptoms and signs.             Signature   This report signed electronically by    Shary Key, M.D. on 01/24/2015 12:48 PM       Shary Key, M.D. / Jerilynn Mages. Ferguson, R. NCS.T

## 2015-02-11 ENCOUNTER — Telehealth: Payer: Self-pay | Admitting: Primary Care
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
## 2015-02-16 ENCOUNTER — Other Ambulatory Visit: Payer: Self-pay | Admitting: Primary Care

## 2015-02-16 DIAGNOSIS — M545 Low back pain, unspecified: Secondary | ICD-10-CM

## 2015-02-16 DIAGNOSIS — G8929 Other chronic pain: Secondary | ICD-10-CM

## 2015-02-16 MED ORDER — CELECOXIB 200 MG PO CAPS *I*
200.0000 mg | ORAL_CAPSULE | Freq: Two times a day (BID) | ORAL | 6 refills | Status: DC
Start: 2015-02-16 — End: 2015-03-02

## 2015-03-02 ENCOUNTER — Other Ambulatory Visit: Payer: Self-pay | Admitting: Primary Care

## 2015-03-02 DIAGNOSIS — G8929 Other chronic pain: Secondary | ICD-10-CM

## 2015-03-02 DIAGNOSIS — M545 Low back pain, unspecified: Secondary | ICD-10-CM

## 2015-03-02 MED ORDER — CELECOXIB 200 MG PO CAPS *I*
200.0000 mg | ORAL_CAPSULE | Freq: Two times a day (BID) | ORAL | 5 refills | Status: DC
Start: 2015-03-02 — End: 2015-03-16

## 2015-03-06 ENCOUNTER — Ambulatory Visit: Payer: Self-pay | Admitting: Pain Medicine

## 2015-03-06 ENCOUNTER — Encounter: Payer: Self-pay | Admitting: Pain Medicine

## 2015-03-06 VITALS — BP 112/66 | HR 66 | Temp 98.4°F | Resp 16 | Ht 72.0 in | Wt 207.6 lb

## 2015-03-06 DIAGNOSIS — G8929 Other chronic pain: Secondary | ICD-10-CM

## 2015-03-06 DIAGNOSIS — M545 Low back pain, unspecified: Secondary | ICD-10-CM
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
## 2015-03-16 ENCOUNTER — Encounter: Payer: Self-pay | Admitting: Pain Medicine

## 2015-03-16 ENCOUNTER — Other Ambulatory Visit: Payer: Self-pay | Admitting: Primary Care

## 2015-03-16 ENCOUNTER — Ambulatory Visit: Payer: Self-pay | Admitting: Pain Medicine

## 2015-03-16 VITALS — BP 118/79 | HR 77 | Temp 98.4°F | Resp 18 | Ht 72.0 in | Wt 209.0 lb

## 2015-03-16 DIAGNOSIS — M545 Low back pain, unspecified: Secondary | ICD-10-CM

## 2015-03-16 DIAGNOSIS — G8929 Other chronic pain: Secondary | ICD-10-CM

## 2015-03-16 MED ORDER — CELECOXIB 200 MG PO CAPS *I*
200.0000 mg | ORAL_CAPSULE | Freq: Two times a day (BID) | ORAL | 5 refills | Status: DC
Start: 2015-03-16 — End: 2015-04-16

## 2015-03-16 NOTE — Patient Instructions (Signed)
General discharge instructions for [facility]    Notify the [facility] MD at [phone] if you experience the following symptoms: redness, swelling, drainage from the injection site(s), fever > 100 degrees, weakness more than 3 days, headache, or any other problems related to the procedure.    Diet:  Resume previous diet    Bathing/showering:  No restrictions     Activity:  No restriction.    Care of injection site:  Remove dressing in 1 days.  You may use ice on the injection site, on for 20 minutes and off for 1 hour.  Repeat as needed.    Pain Management/other:Resume current medications.    Signature________________________________________date_________ time_______    Signature________________________________________date_________ time_______

## 2015-03-16 NOTE — Progress Notes (Signed)
Patient was examined prior to this procedure.      Change in health status: No      Interval change in structure, distribution quality of pain: No      Interval Change:   Medication Allergies:  Yes   Contrast Dye Allergies: No   Latex Allergies:  Yes   Current Antibiotics:  No   Current Blood Thinners: Yes - asprin 81 mg qAM   Recent Fevers/Chills:  No   History of Diabetes:  No   Current Steroids:  No   NPO:    No           Plan: Left iliolumbar ligament TPIs

## 2015-03-20 NOTE — Procedures (Signed)
Left Iliolumbar Injection Procedure Note    03/16/15   EPHREM CARRICK  [id number]    PROCEDURALIST: [place] Grayer, MD    ASSISTANT: Delanna Ahmadi, MD, Poku, MD     PROCEDURE DIAGNOSIS: iliolumbar, MFP     PROCEDURE:  Fluoroscopically guided, contrast controlled, left iliolumbar   ligament injection     INDICATIONS FOR PROCEDURE:  After a discussion of the risks and benefits including but not   limited to bleeding, infection, nerve damage, informed consent   was obtained to proceed with the procedure.     The medications are as outlined on EMR, and those were reviewed   and reconciled.    PROCEDURE:    The anticipated injection site was marked and initialed by Dr.   Olin Pia.    The patient was brought to the flouroscopy suite and positioned   prone on the fluoroscopy table, prepped, and draped in the usual   sterile fashion. The injection site was sterilely prepped and   draped. Final verification ("time out") was performed by Dr Olin Pia   and the assistants to ensure the correct patient identity, site   and agreement on the procedure to be done.    The area over the entry site was anesthetized with a 2.5 ml's of   1% Lidocaine. Then under direct fluoroscopic guidance, a 3.5   inch 22-gauge spinal needle was advanced to the superior aspect   of the iliac crest. The needle position was found to be adequate   on bi planer fluoroscopic views, and aspirations of blood were   negative. Omnipaque 240, 1.5 ml per joint was given, and showed   appropriate spread.     We then proceeded with injection of 40mg  depomedrol, as well as 4   milliliters of 0.5% bupivacaine. The needle wass re-styletted and   withdrawn. The patient tolerated the procedure well. There were   no complications.     The patient tolerated the procedure well. There was no new   subjective or objective numbness, weakness, or paresthesias   following the injection.      The patient was advised to contact the Pain Clinic if any   questions or complications  arise.     I was present and supervised the entire procedure.     [place] Grayer, MD

## 2015-04-09 ENCOUNTER — Telehealth: Payer: Self-pay | Admitting: Primary Care

## 2015-04-09 ENCOUNTER — Other Ambulatory Visit: Payer: Self-pay | Admitting: Primary Care

## 2015-04-09 MED ORDER — NON-SYSTEM MEDICATION *A*
0 refills | Status: DC
Start: 2015-04-09 — End: 2015-05-02

## 2015-04-09 NOTE — Telephone Encounter (Signed)
Patient would like a script for a new full length arm brace with hinge sent to [facility], fax# [phone]

## 2015-04-09 NOTE — Telephone Encounter (Signed)
Rx generated and sent to provider for authorization.  Will fax to [facility] at # provided when script prints.  MMorris LPN

## 2015-04-09 NOTE — Telephone Encounter (Signed)
Please generate a prescription order to this effect.

## 2015-04-11 ENCOUNTER — Ambulatory Visit: Payer: Self-pay

## 2015-04-11 ENCOUNTER — Other Ambulatory Visit: Payer: Self-pay | Admitting: Primary Care

## 2015-04-11 MED ORDER — NON-SYSTEM MEDICATION *A*
0 refills | Status: DC
Start: 2015-04-11 — End: 2015-05-02

## 2015-04-12 ENCOUNTER — Telehealth: Payer: Self-pay | Admitting: Sleep Medicine

## 2015-04-12 DIAGNOSIS — G4733 Obstructive sleep apnea (adult) (pediatric): Secondary | ICD-10-CM

## 2015-04-12 MED ORDER — AUTO-TITRATING CPAP MACHINE *A*
0 refills | Status: DC
Start: 2015-04-12 — End: 2015-05-02

## 2015-04-12 NOTE — Telephone Encounter (Signed)
Pt needs script for CPAP machine and supplies faxed to [facility] at [phone] attn: [place], He is switching from [place] to [place], Thank you

## 2015-04-12 NOTE — Telephone Encounter (Signed)
Please see below note with DME change.  Thanks - MU

## 2015-04-12 NOTE — Telephone Encounter (Signed)
Rx provided.    Carmin Muskrat, PA

## 2015-04-12 NOTE — Telephone Encounter (Signed)
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 He needs the paperwork from his sleep study and prescriptions for both companies. He would like call back at [phone]. He would like the information faxed to him at [phone]. Att: [place].

## 2015-04-16 ENCOUNTER — Other Ambulatory Visit: Payer: Self-pay | Admitting: Primary Care

## 2015-04-16 DIAGNOSIS — G8929 Other chronic pain: Secondary | ICD-10-CM

## 2015-04-16 DIAGNOSIS — M545 Low back pain: Secondary | ICD-10-CM

## 2015-04-16 MED ORDER — CELECOXIB 200 MG PO CAPS *I*
200.0000 mg | ORAL_CAPSULE | Freq: Two times a day (BID) | ORAL | 5 refills | Status: DC
Start: 2015-04-16 — End: 2015-06-04

## 2015-04-17 ENCOUNTER — Telehealth: Payer: Self-pay

## 2015-04-17 NOTE — Telephone Encounter (Signed)
[employer] / [employer] - [place] (new DME) / Sct[employer] & HST paperwork faxed and mailed. Script for a S10 AutoPAP unit set @ 5-18cm with humidifier, heated tubing, and all related supplies. [employer] will have to obtain pt's insurance info. [employer] listed in our system has expired.

## 2015-04-19 ENCOUNTER — Other Ambulatory Visit: Payer: Self-pay | Admitting: Primary Care

## 2015-04-20 ENCOUNTER — Other Ambulatory Visit: Payer: Self-pay | Admitting: Primary Care

## 2015-04-20 DIAGNOSIS — M545 Low back pain, unspecified: Secondary | ICD-10-CM

## 2015-04-20 DIAGNOSIS — G8929 Other chronic pain: Secondary | ICD-10-CM

## 2015-04-20 MED ORDER — GABAPENTIN 300 MG PO CAPSULE *I*
300.0000 mg | ORAL_CAPSULE | Freq: Four times a day (QID) | ORAL | 5 refills | Status: DC
Start: 2015-04-20 — End: 2015-06-04

## 2015-04-26 ENCOUNTER — Emergency Department
Admission: EM | Admit: 2015-04-26 | Disposition: A | Discharge status: Home / Self Care | Payer: Self-pay | Source: Ambulatory Visit

## 2015-04-26 ENCOUNTER — Other Ambulatory Visit: Payer: Self-pay | Admitting: Gastroenterology

## 2015-04-26 ENCOUNTER — Encounter: Payer: Self-pay | Admitting: Emergency Medicine

## 2015-04-26 ENCOUNTER — Other Ambulatory Visit: Payer: Self-pay | Admitting: Neurology

## 2015-04-26 DIAGNOSIS — R404 Transient alteration of awareness: Secondary | ICD-10-CM

## 2015-04-26 LAB — BASIC METABOLIC PANEL
Anion Gap: 15 (ref 7–16)
CO2: 24 mmol/L (ref 20–28)
Calcium: 9.6 mg/dL (ref 8.6–10.2)
Chloride: 103 mmol/L (ref 96–108)
Creatinine: 0.85 mg/dL (ref 0.67–1.17)
GFR,Black: 117 *
GFR,Caucasian: 101 *
Glucose: 91 mg/dL (ref 60–99)
Lab: 18 mg/dL (ref 6–20)
Potassium: 4.5 mmol/L (ref 3.3–5.1)
Sodium: 142 mmol/L (ref 133–145)

## 2015-04-26 LAB — CBC AND DIFFERENTIAL
Baso # K/uL: 0 10*3/uL (ref 0.0–0.1)
Basophil %: 0.3 %
Eos # K/uL: 0.1 10*3/uL (ref 0.0–0.5)
Eosinophil %: 0.8 %
Hematocrit: 45 % (ref 40–51)
Hemoglobin: 15.2 g/dL (ref 13.7–17.5)
IMM Granulocytes #: 0 10*3/uL (ref 0.0–0.1)
IMM Granulocytes: 0.3 %
Lymph # K/uL: 1.8 10*3/uL (ref 1.3–3.6)
Lymphocyte %: 27.3 %
MCH: 32 pg/cell (ref 26–32)
MCHC: 34 g/dL (ref 32–37)
MCV: 94 fL — ABNORMAL HIGH (ref 79–92)
Mono # K/uL: 0.5 10*3/uL (ref 0.3–0.8)
Monocyte %: 7.5 %
Neut # K/uL: 4.2 10*3/uL (ref 1.8–5.4)
Nucl RBC # K/uL: 0 10*3/uL (ref 0.0–0.0)
Nucl RBC %: 0 /100 WBC (ref 0.0–0.2)
Platelets: 297 10*3/uL (ref 150–330)
RBC: 4.8 MIL/uL (ref 4.6–6.1)
RDW: 13.1 % (ref 11.6–14.4)
Seg Neut %: 63.8 %
WBC: 6.6 10*3/uL (ref 4.2–9.1)

## 2015-04-26 LAB — HOLD GREEN WITH GEL

## 2015-04-26 LAB — HOLD BLUE

## 2015-04-26 LAB — TROPONIN T
Troponin T: <0.01 ng/mL (ref 0.00–0.02)
Troponin T: <0.01 ng/mL (ref 0.00–0.02)

## 2015-04-26 LAB — CK: CK: 143 U/L (ref 46–171)

## 2015-04-26 NOTE — ED Notes (Signed)
Resting in bed. Would prefer to go home and followup as outpatient. Provider at bedside.

## 2015-04-26 NOTE — ED Provider Progress Notes (Signed)
ED Provider Progress Note    Pt reports he feels well    Patient Vitals for the past 72 hrs:   BP Temp Temp src Pulse Resp SpO2 Height Weight   04/26/15 1821 (!) 116/95 36.6 C (97.9 F) TEMPORAL 68 16 96 % - -   04/26/15 1454 119/81 36.7 C (98.1 F) TEMPORAL 64 11 96 % - -   04/26/15 1126 128/81 36.6 C (97.9 F) - 71 18 95 % 1.829 m (6') 95.3 kg (210 lb)       Trop < 0.01 x2 (6 hours apart)      Lab results: 04/26/15  1231   WBC 6.6   HEMOGLOBIN 15.2   HEMATOCRIT 45   RBC 4.8   PLATELETS 297   NEUT # K/UL 4.2   LYMPH # K/UL 1.8   MONO # K/UL 0.5   EOS # K/UL 0.1   BASO # K/UL 0.0   SEG NEUT % 63.8   LYMPHOCYTE % 27.3   MONOCYTE % 7.5   EOSINOPHIL % 0.8   BASOPHIL % 0.3             Lab results: 04/26/15  1231 11/01/14  0731 05/16/14  0842   SODIUM 142 142 141   POTASSIUM 4.5 5.2* 5.0   CHLORIDE 103 103 102   CO2 24 26 26    UN 18 19 19    CREATININE 0.85 0.96 1.00   GFR,CAUCASIAN 101 92 88   GFR,BLACK 117 106 101   GLUCOSE 91 113* 106*   CALCIUM 9.6 9.4 9.4     CK= 143    EKG shows NSR no significant change from prior no acute ischemic changes    Ct Head Without Contrast    Result Date: 04/26/2015  Exam Site:  [facility]aging at [facility] 04/26/2015 1:54 PM  CT BRAIN WITHOUT CONTRAST  ORDERING CLINICAL INFORMATION:  ERECORD: sudden onset headache and syncope ADDITIONAL CLINICAL INFORMATION:   None.  TECHNIQUE:  CT of the brain was performed without intravenous contrast.  Axial, coronal and sagittal reformations were acquired.  COMPARISON:  MR of the head from 09/25/2010.  FINDINGS:  CEREBRAL PARENCHYMA:  No abnormal attenuation is visualized within the brain parenchyma. No appreciable mass effect. No intracranial hemorrhage.  VENTRICLES AND EXTRA-AXIAL SPACES:  The ventricles, sulci and fissures are normal in size and configuration for the patient's age.  No extra-axial fluid collection is present.  CALVARIUM AND VISUALIZED SKULL BASE:  Intact. EXTRACRANIAL STRUCTURES:  The visualized paranasal  sinuses are clear. The visualized mastoid air cells are clear.      IMPRESSION:   No acute intracranial abnormality.   I have personally reviewed the image(s) and the resident's interpretation and agree with or edited the findings.      Attempts made to contact Dr. Sydell Axon (cardiologist- per patient request)- without result.     Offered pt to stay in ED obs for syncope workup- pt refused    Appreciate Neuro input.   Pt advised against driving  Will need o/p f/u with Neuro, EEG    Advised pt to return to the ED for any new/ worsening symptoms.           Vira Browns, PA, 04/26/2015, 6:54 PM    Supervising physician Dr. Boykin Reaper was immediately available     Vira Browns, [place]  04/26/15 [phone]

## 2015-04-26 NOTE — Discharge Instructions (Signed)
You were evaluated for syncope (passing out).   There is a concern that your syncope may be a result of a seizure- you were evaluated by neruology who did not feel that this was very likely but still would like you to have an EEG as an outpatient and follow up with Dr. Lehman Prom  YOU CAN NOT DRIVE UNTIL YOU ARE CLEARED BY NEUROLOGY.   Your troponins were negative x2.   You were offered observation admission for serial troponin, ekg, telemetry, EEG in the morning but opted not to stay.   You should follow up with your PCP, Dr. Lehman Prom, Dr. Sydell Axon  Return to the ED for syncope, near syncope, chest pain, shortness of breath or any new/ worsening symptoms.

## 2015-04-26 NOTE — ED Notes (Signed)
Pt verbalized understanding of discharge instructions. Wife will drive him home. Pt verbalized understanding that he cannot drive until he's cleared by neurology

## 2015-04-26 NOTE — ED Notes (Signed)
Per provider pt can eat 

## 2015-04-26 NOTE — ED Notes (Addendum)
Pt reports that he got home around 10 to 9. Sat down on couch, watching TV for 15 minutes then all the sudden realized was still watching TV and realized he had urinating. Not sure if he passed out or not. Got up, head was achy, felt weak and tired. Changed clothesand head started to ache more. Limbs were shaking. Called EMS. On phone with ambulance started feeling really tired and seeing floaters. Reports that he's feeling better but feels a little weak.   Pt assisted into gown and placed on telemetry
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
## 2015-04-26 NOTE — ED Triage Notes (Signed)
Pt had syncopal episode today on the couch. Developed sudden onset of HA at [name]. No n/t, felt weak everywhere. Denies blurry vision double vision, endorses floaters.        Triage Note   Suanne Marker, RN

## 2015-04-26 NOTE — ED Notes (Signed)
Pt to CT

## 2015-04-26 NOTE — ED Notes (Signed)
Pt resting comfortably in bed. Family at bedside. Wants to go home
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
## 2015-04-30 LAB — EKG 12-LEAD
P: 43 degrees
QRS: 6 degrees
Rate: 68 {beats}/min
Severity: NORMAL
Severity: NORMAL
T: 42 degrees

## 2015-05-01 ENCOUNTER — Encounter: Payer: Self-pay | Admitting: Neurology

## 2015-05-01 ENCOUNTER — Ambulatory Visit: Payer: Self-pay | Admitting: Neurology

## 2015-05-01 ENCOUNTER — Ambulatory Visit: Payer: Self-pay

## 2015-05-01 VITALS — BP 116/72 | HR 72 | Ht 71.5 in | Wt 208.0 lb

## 2015-05-01 DIAGNOSIS — R404 Transient alteration of awareness: Secondary | ICD-10-CM
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
## 2015-05-01 NOTE — Procedures (Signed)
PROCEDURE: Outpatient EEG    HISTORY:  [name] is a 49 y.o. male with a history of HTN, hyperlipidemia, CAD, degenerative disc disease, chronic pain, anxiety, BPH, palpitations, and episode of loss of awareness and incontinence. This EEG was ordered to evaluate for epileptiform abnormalities.    MEDICATIONS:    Current Outpatient Prescriptions   Medication    DULoxetine (CYMBALTA) 30 MG DR capsule    gabapentin (GABAPENTIN) 300 MG capsule    celecoxib (CELEBREX) 200 MG capsule    auto-titrating CPAP (AUTOSET) machine    Non-System Medication    Non-System Medication    lisinopril (PRINIVIL,ZESTRIL) 5 MG tablet    atorvastatin (LIPITOR) 40 MG tablet    omeprazole (PRILOSEC) 20 MG capsule    metoprolol (LOPRESSOR) 25 MG tablet    cyclobenzaprine (FLEXERIL) 10 MG tablet    nitroglycerin (NITROSTAT) 0.4 MG SL tablet    amLODIPine (NORVASC) 5 MG tablet    diclofenac (VOLTAREN) 1 % gel    fluticasone (FLONASE) 50 MCG/ACT nasal spray    CPAP machine    co-enzyme Q-10 (CO Q10) 100 MG capsule    aspirin 81 MG EC tablet     No current facility-administered medications for this visit.          DESCRIPTION:  The recording was performed on an XLTEK Digital EEG machine utilizing at least 21 electrodes measured and applied according to the International 10-20 system.  The duration of the study was *41 minutes.    Clinical state: waking and sleep  Response to hyperventilation: unremarkable   Response to photic stimulation: unremarkable     The waking background showed appropriate organization with clearly defined anterior-posterior voltage and frequency gradients.  There was a well-defined posterior dominant rhythm of 9 Hertz, which was symmetrical and showed normal reactivity.  Anteriorly, there was an expected pattern of lower voltage, irregular, mixed faster frequencies.        There is waxing and waning of the background activity with drowsiness and transition to stage I and II sleep where symmetric  sleep elements are seen.     Throughout the recording, there were no epileptiform discharges, paroxysmal features, focal features or significant interhemispheric asymmetries.    IMPRESSION:  This is a normal waking and sleep EEG.  There are no focal or epileptiform abnormalities.    Signed by: Tyrone Schimke, DO on 05/01/2015 at 3:03 PM

## 2015-05-02 ENCOUNTER — Encounter: Payer: Self-pay | Admitting: Primary Care

## 2015-05-02 ENCOUNTER — Ambulatory Visit
Admission: RE | Admit: 2015-05-02 | Discharge: 2015-05-02 | Disposition: A | Discharge status: Home / Self Care | Payer: Self-pay | Source: Ambulatory Visit | Attending: Cardiology | Admitting: Cardiology

## 2015-05-02 ENCOUNTER — Encounter: Payer: Self-pay | Admitting: Cardiology

## 2015-05-02 ENCOUNTER — Ambulatory Visit: Payer: Self-pay | Admitting: Primary Care

## 2015-05-02 ENCOUNTER — Ambulatory Visit: Payer: Self-pay | Admitting: Cardiology

## 2015-05-02 VITALS — BP 104/74 | HR 80 | Ht 71.5 in | Wt 208.0 lb

## 2015-05-02 VITALS — BP 122/84 | Ht 71.5 in | Wt 214.0 lb

## 2015-05-02 DIAGNOSIS — E7800 Pure hypercholesterolemia, unspecified: Secondary | ICD-10-CM

## 2015-05-02 DIAGNOSIS — R55 Syncope and collapse: Secondary | ICD-10-CM

## 2015-05-02 DIAGNOSIS — I2510 Atherosclerotic heart disease of native coronary artery without angina pectoris: Secondary | ICD-10-CM

## 2015-05-02 DIAGNOSIS — I25118 Atherosclerotic heart disease of native coronary artery with other forms of angina pectoris: Secondary | ICD-10-CM

## 2015-05-02 DIAGNOSIS — R404 Transient alteration of awareness: Secondary | ICD-10-CM

## 2015-05-02 DIAGNOSIS — I10 Essential (primary) hypertension: Secondary | ICD-10-CM

## 2015-05-02 DIAGNOSIS — Z125 Encounter for screening for malignant neoplasm of prostate: Secondary | ICD-10-CM

## 2015-05-02 LAB — LIPID PANEL
Chol/HDL Ratio: 3.7
Cholesterol: 116 mg/dL
HDL: 31 mg/dL
LDL Calculated: 48 mg/dL
Non HDL Cholesterol: 85 mg/dL
Triglycerides: 184 mg/dL — AB

## 2015-05-02 LAB — COMPREHENSIVE METABOLIC PANEL
ALT: 43 U/L (ref 0–50)
AST: 26 U/L (ref 0–50)
Albumin: 5 g/dL (ref 3.5–5.2)
Alk Phos: 92 U/L (ref 40–130)
Anion Gap: 12 (ref 7–16)
Bilirubin,Total: 0.3 mg/dL (ref 0.0–1.2)
CO2: 28 mmol/L (ref 20–28)
Calcium: 9.4 mg/dL (ref 8.6–10.2)
Chloride: 102 mmol/L (ref 96–108)
Creatinine: 0.89 mg/dL (ref 0.67–1.17)
GFR,Black: 115 *
GFR,Caucasian: 99 *
Glucose: 108 mg/dL — ABNORMAL HIGH (ref 60–99)
Lab: 15 mg/dL (ref 6–20)
Potassium: 4.9 mmol/L (ref 3.3–5.1)
Sodium: 142 mmol/L (ref 133–145)
Total Protein: 7.5 g/dL (ref 6.3–7.7)

## 2015-05-02 LAB — PSA (EFF.4-2010): PSA (eff. 4-2010): 0.82 ng/mL (ref 0.00–4.00)
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
## 2015-05-02 NOTE — Progress Notes (Signed)
S:  [name] returns to the office for follow up of his hypertension, hypercholesterolemia and CAD as well as a recent trip to the hospital ED.    He continues with his usual medications including metoprolol, lisinopril, amlodipine, atorvastatin, omeprazole, gabapentin, cyclobenzaprine, Cymbalta, Voltaren gel, co-Q10, baby aspirin, Celebrex, Nitrostat and fluticasone nasal spray.  He denies any problems with headaches, dizziness, vision changes, shortness of breath, heartburn, nausea, diarrhea, constipation, bleeding problems or fatigue.  He continues to experience occasional episodes of chest pain.  His symptoms are typically relieved with the use of the Nitrostat although it does trigger him to experience painful headaches.  He is doing his best to watch his eating habits and stay active although he does not have a regular exercise routine.  He is not in the habit of checking his blood pressures at home.    He reports that he was watching the news on television at home on the morning of 04/26/15 when he suddenly realized that he had wet his pants.  As he arose from the couch he realized that he felt tired and weak.  He felt confused.  In retrospect he believes that he had had a loss of consciousness.  He cleaned himself up and then called his wife.  She recommended that he call the doctor's office.  They realized that the office was closed so he decided to call an ambulance.  He was taken to the [facility] ED.  He underwent a cardiac work up which proved negative.  Neurology was consulted.  There was suspicion that he had suffered a seizure.  He was scheduled for follow up with neurologist, Dr. Noah Charon whom he met with yesterday.  He suspected a seizure as well.  An EEG was performed which proved normal.  He was told to abstain from driving until otherwise advised.  He is to follow up with Dr. Noah Charon in 6 months.    He did not complete his blood work prior to today's appointment.    O:  General: Alert,  appropriate, pleasant man in no apparent distress.  Neck: Supple, no lymphadenopathy, no thyromegaly, 2+ carotid pulses bilaterally.  Heart: Regular rate and rhythm, no murmur.  Lungs: Clear to auscultation bilaterally.  Abdomen: Positive bowel sounds, soft, nontender, nondistended, no masses, no organomegaly.  Extremities: 2+ radial and 2+ posterior tibial pulses bilaterally.  No clubbing, cyanosis or edema.  Neuro: CN II-XII intact.  No sensory or motor deficits.  Brisk, uniform upper and lower extremity reflexes bilaterally.  5/5 muscle strength in the flexors and extensors of the shoulders, elbows, hips and knees bilaterally.    A:  1.  Essential hypertension -- well controlled  2.  Hypercholesterolemia -- due for follow up  3.  CAD -- intermittent angina  4.  Transient alteration of awareness -- consistent with seizure    P:  1.  Reduce the dose of Celebrex to 200 mg once daily.  2.  Continue with other medications as previously prescribed.  3.  Continue to maintain efforts on prudent diet, exercise and weight management.  4.  Abstain from driving as instructed by Dr. Noah Charon.  5.  Complete the blood work that was previously requested in the near future.  6.  RTO in 6 months for follow up of the above problems or sooner if any other problems or concerns.  7.  Complete blood work prior to the next appointment to check an FLP, CMP and PSA.

## 2015-05-11 ENCOUNTER — Encounter: Payer: Self-pay | Admitting: Cardiology

## 2015-05-14 ENCOUNTER — Other Ambulatory Visit: Payer: Self-pay | Admitting: Primary Care

## 2015-05-22 ENCOUNTER — Ambulatory Visit: Payer: Self-pay | Admitting: Pain Medicine

## 2015-05-22 ENCOUNTER — Encounter: Payer: Self-pay | Admitting: Pain Medicine

## 2015-05-22 VITALS — BP 125/72 | HR 72 | Temp 97.5°F | Resp 16 | Ht 71.5 in | Wt 212.1 lb

## 2015-05-22 DIAGNOSIS — M545 Low back pain, unspecified: Secondary | ICD-10-CM

## 2015-05-22 DIAGNOSIS — G8929 Other chronic pain: Secondary | ICD-10-CM
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
## 2015-06-04 ENCOUNTER — Other Ambulatory Visit: Payer: Self-pay | Admitting: Primary Care

## 2015-06-04 ENCOUNTER — Other Ambulatory Visit: Payer: Self-pay | Admitting: Cardiology

## 2015-06-04 DIAGNOSIS — M545 Low back pain, unspecified: Secondary | ICD-10-CM

## 2015-06-04 DIAGNOSIS — G8929 Other chronic pain: Secondary | ICD-10-CM

## 2015-06-04 MED ORDER — OMEPRAZOLE 20 MG PO CPDR *I*
20.0000 mg | DELAYED_RELEASE_CAPSULE | Freq: Two times a day (BID) | ORAL | 5 refills | Status: DC
Start: 2015-06-04 — End: 2015-08-24

## 2015-06-04 MED ORDER — DULOXETINE HCL 30 MG PO CPEP *I*
30.0000 mg | DELAYED_RELEASE_CAPSULE | Freq: Two times a day (BID) | ORAL | 5 refills | Status: DC
Start: 2015-06-04 — End: 2015-08-02

## 2015-06-04 MED ORDER — ATORVASTATIN CALCIUM 40 MG PO TABS *I*
40.0000 mg | ORAL_TABLET | Freq: Every day | ORAL | 3 refills | Status: DC
Start: 2015-06-04 — End: 2015-08-24

## 2015-06-04 MED ORDER — AMLODIPINE BESYLATE 5 MG PO TABS *I*
5.0000 mg | ORAL_TABLET | Freq: Every day | ORAL | 3 refills | Status: DC
Start: 2015-06-04 — End: 2015-08-16

## 2015-06-04 MED ORDER — LISINOPRIL 5 MG PO TABS *I*
5.0000 mg | ORAL_TABLET | Freq: Every day | ORAL | 3 refills | Status: DC
Start: 2015-06-04 — End: 2015-08-24

## 2015-06-04 MED ORDER — GABAPENTIN 300 MG PO CAPSULE *I*
ORAL_CAPSULE | ORAL | 5 refills | Status: DC
Start: 2015-06-04 — End: 2015-08-24

## 2015-06-04 MED ORDER — CELECOXIB 200 MG PO CAPS *I*
200.0000 mg | ORAL_CAPSULE | Freq: Two times a day (BID) | ORAL | 5 refills | Status: DC
Start: 2015-06-04 — End: 2015-08-24

## 2015-06-04 MED ORDER — METOPROLOL TARTRATE 25 MG PO TABS *I*
12.5000 mg | ORAL_TABLET | Freq: Two times a day (BID) | ORAL | 6 refills | Status: DC
Start: 2015-06-04 — End: 2015-08-13

## 2015-08-01 ENCOUNTER — Ambulatory Visit: Payer: Self-pay | Admitting: Pain Medicine

## 2015-08-02 ENCOUNTER — Other Ambulatory Visit: Payer: Self-pay | Admitting: Primary Care

## 2015-08-02 ENCOUNTER — Other Ambulatory Visit: Payer: Self-pay | Admitting: Cardiology

## 2015-08-02 DIAGNOSIS — M79602 Pain in left arm: Secondary | ICD-10-CM

## 2015-08-02 MED ORDER — CYCLOBENZAPRINE HCL 10 MG PO TABS *I*
10.0000 mg | ORAL_TABLET | Freq: Three times a day (TID) | ORAL | 5 refills | Status: DC
Start: 2015-08-02 — End: 2015-08-24

## 2015-08-02 MED ORDER — ASPIRIN 81 MG PO TBEC *I*
81.0000 mg | DELAYED_RELEASE_TABLET | Freq: Every day | ORAL | 3 refills | Status: AC
Start: 2015-08-02 — End: ?

## 2015-08-02 MED ORDER — DULOXETINE HCL 30 MG PO CPEP *I*
30.0000 mg | DELAYED_RELEASE_CAPSULE | Freq: Two times a day (BID) | ORAL | 5 refills | Status: DC
Start: 2015-08-02 — End: 2015-08-24

## 2015-08-03 ENCOUNTER — Encounter: Payer: Self-pay | Admitting: Pain Medicine

## 2015-08-11 ENCOUNTER — Other Ambulatory Visit: Payer: Self-pay | Admitting: Family Medicine

## 2015-08-12 ENCOUNTER — Other Ambulatory Visit: Payer: Self-pay | Admitting: Family Medicine

## 2015-08-13 ENCOUNTER — Other Ambulatory Visit: Payer: Self-pay | Admitting: Primary Care

## 2015-08-13 MED ORDER — METOPROLOL TARTRATE 25 MG PO TABS *I*
12.5000 mg | ORAL_TABLET | Freq: Two times a day (BID) | ORAL | 6 refills | Status: DC
Start: 2015-08-13 — End: 2015-08-24

## 2015-08-16 ENCOUNTER — Other Ambulatory Visit: Payer: Self-pay | Admitting: Cardiology

## 2015-08-16 MED ORDER — AMLODIPINE BESYLATE 5 MG PO TABS *I*
5.0000 mg | ORAL_TABLET | Freq: Every day | ORAL | 11 refills | Status: DC
Start: 2015-08-16 — End: 2015-08-24

## 2015-08-24 ENCOUNTER — Other Ambulatory Visit: Payer: Self-pay

## 2015-08-24 DIAGNOSIS — M545 Low back pain, unspecified: Secondary | ICD-10-CM

## 2015-08-24 DIAGNOSIS — M79602 Pain in left arm: Secondary | ICD-10-CM

## 2015-08-24 DIAGNOSIS — G8929 Other chronic pain: Secondary | ICD-10-CM

## 2015-08-24 MED ORDER — CYCLOBENZAPRINE HCL 10 MG PO TABS *I*
10.0000 mg | ORAL_TABLET | Freq: Three times a day (TID) | ORAL | 0 refills | Status: DC
Start: 2015-08-24 — End: 2016-06-02

## 2015-08-24 MED ORDER — LISINOPRIL 5 MG PO TABS *I*
5.0000 mg | ORAL_TABLET | Freq: Every day | ORAL | 0 refills | Status: DC
Start: 2015-08-24 — End: 2016-05-30

## 2015-08-24 MED ORDER — AMLODIPINE BESYLATE 5 MG PO TABS *I*
5.0000 mg | ORAL_TABLET | Freq: Every day | ORAL | 0 refills | Status: DC
Start: 2015-08-24 — End: 2016-06-25

## 2015-08-24 MED ORDER — DULOXETINE HCL 30 MG PO CPEP *I*
30.0000 mg | DELAYED_RELEASE_CAPSULE | Freq: Two times a day (BID) | ORAL | 0 refills | Status: DC
Start: 2015-08-24 — End: 2016-07-22

## 2015-08-24 MED ORDER — OMEPRAZOLE 20 MG PO CPDR *I*
20.0000 mg | DELAYED_RELEASE_CAPSULE | Freq: Two times a day (BID) | ORAL | 0 refills | Status: DC
Start: 2015-08-24 — End: 2016-05-30

## 2015-08-24 MED ORDER — ATORVASTATIN CALCIUM 40 MG PO TABS *I*
40.0000 mg | ORAL_TABLET | Freq: Every day | ORAL | 0 refills | Status: DC
Start: 2015-08-24 — End: 2016-06-02

## 2015-08-24 MED ORDER — CELECOXIB 200 MG PO CAPS *I*
200.0000 mg | ORAL_CAPSULE | Freq: Two times a day (BID) | ORAL | 0 refills | Status: DC
Start: 2015-08-24 — End: 2016-05-30

## 2015-08-24 MED ORDER — METOPROLOL TARTRATE 25 MG PO TABS *I*
12.5000 mg | ORAL_TABLET | Freq: Two times a day (BID) | ORAL | 0 refills | Status: DC
Start: 2015-08-24 — End: 2016-06-27

## 2015-08-24 MED ORDER — GABAPENTIN 300 MG PO CAPSULE *I*
ORAL_CAPSULE | ORAL | 0 refills | Status: DC
Start: 2015-08-24 — End: 2016-05-30

## 2015-09-07 ENCOUNTER — Ambulatory Visit: Payer: Self-pay | Admitting: Cardiology

## 2015-09-07 ENCOUNTER — Ambulatory Visit: Admission: RE | Admit: 2015-09-07 | Payer: Self-pay | Source: Ambulatory Visit | Admitting: Cardiology

## 2015-10-29 ENCOUNTER — Ambulatory Visit: Payer: Self-pay | Admitting: Neurology

## 2015-10-30 ENCOUNTER — Ambulatory Visit: Payer: Self-pay | Admitting: Primary Care

## 2016-05-06 ENCOUNTER — Ambulatory Visit: Payer: PRIVATE HEALTH INSURANCE | Admitting: Cardiology

## 2016-05-30 ENCOUNTER — Other Ambulatory Visit: Payer: Self-pay | Admitting: Primary Care

## 2016-05-30 ENCOUNTER — Other Ambulatory Visit: Payer: Self-pay | Admitting: Pediatrics

## 2016-05-30 DIAGNOSIS — M545 Low back pain, unspecified: Secondary | ICD-10-CM

## 2016-05-30 DIAGNOSIS — G8929 Other chronic pain: Secondary | ICD-10-CM

## 2016-05-30 DIAGNOSIS — M79602 Pain in left arm: Secondary | ICD-10-CM

## 2016-05-30 MED ORDER — OMEPRAZOLE 20 MG PO CPDR *I*
20.0000 mg | DELAYED_RELEASE_CAPSULE | Freq: Two times a day (BID) | ORAL | 0 refills | Status: DC
Start: 2016-05-30 — End: 2016-08-29

## 2016-05-30 MED ORDER — CELECOXIB 200 MG PO CAPS *I*
200.0000 mg | ORAL_CAPSULE | Freq: Two times a day (BID) | ORAL | 0 refills | Status: DC
Start: 2016-05-30 — End: 2016-08-29

## 2016-05-30 MED ORDER — LISINOPRIL 5 MG PO TABS *I*
5.0000 mg | ORAL_TABLET | Freq: Every day | ORAL | 0 refills | Status: DC
Start: 2016-05-30 — End: 2016-08-29

## 2016-05-30 MED ORDER — GABAPENTIN 300 MG PO CAPSULE *I*
ORAL_CAPSULE | ORAL | 0 refills | Status: DC
Start: 2016-05-30 — End: 2016-09-24

## 2016-06-02 ENCOUNTER — Other Ambulatory Visit: Payer: Self-pay | Admitting: Primary Care

## 2016-06-02 ENCOUNTER — Ambulatory Visit: Payer: PRIVATE HEALTH INSURANCE | Attending: Primary Care

## 2016-06-02 DIAGNOSIS — M79602 Pain in left arm: Secondary | ICD-10-CM

## 2016-06-02 DIAGNOSIS — Z23 Encounter for immunization: Secondary | ICD-10-CM

## 2016-06-02 MED ORDER — CYCLOBENZAPRINE HCL 10 MG PO TABS *I*
10.0000 mg | ORAL_TABLET | Freq: Three times a day (TID) | ORAL | 0 refills | Status: DC
Start: 2016-06-02 — End: 2016-11-03

## 2016-06-02 MED ORDER — ATORVASTATIN CALCIUM 40 MG PO TABS *I*
40.0000 mg | ORAL_TABLET | Freq: Every day | ORAL | 0 refills | Status: DC
Start: 2016-06-02 — End: 2016-08-29

## 2016-06-25 ENCOUNTER — Other Ambulatory Visit: Payer: Self-pay | Admitting: Pediatrics

## 2016-06-25 ENCOUNTER — Other Ambulatory Visit: Payer: Self-pay | Admitting: Primary Care

## 2016-06-25 MED ORDER — AMLODIPINE BESYLATE 5 MG PO TABS *I*
5.0000 mg | ORAL_TABLET | Freq: Every day | ORAL | 0 refills | Status: DC
Start: 2016-06-25 — End: 2016-07-29

## 2016-06-27 ENCOUNTER — Ambulatory Visit: Payer: PRIVATE HEALTH INSURANCE | Attending: Primary Care | Admitting: Primary Care

## 2016-06-27 ENCOUNTER — Encounter: Payer: Self-pay | Admitting: Primary Care

## 2016-06-27 VITALS — BP 118/74 | Ht 71.5 in | Wt 204.4 lb

## 2016-06-27 DIAGNOSIS — E7800 Pure hypercholesterolemia, unspecified: Secondary | ICD-10-CM

## 2016-06-27 DIAGNOSIS — I2510 Atherosclerotic heart disease of native coronary artery without angina pectoris: Secondary | ICD-10-CM

## 2016-06-27 DIAGNOSIS — Z125 Encounter for screening for malignant neoplasm of prostate: Secondary | ICD-10-CM

## 2016-06-27 DIAGNOSIS — R569 Unspecified convulsions: Secondary | ICD-10-CM

## 2016-06-27 DIAGNOSIS — I10 Essential (primary) hypertension: Secondary | ICD-10-CM

## 2016-06-27 LAB — PCMH DEPRESSION ASSESSMENT

## 2016-06-27 MED ORDER — METOPROLOL TARTRATE 25 MG PO TABS *I*
25.0000 mg | ORAL_TABLET | Freq: Two times a day (BID) | ORAL | 5 refills | Status: DC
Start: 2016-06-27 — End: 2016-12-18

## 2016-06-27 NOTE — Progress Notes (Signed)
S:  [name] returns to the office for follow up of his hypertension, hypercholesterolemia and CAD as well as a seizure that occurred on 01/14/16.    He moved to [place] with his wife 1 year ago.  They moved back to the local area 2 months ago and he wishes to become reestablished in our office at this point.    He continues with his usual medications including metoprolol, lisinopril, amlodipine, atorvastatin, omeprazole, gabapentin, cyclobenzaprine, Cymbalta, Voltaren gel, co-Q10, baby aspirin, Celebrex, Nitrostat and fluticasone nasal spray.  He was also initiated on Keppra following his seizure 5 months ago.  He has had no further evidence of seizures since that time.  He denies any problems with headaches, dizziness, vision changes, chest pain, palpitations, shortness of breath, heartburn, nausea, diarrhea, constipation, bleeding problems or fatigue.  He is doing his best to watch his eating habits and stay active although he does not have a regular exercise routine.  He is not in the habit of checking his blood pressures at home.    He has not completed any recent blood work.    O:  General: Alert, appropriate, pleasant man in no apparent distress.  Neck: Supple, no lymphadenopathy, no thyromegaly, 2+ carotid pulses bilaterally.  Heart: Regular rate and rhythm, no murmur.  Lungs: Clear to auscultation bilaterally.  Abdomen: Positive bowel sounds, soft, nontender, nondistended, no masses, no organomegaly.  Extremities: 2+ radial and 2+ posterior tibial pulses bilaterally.  No clubbing, cyanosis or edema.  Neuro: CN II-XII intact.  No sensory or motor deficits.  Brisk, uniform upper and lower extremity reflexes bilaterally.  5/5 muscle strength in the flexors and extensors of the shoulders, elbows, hips and knees bilaterally.    A:  1.  Essential hypertension -- well controlled  2.  Hypercholesterolemia -- due for follow up  3.  CAD -- asymptomatic  4.  Seizure disorder -- currently stable    P:  1.   Continue with current medications at their current doses.  2.  Continue to maintain efforts on prudent diet, exercise and weight management.  3.  Refer back to neurologist, Dr. Noah Charon for further evaluation and management of the seizure disorder.  4.  Refer back to cardiologist, Dr. Sydell Axon for further management of the CAD.  5.  RTO in 2 weeks for follow up of the above problems or sooner if any other problems or concerns.  6.  Complete blood work prior to the next appointment to check an FLP, CMP and PSA.

## 2016-07-04 ENCOUNTER — Other Ambulatory Visit: Payer: Self-pay | Admitting: Cardiology

## 2016-07-04 ENCOUNTER — Ambulatory Visit: Payer: PRIVATE HEALTH INSURANCE | Attending: Cardiology | Admitting: Cardiology

## 2016-07-04 ENCOUNTER — Encounter: Payer: Self-pay | Admitting: Cardiology

## 2016-07-04 VITALS — BP 118/72 | HR 61 | Ht 72.0 in | Wt 199.0 lb

## 2016-07-04 DIAGNOSIS — I25118 Atherosclerotic heart disease of native coronary artery with other forms of angina pectoris: Secondary | ICD-10-CM

## 2016-07-04 DIAGNOSIS — E785 Hyperlipidemia, unspecified: Secondary | ICD-10-CM

## 2016-07-04 DIAGNOSIS — I10 Essential (primary) hypertension: Secondary | ICD-10-CM
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
## 2016-07-05 ENCOUNTER — Other Ambulatory Visit
Admission: RE | Admit: 2016-07-05 | Discharge: 2016-07-05 | Disposition: A | Discharge status: Home / Self Care | Payer: PRIVATE HEALTH INSURANCE | Source: Ambulatory Visit | Attending: Primary Care | Admitting: Primary Care

## 2016-07-05 DIAGNOSIS — I10 Essential (primary) hypertension: Secondary | ICD-10-CM

## 2016-07-05 DIAGNOSIS — E7800 Pure hypercholesterolemia, unspecified: Secondary | ICD-10-CM

## 2016-07-05 DIAGNOSIS — Z125 Encounter for screening for malignant neoplasm of prostate: Secondary | ICD-10-CM

## 2016-07-05 DIAGNOSIS — I2510 Atherosclerotic heart disease of native coronary artery without angina pectoris: Secondary | ICD-10-CM

## 2016-07-05 LAB — LIPID PANEL
Chol/HDL Ratio: 4.3
Cholesterol: 139 mg/dL
HDL: 32 mg/dL
LDL Calculated: 80 mg/dL
Non HDL Cholesterol: 107 mg/dL
Triglycerides: 134 mg/dL

## 2016-07-05 LAB — COMPREHENSIVE METABOLIC PANEL
ALT: 30 U/L (ref 0–50)
AST: 25 U/L (ref 0–50)
Albumin: 4.8 g/dL (ref 3.5–5.2)
Alk Phos: 73 U/L (ref 40–130)
Anion Gap: 11 (ref 7–16)
Bilirubin,Total: 0.4 mg/dL (ref 0.0–1.2)
CO2: 28 mmol/L (ref 20–28)
Calcium: 9.5 mg/dL (ref 8.6–10.2)
Chloride: 103 mmol/L (ref 96–108)
Creatinine: 0.99 mg/dL (ref 0.67–1.17)
GFR,Black: 101 *
GFR,Caucasian: 87 *
Glucose: 107 mg/dL — ABNORMAL HIGH (ref 60–99)
Lab: 20 mg/dL (ref 6–20)
Potassium: 4.9 mmol/L (ref 3.3–5.1)
Sodium: 142 mmol/L (ref 133–145)
Total Protein: 7 g/dL (ref 6.3–7.7)

## 2016-07-05 LAB — PSA (EFF.4-2010): PSA (eff. 4-2010): 0.88 ng/mL (ref 0.00–4.00)

## 2016-07-08 LAB — EKG 12-LEAD
P: 54 deg
PR: 179 ms
QRS: 5 deg
QRSD: 106 ms
QT: 385 ms
QTc: 388 ms
Rate: 61 {beats}/min
Severity: NORMAL
Statement: NORMAL
T: -37 deg

## 2016-07-11 ENCOUNTER — Encounter: Payer: Self-pay | Admitting: Primary Care

## 2016-07-11 ENCOUNTER — Ambulatory Visit: Payer: PRIVATE HEALTH INSURANCE | Attending: Primary Care | Admitting: Primary Care

## 2016-07-11 VITALS — BP 124/74 | Ht 72.0 in | Wt 199.0 lb

## 2016-07-11 DIAGNOSIS — I10 Essential (primary) hypertension: Secondary | ICD-10-CM

## 2016-07-11 DIAGNOSIS — Z1211 Encounter for screening for malignant neoplasm of colon: Secondary | ICD-10-CM

## 2016-07-11 DIAGNOSIS — I2510 Atherosclerotic heart disease of native coronary artery without angina pectoris: Secondary | ICD-10-CM

## 2016-07-11 DIAGNOSIS — E7800 Pure hypercholesterolemia, unspecified: Secondary | ICD-10-CM

## 2016-07-11 NOTE — Progress Notes (Signed)
S:  Mr. Goodall returns to the office for follow up of his hypertension, hypercholesterolemia and CAD.    He continues with his usual medications including metoprolol, lisinopril, amlodipine, atorvastatin, omeprazole, Keppra, gabapentin, cyclobenzaprine, Cymbalta, Voltaren gel, co-Q10, baby aspirin, Celebrex, Nitrostat and fluticasone nasal spray.  He denies any problems with headaches, dizziness, vision changes, chest pain, palpitations, shortness of breath, heartburn, nausea, diarrhea, constipation, bleeding problems or fatigue.  He is doing his best to watch his eating habits and stay active although he does not have a regular exercise routine.  He is not in the habit of checking his blood pressures at home.    He has an appointment scheduled with neurologist, Dr. Noah Charon on 07/15/16.  He has an appointment scheduled with cardiologist, Dr. Sydell Axon next week.    O:  General: Alert, appropriate, pleasant man in no apparent distress.  Neck: Supple, no lymphadenopathy, no thyromegaly, 2+ carotid pulses bilaterally.  Heart: Regular rate and rhythm, no murmur.  Lungs: Clear to auscultation bilaterally.  Abdomen: Positive bowel sounds, soft, nontender, nondistended, no masses, no organomegaly.  Extremities: 2+ radial and 2+ posterior tibial pulses bilaterally.  No clubbing, cyanosis or edema.    Hospital Outpatient Visit on 07/05/2016   Component Date Value Ref Range Status    Cholesterol 07/05/2016 139  mg/dL Final    Comment: REFERENCE RANGE:  < 200 Desirable                  200-239 Borderline High                    > 240 High      Triglycerides 07/05/2016 134  mg/dL Final    Comment: REFERENCE RANGE:  < 150 Normal                  150-199 Borderline High                  200-499 High                    > 500 Very High      HDL 07/05/2016 32  mg/dL Final    Comment: REFERENCE RANGE:  < 40 Low                    > 60 High      LDL Calculated 07/05/2016 80  mg/dL Final    Comment: REFERENCE RANGE:  < 100  Optimal                  100-129 Near or above optimal                  130-159 Borderline High                  160-189 High                    > 189 Very High      Non HDL Cholesterol 07/05/2016 107  mg/dL Final    Target is 65m/dl above(or over) LDL goal    Chol/HDL Ratio 07/05/2016 4.3   Final    Sodium 07/05/2016 142  133 - 145 mmol/L Final    Potassium 07/05/2016 4.9  3.3 - 5.1 mmol/L Final    Chloride 07/05/2016 103  96 - 108 mmol/L Final    CO2 07/05/2016 28  20 - 28 mmol/L Final    Anion Gap 07/05/2016 11  7 - 16 Final    UN 07/05/2016 20  6 - 20 mg/dL Final    Creatinine 07/05/2016 0.99  0.67 - 1.17 mg/dL Final    GFR,Caucasian 07/05/2016 87  * Final    GFR,Black 07/05/2016 101  * Final    *UNITS=mL/min/1.73 square meters    Glucose 07/05/2016 107* 60 - 99 mg/dL Final    Comment: Reference Ranges apply only to FASTING samples.    ADA Guidelines Blood Sugar Levels for Diagnosing Diabetes & Pre-diabetes  Normal: < 100 mg/dL  Impaired Fasting Glucose (IFG): 100-125 mg/dL  Diabetes:  > 126 mg/dL on two different occasions      Calcium 07/05/2016 9.5  8.6 - 10.2 mg/dL Final    Total Protein 07/05/2016 7.0  6.3 - 7.7 g/dL Final    Albumin 07/05/2016 4.8  3.5 - 5.2 g/dL Final    Bilirubin,Total 07/05/2016 0.4  0.0 - 1.2 mg/dL Final    AST 07/05/2016 25  0 - 50 U/L Final    ALT 07/05/2016 30  0 - 50 U/L Final    Alk Phos 07/05/2016 73  40 - 130 U/L Final    PSA (eff. 09-2008) 07/05/2016 0.88  0.00 - 4.00 ng/mL Final    Comment: Total PSA, (performed by the [employer] 601  electrochemiluminescence ECLIA) results are not absolute for  the presence or absence of malignant disease and the values  obtained with different assay methods or kits can not be used  interchangeably.         A:  1.  Essential hypertension -- well controlled  2.  Hypercholesterolemia -- not optimally controlled  3.  CAD -- asymptomatic    P:  1.  Continue with current medications at their current doses.  2.  Continue to  maintain efforts on prudent diet, exercise and weight management.  Urged him to increase his efforts in this regard.  3.  Refer to gastroenterologist, Dr. Milinda Pointer for a screening colonoscopy.  4.  Proceed with the appointments with Dr. Noah Charon and Dr. Sydell Axon as scheduled.  5.  RTO in 6 months for follow up of the above problems or sooner if any other problems or concerns.  6.  Complete blood work prior to the next appointment to check an [place] and CMP.

## 2016-07-15 ENCOUNTER — Ambulatory Visit: Payer: Self-pay | Admitting: Neurology

## 2016-07-22 ENCOUNTER — Ambulatory Visit: Payer: Self-pay | Admitting: Neurology

## 2016-07-22 ENCOUNTER — Encounter: Payer: Self-pay | Admitting: Neurology

## 2016-07-22 VITALS — BP 116/70 | HR 60 | Ht 71.25 in | Wt 199.3 lb

## 2016-07-22 DIAGNOSIS — R404 Transient alteration of awareness: Secondary | ICD-10-CM

## 2016-07-22 NOTE — Progress Notes (Signed)
I saw [name] in the [facility] on 07/22/2016 for follow-up evaluation of seizures.  I last saw him on 05/01/2015.  He did not show up for his follow-up appointment with me on 10/29/2015.  I review the notes from his neurological evaluations with Dr. Cammy [name] in [place], [place] on 10/10/2015 and 01/21/2016.  I reviewed Dr. Lynnette [name] report of [name]'s EEG performed 11/16/2015.  [name] was started on levetiracetam in late July 2017 after an episode of loss of consciousness followed by exhaustion.      HPI: He tells me that [place] "just wasn't home".  His wife had accepted a job there.  When the kids were older they wanted to be in a warmer climate.  They were not near their kids or grand kids. He is taking levetiracetam 500 mg one tablet twice daily.  The only question he has is he is having problems with fatigue around noon or 1 PM.  He wonders if this is medication related.  Some days he takes a nap, some days he just pushes through it.  He takes his levetiracetam around 7 or 7:30 AM and 8 or 8:30 PM.  He sleeps alright for the most part.  Some nights are rougher than others.  He uses his CPAP, and he really notices a difference of he doesn't use it.  He has not had any further episodes of alteration of consciousness or awareness, or falling since last July.  He has occasional episodes of daydreaming.  He does a lot of photography work on his computer, and after a while he starts to see double.  This continues to happen, maybe two or three times a month, and is similar to what happened in July.  He usually stops what he is doing and rests, and it passes in five minutes or less, or other times he takes a nap and when he gets up it is better.  Prior to the episode in July he travelled to [place] with his wife.  One morning he told his wife that he did not feel right and that he could almost see double.  He asked his wife to take over the driving and he fell  asleep.  When he woke up everything seemed clear.  He continues on gabapentin 300 mg one capsule in the morning and two capsules in the evening.  He is driving.  He was advised to stop for six months, but actually only stopped driving for three and a half or four months.  He does not drive a lot now.  If his wife is around, she will not let him drive.  He is having headaches quite frequently.  He is having problems with chronic back and left elbow pain.    The active problem list, past medical/surgical history, medications, allergies, social history, and family history were reviewed with the patient and/or caregiver.  I documented/updated these history elements and marked them as reviewed in the appropriate sections of the electronic health record.    REVIEW OF SYSTEMS:  He completed a 15 point review of systems questionnaire that I reviewed today and that will be scanned into the electronic health record.     Vitals:    07/22/16 0940   BP: 116/70   BP Location: Left arm   Patient Position: Sitting   Cuff Size: adult   Pulse: 60   Weight: 90.4 kg (199 lb 4.8 oz)   Height: 1.81 m (5' 11.25")  Body mass index is 27.6 kg/(m^2).     EXAMINATION:  No formal examination was performed today.     IMPRESSION/DISCUSSION:   This is a 51 year old man here for follow-up of an episode of alteration of awareness in 2016.  I spent 20 minutes with [name] today, more than 50% of which was spent in counseling and coordination of care.  His evaluation in 2016 did not reveal abnormalities that indicate an increased risk of seizures.  He had an episode of double vision with subsequent collapse and loss of consciousness in July 2017 while living in [place].  He was started on levetiracetam at that time.  I am not sure what the cause of that episode was.  It was preceded by diplopia, which is an unusual symptom for partial onset seizures.  He continues to experience occasional diplopia when doing photography work on his  computer, and his symptoms improve with relaxation or sleep.  He continues to have problems with worsened fatigue in the afternoon.  This is not a new problem, and I do not think that this is an effect of levetiracetam.  I told [name] that I am not sure that he did have a seizure in July.  His normal repeat EEG the month prior is reassuring, but does not rule-out the possibility of seizures.  I recommend continuing levetiracetam for now, but if he has no further episodes of alteration of consciousness in the next six months, I will recommend weaning him off.      PLAN:   He will continue levetiracetam 500 mg one tablet BID.   I will see him back in six months.  I would be happy to see him sooner if needed.      Francee Piccolo, MD  Assoc. Professor of Clinical Neurology  Pager: [phone]  Office: [phone]  07/22/2016 9:23 AM

## 2016-07-29 ENCOUNTER — Other Ambulatory Visit: Payer: Self-pay | Admitting: Primary Care

## 2016-07-29 ENCOUNTER — Other Ambulatory Visit: Payer: Self-pay | Admitting: Pediatrics

## 2016-08-18 ENCOUNTER — Encounter: Payer: Self-pay | Admitting: Geriatric Medicine

## 2016-08-18 ENCOUNTER — Observation Stay
Admission: EM | Admit: 2016-08-18 | Discharge: 2016-08-18 | Disposition: A | Discharge status: Home / Self Care | Payer: PRIVATE HEALTH INSURANCE | Source: Ambulatory Visit | Attending: Emergency Medicine | Admitting: Emergency Medicine

## 2016-08-18 ENCOUNTER — Other Ambulatory Visit: Payer: Self-pay | Admitting: Gastroenterology

## 2016-08-18 ENCOUNTER — Telehealth: Payer: Self-pay

## 2016-08-18 DIAGNOSIS — Z87891 Personal history of nicotine dependence: Secondary | ICD-10-CM | POA: Insufficient documentation

## 2016-08-18 DIAGNOSIS — R079 Chest pain, unspecified: Secondary | ICD-10-CM | POA: Diagnosis present

## 2016-08-18 DIAGNOSIS — I25119 Atherosclerotic heart disease of native coronary artery with unspecified angina pectoris: Secondary | ICD-10-CM

## 2016-08-18 DIAGNOSIS — I10 Essential (primary) hypertension: Principal | ICD-10-CM | POA: Insufficient documentation

## 2016-08-18 DIAGNOSIS — R6884 Jaw pain: Secondary | ICD-10-CM

## 2016-08-18 DIAGNOSIS — I25118 Atherosclerotic heart disease of native coronary artery with other forms of angina pectoris: Secondary | ICD-10-CM

## 2016-08-18 DIAGNOSIS — R42 Dizziness and giddiness: Secondary | ICD-10-CM

## 2016-08-18 DIAGNOSIS — Z9861 Coronary angioplasty status: Secondary | ICD-10-CM | POA: Insufficient documentation

## 2016-08-18 DIAGNOSIS — R0789 Other chest pain: Secondary | ICD-10-CM

## 2016-08-18 DIAGNOSIS — I499 Cardiac arrhythmia, unspecified: Secondary | ICD-10-CM

## 2016-08-18 DIAGNOSIS — E785 Hyperlipidemia, unspecified: Secondary | ICD-10-CM | POA: Diagnosis present

## 2016-08-18 DIAGNOSIS — R110 Nausea: Secondary | ICD-10-CM

## 2016-08-18 DIAGNOSIS — I2510 Atherosclerotic heart disease of native coronary artery without angina pectoris: Secondary | ICD-10-CM | POA: Diagnosis present

## 2016-08-18 HISTORY — DX: Unspecified convulsions: R56.9

## 2016-08-18 HISTORY — DX: Unspecified osteoarthritis, unspecified site: M19.90

## 2016-08-18 LAB — BASIC METABOLIC PANEL
Anion Gap: 9 (ref 7–16)
CO2: 27 mmol/L (ref 20–28)
Calcium: 9.4 mg/dL (ref 8.6–10.2)
Chloride: 107 mmol/L (ref 96–108)
Creatinine: 0.84 mg/dL (ref 0.67–1.17)
GFR,Black: 117 *
GFR,Caucasian: 101 *
Glucose: 98 mg/dL (ref 60–99)
Lab: 19 mg/dL (ref 6–20)
Potassium: 4.6 mmol/L (ref 3.3–5.1)
Sodium: 143 mmol/L (ref 133–145)

## 2016-08-18 LAB — CBC AND DIFFERENTIAL
Baso # K/uL: 0 10*3/uL (ref 0.0–0.1)
Basophil %: 0.3 %
Eos # K/uL: 0.1 10*3/uL (ref 0.0–0.5)
Eosinophil %: 1 %
Hematocrit: 44 % (ref 40–51)
Hemoglobin: 15 g/dL (ref 13.7–17.5)
IMM Granulocytes #: 0 10*3/uL (ref 0.0–0.1)
IMM Granulocytes: 0.2 %
Lymph # K/uL: 1.8 10*3/uL (ref 1.3–3.6)
Lymphocyte %: 29.7 %
MCH: 32 pg (ref 26–32)
MCHC: 34 g/dL (ref 32–37)
MCV: 93 fL — ABNORMAL HIGH (ref 79–92)
Mono # K/uL: 0.5 10*3/uL (ref 0.3–0.8)
Monocyte %: 8.4 %
Neut # K/uL: 3.7 10*3/uL (ref 1.8–5.4)
Nucl RBC # K/uL: 0 10*3/uL (ref 0.0–0.0)
Nucl RBC %: 0 /100 WBC (ref 0.0–0.2)
Platelets: 271 10*3/uL (ref 150–330)
RBC: 4.7 MIL/uL (ref 4.6–6.1)
RDW: 12.3 % (ref 11.6–14.4)
Seg Neut %: 60.4 %
WBC: 6.1 10*3/uL (ref 4.2–9.1)

## 2016-08-18 LAB — TROPONIN T
Troponin T: <0.01 ng/mL (ref 0.00–0.02)
Troponin T: <0.01 ng/mL (ref 0.00–0.02)

## 2016-08-18 LAB — PROTIME-INR
INR: 1 (ref 0.9–1.1)
Protime: 12 s (ref 10.0–12.9)

## 2016-08-18 MED ORDER — PANTOPRAZOLE SODIUM 40 MG PO TBEC *I*
40.0000 mg | DELAYED_RELEASE_TABLET | Freq: Two times a day (BID) | ORAL | Status: DC
Start: 2016-08-18 — End: 2016-08-19

## 2016-08-18 MED ORDER — ASPIRIN 81 MG PO CHEW *I*
243.0000 mg | CHEWABLE_TABLET | Freq: Once | ORAL | Status: AC
Start: 2016-08-18 — End: 2016-08-18
  Administered 2016-08-18: 243 mg via ORAL
  Filled 2016-08-18: qty 3

## 2016-08-18 MED ORDER — LEVETIRACETAM 500 MG PO TABS *I*
500.0000 mg | ORAL_TABLET | Freq: Two times a day (BID) | ORAL | Status: DC
Start: 2016-08-18 — End: 2016-08-19

## 2016-08-18 MED ORDER — ATORVASTATIN CALCIUM 40 MG PO TABS *I*
40.0000 mg | ORAL_TABLET | Freq: Every day | ORAL | Status: DC
Start: 2016-08-18 — End: 2016-08-18

## 2016-08-18 MED ORDER — CELECOXIB 200 MG PO CAPS *I*
200.0000 mg | ORAL_CAPSULE | Freq: Two times a day (BID) | ORAL | Status: DC
Start: 2016-08-18 — End: 2016-08-19

## 2016-08-18 MED ORDER — MORPHINE SULFATE 2 MG/ML IV SOLN *WRAPPED*
2.0000 mg | Freq: Once | Status: AC
Start: 2016-08-18 — End: 2016-08-18
  Administered 2016-08-18: 2 mg via INTRAVENOUS
  Filled 2016-08-18: qty 1

## 2016-08-18 MED ORDER — NITROGLYCERIN 2 % TD OINT *I*
0.5000 [in_us] | TOPICAL_OINTMENT | Freq: Once | TRANSDERMAL | Status: AC
Start: 2016-08-18 — End: 2016-08-18
  Administered 2016-08-18: 0.5 [in_us] via TOPICAL
  Filled 2016-08-18: qty 1

## 2016-08-18 MED ORDER — ATORVASTATIN CALCIUM 40 MG PO TABS *I*
40.0000 mg | ORAL_TABLET | Freq: Every day | ORAL | Status: DC
Start: 2016-08-19 — End: 2016-08-19

## 2016-08-18 MED ORDER — GABAPENTIN 300 MG PO CAPSULE *I*
300.0000 mg | ORAL_CAPSULE | Freq: Every day | ORAL | Status: DC
Start: 2016-08-18 — End: 2016-08-19

## 2016-08-18 MED ORDER — CYCLOBENZAPRINE HCL 10 MG PO TABS *I*
10.0000 mg | ORAL_TABLET | Freq: Every evening | ORAL | Status: DC
Start: 2016-08-18 — End: 2016-08-19

## 2016-08-18 MED ORDER — DULOXETINE HCL 30 MG PO CPEP *I*
60.0000 mg | DELAYED_RELEASE_CAPSULE | Freq: Every evening | ORAL | Status: DC
Start: 2016-08-18 — End: 2016-08-19

## 2016-08-18 MED ORDER — METOPROLOL TARTRATE 25 MG PO TABS *I*
25.0000 mg | ORAL_TABLET | Freq: Two times a day (BID) | ORAL | Status: DC
Start: 2016-08-18 — End: 2016-08-19

## 2016-08-18 MED ORDER — ASPIRIN 81 MG PO TBEC *I*
81.0000 mg | DELAYED_RELEASE_TABLET | Freq: Every day | ORAL | Status: DC
Start: 2016-08-18 — End: 2016-08-19

## 2016-08-18 MED ORDER — AMLODIPINE BESYLATE 5 MG PO TABS *I*
5.0000 mg | ORAL_TABLET | Freq: Every day | ORAL | Status: DC
Start: 2016-08-18 — End: 2016-08-19

## 2016-08-18 MED ORDER — LISINOPRIL 5 MG PO TABS *I*
5.0000 mg | ORAL_TABLET | Freq: Every day | ORAL | Status: DC
Start: 2016-08-18 — End: 2016-08-19

## 2016-08-18 MED ORDER — GABAPENTIN 300 MG PO CAPSULE *I*
600.0000 mg | ORAL_CAPSULE | Freq: Every evening | ORAL | Status: DC
Start: 2016-08-18 — End: 2016-08-19

## 2016-08-18 NOTE — Telephone Encounter (Signed)
Spoke with patient reports that approximately 9:30 pm on March 4 he had substernal chest cramping which she was unable to reproduce with physical movement, palpation or deep inspiration.    He awoke at 6:45 in the morning with a pulsating sharp left neck pain with increased intensity of his chest cramping.  This was similar to his presentation in 2012 of a STEMI although he notes not as intense.  He had taken one dose of sublingual nitro with mild resolution and took a second dose at around 7 AM with resolution of discomfort.    Zenia Resides was driving to take photography in [place] and while he was driving and walking (9am) .  Noted recurrence of the cramping feeling, brief lightheadedness and nausea with an episode of hot flash.      He has constant anxiety and concern that any recurrent symptoms may be due to a repeat cardiac event .  (Most recent coronary angiography done September 2017 with widely patent bare metal mid LAD stent and mild disease of the left circumflex-see care everywhere cath report)    While low likelihood of ACS he will present to [facility] emergency department for EKG and troponin levels.  Suspect this morning's presentation  Of lightheadedness may have been due to transient hypotension as patient is compliant with beta blocker, calcium channel blocker, ACE inhibitor and recent sublingual nitro use.    Call head was placed to [facility].  Inpatient cardiology consultation if needed.  Pending results will arrange for further follow-up as outpatient with Dr. Bary Leriche, NP

## 2016-08-18 NOTE — ED Notes (Signed)
Pt comes into ED for CP that began last night. Pt states he woke up in the middle of the night with CP and was able to gt back to sleep once pain ended. Pt felt pain again this morning when he woke up and states he took a nitro and it CP subsided again. Pt states that when pain began for a third time he called his PCP and was advised to come into ED. Pt has hx of MI. Pt denies leg swelling but reports dizziness, light headedness, SOB and nausea at times of CP. Pt is sitting comfortably in bed. EKG done and call bell within reach.

## 2016-08-18 NOTE — ED Notes (Addendum)
Pt ambulated from ED to room without difficulty. Pt reports 1/10 chest discomfort non-radiating. Denies having a headache, SOB, nausea, light headedness, or dizziness. Pt oriented to room, call bell, and unit. Nursing to continue to monitor. Prudence Davidson, RN

## 2016-08-18 NOTE — ED Obs Notes (Signed)
ED OBSERVATION FOLLOW-UP NOTE    Patient:  [name]    Patient remains pain free and as a second negative troponin  Cardiology consulted patient  Follow up with MD and cardiology    Author: Kennyth Lose Hussain Maimone, PA  Note created: 08/18/2016  at: 6:51 PM     Adelfa Koh, [place]  08/18/16 [phone]
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
## 2016-08-18 NOTE — ED Notes (Signed)
08/18/16 1335   Observation Care   Observation care initiated  Yes   Patient has been verbally notified of their observation status Yes

## 2016-08-18 NOTE — ED Notes (Signed)
Discharge instructions reviewed with pt and wife. Pt and wife verbalized understanding. Pt is encouraged to follow-up with PCP in 3 days and cardiologist in 1 week. Pt and wife feels comfortable with discharge plan. No questions at this time. IV removed; pt discharged. Prudence Davidson, RN

## 2016-08-18 NOTE — ED Triage Notes (Signed)
took nitro for CP this am and after 2 hours it has returned + CP and SOB and dizziness +cardiac  hx   Triage Note   Marcy Panning, RN

## 2016-08-18 NOTE — Progress Notes (Signed)
08/18/16 1400   UM Patient Class Review   Patient Class Review Observation

## 2016-08-18 NOTE — ED Notes (Signed)
08/18/16 1045   OTHER   ED Service [facility]   PCP/Service Referral Julie/Cardiology   Pt Info note/Reason for sending ACS R/O, Ahmed Prima Telephone encounter with info   Call reported to [facility]

## 2016-08-18 NOTE — Discharge Instructions (Signed)
Take your medications as directed   See MD/ cardiology this week  Well balanced diet
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
## 2016-08-18 NOTE — Telephone Encounter (Signed)
Pt states that left side of chest feels "crampy" and radiating to left side of neck. Pt states that he feels dizzy when he moves around. Pt took nitro las tnight and it did relieve some.

## 2016-08-22 LAB — EKG 12-LEAD
P: 52 deg
PR: 184 ms
QRS: -8 deg
QRSD: 102 ms
QT: 396 ms
QTc: 393 ms
Rate: 59 {beats}/min
Severity: NORMAL
T: -40 deg

## 2016-08-27 ENCOUNTER — Ambulatory Visit: Payer: PRIVATE HEALTH INSURANCE | Attending: Primary Care | Admitting: Primary Care

## 2016-08-27 ENCOUNTER — Encounter: Payer: Self-pay | Admitting: Primary Care

## 2016-08-27 VITALS — BP 94/70 | HR 79 | Temp 99.9°F | Ht 71.0 in | Wt 201.4 lb

## 2016-08-27 DIAGNOSIS — J069 Acute upper respiratory infection, unspecified: Secondary | ICD-10-CM

## 2016-08-28 NOTE — Progress Notes (Signed)
S:  [name] presents to the office with complaints of chest congestion for the past 9 days.    He has had a stuffy nose, runny nose, mild bilateral ear pressure, bilateral maxillary sinus pressure, postnasal drip, a productive cough, mild intermittent wheezing and shortness of breath, burning in his chest, intermittent lightheadedness, fevers, chills, sweats and body aches.  He had a sore throat 2 days ago which has since subsided.  He was aware of tightness in his chest last week and went to the [facility] ED on 08/18/16 for evaluation.  He ruled out for MI and there was no evidence of myocardial ischemia.    He denies any headaches, heartburn, nausea, vomiting or diarrhea.  He has used Vicks Vapo-Rub and Halls lozenges for his symptoms with some temporary benefit.  He is no longer a smoker.    He continues with his usual medications including metoprolol, lisinopril, amlodipine, atorvastatin, omeprazole, Keppra, gabapentin, cyclobenzaprine, Cymbalta, Voltaren gel, co-Q10, baby aspirin, Celebrex, Nitrostat and fluticasone nasal spray.    O:  General: Alert, appropriate, pleasant man in no apparent distress.  HEENT: Sclera and conjunctiva clear, TMs WNL.  Nasal mucosa with mild erythema, edema and mucous congestion.  Mild peritonsillar erythema but no tonsillar enlargement or exudates.  No frontal or maxillary sinus tenderness.  Neck: Supple, no lymphadenopathy, no thyromegaly, 2+ carotid pulses bilaterally.  Heart: Regular rate and rhythm, no murmur.  Lungs: Clear to auscultation bilaterally.  Abdomen: Positive bowel sounds, soft, nontender, nondistended, no masses, no organomegaly.  Extremities: 2+ radial and 2+ posterior tibial pulses bilaterally.  No clubbing, cyanosis or edema.    A:  URI    P:  1.  Recommended OTCs, drink adequate fluids, vitamins, rest, salt water gargles and vaporizer at bedside at night as needed for management of symptoms.  2.  RTO in 1 week if no improvement in symptoms or  sooner if symptoms worsen.

## 2016-08-29 ENCOUNTER — Other Ambulatory Visit: Payer: Self-pay | Admitting: Primary Care

## 2016-08-29 DIAGNOSIS — M545 Low back pain: Secondary | ICD-10-CM

## 2016-08-29 DIAGNOSIS — G8929 Other chronic pain: Secondary | ICD-10-CM

## 2016-09-01 ENCOUNTER — Other Ambulatory Visit: Payer: Self-pay | Admitting: Primary Care

## 2016-09-01 MED ORDER — LEVETIRACETAM 500 MG PO TABS *I*
500.0000 mg | ORAL_TABLET | Freq: Two times a day (BID) | ORAL | 11 refills | Status: DC
Start: 2016-09-01 — End: 2017-01-26

## 2016-09-02 ENCOUNTER — Encounter: Payer: Self-pay | Admitting: Cardiology

## 2016-09-02 ENCOUNTER — Ambulatory Visit: Payer: PRIVATE HEALTH INSURANCE | Attending: Cardiology | Admitting: Cardiology

## 2016-09-02 VITALS — BP 136/82 | HR 64 | Ht 71.0 in | Wt 200.0 lb

## 2016-09-02 DIAGNOSIS — R002 Palpitations: Secondary | ICD-10-CM

## 2016-09-02 DIAGNOSIS — I25119 Atherosclerotic heart disease of native coronary artery with unspecified angina pectoris: Secondary | ICD-10-CM

## 2016-09-02 DIAGNOSIS — I10 Essential (primary) hypertension: Secondary | ICD-10-CM

## 2016-09-02 DIAGNOSIS — R079 Chest pain, unspecified: Secondary | ICD-10-CM
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
## 2016-09-20 ENCOUNTER — Encounter: Payer: Self-pay | Admitting: Cardiology

## 2016-09-21 ENCOUNTER — Encounter: Payer: Self-pay | Admitting: Cardiology

## 2016-09-21 DIAGNOSIS — I499 Cardiac arrhythmia, unspecified: Secondary | ICD-10-CM

## 2016-09-22 ENCOUNTER — Emergency Department: Payer: PRIVATE HEALTH INSURANCE

## 2016-09-22 ENCOUNTER — Observation Stay
Admission: EM | Admit: 2016-09-22 | Discharge: 2016-09-23 | Disposition: A | Discharge status: Home / Self Care | Payer: PRIVATE HEALTH INSURANCE | Source: Ambulatory Visit | Attending: Emergency Medicine | Admitting: Emergency Medicine

## 2016-09-22 ENCOUNTER — Encounter: Payer: Self-pay | Admitting: Emergency Medicine

## 2016-09-22 ENCOUNTER — Other Ambulatory Visit: Payer: Self-pay | Admitting: Cardiology

## 2016-09-22 ENCOUNTER — Other Ambulatory Visit: Payer: PRIVATE HEALTH INSURANCE

## 2016-09-22 ENCOUNTER — Telehealth: Payer: Self-pay

## 2016-09-22 ENCOUNTER — Other Ambulatory Visit: Payer: Self-pay

## 2016-09-22 DIAGNOSIS — R110 Nausea: Secondary | ICD-10-CM

## 2016-09-22 DIAGNOSIS — R079 Chest pain, unspecified: Principal | ICD-10-CM | POA: Insufficient documentation

## 2016-09-22 DIAGNOSIS — I119 Hypertensive heart disease without heart failure: Secondary | ICD-10-CM | POA: Insufficient documentation

## 2016-09-22 DIAGNOSIS — I2510 Atherosclerotic heart disease of native coronary artery without angina pectoris: Secondary | ICD-10-CM | POA: Insufficient documentation

## 2016-09-22 DIAGNOSIS — K219 Gastro-esophageal reflux disease without esophagitis: Secondary | ICD-10-CM | POA: Insufficient documentation

## 2016-09-22 DIAGNOSIS — R42 Dizziness and giddiness: Secondary | ICD-10-CM

## 2016-09-22 DIAGNOSIS — R202 Paresthesia of skin: Secondary | ICD-10-CM | POA: Insufficient documentation

## 2016-09-22 DIAGNOSIS — R61 Generalized hyperhidrosis: Secondary | ICD-10-CM

## 2016-09-22 DIAGNOSIS — R918 Other nonspecific abnormal finding of lung field: Secondary | ICD-10-CM

## 2016-09-22 DIAGNOSIS — Z9861 Coronary angioplasty status: Secondary | ICD-10-CM | POA: Insufficient documentation

## 2016-09-22 DIAGNOSIS — Z87891 Personal history of nicotine dependence: Secondary | ICD-10-CM

## 2016-09-22 DIAGNOSIS — M549 Dorsalgia, unspecified: Secondary | ICD-10-CM

## 2016-09-22 DIAGNOSIS — R072 Precordial pain: Secondary | ICD-10-CM

## 2016-09-22 HISTORY — DX: Essential (primary) hypertension: I10

## 2016-09-22 HISTORY — DX: Sleep apnea, unspecified: G47.30

## 2016-09-22 HISTORY — DX: Heart failure, unspecified: I50.9

## 2016-09-22 LAB — TROPONIN T
Troponin T: <0.01 ng/mL (ref 0.00–0.02)
Troponin T: <0.01 ng/mL (ref 0.00–0.02)

## 2016-09-22 LAB — PLASMA PROF 7 (ED ONLY)
Anion Gap,PL: 10 (ref 7–16)
CO2,Plasma: 28 mmol/L (ref 20–28)
Chloride,Plasma: 102 mmol/L (ref 96–108)
Creatinine: 0.89 mg/dL (ref 0.67–1.17)
GFR,Black: 114 *
GFR,Caucasian: 98 *
Glucose,Plasma: 98 mg/dL (ref 60–99)
Potassium,Plasma: 5.4 mmol/L — ABNORMAL HIGH (ref 3.4–4.7)
Sodium,Plasma: 140 mmol/L (ref 133–145)
UN,Plasma: 14 mg/dL (ref 6–20)

## 2016-09-22 LAB — CBC AND DIFFERENTIAL
Baso # K/uL: 0 10*3/uL (ref 0.0–0.1)
Basophil %: 0.5 %
Eos # K/uL: 0.1 10*3/uL (ref 0.0–0.5)
Eosinophil %: 1.4 %
Hematocrit: 45 % (ref 40–51)
Hemoglobin: 15.1 g/dL (ref 13.7–17.5)
IMM Granulocytes #: 0 10*3/uL (ref 0.0–0.1)
IMM Granulocytes: 0.3 %
Lymph # K/uL: 1.9 10*3/uL (ref 1.3–3.6)
Lymphocyte %: 31.8 %
MCH: 32 pg/cell (ref 26–32)
MCHC: 34 g/dL (ref 32–37)
MCV: 95 fL — ABNORMAL HIGH (ref 79–92)
Mono # K/uL: 0.5 10*3/uL (ref 0.3–0.8)
Monocyte %: 7.9 %
Neut # K/uL: 3.4 10*3/uL (ref 1.8–5.4)
Nucl RBC # K/uL: 0 10*3/uL (ref 0.0–0.0)
Nucl RBC %: 0 /100 WBC (ref 0.0–0.2)
Platelets: 258 10*3/uL (ref 150–330)
RBC: 4.7 MIL/uL (ref 4.6–6.1)
RDW: 12.7 % (ref 11.6–14.4)
Seg Neut %: 58.1 %
WBC: 5.9 10*3/uL (ref 4.2–9.1)

## 2016-09-22 LAB — POTASSIUM: Potassium: 4 mmol/L (ref 3.3–5.1)

## 2016-09-22 LAB — HOLD SST

## 2016-09-22 LAB — HOLD BLUE

## 2016-09-22 MED ORDER — MORPHINE SULFATE 4 MG/ML IV SOLN *WRAPPED*
4.0000 mg | Freq: Once | INTRAVENOUS | Status: AC
Start: 2016-09-22 — End: 2016-09-22
  Administered 2016-09-22: 4 mg via INTRAVENOUS
  Filled 2016-09-22: qty 1

## 2016-09-22 MED ORDER — CYCLOBENZAPRINE HCL 10 MG PO TABS *I*
10.0000 mg | ORAL_TABLET | Freq: Every evening | ORAL | Status: DC | PRN
Start: 2016-09-22 — End: 2016-09-23
  Administered 2016-09-22: 10 mg via ORAL
  Filled 2016-09-22: qty 1

## 2016-09-22 MED ORDER — AMLODIPINE BESYLATE 5 MG PO TABS *I*
5.0000 mg | ORAL_TABLET | Freq: Every day | ORAL | Status: DC
Start: 2016-09-23 — End: 2016-09-23
  Administered 2016-09-23: 5 mg via ORAL
  Filled 2016-09-22: qty 1

## 2016-09-22 MED ORDER — OMEPRAZOLE 20 MG PO CPDR *I*
20.0000 mg | DELAYED_RELEASE_CAPSULE | Freq: Two times a day (BID) | ORAL | Status: DC
Start: 2016-09-22 — End: 2016-09-23
  Administered 2016-09-22 – 2016-09-23 (×2): 20 mg via ORAL
  Filled 2016-09-22 (×4): qty 1

## 2016-09-22 MED ORDER — NITROGLYCERIN 0.4 MG SL SUBL *I*
0.4000 mg | SUBLINGUAL_TABLET | SUBLINGUAL | Status: AC | PRN
Start: 2016-09-22 — End: 2016-09-22
  Administered 2016-09-22: 0.4 mg via SUBLINGUAL
  Filled 2016-09-22: qty 1

## 2016-09-22 MED ORDER — METOPROLOL TARTRATE 25 MG PO TABS *I*
25.0000 mg | ORAL_TABLET | Freq: Two times a day (BID) | ORAL | Status: DC
Start: 2016-09-22 — End: 2016-09-23
  Administered 2016-09-22: 25 mg via ORAL
  Filled 2016-09-22 (×2): qty 1

## 2016-09-22 MED ORDER — MAGNESIUM-OXIDE 400 (241.3 MG) MG PO TABS *WRAPPED*
400.0000 mg | ORAL_TABLET | Freq: Every day | ORAL | Status: DC
Start: 2016-09-23 — End: 2016-09-23
  Administered 2016-09-23: 400 mg via ORAL
  Filled 2016-09-22: qty 1

## 2016-09-22 MED ORDER — DULOXETINE HCL 30 MG PO CPEP *I*
60.0000 mg | DELAYED_RELEASE_CAPSULE | Freq: Every day | ORAL | Status: DC
Start: 2016-09-22 — End: 2016-09-23
  Administered 2016-09-22: 60 mg via ORAL
  Filled 2016-09-22 (×3): qty 2

## 2016-09-22 MED ORDER — ASPIRIN 81 MG PO TBEC *I*
81.0000 mg | DELAYED_RELEASE_TABLET | Freq: Every day | ORAL | Status: DC
Start: 2016-09-23 — End: 2016-09-23
  Administered 2016-09-23: 81 mg via ORAL
  Filled 2016-09-22: qty 1

## 2016-09-22 MED ORDER — LISINOPRIL 5 MG PO TABS *I*
5.0000 mg | ORAL_TABLET | Freq: Every day | ORAL | Status: DC
Start: 2016-09-23 — End: 2016-09-23
  Administered 2016-09-23: 5 mg via ORAL
  Filled 2016-09-22: qty 1

## 2016-09-22 MED ORDER — LEVETIRACETAM 500 MG PO TABS *I*
500.0000 mg | ORAL_TABLET | Freq: Two times a day (BID) | ORAL | Status: DC
Start: 2016-09-22 — End: 2016-09-23
  Administered 2016-09-22 – 2016-09-23 (×2): 500 mg via ORAL
  Filled 2016-09-22 (×4): qty 1

## 2016-09-22 MED ORDER — IOHEXOL 350 MG/ML (OMNIPAQUE) IV SOLN *I*
50.1000 mL | Freq: Once | INTRAVENOUS | Status: AC
Start: 2016-09-22 — End: 2016-09-22
  Administered 2016-09-22: 70 mL via INTRAVENOUS

## 2016-09-22 MED ORDER — LORAZEPAM 0.5 MG PO TABS *I*
0.5000 mg | ORAL_TABLET | Freq: Three times a day (TID) | ORAL | Status: DC | PRN
Start: 2016-09-22 — End: 2016-09-23

## 2016-09-22 MED ORDER — GABAPENTIN 300 MG PO CAPSULE *I*
300.0000 mg | ORAL_CAPSULE | Freq: Every morning | ORAL | Status: DC
Start: 2016-09-23 — End: 2016-09-23
  Administered 2016-09-23: 300 mg via ORAL
  Filled 2016-09-22: qty 1

## 2016-09-22 MED ORDER — CELECOXIB 200 MG PO CAPS *I*
200.0000 mg | ORAL_CAPSULE | Freq: Two times a day (BID) | ORAL | Status: DC
Start: 2016-09-22 — End: 2016-09-23
  Administered 2016-09-22 – 2016-09-23 (×2): 200 mg via ORAL
  Filled 2016-09-22 (×4): qty 1

## 2016-09-22 MED ORDER — COENZYME Q10 100 MG ORAL SOLID WRAPPED *I*
100.0000 mg | Freq: Every day | ORAL | Status: DC
Start: 2016-09-23 — End: 2016-09-23
  Administered 2016-09-23: 100 mg via ORAL
  Filled 2016-09-22: qty 1

## 2016-09-22 MED ORDER — ATORVASTATIN CALCIUM 40 MG PO TABS *I*
40.0000 mg | ORAL_TABLET | Freq: Every evening | ORAL | Status: DC
Start: 2016-09-22 — End: 2016-09-23
  Administered 2016-09-22: 40 mg via ORAL
  Filled 2016-09-22: qty 1

## 2016-09-22 MED ORDER — GABAPENTIN 300 MG PO CAPSULE *I*
600.0000 mg | ORAL_CAPSULE | Freq: Every evening | ORAL | Status: DC
Start: 2016-09-22 — End: 2016-09-23
  Administered 2016-09-22: 600 mg via ORAL
  Filled 2016-09-22: qty 2
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
## 2016-09-22 NOTE — ED Provider Progress Notes (Signed)
ED Provider Progress Note    Initial troponin negative, EKG repeated and shows no progressive ischemic changes, CTA chest negative for aortic dissection. There is a questionable opacity on CXR but he has no fevers or leukocytosis and hx not consistent with pneumonia so will not plan on treating with antibiotics. Plan for placement in Observation for ongoing ACS rule out.      Kris Mouton, PA, 09/22/2016, 2:26 PM    Supervising physician Jeannett Senior was immediately available     Kris Mouton, [place]  09/22/16 1427

## 2016-09-22 NOTE — ED Notes (Signed)
Plan of Care     Reviewed with pt and includes:     Continuous tele   Trop/ekg at 1800 and 6579   VS per policy   Medication administration per [facility]   Pain control   NPO after MN for cards consult   Maintain safety and comfort

## 2016-09-22 NOTE — ED Notes (Signed)
Pt presents from home with complaints of chest pain that began four days ago. This morning pt noted diaphoresis with chest pain, which prompted him to call EMS. Pt took nitrates at home prior to admission that improved chest pain. Pt currently appears comfortable and is NAD. Wife is at pt bedside.

## 2016-09-22 NOTE — Telephone Encounter (Signed)
noted 
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
## 2016-09-22 NOTE — ED Notes (Signed)
Pt arrived on EOU from ed via w/c at approximately 1810, transferred independently. Pt A&Ox4, pleasant, appears in NAD, wife at bedside. Reviewed POC, call light system and location of BR, voiced understanding. Call light left within reach.

## 2016-09-22 NOTE — ED Notes (Signed)
Report Given To  Malachy Mood, RN OBS       Descriptive Sentence / Reason for Admission   Pt presents to ED with intermittent chest pain that has been going on for approx 4 days. Pain also radiates down his back and between his shoulder blades at times. Attempted Nitro with mild relief.   Pt has prior hx of STEMI.        Active Issues / Relevant Events   Trop neg        To Do List  CARDS consult in am   Q 4 hr vitals       Anticipatory Guidance / Discharge Planning  Admit Chest pain

## 2016-09-22 NOTE — ED Notes (Signed)
ED RN [place], RN (RN) reviewed the following charting information by the RN intern: [name]    Nursing Assessments  Medications  Plan of Care  Teaching   Notes    In the chart of [name] (51 y.o. male) and attest to the charting being accurate.

## 2016-09-22 NOTE — ED Triage Notes (Signed)
Extensive Cardiac hx / four days of chest pain at home / relieved with slntg / ekg done       Triage Note   Danielle Rankin, RN

## 2016-09-22 NOTE — Telephone Encounter (Signed)
FYI - Patient states he's had chest pain for the past 4 days.  Right now he states it feels like someone is sitting on his chest. Patient feels hot, face is flushed & hot.  Patient is on his way [facility] ED.

## 2016-09-22 NOTE — Progress Notes (Addendum)
09/22/16 1500   UM Patient Class Review   Patient Class Review Observation   Notification of observation level of care presented to patient with verbalized understanding afterward.  Acknowledgment of receipt signed and copy given to patient and signed copy placed in medical record.

## 2016-09-23 ENCOUNTER — Other Ambulatory Visit: Payer: Self-pay | Admitting: Cardiology

## 2016-09-23 DIAGNOSIS — Z955 Presence of coronary angioplasty implant and graft: Secondary | ICD-10-CM

## 2016-09-23 DIAGNOSIS — I2510 Atherosclerotic heart disease of native coronary artery without angina pectoris: Secondary | ICD-10-CM

## 2016-09-23 DIAGNOSIS — I10 Essential (primary) hypertension: Secondary | ICD-10-CM

## 2016-09-23 DIAGNOSIS — G4733 Obstructive sleep apnea (adult) (pediatric): Secondary | ICD-10-CM

## 2016-09-23 DIAGNOSIS — R079 Chest pain, unspecified: Secondary | ICD-10-CM

## 2016-09-23 DIAGNOSIS — E7800 Pure hypercholesterolemia, unspecified: Secondary | ICD-10-CM

## 2016-09-23 LAB — BASIC METABOLIC PANEL
Anion Gap: 13 (ref 7–16)
CO2: 27 mmol/L (ref 20–28)
Calcium: 9.2 mg/dL (ref 8.6–10.2)
Chloride: 103 mmol/L (ref 96–108)
Creatinine: 0.93 mg/dL (ref 0.67–1.17)
GFR,Black: 109 *
GFR,Caucasian: 94 *
Glucose: 96 mg/dL (ref 60–99)
Lab: 14 mg/dL (ref 6–20)
Potassium: 4.4 mmol/L (ref 3.3–5.1)
Sodium: 143 mmol/L (ref 133–145)

## 2016-09-23 LAB — LIPID PANEL
Chol/HDL Ratio: 4.6
Cholesterol: 133 mg/dL
HDL: 29 mg/dL
LDL Calculated: 60 mg/dL
Non HDL Cholesterol: 104 mg/dL
Triglycerides: 218 mg/dL — AB

## 2016-09-23 LAB — TROPONIN T: Troponin T: <0.01 ng/mL (ref 0.00–0.02)

## 2016-09-23 NOTE — Progress Notes (Signed)
09/23/16 1400   UM Patient Class Review   Patient Class Review Observation     Signa Kell, RN

## 2016-09-23 NOTE — Discharge Instructions (Signed)
DRINK plenty of fluids, water, juices, sport drinks, etc.,at least 2-3 quarts per day    Get plenty of REST*    Continue your regular medications as directed*
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
## 2016-09-23 NOTE — ED Notes (Signed)
Patient resting comfortably. Alert and oriented. Does not appear to be in distress. States he still has chest pressure, but not really pain. Routine medications given to patient. Respiratory here to set up CPAP. Otherwise doing well. Call bell/bedside table within reach. Will continue to monitor patient and treat per orders.

## 2016-09-23 NOTE — ED Notes (Signed)
Assumed care of pt at 0800, assessed at this time, d/c reviewed with patient, voiced understanding.

## 2016-09-23 NOTE — ED Obs Notes (Signed)
ED OBSERVATION DISCHARGE NOTE    Patient seen by me today, 09/23/2016 at 1015.    Current patient status: Observation    Subjective:  I feel better, but just do not know what is going on.      Observation Stay Includes:  51 y.o.male with PMH of  HTN, HLD, CAD /STEMI 03/2011, CHF, seizures, depression, GERD, OSA, BPH, ADD who is placed in obs after evaluation in the ED for chest pain.  The patient states that today is the 4 th day of having intermittent chest pains, using NTG with some relief.  Patient having chest pain that sometimes radiated to neck, but today's ( 09/22/16) episode was accompanied by diaphoresis., and feeling lightheaded.  Patient reports he called Dr. Orvan Falconer office,  and was instructed to call 911.  He received a NTG in the ambulance which helped somewhat.  The chest pain has been on and off all day while in ED. Patient remained pain free while in OBS, no events on telemetry, and serial troponin's negative.  Patient evaluated by Cardiology, and OP Stress test ordered for [facility], cardiology will arrange follow-up.       Chief Complaint   Patient presents with    Chest Pain     Last Nursing documented pain:  0-10 Scale: 3 (09/23/16 5366)  Vitals:  Patient Vitals for the past 24 hrs:   BP Temp Temp src Pulse Resp SpO2   09/23/16 0821 107/66 36.8 C (98.2 F) TEMPORAL 55 18 97 %   09/23/16 0513 112/60 36.1 C (97 F) TEMPORAL 66 18 96 %   09/22/16 2321 116/75 36.7 C (98.1 F) TEMPORAL 57 18 97 %   09/22/16 1831 106/58 36.7 C (98.1 F) TEMPORAL 63 16 95 %   09/22/16 1801 115/78 - - 69 15 98 %   09/22/16 1524 103/54 37 C (98.6 F) Tympanic 59 13 -   09/22/16 1517 103/54 - - 60 16 92 %   09/22/16 1509 102/69 37 C (98.6 F) Tympanic 56 15 99 %   09/22/16 1225 116/82 - - 56 17 99 %   09/22/16 1049 114/83 36 C (96.8 F) - 57 16 98 %   Physical Exam:  Physical Exam   Constitutional: He is oriented to person, place, and time. He appears well-developed and well-nourished. No distress.   Patient states  he feels fine right now, will do OP stress test at [place]:   Head: Normocephalic and atraumatic.   Right Ear: External ear normal.   Left Ear: External ear normal.   Nose: Nose normal.   Mouth/Throat: No oropharyngeal exudate.   Eyes: Conjunctivae and EOM are normal. Pupils are equal, round, and reactive to light. Right eye exhibits no discharge. Left eye exhibits no discharge. No scleral icterus.   Cardiovascular: Normal rate, regular rhythm, normal heart sounds and intact distal pulses.  Exam reveals no gallop and no friction rub.    No murmur heard.  Pulmonary/Chest: Effort normal and breath sounds normal. No stridor. No respiratory distress. He has no wheezes. He has no rales. He exhibits no tenderness.   Abdominal: Soft. Bowel sounds are normal. He exhibits no distension and no mass. There is no tenderness. There is no rebound and no guarding. No hernia.   Musculoskeletal: Normal range of motion. He exhibits no edema or tenderness.   Lymphadenopathy:     He has no cervical adenopathy.   Neurological: He is alert and oriented to person, place, and  time. No cranial nerve deficit. He exhibits normal muscle tone. Coordination normal.   Skin: Skin is warm and dry. No rash noted. He is not diaphoretic. No erythema. No pallor.   Psychiatric: He has a normal mood and affect. His behavior is normal. Judgment and thought content normal.   Nursing note and vitals reviewed.    EKG: normal EKG, normal sinus rhythm, unchanged from previous tracings  Labs:   All labs in the last 24 hours:   Recent Results (from the past 24 hour(s))   CBC and differential    Collection Time: 09/22/16 11:54 AM   Result Value Ref Range    WBC 5.9 4.2 - 9.1 THOU/uL    RBC 4.7 4.6 - 6.1 MIL/uL    Hemoglobin 15.1 13.7 - 17.5 g/dL    Hematocrit 45 40 - 51 %    MCV 95 (H) 79 - 92 fL    MCH 32 26 - 32 pg/cell    MCHC 34 32 - 37 g/dL    RDW 12.7 11.6 - 14.4 %    Platelets 258 150 - 330 THOU/uL    Seg Neut % 58.1 %    Lymphocyte % 31.8 %     Monocyte % 7.9 %    Eosinophil % 1.4 %    Basophil % 0.5 %    Neut # K/uL 3.4 1.8 - 5.4 THOU/uL    Lymph # K/uL 1.9 1.3 - 3.6 THOU/uL    Mono # K/uL 0.5 0.3 - 0.8 THOU/uL    Eos # K/uL 0.1 0.0 - 0.5 THOU/uL    Baso # K/uL 0.0 0.0 - 0.1 THOU/uL    Nucl RBC % 0.0 0.0 - 0.2 /100 WBC    Nucl RBC # K/uL 0.0 0.0 - 0.0 THOU/uL    IMM Granulocytes # 0.0 0.0 - 0.1 THOU/uL    IMM Granulocytes 0.3 %   Plasma profile 7 (Adult ED only)    Collection Time: 09/22/16 11:54 AM   Result Value Ref Range    Chloride,Plasma 102 96 - 108 mmol/L    CO2,Plasma 28 20 - 28 mmol/L    Potassium,Plasma 5.4 (H) 3.4 - 4.7 mmol/L    Sodium,Plasma 140 133 - 145 mmol/L    Anion Gap,PL 10 7 - 16    UN,Plasma 14 6 - 20 mg/dL    Creatinine 0.89 0.67 - 1.17 mg/dL    GFR,Caucasian 98 *    GFR,Black 114 *    Glucose,Plasma 98 60 - 99 mg/dL   Troponin T    Collection Time: 09/22/16 11:54 AM   Result Value Ref Range    Troponin T <0.01 0.00 - 0.02 ng/mL   Hold blue    Collection Time: 09/22/16 11:54 AM   Result Value Ref Range    Hold Blue HOLD TUBE    Hold SST    Collection Time: 09/22/16 11:54 AM   Result Value Ref Range    Hold SST HOLD TUBE    Troponin T    Collection Time: 09/22/16  6:41 PM   Result Value Ref Range    Troponin T <0.01 0.00 - 0.02 ng/mL   Potassium    Collection Time: 09/22/16  6:41 PM   Result Value Ref Range    Potassium 4.0 3.3 - 5.1 mmol/L   Troponin T    Collection Time: 09/23/16 12:12 AM   Result Value Ref Range    Troponin T <0.01 0.00 - 0.02 ng/mL   Basic metabolic  panel    Collection Time: 09/23/16  5:19 AM   Result Value Ref Range    Glucose 96 60 - 99 mg/dL    Sodium 143 133 - 145 mmol/L    Potassium 4.4 3.3 - 5.1 mmol/L    Chloride 103 96 - 108 mmol/L    CO2 27 20 - 28 mmol/L    Anion Gap 13 7 - 16    UN 14 6 - 20 mg/dL    Creatinine 0.93 0.67 - 1.17 mg/dL    GFR,Caucasian 94 *    GFR,Black 109 *    Calcium 9.2 8.6 - 10.2 mg/dL     Imaging findings: Ct Angio Chest    Result Date: 09/22/2016  No evidence of aortic dissection.  Status post stenting of the LAD coronary artery. Right lung base opacity seen on prior chest radiograph from earlier on 10/02/2016 corresponds to atelectasis. END REPORT [facility] submits this DICOM format image data and final report to the [facility], an independent secure electronic health information exchange, on a reciprocally searchable basis (with patient authorization) for a minimum of 12 months after exam date.     *chest Standard Single View    Result Date: 09/22/2016  Interval increase in opacity of the right lung base, nonspecific, possibly due to atelectasis however superimposed aspiration/infectious component is not excluded. END REPORT [facility] submits this DICOM format image data and final report to the [facility], an independent secure electronic health information exchange, on a reciprocally searchable basis (with patient authorization) for a minimum of 12 months after exam date.     Cardiac Testing:  Telemetry: with no events, serial ECG's show no acute changes,  and serial troponin's are negative x 3    Consults: Cardiology    Assessment: Patient hemodynamically stable, symptoms free, and will have OP Exercise Stress Echocardiogram.  Cardiology to make follow-up arrangements.    Plan:   Disposition: Home     Follow-up:  with in 1 week. with PCP    Smoking Cessation: NA    Diagnoses that have been ruled out:   None   Diagnoses that are still under consideration:   None   Final diagnoses:   Chest pain, unspecified type     Author: Pattricia Boss, NP  Note created: 09/23/2016  at: 10:30 AM    Collaborating physician Howell Pringle, MD was immediately available     Gale Journey, Audrea Muscat, NP  09/23/16 1051

## 2016-09-23 NOTE — ED Notes (Signed)
Plan of Care     Tele/ekg/trop; vitals; labs; NPO after midnight; pain control; cardiology consult in am; will continue to monitor patient and treat per orders.

## 2016-09-24 ENCOUNTER — Other Ambulatory Visit: Payer: Self-pay | Admitting: Primary Care

## 2016-09-24 DIAGNOSIS — M545 Low back pain, unspecified: Secondary | ICD-10-CM

## 2016-09-24 DIAGNOSIS — G8929 Other chronic pain: Secondary | ICD-10-CM

## 2016-09-25 LAB — EKG 12-LEAD
P: 28 deg
P: 40 deg
PR: 187 ms
PR: 188 ms
QRS: -10 deg
QRS: -9 deg
QRS: 0 deg
QRSD: 100 ms
QRSD: 104 ms
QRSD: 117 ms
QT: 384 ms
QT: 388 ms
QT: 412 ms
QTc: 382 ms
QTc: 384 ms
QTc: 391 ms
Rate: 54 {beats}/min
Rate: 58 {beats}/min
Rate: 60 {beats}/min
Severity: ABNORMAL
Severity: ABNORMAL
Severity: NORMAL
T: -31 deg
T: -39 deg
T: -53 deg

## 2016-09-26 LAB — EKG 12-LEAD
P: 28 deg
P: 44 deg
PR: 200 ms
PR: 196 ms
QRS: -10 deg
QRS: -16 deg
QRSD: 94 ms
QRSD: 104 ms
QT: 380 ms
QT: 412 ms
QTc: 361 ms
QTc: 387 ms
Rate: 54 {beats}/min
Rate: 53 {beats}/min
Severity: NORMAL
Statement: BORDERLINE
T: -30 deg
T: -45 deg

## 2016-10-01 ENCOUNTER — Ambulatory Visit: Payer: PRIVATE HEALTH INSURANCE | Admitting: Cardiology

## 2016-10-01 ENCOUNTER — Ambulatory Visit
Admission: RE | Admit: 2016-10-01 | Discharge: 2016-10-01 | Disposition: A | Discharge status: Home / Self Care | Payer: PRIVATE HEALTH INSURANCE | Source: Ambulatory Visit | Attending: Cardiology | Admitting: Cardiology

## 2016-10-01 VITALS — BP 108/68 | HR 64 | Ht 71.0 in | Wt 198.4 lb

## 2016-10-01 DIAGNOSIS — E785 Hyperlipidemia, unspecified: Secondary | ICD-10-CM | POA: Insufficient documentation

## 2016-10-01 DIAGNOSIS — R079 Chest pain, unspecified: Secondary | ICD-10-CM | POA: Insufficient documentation

## 2016-10-01 DIAGNOSIS — I2510 Atherosclerotic heart disease of native coronary artery without angina pectoris: Secondary | ICD-10-CM

## 2016-10-01 LAB — EXERCISE STRESS ECHO COMPLETE
Aortic Arch Diameter: 2.7 cm
Aortic Diameter (mid tubular): 3.2 cm
Aortic Diameter (sinus of Valsalva): 3 cm
BMI: 27.7 kg/m2
BP Diastolic: 68 mmHg
BP Systolic: 108 mmHg
BSA: 2.12 m2
Deceleration Time - MV: 233 ms
E/A ratio: 0.95
EF: 60 %
Estimated workload: 11.1 METS
Heart Rate: 64 {beats}/min
Height: 71 in
IVC Diameter: 16.3 mm
LA Diameter BSA Index: 1.9 cm/m2
LA Diameter Height Index: 2.2 cm/m
LA Diameter: 4 cm
LA Systolic Vol BSA Index: 23.9 mL/m2
LA Systolic Vol Height Index: 28.1 mL/m
LA Systolic Volume: 50.7 mL
LV ASE Mass BSA Index: 69.7 gm/m2
LV ASE Mass Height 2.7 Index: 30.1 gm/m2.7
LV ASE Mass Height Index: 81.9 gm/m
LV ASE Mass: 147.8 gm
LV Posterior Wall Thickness: 0.9 cm
LV Septal Thickness: 0.9 cm
LVED Diameter BSA Index: 2.3 cm/m2
LVED Diameter Height Index: 2.7 cm/m
LVED Diameter: 4.8 cm
LVOT Area (calculated): 3.83 cm2
LVOT Cardiac Index: 2.81 L/min/m2
LVOT Cardiac Output: 5.96 L/min
LVOT Diameter: 2.21 cm
LVOT PWD VTI: 24.3 cm
LVOT PWD Velocity (mean): 78 cm/s
LVOT PWD Velocity (peak): 112.7 cm/s
LVOT SV BSA Index: 43.95 mL/m2
LVOT SV Height Index: 51.7 mL/m
LVOT Stroke Rate (mean): 299.1 mL/s
LVOT Stroke Rate (peak): 432.1 mL/s
LVOT Stroke Volume: 93.17 cc
MPHR: 169 {beats}/min
MV Peak A Velocity: 82.2 cm/s
MV Peak E Velocity: 78.3 cm/s
Mitral Annular E/Ea Vel Ratio: 11.58
Mitral Annular Ea Velocity: 6.76 cm/s
O2 sat peak: 98 %
O2 sat rest: 99 %
Peak DBP: 70 mmHg
Peak HR: 136 {beats}/min
Peak SBP: 140 mmHg
Percent MPHR: 80.5 %
RA Volume BSA Index: 10.4 mL/m2
RA Volume Height Index: 12.2 mL/m
RA Volume: 22 mL
RPP: 19040 BPM x mmHG
RR Interval: 937.5 ms
RVED Diameter BSA Index: 1.9 cm/m2
RVED Diameter Height Index: 2.2 cm/m
RVED Diameter: 4 cm
Stress Peak Stage: 3.17
Stress duration (min): 9 min
Stress duration (sec): 31 s
Tricuspid Annular Systolic Plane Excursion (TAPSE): 2.1 cm
Weight (lbs): 198.4 [lb_av]
Weight: 3174.4 oz

## 2016-10-01 MED ORDER — SULFUR HEXAFLUORIDE MICROSPH 60.7-25 MG (LUMASON) IV SUSR *I*
2.0000 mL | INTRAMUSCULAR | Status: AC | PRN
Start: 2016-10-01 — End: 2016-10-01
  Administered 2016-10-01: 2 mL via INTRAVENOUS

## 2016-10-01 MED ORDER — SODIUM CHLORIDE 0.9 % INJ (FLUSH) WRAPPED *I*
10.0000 mL | Status: AC | PRN
Start: 2016-10-01 — End: 2016-10-01
  Administered 2016-10-01: 10 mL via INTRAVENOUS
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
## 2016-10-02 ENCOUNTER — Encounter: Payer: Self-pay | Admitting: Cardiology

## 2016-10-02 NOTE — Progress Notes (Signed)
Patient seen in EDOU by Dr Junius Creamer on 4.10.18  Requesting a fuv with Pedulla within 1-2 weeks and stress echo within 1 week. Appt scheduled 4.18.18  @ 9 for test and follow-unit with Pedulla by Marliss Coots A

## 2016-11-03 ENCOUNTER — Other Ambulatory Visit: Payer: Self-pay | Admitting: Primary Care

## 2016-11-03 DIAGNOSIS — M79602 Pain in left arm: Secondary | ICD-10-CM

## 2016-12-12 ENCOUNTER — Telehealth: Payer: Self-pay | Admitting: Primary Care

## 2016-12-12 DIAGNOSIS — G4733 Obstructive sleep apnea (adult) (pediatric): Secondary | ICD-10-CM

## 2016-12-12 NOTE — Telephone Encounter (Signed)
Please fax a script to [facility] [phone] for Cpap mask, head gear, tubing and supplies

## 2016-12-18 ENCOUNTER — Other Ambulatory Visit: Payer: Self-pay | Admitting: Primary Care

## 2016-12-18 DIAGNOSIS — I2510 Atherosclerotic heart disease of native coronary artery without angina pectoris: Secondary | ICD-10-CM

## 2016-12-18 DIAGNOSIS — G8929 Other chronic pain: Secondary | ICD-10-CM

## 2016-12-18 DIAGNOSIS — M545 Low back pain, unspecified: Secondary | ICD-10-CM

## 2016-12-24 ENCOUNTER — Encounter: Payer: Self-pay | Admitting: Gastroenterology

## 2017-01-22 ENCOUNTER — Other Ambulatory Visit
Admission: RE | Admit: 2017-01-22 | Discharge: 2017-01-22 | Disposition: A | Discharge status: Home / Self Care | Payer: PRIVATE HEALTH INSURANCE | Source: Ambulatory Visit | Attending: Primary Care | Admitting: Primary Care

## 2017-01-22 DIAGNOSIS — I10 Essential (primary) hypertension: Secondary | ICD-10-CM | POA: Insufficient documentation

## 2017-01-22 DIAGNOSIS — E7800 Pure hypercholesterolemia, unspecified: Secondary | ICD-10-CM | POA: Insufficient documentation

## 2017-01-22 LAB — COMPREHENSIVE METABOLIC PANEL
ALT: 27 U/L (ref 0–50)
AST: 28 U/L (ref 0–50)
Albumin: 5 g/dL (ref 3.5–5.2)
Alk Phos: 66 U/L (ref 40–130)
Anion Gap: 11 (ref 7–16)
Bilirubin,Total: 0.3 mg/dL (ref 0.0–1.2)
CO2: 26 mmol/L (ref 20–28)
Calcium: 9.5 mg/dL (ref 8.6–10.2)
Chloride: 104 mmol/L (ref 96–108)
Creatinine: 0.91 mg/dL (ref 0.67–1.17)
GFR,Black: 112 *
GFR,Caucasian: 96 *
Glucose: 112 mg/dL — ABNORMAL HIGH (ref 60–99)
Lab: 20 mg/dL (ref 6–20)
Potassium: 4.9 mmol/L (ref 3.3–5.1)
Sodium: 141 mmol/L (ref 133–145)
Total Protein: 7.1 g/dL (ref 6.3–7.7)

## 2017-01-22 LAB — LIPID PANEL
Chol/HDL Ratio: 3.8
Cholesterol: 121 mg/dL
HDL: 32 mg/dL
LDL Calculated: 64 mg/dL
Non HDL Cholesterol: 89 mg/dL
Triglycerides: 124 mg/dL

## 2017-01-23 ENCOUNTER — Ambulatory Visit: Payer: PRIVATE HEALTH INSURANCE | Attending: Primary Care | Admitting: Primary Care

## 2017-01-23 ENCOUNTER — Encounter: Payer: Self-pay | Admitting: Primary Care

## 2017-01-23 VITALS — BP 118/72 | HR 66 | Ht 71.0 in | Wt 201.0 lb

## 2017-01-23 DIAGNOSIS — R739 Hyperglycemia, unspecified: Secondary | ICD-10-CM

## 2017-01-23 DIAGNOSIS — I2510 Atherosclerotic heart disease of native coronary artery without angina pectoris: Secondary | ICD-10-CM

## 2017-01-23 DIAGNOSIS — I10 Essential (primary) hypertension: Secondary | ICD-10-CM

## 2017-01-23 DIAGNOSIS — E7800 Pure hypercholesterolemia, unspecified: Secondary | ICD-10-CM

## 2017-01-23 DIAGNOSIS — F411 Generalized anxiety disorder: Secondary | ICD-10-CM

## 2017-01-23 NOTE — Progress Notes (Signed)
S:  [name] returns to the office for follow up of his hypertension, hypercholesterolemia, CAD and anxiety disorder.    He continues with his usual medications including metoprolol, lisinopril, amlodipine, atorvastatin, omeprazole, Keppra, gabapentin, cyclobenzaprine, Cymbalta, co-Q10, baby aspirin, Celebrex, Nitrostat, magnesium and fluticasone nasal spray.  He denies any problems with headaches, dizziness, vision changes, chest pain, palpitations, shortness of breath, heartburn, nausea, diarrhea, constipation, bleeding problems or fatigue.  He is doing his best to watch his eating habits and stay active although he does not have a regular exercise routine.  He is not in the habit of checking his blood pressures at home.    His mood remains good with the use of the Cymbalta.  He is sleeping well, his energy level is good, his appetite is good and he is finding enjoyment in his life.  He denies any problems with concentration, coordination, feelings of guilt as well as any suicidal or homicidal ideation.    O:  General: Alert, appropriate, pleasant man in no apparent distress.  Neck: Supple, no lymphadenopathy, no thyromegaly, 2+ carotid pulses bilaterally.  Heart: Regular rate and rhythm, no murmur.  Lungs: Clear to auscultation bilaterally.  Abdomen: Positive bowel sounds, soft, nontender, nondistended, no masses, no organomegaly.  Extremities: 2+ radial and 2+ posterior tibial pulses bilaterally.  No clubbing, cyanosis or edema.  Psych: Neurovegetative signs and symptoms as described above.    Hospital Outpatient Visit on 01/22/2017   Component Date Value Ref Range Status    Cholesterol 01/22/2017 121  mg/dL Final    Comment: REFERENCE RANGE:  < 200 Desirable                  200-239 Borderline High                    > 240 High      Triglycerides 01/22/2017 124  mg/dL Final    Comment: REFERENCE RANGE:  < 150 Normal                  150-199 Borderline High                  200-499 High                     > 500 Very High      HDL 01/22/2017 32  mg/dL Final    Comment: REFERENCE RANGE:  < 40 Low                    > 60 High      LDL Calculated 01/22/2017 64  mg/dL Final    Comment: REFERENCE RANGE:  < 100 Optimal                  100-129 Near or above optimal                  130-159 Borderline High                  160-189 High                    > 189 Very High      Non HDL Cholesterol 01/22/2017 89  mg/dL Final    Target is 86m/dl above(or over) LDL goal    Chol/HDL Ratio 01/22/2017 3.8   Final    Sodium 01/22/2017 141  133 - 145 mmol/L Final    Potassium 01/22/2017 4.9  3.3 - 5.1 mmol/L Final    Chloride 01/22/2017 104  96 - 108 mmol/L Final    CO2 01/22/2017 26  20 - 28 mmol/L Final    Anion Gap 01/22/2017 11  7 - 16 Final    UN 01/22/2017 20  6 - 20 mg/dL Final    Creatinine 01/22/2017 0.91  0.67 - 1.17 mg/dL Final    GFR,Caucasian 01/22/2017 96  * Final    GFR,Black 01/22/2017 112  * Final    *UNITS=mL/min/1.73 square meters    Glucose 01/22/2017 112* 60 - 99 mg/dL Final    Comment: Reference Ranges apply only to FASTING samples.    ADA Guidelines Blood Sugar Levels for Diagnosing Diabetes & Pre-diabetes  Normal: < 100 mg/dL  Impaired Fasting Glucose (IFG): 100-125 mg/dL  Diabetes:  > 126 mg/dL on two different occasions      Calcium 01/22/2017 9.5  8.6 - 10.2 mg/dL Final    Total Protein 01/22/2017 7.1  6.3 - 7.7 g/dL Final    Albumin 01/22/2017 5.0  3.5 - 5.2 g/dL Final    Bilirubin,Total 01/22/2017 0.3  0.0 - 1.2 mg/dL Final    AST 01/22/2017 28  0 - 50 U/L Final    ALT 01/22/2017 27  0 - 50 U/L Final    Alk Phos 01/22/2017 66  40 - 130 U/L Final       A:  1.  Essential hypertension -- well controlled  2.  Hypercholesterolemia -- satisfactory control  3.  CAD -- asymptomatic  4.  Anxiety disorder -- satisfactory management  5.  Hyperglycemia -- new finding    P:  1.  Continue with current medications at their current doses.  2.  Continue to maintain efforts on prudent diet, exercise and  weight management.  3.  RTO in 6 months for follow up of the above problems or sooner if any other problems or concerns.  4.  Complete blood work prior to the next appointment to check an FLP, CMP and Hgb A1c.

## 2017-01-26 ENCOUNTER — Other Ambulatory Visit: Payer: Self-pay | Admitting: Cardiology

## 2017-01-26 ENCOUNTER — Encounter: Payer: Self-pay | Admitting: Neurology

## 2017-01-26 ENCOUNTER — Ambulatory Visit: Payer: PRIVATE HEALTH INSURANCE | Attending: Neurology | Admitting: Neurology

## 2017-01-26 VITALS — BP 112/76 | HR 66 | Wt 198.2 lb

## 2017-01-26 DIAGNOSIS — R404 Transient alteration of awareness: Secondary | ICD-10-CM

## 2017-01-26 MED ORDER — NITROGLYCERIN 0.4 MG SL SUBL *I*
0.4000 mg | SUBLINGUAL_TABLET | SUBLINGUAL | 5 refills | Status: DC | PRN
Start: 2017-01-26 — End: 2018-02-16

## 2017-01-26 NOTE — Progress Notes (Signed)
I saw [name] in the [facility] on 01/26/2017 for follow-up evaluation of a transient episode of altered awareness in 2016.  I last saw him on 07/22/2016.  I have not communicated with him since that visit.    HPI: He tells me that "I guess, you know, maybe" he has had additional episodes.  He has not had any episodes of collapsing and has not had any incontinence.  He has woken up without realizing he had been asleep or that he was tired.  These episodes probably last three to five minutes.  These have happened a couple of times while sitting on the couch.  The other day his head just felt heavy on his neck and he was seeing double.  He has not had an episode of falling asleep that he was not on the couch.  A couple of times he went for a drive and did not remember driving through a particular town.  He had no problems operating the car.  Nobody has been with him when he had the episodes on the couch.  He stopped taking the levtiracetam in the morning and has been taking it only at night a month ago.  His wife got him a puppy to keep him company while she is at work.  He thinks his forgetfulness has increased.  He doesn't remember conversations with his wife.  She tells him he said something that he does not remember saying.  He has had recent memories of growing up and doing things as a kid.  These memories can be very clear.  He experiences "rogue chest pain" here and there.  He avoids using nitroglycerin if he can.  He had a stress test done in April that was normal.  He tries to deep breathe or rest away chest pain.  His understanding is that his doctors are not sure why he has chest pain.  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  He goes out for walks with his puppy.  He had a public showing of his photographs and sold some of them.  He enjoys reading and watching sports programs on television.  He uses cyclobenzaprine nightly to help him to relax and be  able to fall asleep.  If he has back pain he will take it then as well.  He stopped taking magnesium because he read it could cause fatigue and muscle aches.  Those symptoms seemed to improve for a little while, but are now back to what they were when he was taking magnesium.      The active problem list, past medical/surgical history, medications, allergies, social history, and family history were reviewed with the patient and/or caregiver.  I documented/updated these history elements and marked them as reviewed in the appropriate sections of the electronic health record.    REVIEW OF SYSTEMS:  He completed a 15 point review of systems questionnaire that I reviewed today and that will be scanned into the electronic health record.     Vitals:    01/26/17 0919   BP: 112/76   BP Location: Left arm   Patient Position: Sitting   Cuff Size: adult   Pulse: 66   Weight: 89.9 kg (198 lb 3.2 oz)   Body mass index is 27.64 kg/(m^2).     EXAMINATION:  No formal examination was performed today.     IMPRESSION/DISCUSSION:   This is a 50 year old man who has recurrent episodes of transient alteration of  awareness while sitting on the couch.  Based on his history and medical co-morbidities, I think that these are most likely sleep intrusions.  I don't think that these are seizures.  I spent 15 minutes with [name] today, more than 50% of which was spent in counseling and coordination of care.  He is taking levetiracetam only once daily.  He did not discuss making this change with his PCP or with me.  I advised him to stop this completely today and to let me know if any problems develop.  I do not recommend any additional diagnostic evaluation.  He continues to report a number of non-specific symptoms for which I do not think there is a neurological cause.      PLAN:   He will discontinue levetiracetam today.   I will see him back in six months.  I would be happy to see him sooner if needed.      Francee Piccolo,  MD  Assoc. Professor of Clinical Neurology  Pager: [phone]  Office: [phone]  01/26/2017 9:04 AM

## 2017-01-26 NOTE — Patient Instructions (Signed)
·   Stop taking levetiracetam.   Do not make changes in your medications without first discussing them with the medical provider who prescribes the medication.

## 2017-03-01 ENCOUNTER — Other Ambulatory Visit: Payer: Self-pay | Admitting: Primary Care

## 2017-03-01 DIAGNOSIS — M545 Low back pain: Secondary | ICD-10-CM

## 2017-03-01 DIAGNOSIS — G8929 Other chronic pain: Secondary | ICD-10-CM

## 2017-03-02 MED ORDER — CELECOXIB 200 MG PO CAPS *I*
200.0000 mg | ORAL_CAPSULE | Freq: Two times a day (BID) | ORAL | 1 refills | Status: DC
Start: 2017-03-02 — End: 2017-03-30

## 2017-03-02 MED ORDER — ATORVASTATIN CALCIUM 40 MG PO TABS *I*
40.0000 mg | ORAL_TABLET | Freq: Every day | ORAL | 1 refills | Status: DC
Start: 2017-03-02 — End: 2018-08-02

## 2017-03-02 MED ORDER — OMEPRAZOLE 20 MG PO CPDR *I*
20.0000 mg | DELAYED_RELEASE_CAPSULE | Freq: Two times a day (BID) | ORAL | 1 refills | Status: DC
Start: 2017-03-02 — End: 2017-03-30

## 2017-03-06 ENCOUNTER — Encounter: Payer: Self-pay | Admitting: Cardiology

## 2017-03-06 ENCOUNTER — Ambulatory Visit: Payer: PRIVATE HEALTH INSURANCE | Attending: Primary Care | Admitting: Cardiology

## 2017-03-06 VITALS — BP 94/68 | HR 60 | Ht 71.0 in | Wt 199.0 lb

## 2017-03-06 DIAGNOSIS — E7800 Pure hypercholesterolemia, unspecified: Secondary | ICD-10-CM

## 2017-03-06 DIAGNOSIS — I10 Essential (primary) hypertension: Secondary | ICD-10-CM

## 2017-03-06 DIAGNOSIS — I2510 Atherosclerotic heart disease of native coronary artery without angina pectoris: Secondary | ICD-10-CM
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
## 2017-03-25 ENCOUNTER — Emergency Department
Admission: EM | Admit: 2017-03-25 | Discharge: 2017-03-25 | Disposition: A | Discharge status: Home / Self Care | Payer: PRIVATE HEALTH INSURANCE | Source: Ambulatory Visit | Attending: Emergency Medicine | Admitting: Emergency Medicine

## 2017-03-25 ENCOUNTER — Encounter: Payer: Self-pay | Admitting: Emergency Medicine

## 2017-03-25 ENCOUNTER — Emergency Department: Payer: PRIVATE HEALTH INSURANCE

## 2017-03-25 ENCOUNTER — Other Ambulatory Visit: Payer: Self-pay | Admitting: Cardiology

## 2017-03-25 ENCOUNTER — Telehealth: Payer: Self-pay

## 2017-03-25 DIAGNOSIS — I499 Cardiac arrhythmia, unspecified: Secondary | ICD-10-CM

## 2017-03-25 DIAGNOSIS — R569 Unspecified convulsions: Secondary | ICD-10-CM

## 2017-03-25 DIAGNOSIS — R0789 Other chest pain: Secondary | ICD-10-CM | POA: Insufficient documentation

## 2017-03-25 DIAGNOSIS — Z9861 Coronary angioplasty status: Secondary | ICD-10-CM | POA: Insufficient documentation

## 2017-03-25 DIAGNOSIS — R42 Dizziness and giddiness: Secondary | ICD-10-CM

## 2017-03-25 DIAGNOSIS — Z87891 Personal history of nicotine dependence: Secondary | ICD-10-CM | POA: Insufficient documentation

## 2017-03-25 DIAGNOSIS — R5383 Other fatigue: Secondary | ICD-10-CM

## 2017-03-25 DIAGNOSIS — R0602 Shortness of breath: Secondary | ICD-10-CM

## 2017-03-25 DIAGNOSIS — R079 Chest pain, unspecified: Secondary | ICD-10-CM

## 2017-03-25 LAB — CBC AND DIFFERENTIAL
Baso # K/uL: 0 10*3/uL (ref 0.0–0.1)
Basophil %: 0.3 %
Eos # K/uL: 0.1 10*3/uL (ref 0.0–0.5)
Eosinophil %: 0.6 %
Hematocrit: 46 % (ref 40–51)
Hemoglobin: 15.6 g/dL (ref 13.7–17.5)
IMM Granulocytes #: 0.1 10*3/uL (ref 0.0–0.1)
IMM Granulocytes: 1 %
Lymph # K/uL: 2.1 10*3/uL (ref 1.3–3.6)
Lymphocyte %: 27.1 %
MCH: 32 pg (ref 26–32)
MCHC: 34 g/dL (ref 32–37)
MCV: 94 fL — ABNORMAL HIGH (ref 79–92)
Mono # K/uL: 0.6 10*3/uL (ref 0.3–0.8)
Monocyte %: 7.7 %
Neut # K/uL: 5 10*3/uL (ref 1.8–5.4)
Nucl RBC # K/uL: 0 10*3/uL (ref 0.0–0.0)
Nucl RBC %: 0 /100 WBC (ref 0.0–0.2)
Platelets: 297 10*3/uL (ref 150–330)
RBC: 4.9 MIL/uL (ref 4.6–6.1)
RDW: 12.9 % (ref 11.6–14.4)
Seg Neut %: 63.3 %
WBC: 7.9 10*3/uL (ref 4.2–9.1)

## 2017-03-25 LAB — BASIC METABOLIC PANEL
Anion Gap: 12 (ref 7–16)
CO2: 26 mmol/L (ref 20–28)
Calcium: 10.1 mg/dL (ref 8.6–10.2)
Chloride: 104 mmol/L (ref 96–108)
Creatinine: 0.9 mg/dL (ref 0.67–1.17)
GFR,Black: 113 *
GFR,Caucasian: 98 *
Glucose: 110 mg/dL — ABNORMAL HIGH (ref 60–99)
Lab: 15 mg/dL (ref 6–20)
Potassium: 4.8 mmol/L (ref 3.3–5.1)
Sodium: 142 mmol/L (ref 133–145)

## 2017-03-25 LAB — TROPONIN T 0 HR HIGH SENSITIVITY (IP/ED ONLY): TROP T 0 HR High Sensitivity: 8 ng/L (ref 0–22)

## 2017-03-25 LAB — TROPONIN T 3 HR W/ DELTA HIGH SENSITIVITY (IP/ED ONLY)
HS TROP % Change: -13 % — ABNORMAL LOW (ref 0–20)
TROP T 0-3 HR DELTA High Sensitivity: -1 — ABNORMAL LOW (ref 0–11)
TROP T 3 HR High Sensitivity: 7 ng/L (ref 0–22)

## 2017-03-25 MED ORDER — ASPIRIN 81 MG PO CHEW *I*
324.0000 mg | CHEWABLE_TABLET | Freq: Once | ORAL | Status: AC
Start: 2017-03-25 — End: 2017-03-25
  Administered 2017-03-25: 324 mg via ORAL
  Filled 2017-03-25: qty 4

## 2017-03-25 MED ORDER — NITROGLYCERIN 0.4 MG SL SUBL *I*
0.4000 mg | SUBLINGUAL_TABLET | Freq: Once | SUBLINGUAL | Status: AC
Start: 2017-03-25 — End: 2017-03-25
  Administered 2017-03-25: 0.4 mg via SUBLINGUAL
  Filled 2017-03-25: qty 1

## 2017-03-25 MED ORDER — MORPHINE SULFATE 4 MG/ML IV SOLN *WRAPPED*
4.0000 mg | Freq: Once | INTRAVENOUS | Status: AC
Start: 2017-03-25 — End: 2017-03-25
  Administered 2017-03-25: 4 mg via INTRAVENOUS
  Filled 2017-03-25: qty 1

## 2017-03-25 NOTE — ED Notes (Signed)
Pt ambulated to bathroom with assistance of wife. Pt has a steady gait.

## 2017-03-25 NOTE — ED Notes (Signed)
While in waiting room, pt wife began yelling "I need help, my husband is having a seizure."  Triage RN, PCT and security immediately over to pt.  Pt was shaking but was responsive to stimuli and voice.  Did seem a little "dazed" but was appropriate.  Brought directly back to bed 12.  Dr. Kathrin Greathouse notified.

## 2017-03-25 NOTE — Telephone Encounter (Signed)
Patient states that after mowing his lawn yesterday he started to feel shaky, with some chest discomfort..he took 2 Nitroglycerin tablets this morning, but now he is sweating,and has SOB

## 2017-03-25 NOTE — ED Notes (Signed)
03/25/17 1142   Expected Call-In Information   ED Service [facility] Adult Call-in   PCP/Service Referral [facility]/Cardiology   Pt Info note/Reason for sending CP   Call reported to [facility]

## 2017-03-25 NOTE — ED Notes (Signed)
ED RN INTERN ATTESTATION       I Kyle Brooms Anthon Harpole, RN (RN) reviewed the following charting information by the RN intern: [name]    Nursing Assessments  Medications  Plan of Care  Teaching   Notes    In the chart of [name] (51 y.o. male) and attest to the charting being accurate.
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
## 2017-03-25 NOTE — Discharge Instructions (Signed)
Your initial troponin in th ED was 8.  Your repeat troponin was 7.  This is re-assuring, and helps [place] rule out a heart attack.     Continue your medications as prescribed.    Rest.    You should follow up with your primary care provider in 2-3 days.     Follow up with your neurologist as soon as possible.     Return to ED for worsening symptoms, severe pain, difficulty breathing, loss of consciousness, or any other concerns.

## 2017-03-25 NOTE — Telephone Encounter (Signed)
Spoke to patient.  He notes that while mowing his lawn last night he had significant diaphoresis and was "not feeling well" which he attributed to the heat.  He had brief palpitations which occur occasionally for him.  At bedtime he had mild precordial chest discomfort but was able to sleep.  Upon waking this am noted progressive chest discomfort which was not alleviated by SL NTG.  Notes SOB and associated diaphoresis.    Despite negative recent workups of CP, given h/o STEMI and known CAD, recommend ED eval with MI r/o.  He prefers to have his wife drive him to [facility].    Lachanda Buczek E [place], PA

## 2017-03-25 NOTE — ED Notes (Signed)
Pt presents to the ED with c/o chest pain since yesterday. Pt states he was mowing the lawn yesterday around 6 pm when the chest pain started. Pt has a hx of heart attack. Pt's wife states that pt was experiencing seizure like activity in the waiting room and that they are in the midst of having pt looked at by neurologist. Pt does not have a definitive hx of seizure like activity due to no one witnessing them but states there are been two circumstances that mimic what seizure like activity it.

## 2017-03-25 NOTE — ED Notes (Signed)
Pt verbalized understanding of discharge paperwork. Pt's IV removed and intact. Pt ambulated to waiting room with wife.

## 2017-03-26 ENCOUNTER — Telehealth: Payer: Self-pay

## 2017-03-26 LAB — EKG 12-LEAD
P: 53 deg
PR: 168 ms
QRS: -7 deg
QRSD: 92 ms
QT: 376 ms
QTc: 400 ms
Rate: 68 {beats}/min
Severity: NORMAL
T: -27 deg

## 2017-03-26 NOTE — Telephone Encounter (Signed)
No immediate need for cardiology follow up.  Negative workup of chest pain in the ED yesterday.  Possibly MSK.  He is concerned about possible seizure yesterday and recommend he f/u with neurology.    Plan for cardiology f/u as previously scheduled.    Llesenia Fogal E Dia Jeffer[place], PA

## 2017-03-26 NOTE — Telephone Encounter (Signed)
Patient calling to f/u on ED visit plan of discharge - should he schedule to come in to Cardiology?  Patient is reporting current chest pain this morning and headache - not as bad as yesterday but it started after he woke this morning.  He says if it gets worse he will take nitro.

## 2017-03-30 ENCOUNTER — Ambulatory Visit: Payer: PRIVATE HEALTH INSURANCE | Attending: Neurology | Admitting: Neurology

## 2017-03-30 ENCOUNTER — Encounter: Payer: Self-pay | Admitting: Neurology

## 2017-03-30 ENCOUNTER — Ambulatory Visit: Payer: PRIVATE HEALTH INSURANCE | Admitting: Primary Care

## 2017-03-30 VITALS — BP 118/80 | HR 66 | Wt 202.1 lb

## 2017-03-30 DIAGNOSIS — R2990 Unspecified symptoms and signs involving the nervous system: Secondary | ICD-10-CM

## 2017-03-30 NOTE — Progress Notes (Signed)
I saw [name] in the [facility] clinic on 03/30/2017 for follow-up evaluation of an episode of transient neurologic symptoms while he was at the [facility] Emergency Department on 03/25/2017.  His wife accompanied him today.  I last saw him on 01/26/2017.  I reviewed the records of his ED evaluation.    HPI: He tells me that he mowed his lawn last Tuesday evening.  He got very hot and felt very tired.  A couple of times he had to take a break which is not normal.  He finished and was watching television  He had a couple of episodes of strong palpitations, one that took his breath away and made him cough.  When they went to bed he started having chest pains that were not severe.  He did not think they were heart related, it just didn't feel like a heart problem at that point.  The next morning the chest pains were there when he woke up.  He took a couple of nitroglycerin because he started getting "really tachy and sweaty" and he was lightheaded and sweaty after he was up for an hour and a half or two hours.  His chest felt real uncomfortable.  He called his wife and said he did not want to go to the hospital but might need to see his cardiologist.  The cardiology office advised him to go to the ED.  They did not recommend calling an ambulance if he had a ride.  His wife came home from work and got him.      His wife tells me when she got home he did not seem super sweaty or anything at that time.  He was concerned because of not getting relief with the nitroglycerin.  Any time he has to go to the hospital he gets anxious.  He does a lot of apologizing and pacing.  He was hurrying her to get ready and was impatient.  They went to the emergency department.  He was expecting his cardiology office would call head to get him set up.  They were told they needed to wait when they arrived.  He was called in and had an EKG and was sent back to the waiting room.  He was sitting with his  wife.  He felt anxious and frustrated.  He did not want to be there in the first place.  He wanted to find out it was not his heart and go home.  He had not had any blood work done.  After 30 or 45 minutes his next memory is being in the exam room.      His wife tells me that he leaned his head back against the wall.  His right arm jumped.  When she looked at him his head was rolling back and forth and his eyes were blinking.  Then his body started twitching irregularly.  He did not respond to her when she called his name. She called for help.  The nurse walked over and called his name, and he responded immediately.  He was still twitching at that point, but it was slowing down.  They got him standing up and took him back to the exam room.  It took an hour for him to clear up and be himself.  When the nurse asked where the chest pain is he said "In my chest."  His responses to the providers were snarky.  When he did finger-to-nose the movements were not smooth,  they were stuttered with pauses.  He slept on and off for about an hour.   The first couple of days after he was in the hospital he felt like a slug.  He felt more depressed and anxious.  His wife told him that he could not drive.  He could do whatever he want when he wanted previously.  It has been a disappointing few days for him.      Prior to the symptoms after mowing the lawn recently he was feeling pretty good.  He gets occasional pains between his shoulder blades and into his neck.  He gets leg and back pains.  These are all his standard, every day discomforts.  He doesn't know if these are his fibromyalgia or nerve pain or his back issues.  He never feels like Superman any more like he did when he was young.  His depression and anxiety have been good.  He has not been either until he started to feel poorly.  His puppy keeps him busy.      The active problem list, past medical/surgical history, medications, allergies, social history, and family history  were reviewed with the patient and/or caregiver.  I documented/updated these history elements and marked them as reviewed in the appropriate sections of the electronic health record.    REVIEW OF SYSTEMS:  He completed a 15 point review of systems questionnaire that I reviewed today and that will be scanned into the electronic health record.     Vitals:    03/30/17 0927   BP: 118/80   BP Location: Left arm   Patient Position: Sitting   Cuff Size: adult   Pulse: 66   Weight: 91.7 kg (202 lb 1.6 oz)   Body mass index is 27.41 kg/(m^2).     EXAMINATION:  No formal examination was performed today.     IMPRESSION/DISCUSSION:   This is a 51 year old man who experienced an episode of transient neurologic symptoms while in the ED last week for evaluation of chest pain, diaphoresis, lightheadedness, and racing heart.  I spent 25 minutes with [name] and his wife today, more than 50% of which was spent in counseling and coordination of care.  I explained that the description of the event that his wife provides is not consistent with an electrical seizure.  I think that this event was likely psychological in etiology.  It occurred in the context of high stress and anxiety related to the uncertain nature of his symptoms and his dissatisfaction with his experience to that point in the ED.  We discussed his ambivalence about his chest pain and other symptoms in the context of his awareness of his underlying coronary artery disease and history of myocardial infarction.  I do not recommend pursuing additional neurological testing at this time, and I do not recommend starting a new anti-seizure medication.  I don't know if this episode is similar in anyway to unwitnessed episodes he has had in the past.     PLAN:   We are not pursuing additional diagnostic testing and I have not prescribed any changes in management.   I will see him back in February as initially planned.  I would be happy to see him again sooner if needed.       Francee Piccolo, MD  Assoc. Professor of Clinical Neurology  Pager: [phone]  Office: [phone]  03/30/2017 9:08 AM

## 2017-03-31 ENCOUNTER — Encounter: Payer: Self-pay | Admitting: Primary Care

## 2017-03-31 ENCOUNTER — Ambulatory Visit: Payer: PRIVATE HEALTH INSURANCE | Attending: Primary Care | Admitting: Primary Care

## 2017-03-31 VITALS — BP 114/82 | Ht 72.0 in | Wt 202.0 lb

## 2017-03-31 DIAGNOSIS — F411 Generalized anxiety disorder: Secondary | ICD-10-CM

## 2017-03-31 DIAGNOSIS — R079 Chest pain, unspecified: Secondary | ICD-10-CM

## 2017-03-31 DIAGNOSIS — R569 Unspecified convulsions: Secondary | ICD-10-CM

## 2017-03-31 MED ORDER — DULOXETINE HCL 30 MG PO CPEP *I*
30.0000 mg | DELAYED_RELEASE_CAPSULE | Freq: Every day | ORAL | 2 refills | Status: DC
Start: 2017-03-31 — End: 2017-07-02

## 2017-04-05 NOTE — Progress Notes (Signed)
S:  Mr. Sapp presents to the office with his wife for follow up of a recent hospitalization.    He went to the [facility] ED on 03/25/17 with complaints of chest pain.  He underwent a cardiac work up and ruled out for myocardial infarction.  While he was in the waiting room at the hospital he had a seizure like episode.  His wife reports that he jerked suddenly, rolled his head back and exhibited global tremors.  He was unresponsive.  She notified the triage nurse who came to his assistance.  He then became responsive.  He felt tired thereafter.  He has had no further episodes of this nature since that time.    He continues with his usual medications including metoprolol, lisinopril, amlodipine, atorvastatin, omeprazole, gabapentin, cyclobenzaprine, Cymbalta, co-Q10, baby aspirin, Celebrex and Nitrostat.  He denies any problems with headaches, dizziness, vision changes, palpitations, shortness of breath, heartburn, nausea, diarrhea, constipation, bleeding problems or fatigue.  He is not in the habit of checking his blood pressures at home.    He and his wife are wondering if the Cymbalta may put him closer to a seizure threshold.    O:  General: Alert, appropriate, pleasant man in no apparent distress.  Neck: Supple, no lymphadenopathy, no thyromegaly, 2+ carotid pulses bilaterally.  Heart: Regular rate and rhythm, no murmur.  Lungs: Clear to auscultation bilaterally.  Abdomen: Positive bowel sounds, soft, nontender, nondistended, no masses, no organomegaly.  Extremities: 2+ radial and 2+ posterior tibial pulses bilaterally.  No clubbing, cyanosis or edema.  Neuro: CN II-XII intact.  No sensory or motor deficits.  Brisk, uniform upper and lower extremity reflexes bilaterally.  5/5 muscle strength in the flexors and extensors of the shoulders, elbows, hips and knees bilaterally.    A:  1.  Chest pain -- possibly a product of angina  2.  Convulsions -- consistent with a sympathetic response  3.  Anxiety  disorder -- satisfactory management    P:  1.  Discontinue Celebrex since it could be aggravating the angina.  2.  Reduce the Cymbalta to 30 mg once daily since it could be making the patient more susceptible to the episodic convulsions.  3.  Continue with other medications as previously prescribed.  4.  RTO in 2 weeks for follow up of the above problems or sooner if any other problems or concerns.

## 2017-04-09 ENCOUNTER — Other Ambulatory Visit: Payer: Self-pay | Admitting: Primary Care

## 2017-04-14 ENCOUNTER — Ambulatory Visit: Payer: PRIVATE HEALTH INSURANCE | Attending: Primary Care | Admitting: Primary Care

## 2017-04-14 ENCOUNTER — Telehealth: Payer: Self-pay

## 2017-04-14 ENCOUNTER — Encounter: Payer: Self-pay | Admitting: Primary Care

## 2017-04-14 VITALS — BP 122/74 | HR 72 | Ht 72.0 in | Wt 202.6 lb

## 2017-04-14 DIAGNOSIS — F419 Anxiety disorder, unspecified: Secondary | ICD-10-CM

## 2017-04-14 DIAGNOSIS — R569 Unspecified convulsions: Secondary | ICD-10-CM

## 2017-04-14 DIAGNOSIS — R079 Chest pain, unspecified: Secondary | ICD-10-CM

## 2017-04-14 DIAGNOSIS — M25522 Pain in left elbow: Secondary | ICD-10-CM

## 2017-04-14 DIAGNOSIS — Z23 Encounter for immunization: Secondary | ICD-10-CM

## 2017-04-14 DIAGNOSIS — Z1211 Encounter for screening for malignant neoplasm of colon: Secondary | ICD-10-CM

## 2017-04-14 DIAGNOSIS — M7712 Lateral epicondylitis, left elbow: Secondary | ICD-10-CM

## 2017-04-14 MED ORDER — ESCITALOPRAM OXALATE 10 MG PO TABS *I*
10.0000 mg | ORAL_TABLET | Freq: Every day | ORAL | 5 refills | Status: DC
Start: 2017-04-14 — End: 2017-07-27

## 2017-04-14 NOTE — Telephone Encounter (Signed)
Medical Supply Representative calling to request amended Rx for patients Elbow brace.  Insurance will not accept diagnosis as "Elbow Pain" stating that this is a Symptom and not a Diagnosis.  Please create a new script for faxing with diagnosis.  Thanks.

## 2017-04-14 NOTE — Progress Notes (Signed)
S:  [name] returns to the office with his wife for follow up of his recent chest pain, convulsions, anxiety disorder and chronic left elbow pain.    He reports that he has had no further episodes of chest pain since his last appointment.  He has had no symptoms to suggest a seizure or convulsions.  He did discontinue the Celebrex and reduced his dose of the Cymbalta as we had discussed.  He has noticed that his anxiety symptoms have increased since reducing the dose of the Cymbalta.  He would like to consider further options for management of his anxiety.    He otherwise continues with his usual medications including metoprolol, lisinopril, amlodipine, atorvastatin, omeprazole, gabapentin, cyclobenzaprine, co-Q10, baby aspirin and Nitrostat.  He denies any problems with headaches, dizziness, vision changes, palpitations, shortness of breath, heartburn, nausea, diarrhea, constipation, bleeding problems or fatigue.  He has had loose stools during the past 2 weeks.  He is not in the habit of checking his blood pressures at home.    O:  General: Alert, appropriate, pleasant man in no apparent distress.  Neck: Supple, no lymphadenopathy, no thyromegaly, 2+ carotid pulses bilaterally.  Heart: Regular rate and rhythm, no murmur.  Lungs: Clear to auscultation bilaterally.  Abdomen: Positive bowel sounds, soft, nontender, nondistended, no masses, no organomegaly.  Extremities: 2+ radial and 2+ posterior tibial pulses bilaterally.  No clubbing, cyanosis or edema.  MSK: Patient has a moderate degree of tenderness superficial to and surrounding the left lateral epicondyle.    A:  1.  Chest pain -- no recurrent symptoms.  2.  Convulsions -- no recurrent symptoms  3.  Anxiety disorder -- not adequately managed  4.  Chronic left elbow pain    P:  1.  Initiate Lexapro 10 mg once daily.  2.  Continue with other medications as previously prescribed.  3.  Provided a prescription order for an adjustable, hinged, left elbow brace  for management of the chronic left elbow pain.  4.  Patient was given a seasonal influenza immunization in the office today.  5.  Refer to gastroenterologist, Dr. Sabino Donovan for a screening colonoscopy.  6.  RTO in 4 months as already scheduled or sooner if any other problems or concerns.  7.  Complete blood work prior to the next appointment as previously discussed.

## 2017-04-15 NOTE — Addendum Note (Signed)
Addended by: [name] on: 04/15/2017 05:13 PM     Modules accepted: Orders

## 2017-04-15 NOTE — Telephone Encounter (Signed)
I have changed the order and reprinted it.  Please forward it accordingly.

## 2017-05-18 ENCOUNTER — Encounter: Payer: Self-pay | Admitting: Primary Care

## 2017-05-28 ENCOUNTER — Other Ambulatory Visit: Payer: Self-pay | Admitting: Primary Care

## 2017-05-28 DIAGNOSIS — M79602 Pain in left arm: Secondary | ICD-10-CM

## 2017-07-02 ENCOUNTER — Other Ambulatory Visit: Payer: Self-pay | Admitting: Primary Care

## 2017-07-02 DIAGNOSIS — F411 Generalized anxiety disorder: Secondary | ICD-10-CM

## 2017-07-06 ENCOUNTER — Encounter: Payer: Self-pay | Admitting: Primary Care

## 2017-07-16 ENCOUNTER — Encounter: Payer: Self-pay | Admitting: Gastroenterology

## 2017-07-22 ENCOUNTER — Ambulatory Visit: Payer: PRIVATE HEALTH INSURANCE | Admitting: Primary Care

## 2017-07-27 ENCOUNTER — Encounter: Payer: Self-pay | Admitting: Primary Care

## 2017-07-27 ENCOUNTER — Ambulatory Visit: Payer: PRIVATE HEALTH INSURANCE | Attending: Primary Care | Admitting: Primary Care

## 2017-07-27 VITALS — BP 118/74 | HR 68 | Ht 72.0 in | Wt 211.4 lb

## 2017-07-27 DIAGNOSIS — F419 Anxiety disorder, unspecified: Secondary | ICD-10-CM

## 2017-07-27 DIAGNOSIS — M549 Dorsalgia, unspecified: Secondary | ICD-10-CM

## 2017-07-27 DIAGNOSIS — E7800 Pure hypercholesterolemia, unspecified: Secondary | ICD-10-CM

## 2017-07-27 DIAGNOSIS — I10 Essential (primary) hypertension: Secondary | ICD-10-CM

## 2017-07-27 LAB — PCMH DEPRESSION ASSESSMENT

## 2017-07-27 MED ORDER — ESCITALOPRAM OXALATE 10 MG PO TABS *I*
10.0000 mg | ORAL_TABLET | Freq: Every day | ORAL | 3 refills | Status: AC
Start: 2017-07-27 — End: 2018-02-01

## 2017-07-28 ENCOUNTER — Other Ambulatory Visit: Payer: Self-pay | Admitting: Gastroenterology

## 2017-07-28 ENCOUNTER — Other Ambulatory Visit
Admission: RE | Admit: 2017-07-28 | Discharge: 2017-07-28 | Disposition: A | Discharge status: Home / Self Care | Payer: PRIVATE HEALTH INSURANCE | Source: Ambulatory Visit | Attending: Primary Care | Admitting: Primary Care

## 2017-07-28 ENCOUNTER — Encounter: Payer: PRIVATE HEALTH INSURANCE | Admitting: Radiology

## 2017-07-28 DIAGNOSIS — R739 Hyperglycemia, unspecified: Secondary | ICD-10-CM

## 2017-07-28 DIAGNOSIS — E7800 Pure hypercholesterolemia, unspecified: Secondary | ICD-10-CM

## 2017-07-28 DIAGNOSIS — M549 Dorsalgia, unspecified: Secondary | ICD-10-CM

## 2017-07-28 DIAGNOSIS — M8588 Other specified disorders of bone density and structure, other site: Secondary | ICD-10-CM

## 2017-07-28 DIAGNOSIS — I10 Essential (primary) hypertension: Secondary | ICD-10-CM | POA: Insufficient documentation

## 2017-07-28 LAB — COMPREHENSIVE METABOLIC PANEL
ALT: 34 U/L (ref 0–50)
AST: 26 U/L (ref 0–50)
Albumin: 4.8 g/dL (ref 3.5–5.2)
Alk Phos: 84 U/L (ref 40–130)
Anion Gap: 13 (ref 7–16)
Bilirubin,Total: 0.2 mg/dL (ref 0.0–1.2)
CO2: 27 mmol/L (ref 20–28)
Calcium: 9.3 mg/dL (ref 8.6–10.2)
Chloride: 104 mmol/L (ref 96–108)
Creatinine: 0.88 mg/dL (ref 0.67–1.17)
GFR,Black: 114 *
GFR,Caucasian: 98 *
Glucose: 119 mg/dL — ABNORMAL HIGH (ref 60–99)
Lab: 18 mg/dL (ref 6–20)
Potassium: 4.5 mmol/L (ref 3.3–5.1)
Sodium: 144 mmol/L (ref 133–145)
Total Protein: 7 g/dL (ref 6.3–7.7)

## 2017-07-28 LAB — LIPID PANEL
Chol/HDL Ratio: 4.4
Cholesterol: 142 mg/dL
HDL: 32 mg/dL
LDL Calculated: 69 mg/dL
Non HDL Cholesterol: 110 mg/dL
Triglycerides: 205 mg/dL — AB

## 2017-07-28 LAB — HEMOGLOBIN A1C: Hemoglobin A1C: 6 % — ABNORMAL HIGH

## 2017-07-30 NOTE — Progress Notes (Signed)
S:  [name] returns to the office with his wife for follow up of his hypertension, hypercholesterolemia and anxiety disorder as well as with complaints of upper back pain for the past year.    He did initiate the Lexapro as we had discussed at his last appointment.  He is pleased to report that his anxiety symptoms have diminished with the use of this medication.  He is satisfied with the results and wishes to continue the same.    He otherwise continues with his usual medications including metoprolol, lisinopril, amlodipine, atorvastatin, Cymbalta, omeprazole, gabapentin, cyclobenzaprine, co-Q10, baby aspirin and Nitrostat.  He denies any problems with headaches, dizziness, vision changes, chest pain, palpitations, shortness of breath, heartburn, nausea, diarrhea, constipation, bleeding problems or fatigue.  He is doing his best to watch his eating habits and stay active although he does not have a regular exercise routine.  He is not in the habit of checking his blood pressures at home.    He reports continuous aching discomfort between his shoulder blades during the past year.  The symptoms wax and wane in severity.  The pain can be worse with certain changes in position and improves with rest.  He denies any fall, strain or injury that could have triggered his symptoms.  He wonders if there is any cause for concern.    He did not complete his blood work prior to today's appointment.    O:  General: Alert, appropriate, pleasant man in no apparent distress.  Neck: Supple, no lymphadenopathy, no thyromegaly, 2+ carotid pulses bilaterally.  Heart: Regular rate and rhythm, no murmur.  Lungs: Clear to auscultation bilaterally.  Abdomen: Positive bowel sounds, soft, nontender, nondistended, no masses, no organomegaly.  Extremities: 2+ radial and 2+ posterior tibial pulses bilaterally.  No clubbing, cyanosis or edema.  MSK: Patient has a mild tenderness over the spinous processes and perispinous muscles of the  upper thoracic spine, at the levels of T4 and T5.  No crepitance or instability.  Anterior flexion of the back to 80 from vertical and extension to 15.  Lateral flexion to 20 and shoulder rotation to 80 bilaterally.    A:  1.  Essential hypertension -- well controlled  2.  Hypercholesterolemia -- due for follow up  3.  Anxiety disorder -- satisfactory management  4.  Upper back pain -- likely osteoarthritis    P:  1.  Continue with current medications at their current doses.  2.  Continue to maintain efforts on prudent diet, exercise and weight management.  3.  Complete the blood work that was previously requested, in the near future.  4.  Refer for x-rays of the thoracic spine to evaluate for evidence of osteoarthritis or other bony pathology.  5.  RTO in 1 week for follow up of x-rays and blood work or sooner if any other problems or concerns.

## 2017-08-03 ENCOUNTER — Encounter: Payer: Self-pay | Admitting: Primary Care

## 2017-08-03 ENCOUNTER — Ambulatory Visit: Payer: PRIVATE HEALTH INSURANCE | Attending: Primary Care | Admitting: Primary Care

## 2017-08-03 VITALS — BP 118/68 | HR 72 | Ht 72.0 in | Wt 211.0 lb

## 2017-08-03 DIAGNOSIS — R739 Hyperglycemia, unspecified: Secondary | ICD-10-CM

## 2017-08-03 DIAGNOSIS — I2510 Atherosclerotic heart disease of native coronary artery without angina pectoris: Secondary | ICD-10-CM

## 2017-08-03 DIAGNOSIS — Z125 Encounter for screening for malignant neoplasm of prostate: Secondary | ICD-10-CM

## 2017-08-03 DIAGNOSIS — I10 Essential (primary) hypertension: Secondary | ICD-10-CM

## 2017-08-03 DIAGNOSIS — E7800 Pure hypercholesterolemia, unspecified: Secondary | ICD-10-CM

## 2017-08-04 NOTE — Progress Notes (Signed)
S:  [name] returns to the office for follow up of his hypertension, hypercholesterolemia, CAD and hyperglycemia.    He continues with his usual medications including metoprolol, lisinopril, amlodipine, atorvastatin, Cymbalta, Lexapro, omeprazole, gabapentin, cyclobenzaprine, co-Q10, baby aspirin and Nitrostat.  He denies any problems with headaches, dizziness, vision changes, chest pain, palpitations, shortness of breath, heartburn, nausea, diarrhea, constipation, bleeding problems or fatigue.  He is doing his best to watch his eating habits and stay active although he does not have a regular exercise routine.  He is not in the habit of checking his blood pressures at home.    He reports that the pain between his shoulder blades has diminished since his last appointment.  The x-rays of his thoracic spine identified mild degenerative changes.    O:  General: Alert, appropriate, pleasant man in no apparent distress.  Neck: Supple, no lymphadenopathy, no thyromegaly, 2+ carotid pulses bilaterally.  Heart: Regular rate and rhythm, no murmur.  Lungs: Clear to auscultation bilaterally.  Abdomen: Positive bowel sounds, soft, nontender, nondistended, no masses, no organomegaly.  Extremities: 2+ radial and 2+ posterior tibial pulses bilaterally.  No clubbing, cyanosis or edema.    Hospital Outpatient Visit on 07/28/2017   Component Date Value Ref Range Status    Cholesterol 07/28/2017 142  mg/dL Final    Comment: REFERENCE RANGE:  < 200 Desirable                  200-239 Borderline High                    > 240 High      Triglycerides 07/28/2017 205* mg/dL Final    Comment: REFERENCE RANGE:  < 150 Normal                  150-199 Borderline High                  200-499 High                    > 500 Very High      HDL 07/28/2017 32  mg/dL Final    Comment: REFERENCE RANGE:  < 40 Low                    > 60 High      LDL Calculated 07/28/2017 69  mg/dL Final    Comment: REFERENCE RANGE:  < 100 Optimal                   100-129 Near or above optimal                  130-159 Borderline High                  160-189 High                    > 189 Very High      Non HDL Cholesterol 07/28/2017 110  mg/dL Final    Target is 40m/dl above(or over) LDL goal    Chol/HDL Ratio 07/28/2017 4.4   Final    Sodium 07/28/2017 144  133 - 145 mmol/L Final    Potassium 07/28/2017 4.5  3.3 - 5.1 mmol/L Final    Chloride 07/28/2017 104  96 - 108 mmol/L Final    CO2 07/28/2017 27  20 - 28 mmol/L Final    Anion Gap 07/28/2017 13  7 - 16  Final    UN 07/28/2017 18  6 - 20 mg/dL Final    Creatinine 07/28/2017 0.88  0.67 - 1.17 mg/dL Final    GFR,Caucasian 07/28/2017 98  * Final    GFR,Black 07/28/2017 114  * Final    *UNITS=mL/min/1.73 square meters    Glucose 07/28/2017 119* 60 - 99 mg/dL Final    Comment: Reference Ranges apply only to FASTING samples.    ADA Guidelines Blood Sugar Levels for Diagnosing Diabetes & Pre-diabetes  Normal: < 100 mg/dL  Impaired Fasting Glucose (IFG): 100-125 mg/dL  Diabetes:  > 126 mg/dL on two different occasions      Calcium 07/28/2017 9.3  8.6 - 10.2 mg/dL Final    Total Protein 07/28/2017 7.0  6.3 - 7.7 g/dL Final    Albumin 07/28/2017 4.8  3.5 - 5.2 g/dL Final    Bilirubin,Total 07/28/2017 0.2  0.0 - 1.2 mg/dL Final    AST 07/28/2017 26  0 - 50 U/L Final    ALT 07/28/2017 34  0 - 50 U/L Final    Alk Phos 07/28/2017 84  40 - 130 U/L Final    Hemoglobin A1C 07/28/2017 6.0* % Final    Comment: Ref Range <=5.6  HbA1c values of 5.7-6.4% indicate an increased risk for developing  diabetes mellitus.  HbA1c values greater than or equal to 6.5% are diagnostic of  diabetes mellitus.  For diagnosis of diabetes in individuals without unequivocal  hyperglycemia, results should be confirmed by repeat testing.         A:  1.  Essential hypertension -- well controlled  2.  Hypercholesterolemia -- satisfactory control  3.  CAD -- asymptomatic and hemodynamically stable  4.  Hyperglycemia -- advancing    P:  1.   Continue with current medications at their current doses.  2.  Counseled patient regarding prudent diet, exercise and weight management.  Urged him to increase his efforts in this regard.  3.  RTO in 6 months for follow up of the above problems or sooner if any other problems or concerns.  4.  Complete blood work prior to the next appointment to check an FLP, CMP, Hgb A1c and PSA.

## 2017-08-10 ENCOUNTER — Ambulatory Visit: Payer: PRIVATE HEALTH INSURANCE | Admitting: Neurology

## 2017-08-24 ENCOUNTER — Other Ambulatory Visit: Payer: Self-pay | Admitting: Primary Care

## 2017-08-26 ENCOUNTER — Encounter: Payer: Self-pay | Admitting: Gastroenterology

## 2017-08-27 ENCOUNTER — Telehealth: Payer: Self-pay

## 2017-08-27 NOTE — Telephone Encounter (Signed)
EAM- please see message below. Pt is asking for a pressure adjustment and more info on another therapy. Pt is in [place]

## 2017-08-27 NOTE — Telephone Encounter (Signed)
Patient,[name] called stated his machine isn't blowing the right amount of air and he can't come in until his scheduled appointment on 09/14/17 because that's when his insurance changes/updates and he doesn't know what to do (He was last seen in 2014).  Patient also has a question about Inspire therapy.   Patient is asking to please be called back at [phone]

## 2017-08-27 NOTE — Telephone Encounter (Signed)
I reviewed Mr. Malacara's AirView data for his CPAP unit, a ResMed AirSense 10 AutoSet, set at 5-18 cmH2O. His 95th percentile pressure has been 12.2 cmH2O. He has used CPAP for ?4 hours/night 100% of the time (30/30 days). His average usage on nights used is 9 hours and 17 minutes. Hisestimated, residual AHI was 2.7respiratory events per hour of sleep. Hisestimated, 95th percentile leak was 13.3liters per minute. His cental apnea index was 0.1 events/hour of sleep and obstructive apnea index was 2.2 events/hour of sleep.    MyChart message sent to patient informing him that sleep apnea is effectively controlled at current pressure settings. I recommended he follow up with Gwenyth Bouillon, his DME if he is concerned with the functioning of his equipment and advised that we will discuss Inspire during his follow up appointment next month.     Lavonna Monarch PA-C, 08/27/17 4:38 PM

## 2017-09-10 NOTE — Progress Notes (Deleted)
[name] - Follow Up Visit    [name] is an 52 y.o. woman with a significant past medical history of obstructive sleep apnea, headache, ***seizure, GERD, hyperlipidemia, hypertension, CAD (s/p STEMI), CHF, BPH, nicotine dependence, depression, and anxiety.  He presents to our office today for follow up of ***presently untreated obstructive sleep apnea.    He was initially seen at our office December 21, 2013 for evaluation of snoring, gasping awakenings, witnessed apnea, unrefreshing sleep, morning headaches, and daytime sleepiness. He had previously completed a home sleep test on August 12, 2013 which demonstrated at least mild obstructive sleep apnea (AHI 9.2/24 supine, weight 200) and was prescribed auto titration CPAP.     Today, [name] endorses ***.     We ***interrogated hisCPAP unit, a ***, set at ***-*** cm of H2O. His 95th percentile pressure has been *** cmH2O. He has used CPAP *** out of the past ***days (***%), and for ?4 hours/night ***% of the time (***/*** days). Hisaverage usage on nights used has been ***hours and ***minutes per night. Hisestimated, residual AHI was ***respiratory events per hour of sleep. Hisestimated, 95th percentile leak was ***liters per minute. His cental apnea index was *** events/hour of sleep and obstructive apnea index was *** events/hour of sleep.  [name] reports he {HAS HAS FIE:33295} adjusted to his *** interface.     MEDICATIONS:     Current Outpatient Prescriptions   Medication Sig    amLODIPine (NORVASC) 5 MG tablet TAKE 1 TABLET BY MOUTH ONCE DAILY    escitalopram (LEXAPRO) 10 MG tablet Take 1 tablet (10 mg total) by mouth daily    DULoxetine (CYMBALTA) 30 MG DR capsule TAKE 1 CAPSULE BY MOUTH ONCE DAILY    cyclobenzaprine (FLEXERIL) 10 MG tablet TAKE 1 TABLET BY MOUTH THREE TIMES DAILY    lisinopril (PRINIVIL,ZESTRIL) 5 MG tablet TAKE 1 TABLET BY MOUTH ONCE DAILY    omeprazole (PRILOSEC) 20 MG capsule TAKE 1 CAPSULE BY MOUTH TWICE  DAILY    atorvastatin (LIPITOR) 40 MG tablet Take 1 tablet (40 mg total) by mouth daily    nitroglycerin (NITROSTAT) 0.4 MG SL tablet Place 1 tablet (0.4 mg total) under the tongue every 5 minutes as needed   May repeat 2 times then call 911 if pain persists.    GABAPENTIN 300 MG capsule TAKE 1 CAPSULE BY MOUTH ONCE DAILY IN THE MORNING AND 2 CAPS ONCE DAILY AT BEDTIME    metoprolol (LOPRESSOR) 25 MG tablet TAKE ONE TABLET BY MOUTH TWICE DAILY    aspirin 81 MG EC tablet Take 1 tablet (81 mg total) by mouth daily    co-enzyme Q-10 (CO Q10) 100 MG capsule Take 100 mg by mouth daily     No current facility-administered medications for this visit.        PAST MEDICAL HISTORY:     Patient Active Problem List   Diagnosis Code    Anxiety Disorder NOS F41.1    Spondylolisthesis of lumbar region M43.16    Depression F32.9    Benign Prostatic Hypertrophy N40.0    Lumbar degenerative disc disease M51.36    Hyperlipidemia E78.5    HTN (hypertension) I10    CAD (coronary atherosclerotic disease) I25.10    Palpitations R00.2    Lateral epicondylitis  of elbow M77.10    GERD (gastroesophageal reflux disease) K21.9    Headache R51    Attention and concentration deficit R41.840    Difficulty swallowing R13.10    Fatigue R53.83  Obstructive sleep apnea G47.33    Transient alteration of awareness R40.4       VITALS:   There were no vitals taken for this visit.***  Neck circumference: *** inches  Epworth: ***/24    IMPRESSION & PLAN:     1. {RUSH DESC MILD/MODERATE/SEVERE:20826} Obstructive Sleep Apnea (***AHI ***):    [name] is a 52 y.o. man with a significant past medical history of obstructive sleep apnea, headache, ***seizure, GERD, hyperlipidemia, hypertension, CAD (s/p STEMI), CHF, BPH, nicotine dependence, depression, and anxiety, who endorses ***    I discussed the pathophysiology of obstructive sleep apnea and its association with cardiovascular disease. ***I feel that treatment is indicated  both to improve the quality of his sleep as well as to lessen potential cardiovascular morbidity. ***I feel that treatment is indicated to improve the quality of his sleep, but is not necessitated from a cardiovascular health standpoint. Treatment options including weight loss, CPAP, a dental appliance, and nasal/palatal surgery were discussed in detail.      CPAP is the first line treatment of choice ***in combination with weight loss and he is agreeable to ***. I am writing a prescription for an auto titration CPAP device at ***5-18 cmH2O for home usage.  I will send a prescription for the device to his home medical supplier of choice.      I encouraged [name] to use his device anytime he is sleeping for the most clinical benefit. If [name] has any difficulty acclimating to CPAP I encouraged him to contact his medical supply company for assistance.    [name] will return {Follow Up:2107546} to verify efficacy of and compliance with CPAP. I reviewed insurance compliance requirements for coverage. He was encouraged to contact our office should he have any questions or concerns.     Greater than 50% of the *** minute visit was spent counseling and educating [name].    [name], [place]

## 2017-09-14 ENCOUNTER — Ambulatory Visit: Payer: Self-pay | Admitting: Sleep Medicine

## 2017-09-17 NOTE — Progress Notes (Deleted)
[name] - Follow Up Visit    [name] is an 52 y.o. woman with a significant past medical history of obstructive sleep apnea, headache, seizures, back pain, GERD, hyperlipidemia, hypertension, CHF, CAD, BPH, and depression.  He presents to our office today for follow up of ***presently untreated obstructive sleep apnea.    He was initially seen at our office December 21, 2013 for evaluation of snoring, witnessed apnea, gasping awakenings, unrefreshing sleep, morning headaches, and daytime sleepiness. He had already completed a home sleep test on August 12, 2013 which demonstrated mild obstructive sleep apnea (uAHI 9.2, supine uAHI 24, weight 200) and was prescribed auto titration CPAP.     Today, [name] endorses ***.     We ***interrogated hisCPAP unit, an AirSense 10 AutoSet, set at ***-*** cm of H2O. His 95th percentile pressure has been *** cmH2O. He has used CPAP *** out of the past ***days (***%), and for ?4 hours/night ***% of the time (***/*** days). Hisaverage usage on nights used has been ***hours and ***minutes per night. Hisestimated, residual AHI was ***respiratory events per hour of sleep. Hisestimated, 95th percentile leak was ***liters per minute. His cental apnea index was *** events/hour of sleep and obstructive apnea index was *** events/hour of sleep.  [name] reports he {HAS HAS WNI:62703} adjusted to his *** interface.     MEDICATIONS:     Current Outpatient Prescriptions   Medication Sig    amLODIPine (NORVASC) 5 MG tablet TAKE 1 TABLET BY MOUTH ONCE DAILY    escitalopram (LEXAPRO) 10 MG tablet Take 1 tablet (10 mg total) by mouth daily    DULoxetine (CYMBALTA) 30 MG DR capsule TAKE 1 CAPSULE BY MOUTH ONCE DAILY    cyclobenzaprine (FLEXERIL) 10 MG tablet TAKE 1 TABLET BY MOUTH THREE TIMES DAILY    lisinopril (PRINIVIL,ZESTRIL) 5 MG tablet TAKE 1 TABLET BY MOUTH ONCE DAILY    omeprazole (PRILOSEC) 20 MG capsule TAKE 1 CAPSULE BY MOUTH TWICE DAILY     atorvastatin (LIPITOR) 40 MG tablet Take 1 tablet (40 mg total) by mouth daily    nitroglycerin (NITROSTAT) 0.4 MG SL tablet Place 1 tablet (0.4 mg total) under the tongue every 5 minutes as needed   May repeat 2 times then call 911 if pain persists.    GABAPENTIN 300 MG capsule TAKE 1 CAPSULE BY MOUTH ONCE DAILY IN THE MORNING AND 2 CAPS ONCE DAILY AT BEDTIME    metoprolol (LOPRESSOR) 25 MG tablet TAKE ONE TABLET BY MOUTH TWICE DAILY    aspirin 81 MG EC tablet Take 1 tablet (81 mg total) by mouth daily    co-enzyme Q-10 (CO Q10) 100 MG capsule Take 100 mg by mouth daily     No current facility-administered medications for this visit.        PAST MEDICAL HISTORY:     Patient Active Problem List   Diagnosis Code    Anxiety Disorder NOS F41.1    Spondylolisthesis of lumbar region M43.16    Depression F32.9    Benign Prostatic Hypertrophy N40.0    Lumbar degenerative disc disease M51.36    Hyperlipidemia E78.5    HTN (hypertension) I10    CAD (coronary atherosclerotic disease) I25.10    Palpitations R00.2    Lateral epicondylitis  of elbow M77.10    GERD (gastroesophageal reflux disease) K21.9    Headache R51    Attention and concentration deficit R41.840    Difficulty swallowing R13.10    Fatigue R53.83  Obstructive sleep apnea G47.33    Transient alteration of awareness R40.4       VITALS:   There were no vitals taken for this visit.  Neck circumference: *** inches  Epworth: ***/24    IMPRESSION & PLAN:     1. {RUSH DESC MILD/MODERATE/SEVERE:20826} Obstructive Sleep Apnea (***AHI ***):    [name] is a 52 y.o. man with a significant past medical history of obstructive sleep apnea, headache, seizures, back pain, GERD, hyperlipidemia, hypertension, CHF, CAD, BPH, and depression, who endorses ***    I discussed the pathophysiology of obstructive sleep apnea and its association with cardiovascular disease. ***I feel that treatment is indicated both to improve the quality of his sleep as  well as to lessen potential cardiovascular morbidity. ***I feel that treatment is indicated to improve the quality of his sleep, but is not necessitated from a cardiovascular health standpoint. Treatment options including weight loss, CPAP, a dental appliance, and nasal/palatal surgery were discussed in detail.      CPAP is the first line treatment of choice ***in combination with weight loss and he is agreeable to ***. I am writing a prescription for an auto titration CPAP device at ***5-18 cmH2O for home usage.  I will send a prescription for the device to his home medical supplier of choice.      I encouraged [name] to use his device anytime he is sleeping for the most clinical benefit. If [name] has any difficulty acclimating to CPAP I encouraged him to contact his medical supply company for assistance.    [name] will return {Follow Up:2107546} to verify efficacy of and compliance with CPAP. I reviewed insurance compliance requirements for coverage. He was encouraged to contact our office should he have any questions or concerns.     Greater than 50% of the *** minute visit was spent counseling and educating [name].    Lavonna Monarch, [place]

## 2017-09-21 ENCOUNTER — Ambulatory Visit: Payer: Self-pay | Admitting: Sleep Medicine

## 2017-10-21 ENCOUNTER — Other Ambulatory Visit: Payer: Self-pay | Admitting: Cardiology

## 2017-10-21 ENCOUNTER — Emergency Department
Admission: EM | Admit: 2017-10-21 | Discharge: 2017-10-21 | Disposition: A | Discharge status: Home / Self Care | Payer: PRIVATE HEALTH INSURANCE | Source: Ambulatory Visit | Attending: Emergency Medicine | Admitting: Emergency Medicine

## 2017-10-21 ENCOUNTER — Encounter: Payer: Self-pay | Admitting: Emergency Medicine

## 2017-10-21 DIAGNOSIS — M546 Pain in thoracic spine: Secondary | ICD-10-CM

## 2017-10-21 DIAGNOSIS — R0789 Other chest pain: Secondary | ICD-10-CM | POA: Insufficient documentation

## 2017-10-21 DIAGNOSIS — I2510 Atherosclerotic heart disease of native coronary artery without angina pectoris: Secondary | ICD-10-CM | POA: Insufficient documentation

## 2017-10-21 DIAGNOSIS — Z87891 Personal history of nicotine dependence: Secondary | ICD-10-CM | POA: Insufficient documentation

## 2017-10-21 DIAGNOSIS — R200 Anesthesia of skin: Secondary | ICD-10-CM

## 2017-10-21 DIAGNOSIS — I499 Cardiac arrhythmia, unspecified: Secondary | ICD-10-CM

## 2017-10-21 LAB — PLASMA PROF 7 (ED ONLY)
Anion Gap,PL: 14 (ref 7–16)
CO2,Plasma: 25 mmol/L (ref 20–28)
Chloride,Plasma: 103 mmol/L (ref 96–108)
Creatinine: 0.87 mg/dL (ref 0.67–1.17)
GFR,Black: 114 *
GFR,Caucasian: 99 *
Glucose,Plasma: 102 mg/dL — ABNORMAL HIGH (ref 60–99)
Potassium,Plasma: 4 mmol/L (ref 3.4–4.7)
Sodium,Plasma: 142 mmol/L (ref 133–145)
UN,Plasma: 15 mg/dL (ref 6–20)

## 2017-10-21 LAB — HOLD BLUE

## 2017-10-21 LAB — TROPONIN T 0 HR HIGH SENSITIVITY (IP/ED ONLY): TROP T 0 HR High Sensitivity: <6 ng/L (ref 0–22)

## 2017-10-21 LAB — CBC AND DIFFERENTIAL
Baso # K/uL: 0 10*3/uL (ref 0.0–0.1)
Basophil %: 0.4 %
Eos # K/uL: 0.1 10*3/uL (ref 0.0–0.5)
Eosinophil %: 1.5 %
Hematocrit: 45 % (ref 40–51)
Hemoglobin: 14.9 g/dL (ref 13.7–17.5)
IMM Granulocytes #: 0 10*3/uL
IMM Granulocytes: 0.1 %
Lymph # K/uL: 2.4 10*3/uL (ref 1.3–3.6)
Lymphocyte %: 35.7 %
MCH: 32 pg/cell (ref 26–32)
MCHC: 33 g/dL (ref 32–37)
MCV: 96 fL — ABNORMAL HIGH (ref 79–92)
Mono # K/uL: 0.6 10*3/uL (ref 0.3–0.8)
Monocyte %: 8.7 %
Neut # K/uL: 3.6 10*3/uL (ref 1.8–5.4)
Nucl RBC # K/uL: 0 10*3/uL (ref 0.0–0.0)
Nucl RBC %: 0 /100 WBC (ref 0.0–0.2)
Platelets: 341 10*3/uL — ABNORMAL HIGH (ref 150–330)
RBC: 4.7 MIL/uL (ref 4.6–6.1)
RDW: 13.2 % (ref 11.6–14.4)
Seg Neut %: 53.6 %
WBC: 6.8 10*3/uL (ref 4.2–9.1)

## 2017-10-21 LAB — HOLD SST

## 2017-10-21 MED ORDER — SODIUM CHLORIDE 0.9 % FLUSH FOR PUMPS *I*
0.0000 mL/h | INTRAVENOUS | Status: DC | PRN
Start: 2017-10-21 — End: 2017-10-22

## 2017-10-21 MED ORDER — DEXTROSE 5 % FLUSH FOR PUMPS *I*
0.0000 mL/h | INTRAVENOUS | Status: DC | PRN
Start: 2017-10-21 — End: 2017-10-22

## 2017-10-21 MED ORDER — ASPIRIN 81 MG PO CHEW *I*
243.0000 mg | CHEWABLE_TABLET | Freq: Once | ORAL | Status: AC
Start: 2017-10-21 — End: 2017-10-21
  Administered 2017-10-21: 243 mg via ORAL
  Filled 2017-10-21: qty 3

## 2017-10-21 NOTE — First Provider Contact (Signed)
ED First Provider Contact Note    Initial provider evaluation performed by   ED First Provider Contact     Date/Time Event User Comments    10/21/17 1834 ED First Provider Contact Virgie Chery Initial Face to Face Provider Contact          Vital signs reviewed.    52 y.o. male with PMH of HTN, HLD, CAD s/p STEMI  And stent who presents to the ED with complaint of left-sided chest pressure/pain and general malaise since this morning. States pain is constant to the left chest and has osme tingling pain down the left arm. Patient states he took 2 nitro and felt some relief but pain returned. Patient is on daily ASA, no other blood thinners. Denies nausea/vomiting or dizziness. States he "just doesn't feel right."    Orders placed:  EKG and LABS     Patient requires further evaluation.     Rondall Allegra, PA, 10/21/2017, 6:34 PM     Rondall Allegra, [place]  10/21/17 [phone]

## 2017-10-21 NOTE — Discharge Instructions (Signed)
You were seen today for chest discomfort.  Your initial EKG that was performed is unchanged from previous.  Urinalysis showed troponin level was less than 6.  As we discussed an ideal evaluation includes multiple troponin levels, although this report and level was drawn 11 hours from the start of your chest pain.    That is very important that you follow up with your cardiologist.    Return to the emergency department if you develop significant worsening of your chest pain, back pain, pass out or for any other symptoms of significant concern to you.
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
## 2017-10-21 NOTE — ED Notes (Signed)
I have reviewed the discharge instructions and follow-up care plan with the patient. Pt verbalizes understanding and has no questions at this time. VSS, IV removed, belongings returned and clothing in proper position. Pt's wife will be driving him home.

## 2017-10-21 NOTE — ED Triage Notes (Signed)
L sided chest pain/pressure and fatigue since this AM. States tingling down L arm. Cardiac hx including STEMI. Minimal relief with nitro x 2. EKG in triage.        Triage Note   Lilyan Punt, RN

## 2017-10-22 ENCOUNTER — Encounter: Payer: Self-pay | Admitting: Primary Care

## 2017-10-23 LAB — EKG 12-LEAD
P: 37 deg
PR: 176 ms
QRS: -18 deg
QRSD: 100 ms
QT: 400 ms
QTc: 397 ms
Rate: 59 {beats}/min
Statement: BORDERLINE
T: -37 deg

## 2017-11-10 NOTE — Telephone (Signed)
[facility] Instructions        PRIOR TO SURGERY    FOLLOW YOUR SURGEON'S INSTRUCTIONS IF DIFFERENT THAN BELOW.    Five days before surgery on 5/30, please STOP taking if surgeon does not specify:  Anti-inflammatory medications (Ibuprofen, Motrin, Advil, Mobic, Meloxicam, Aleve, Naproxen, Voltaren, etc.)    Vitamins and herbal supplements, including herbal teas     YOU MAY TAKE ACETAMINOPHEN (TYLENOL) as needed    Directions regarding your prescribed blood thinners, including aspirin, should be approved by your cardiologist or prescribing doctor.    DAY BEFORE SURGERY 6/3    Call [phone] between 2PM and 4PM to receive your arrival/surgery time.    Please arrange for transportation to and from the hospital. You must have a responsible adult to stay with you after your surgery.    Follow the bowel preparation instructions given to you by your doctor's office.    Do not eat anything after midnight the night before your surgery.      DAY OF SURGERY 6/4    Notify staff of any recent infection or signs and symptoms of infection: Cough, fever, etc.    DO NOT CONSUME FOOD OF ANY KIND    Clear liquids only until to 6 hours before your scheduled procedure time if you are have having an endoscopy.     Bowel preparation and directions for liquid consumption as instructed by your doctor's office if you are having a colonoscopy.     Please bring photo identification and your insurance card with you for registration.    DO NOT WEAR: JEWELRY, BODY LOTION OR SCENTS.     Please do not bring valuables/electronics/cash with you on the day of surgery. [facility] is not responsible for damage/loss/theft of these items. Thank you.    If wearing eyeglasses, please bring a case.DO NOT WEAR CONTACT LENSES.    You may shower, brush your teeth, and use  deodorant.    Patients are permitted 2 visitors at a time. All visitors must be 42 yrs of age or older.    MEDICATIONS: DAY OF SURGERY    Take your medications as directed according to your printed, verbal or MyChart instructions.      AT [place] in the [employer] lot located on the corner of [address].    Bring your parking ticket in with you so that it can be validated for $1 parking.     The [place] entrance is near the parking lot.Enter it and take the elevator to the second floor and look for the [place] registration area around the corner from the elevator.      QUESTIONS?    Question about these instructions? Call [phone] option 2, and leave a message for a nurse to return your call.  Any questions regarding specifics about your bowel preparation or procedure? Please call your doctor'soffice.

## 2017-11-12 ENCOUNTER — Ambulatory Visit
Admission: RE | Admit: 2017-11-12 | Discharge: 2017-11-12 | Disposition: A | Discharge status: Home / Self Care | Payer: PRIVATE HEALTH INSURANCE | Source: Ambulatory Visit

## 2017-11-12 HISTORY — DX: Obstructive sleep apnea (adult) (pediatric): G47.33

## 2017-11-12 HISTORY — DX: Nausea with vomiting, unspecified: R11.2

## 2017-11-12 NOTE — Anesthesia Preprocedure Evaluation (Addendum)
Anesthesia Pre-operative History and Physical for [name]  History and Physical Performed at [facility] ([place])  Highlighted Issues for this Procedure:  For colonoscopy    Stress Test/Echocardiography:  ECHO  Summary   Normal resting biventricular size and function.   Estimated LVEF is approximately 60%.   No significant valvular abnormalities.   The saline bubble test for right to left shunting was negative.   No evidence of stress-induced ECG changes or inducible arrhythmias.    Good exercise tolerance with appropriate augmentation of LV function.   No stress-induced regional wall motion abnormalities.    Reproduction of symptoms without objective evidence of inducible ischemia at an acceptable cardiac workload.    SPECT (03/14/13):  PERFUSION: Myocardial perfusion SPECT imaging demonstrates: (1) Normal rest and exercise stress myocardial perfusion. (2) Findings are similar to the prior study 05/25/2012.   FUNCTION: ECG-gated SPECT myocardial wall motion study shows: (1) Normal biventricular size and performance. (2) Compared to the prior study 05/25/2012, findings appear similar.   Electrophysiology/AICD/Pacer:  EKG 10/21/2017 (low risk procedure)  Cardiac /Vascular Catheterization:   STEMI (04/04/11).  He received thrombectomy and successful revascularization with a bare-metal stent to his mid left anterior descending artery.  Repeat angiogram [facility], 2017).        [name]  [facility] Summary:  [name] presents preoperatively for anesthesia evaluation prior to Colonoscopy by Dr. Milinda Pointer, reports he was scheduled in the office and was told surgeon was unable to complete b/c of his tortuous colon.  Significant for recent ED visit for chest pain on 10/21/2016 (EKG/lab work in chart), took 2 NTG prior to going to ED, workup was neg for cardiac cause.  He has a past medical history of Anxiety; Arthritis; Back pain; Benign prostatic hypertrophy; Chest pain, unspecified;  Congestive heart failure; Coronary  artery disease; Depression; Discogenic syndrome; GERD (gastroesophageal reflux disease); Heart palpitations; HTN (hypertension) (04/10/2011);OSA; PONV (postoperative nausea and vomiting); Prostatitis; Sleep apnea; Snoring; STEMI (ST elevation myocardial infarction) (04/04/2011); and Symptomatic PVCs.    Exercise tolerance:  The patient does report occas "winded" if he walks too fast or with mowing, has to take breaks, denies chest pain, is moderately active(report some days has done 18,000 steps after mowing self propelled mower - has to take breaks, walks daily 1-2.5 miles), climbs stairs and can lie flat.  Reports PONV, denies any other personal/ family issues w/ anesthesia.  Today at preop, denies any chest pain, no other cardiopulmonary issues, last saw Dr. Sydell Axon was 03/06/2018.By [place], PA at 9:39 AM on 11/12/2017    Anesthesia Evaluation Information Source: patient, records     ANESTHESIA HISTORY     Denies anesthesia history  Pertinent(-):  No History of anesthetic complications or Family hx of anesthetic complications    GENERAL     Denies general issues  Comment: Denies any open sores, wounds or rashes at preop  Pertinent (-):  No substance abuse    HEENT     Denies HEENT issues  Pertinent (-):  No hearing loss or neck pain PULMONARY     Denies pulmonary issues    + Smoker          tobacco former    + Currently Active Environmental Allergies    + Snoring    + Sleep apnea          CPAPEPAP auto cmH2O  Pertinent(-):  No asthma, shortness of breath, recent URI, cough/congestion, pulmonary testing or COPD    CARDIOVASCULAR  Denies cardiovascular issues    + Hypertension    + Hx of Myocardial Infarction    + Cardiac Testing (See above)    + CABG (Denies any CABG)    + CAD          asymptomatic    + Coronary intervention          angioplasty, bare metal stent    + CHF (Related to STEMI in 2012)  Pertinent(-):  No vascular Issues or hx of DVT    GI/HEPATIC/RENAL     Denies GI/hepatic/renal  issues  Last PO Intake: >8hr before procedure    + GERD          well controlled    + Alcohol use          social  Pertinent(-):  No liver  issues, bowel issues or urinary issues  Comment: Reports he had occas difficulty w/ swallowing liquids, has been evaluated by neuro NEURO/PSYCH    + Headaches          infrequent    + Psychiatric Issues          anxiety, depression    + Seizures (03/2017 states neuro took him off Keppra )            Last Event: 07/2017 reports was told he had "seizure activity" was related to meds that were adjusted and removed off Celebrex and decreased  Cymbalta dose    + Neuromuscular disease (Intermittent paresthesia of Hands> feet)  Pertinent(-):  No dizziness/motion sickness, syncope, cerebrovascular event, gait/mobility issues or positioning issues    ENDO/OTHER     Denies endo issues  Pertinent(-):  No diabetes mellitus, thyroid disease, steroid use    HEMATOLOGIC     Denies hematologic issues    + Blood dyscrasia          hyperlipidemia    + Arthritis          lumbar and ankle/feet  Pertinent(-):  No bruising/bleeding easily, coagulopathy or anticoagulants/antiplatelet medications       Physical Exam    Airway            Mouth opening: normal            Mallampati: II            Neck ROM: full            Comment: Very thick neck            Airway Impression: easy  Dental   Normal Exam   Cardiovascular  Normal Exam           Rhythm: regular           Rate: normal  No friction rub, systolic click, weak pulses, peripheral edema, murmur or cardiac device sound      Neurologic    Normal Exam  No sensory deficit and motor deficit    General Survey    Normal Exam  No rashes, wounds   Pulmonary   Normal Exam    breath sounds clear to auscultation    No cough, rhonchi, decreased breath sounds, stridor, wheezes, rales    Mental Status   Normal Exam    oriented to person, place and time       ________________________________________________________________________  PLAN  ASA Score  2  Anesthetic  Plan MAC     Induction (routine IV) General Anesthesia/Sedation Maintenance Plan (IV bolus); Line ( use current access); Positioning (lateral decubitus); Pain (per surgical  team); PostOp ([facility])    Informed Consent     Risks:          Risks discussed were commensurate with the plan listed above with the following specific points: N/V, aspiration, sore throat, hypotension, infection and emergence delirium , damage to:(eyes, nerves, teeth, blood vessels), allergic Rx, awareness, death, unexpected serious injury    Anesthetic Consent:         Anesthetic plan (and risks as noted above) were discussed with patient    Blood products Consent:        Use of blood products discussed with: patient and they consented    Plan also discussed with team members including:       surgeon    Attending Attestation:  As the primary attending anesthesiologist, I attest that the patient or proxy understands and accepts the risks and benefits of the anesthesia plan. I also attest that I have personally performed a pre-anesthetic examination and evaluation, and prescribed the anesthetic plan for this particular location within 48 hours prior to the anesthetic as documented. Sherryl Manges, MD 8:59 AM

## 2017-11-12 NOTE — Discharge Instructions (Signed)
[facility] Instructions                            PRIOR TO SURGERY    FOLLOW YOUR SURGEON'S INSTRUCTIONS IF DIFFERENT THAN BELOW.    Five days before surgery, please STOP taking if surgeon does not specify:   Anti-inflammatory medications (Ibuprofen, Motrin, Advil, Mobic, Meloxicam, Aleve, Naproxen, Voltaren, etc.)     Vitamins and herbal supplements, including herbal teas      YOU MAY TAKE ACETAMINOPHEN (TYLENOL) as needed     Directions regarding your prescribed blood thinners, including aspirin, should be approved by your cardiologist or prescribing doctor.    DAY BEFORE SURGERY     Call [phone] between 2PM and 4PM to receive your arrival/surgery time.       Please arrange for transportation to and from the hospital. You must have a responsible adult to stay with you after your surgery.     Follow the bowel preparation instructions given to you by your doctor's office.     Do not eat anything after midnight the night before your surgery.                                                                                          DAY OF SURGERY    Notify staff of any recent infection or signs and symptoms of infection: Cough, fever, etc.     DO NOT CONSUME FOOD OF ANY KIND     Bowel preparation and directions for liquid consumption as instructed by your doctor's office if you are having a colonoscopy.       DO NOT WEAR: JEWELRY, BODY LOTION OR SCENTS.       Please do not bring valuables/electronics/cash with you on the day of surgery. [facility] is not responsible for damage/loss/theft of these items. Thank you.                           If wearing eyeglasses, please bring a case.  DO NOT WEAR CONTACT LENSES.                             You may shower, brush your teeth, and use deodorant.     Patients are permitted 2 visitors at a time.   All visitors must be 55 yrs of age or older.    MEDICATIONS: DAY OF SURGERY     Take your medications as directed according to your  printed, verbal or MyChart instructions.          AT [place] in the [place] lot located on the corner of [address] and [address].      Bring your parking ticket in with you so that it can be validated for $1 parking.      The [place] entrance is near the parking lot.  Enter it and take the elevator to the second floor and look for the [facility] registration  area around the corner from the elevator.      QUESTIONS?     Question about these instructions? Call [phone] option 2, and leave a message for a nurse to return your call.   Any questions regarding specifics about your bowel preparation or procedure? Please call your doctor's  office.

## 2017-11-13 ENCOUNTER — Other Ambulatory Visit: Payer: Self-pay | Admitting: Primary Care

## 2017-11-13 DIAGNOSIS — M79602 Pain in left arm: Secondary | ICD-10-CM

## 2017-11-17 ENCOUNTER — Ambulatory Visit: Payer: PRIVATE HEALTH INSURANCE

## 2017-11-17 ENCOUNTER — Ambulatory Visit
Admission: RE | Admit: 2017-11-17 | Discharge: 2017-11-17 | Disposition: A | Discharge status: Home / Self Care | Payer: PRIVATE HEALTH INSURANCE | Source: Ambulatory Visit | Attending: Gastroenterology | Admitting: Gastroenterology

## 2017-11-17 ENCOUNTER — Ambulatory Visit: Payer: PRIVATE HEALTH INSURANCE | Admitting: Gastroenterology

## 2017-11-17 ENCOUNTER — Ambulatory Visit: Payer: PRIVATE HEALTH INSURANCE | Admitting: Anesthesiology

## 2017-11-17 ENCOUNTER — Encounter
Admission: RE | Disposition: A | Discharge status: Home / Self Care | Payer: Self-pay | Source: Ambulatory Visit | Attending: Gastroenterology

## 2017-11-17 DIAGNOSIS — K635 Polyp of colon: Secondary | ICD-10-CM

## 2017-11-17 DIAGNOSIS — D123 Benign neoplasm of transverse colon: Secondary | ICD-10-CM | POA: Insufficient documentation

## 2017-11-17 DIAGNOSIS — K621 Rectal polyp: Secondary | ICD-10-CM | POA: Insufficient documentation

## 2017-11-17 DIAGNOSIS — Z1211 Encounter for screening for malignant neoplasm of colon: Secondary | ICD-10-CM | POA: Insufficient documentation

## 2017-11-17 DIAGNOSIS — K641 Second degree hemorrhoids: Secondary | ICD-10-CM | POA: Insufficient documentation

## 2017-11-17 SURGERY — COLONOSCOPY
Anesthesia: Monitor Anesthesia Care

## 2017-11-17 MED ORDER — LACTATED RINGERS IV SOLN *I*
20.0000 mL/h | INTRAVENOUS | Status: DC
Start: 2017-11-17 — End: 2017-11-17
  Administered 2017-11-17: 20 mL/h via INTRAVENOUS

## 2017-11-17 MED ORDER — PROPOFOL 10 MG/ML IV EMUL (INTERMITTENT DOSING) WRAPPED *I*
INTRAVENOUS | Status: AC
Start: 2017-11-17 — End: 2017-11-17
  Filled 2017-11-17: qty 20

## 2017-11-17 MED ORDER — MIDAZOLAM HCL 1 MG/ML IJ SOLN *I* WRAPPED
INTRAMUSCULAR | Status: DC | PRN
Start: 2017-11-17 — End: 2017-11-17
  Administered 2017-11-17: 3 mg via INTRAVENOUS
  Administered 2017-11-17: 2 mg via INTRAVENOUS

## 2017-11-17 MED ORDER — LACTATED RINGERS IV SOLN *I*
125.0000 mL/h | INTRAVENOUS | Status: DC
Start: 2017-11-17 — End: 2017-11-17

## 2017-11-17 MED ORDER — HYDROMORPHONE HCL PF 0.5 MG/0.5 ML IJ SOLN *I*
0.5000 mg | INTRAMUSCULAR | Status: DC | PRN
Start: 2017-11-17 — End: 2017-11-17

## 2017-11-17 MED ORDER — SODIUM CHLORIDE 0.9 % FLUSH FOR PUMPS *I*
0.0000 mL/h | INTRAVENOUS | Status: DC | PRN
Start: 2017-11-17 — End: 2017-11-17

## 2017-11-17 MED ORDER — SODIUM CHLORIDE 0.9 % 100 ML IV SOLN *I*
6.2500 mg | Freq: Once | INTRAVENOUS | Status: DC | PRN
Start: 2017-11-17 — End: 2017-11-17

## 2017-11-17 MED ORDER — DEXTROSE 5 % FLUSH FOR PUMPS *I*
0.0000 mL/h | INTRAVENOUS | Status: DC | PRN
Start: 2017-11-17 — End: 2017-11-17

## 2017-11-17 MED ORDER — FENTANYL CITRATE 50 MCG/ML IJ SOLN *WRAPPED*
INTRAMUSCULAR | Status: AC
Start: 2017-11-17 — End: 2017-11-17
  Filled 2017-11-17: qty 2

## 2017-11-17 MED ORDER — MIDAZOLAM HCL 5 MG/5ML IJ SOLN *I*
INTRAMUSCULAR | Status: AC
Start: 2017-11-17 — End: 2017-11-17
  Filled 2017-11-17: qty 5

## 2017-11-17 MED ORDER — FENTANYL CITRATE 50 MCG/ML IJ SOLN *WRAPPED*
INTRAMUSCULAR | Status: DC | PRN
Start: 2017-11-17 — End: 2017-11-17
  Administered 2017-11-17: 100 ug via INTRAVENOUS

## 2017-11-17 MED ORDER — MEPERIDINE HCL 25 MG/ML IJ SOLN *I*
12.5000 mg | INTRAMUSCULAR | Status: DC | PRN
Start: 2017-11-17 — End: 2017-11-17

## 2017-11-17 MED ORDER — LIDOCAINE HCL 2 % (PF) IJ SOLN *I*
INTRAMUSCULAR | Status: AC
Start: 2017-11-17 — End: 2017-11-17
  Filled 2017-11-17: qty 5

## 2017-11-17 MED ORDER — LIDOCAINE HCL (PF) 1 % IJ SOLN *I*
0.1000 mL | INTRAMUSCULAR | Status: DC | PRN
Start: 2017-11-17 — End: 2017-11-17

## 2017-11-17 MED ORDER — DIPHENHYDRAMINE HCL 50 MG/ML IJ SOLN *I*
25.0000 mg | INTRAMUSCULAR | Status: DC | PRN
Start: 2017-11-17 — End: 2017-11-17

## 2017-11-17 MED ORDER — SODIUM CHLORIDE 0.9 % IV SOLN WRAPPED *I*
20.0000 mL/h | Status: DC
Start: 2017-11-17 — End: 2017-11-17

## 2017-11-17 MED ORDER — HALOPERIDOL LACTATE 5 MG/ML IJ SOLN *I*
0.5000 mg | Freq: Once | INTRAMUSCULAR | Status: DC | PRN
Start: 2017-11-17 — End: 2017-11-17

## 2017-11-17 MED ORDER — PROPOFOL 10 MG/ML IV EMUL (INTERMITTENT DOSING) WRAPPED *I*
INTRAVENOUS | Status: DC | PRN
Start: 2017-11-17 — End: 2017-11-17
  Administered 2017-11-17: 30 mg via INTRAVENOUS
  Administered 2017-11-17 (×2): 20 mg via INTRAVENOUS

## 2017-11-17 SURGICAL SUPPLY — 18 items
CAPTURA PRO FORCEPS WITH SPIKE (Other) ×2 IMPLANT
CLIP HEMOSTASIS ENDO INSTINCT 7FRX230CM (Supply) IMPLANT
ELECTRODE ADULT POLYHESIVE PAT RETURN (Supply) IMPLANT
FORCEPS BIOPSY RADIAL JAW LG (Other) IMPLANT
FORCEPS DISPOSABLE ALLIGATOR JAW W/NEEDLE (Supply) IMPLANT
PROBE CIRCUMFERENTAIL FIRE (Supply) IMPLANT
PROBE COAG 2000A 6.9FR L2.2M INTEGR STYL W/ FLTR FOR FLX ENDOSCP INTERVENTION FIAPC (Supply) IMPLANT
PROBE COAG 6.9FR L2.2M CNVNENT BLT IN FLTR 2200SC SIMPLIFIED SETUP PLUG PLAY FUNCTIONALITY (Supply) IMPLANT
PROBE SIDE FIRE (Supply)
PROBE STRAIGHT FIRE (Supply)
SNARE MICRO MINI (Supply)
SNARE MINI SOFT ACU 1.5 X 3 (Supply) ×2 IMPLANT
SNARE POLYP 1CMX1.5CM SFT GI MINI MIC OVL ACUSNR (Supply) IMPLANT
SNARE SOFT JUMBO (Supply)
SNARE SOFT JUMBO ENDO DISPO (Supply) IMPLANT
SNARE STANDARD SOFT ACU 2.5 X 5.5 (Supply) IMPLANT
TRAP POLYP MULTICHAMBER DISP OPTIMIZER (Other) ×1 IMPLANT
TRAPS POLY (Other) ×1

## 2017-11-17 NOTE — Discharge Instructions (Signed)
[facility]  Discharge Instructions For Colonoscopy    11/17/2017    9:28 AM    Findings:Polyps removed    Do not drive, operate heavy machinery, drink alcoholic beverages, make important personal or business decisions, or sign legal documents until the next day.     Instructions:   Do not take aspirin, Advil, Motrin, or other anti-inflammatory medications for 14 days.    Things You May Expect:   There may be a small amount of bright red blood in your stool.   It may be a few days before you have a bowel movement.   You may have cramping, bloating, and a "gas" feeling. These feelings should go away as you pass gas. If you still feel uncomfortable, walking around will help to pass the gas.   You were given medication to help you relax during the test. You may feel "fuzzy" and drowsy. Go home and rest for at least 4-6 hours.    You Should Call Your Doctor For Any of the Following:     Bad stomach pain.    Fever.    Bright red bleeding or clots (This may happen up to 2 weeks after the test).    Dizziness or weakness that gets worse or lasts up to 24 hours.    Pain or redness at the IV site.    If you have a serious problem after hours, Call Dr. Eusebio Me [phone] to reach the GI physician on call. If you are unable to reach your doctor, go to the [facility] Emergency Department.    Follow Up Care:  Report will be sent to your primary doctor.  Repeat exam in  5 year(s) to 7 years based on pathology.

## 2017-11-17 NOTE — Anesthesia Postprocedure Evaluation (Signed)
Anesthesia Post-Op Note    Patient: [name]    Procedure(s) Performed:  Procedure Summary  Date:  11/17/2017 Anesthesia Start: 11/17/2017  8:51 AM Anesthesia Stop: 11/17/2017  9:29 AM Room / Location:  [facility] / [place]   Procedure(s):  MAC COLONOSCOPY Diagnosis:  * No pre-op diagnosis entered * Surgeon(s):  Eusebio Me, MD Attending Anesthesiologist:  Sherryl Manges, MD         Recovery Vitals  BP: 116/77 (11/17/2017  9:55 AM)  Heart Rate: 55 (11/17/2017  9:55 AM)  Resp: 17 (11/17/2017  9:55 AM)  Temp: 36.4 C (97.5 F) (11/17/2017  9:29 AM)  SpO2: 99 % (11/17/2017  9:55 AM)   0-10 Scale: 0 (11/17/2017  9:55 AM)  Anesthesia type:  Monitor Anesthesia Care  Complications Noted During Procedure or in PACU:  None   Comment:    Patient Location:  [facility]  Level of Consciousness:    Recovered to baseline  Patient Participation:     Able to participate  Temperature Status:    Normothermic  Oxygen Saturation:    Within patient's normal range  Cardiac Status:   Within patient's normal range  Fluid Status:    Stable  Airway Patency:     Yes  Pulmonary Status:    Baseline  Pain Management:    Adequate analgesia  Nausea and Vomiting:  None    Post Op Assessment:    Tolerated procedure well   Attending Attestation:  All indicated post anesthesia care provided     -

## 2017-11-17 NOTE — Procedures (Signed)
Procedure Report    Colonoscopy Procedure Note   Date of Procedure: 11/17/2017   Referring Physician: Eusebio Me, MD  Primary Physician: [name], MD        Attending Physician: Dr. Eusebio Me      Indications: Colorectal cancer screening-average risk  Previous colonoscopy: Yes -- Date: 1/19 - incomplete due to poor prep  Medications:Agents given by the anesthesia service were administered incrementally over the course of the procedure to achieve an adequate level of conscious sedation.     Procedure Details: The patient was placed in the left lateral decubitus position and monitored continuously with ECG tracing, pulse oximetry, blood pressure monitoring and direct observations. After anorectal examination was performed, the Olympus CF-H180 was inserted into the rectum and advanced under direct vision to the terminal ileum. The procedure was considered more difficult than average. Additional maneuvers used: applied abdominal pressure.    Narrative:  During withdrawal examination, the final quality of the prep was Boston Bowel Prep Scale   Right Colon: Grade2- (minor amount of residual staining, small fragments of stool, and/or opaque liquid, but mucosa of colon segment is seen well)  Transverse Colon: Grade 2- (minor amount of residual staining, small fragments of stool, and/or opaque liquid, but mucosa of colon segment is seen well)  Left Colon: Grade 2- (minor amount of residual staining, small fragments of stool, and/or opaque liquid, but mucosa of colon segment is seen well)    A careful inspection was made as the colonoscope was withdrawn, a retroflexed view of the rectum was included; findings and interventions are described below. Appropriate photo documentation wasobtained. The patient  recovered in the the [place]  [id number] [phone])  Insertion Time: 0901  Cecum Time: 0908  Exit Time: 0921  Findings:   Anorectal:   Normal tone, no masses  Terminal Ileum:   Normal ileal mucosa   Cecum:    Normal mucosa  Ascending Colon:   Normal mucosa   Transverse Colon:   4mm polyp s/p cold snare  Descending Colon:   Normal mucosa  Sigmoid Colon:   Normal mucosa  Rectum:   Internal hemorrhoids, grade 2  59mm polyp s/p bx  Interventions:   1 polyp(s) removed by cold biopsy  1 polyp(s) removed by cold snare biopsy  Complications: The patient tolerated the procedure well.  Impression:   Multiple polyps as removed above  Internal hemorrhoids  Recommendations:    Await pathology - if adenoma is present, repeat colonoscopy in 5 year(s)    Eusebio Me, MD
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
## 2017-11-17 NOTE — Anesthesia Procedure Notes (Signed)
---------------------------------------------------------------------------------------------------------------------------------------    AIRWAY   GENERAL INFORMATION AND STAFF    Patient location during procedure: Procedure Rm       Date of Procedure: 11/17/2017 9:03 AM  CONDITION PRIOR TO MANIPULATION     Current Airway/Neck Condition:  Normal        For more airway physical exam details, see Anesthesia PreOp Evaluation  AIRWAY METHOD   Mask Difficulty Assessment:  0 - not attempted    Number of Other Approaches Attempted:  0  FINAL AIRWAY DETAILS  Final Airway Type:  Nasal cannula  ----------------------------------------------------------------------------------------------------------------------------------------

## 2017-11-17 NOTE — Anesthesia Case Conclusion (Signed)
CASE CONCLUSION  [facility]  Assessment:  Routine  Transport  Directly to: ASC  Position:  Recumbent  Patient Condition on Handoff  Level of Consciousness:  Alert/talking/calm  Patient Condition:  Stable  Handoff Report to:  RN

## 2017-11-18 LAB — SURGICAL PATHOLOGY

## 2017-12-15 ENCOUNTER — Other Ambulatory Visit: Payer: Self-pay | Admitting: Primary Care

## 2017-12-15 DIAGNOSIS — G8929 Other chronic pain: Secondary | ICD-10-CM

## 2017-12-15 DIAGNOSIS — I2510 Atherosclerotic heart disease of native coronary artery without angina pectoris: Secondary | ICD-10-CM

## 2017-12-15 DIAGNOSIS — M545 Low back pain, unspecified: Secondary | ICD-10-CM

## 2018-01-05 ENCOUNTER — Ambulatory Visit: Payer: PRIVATE HEALTH INSURANCE | Admitting: Orthopedic Surgery

## 2018-01-08 ENCOUNTER — Other Ambulatory Visit: Payer: Self-pay | Admitting: Orthopedic Surgery

## 2018-01-08 DIAGNOSIS — M25521 Pain in right elbow: Secondary | ICD-10-CM

## 2018-01-12 ENCOUNTER — Ambulatory Visit: Payer: PRIVATE HEALTH INSURANCE | Admitting: Orthopedic Surgery

## 2018-01-12 ENCOUNTER — Ambulatory Visit
Admission: RE | Admit: 2018-01-12 | Discharge: 2018-01-12 | Disposition: A | Discharge status: Home / Self Care | Payer: PRIVATE HEALTH INSURANCE | Source: Ambulatory Visit | Attending: Orthopedic Surgery | Admitting: Orthopedic Surgery

## 2018-01-12 VITALS — BP 123/62 | HR 65 | Ht 72.0 in | Wt 207.8 lb

## 2018-01-12 DIAGNOSIS — M25521 Pain in right elbow: Secondary | ICD-10-CM

## 2018-01-12 MED ORDER — METHYLPREDNISOLONE 4 MG PO TBPK *A*
ORAL_TABLET | ORAL | 0 refills | Status: DC
Start: 2018-01-12 — End: 2018-01-20

## 2018-01-12 NOTE — Progress Notes (Signed)
[name], [name] MR #:  [id number]   CSN:  [id number] DOB:  1966/01/07   DICTATED BY:  Berenice Bouton, PA DATE OF VISIT:  01/12/2018     CHIEF COMPLAINT:  Established patient, first-time evaluation, right elbow pain.    HISTORY OF PRESENT ILLNESS:  The patient is a 52 -year-old, medically retired male who presents to Orthopaedics today for the first time for evaluation of his right elbow pain, atraumatic, 2-3 months' duration, right-hand dominant.  Questionably related to throwing hard in his backyard.  No particular pain at the time.  He has tried ibuprofen 800 mg 2-3 times a day with no help.  He has been using Ben-Gay.  He complains of pain medially, laterally, and around his biceps.  He describes tingling at times.  He describes his pain an 8-10 out of 10.  His pain level is severely high.     He has seen Dr. Venia Minks in the past for his left elbow.  He has had multiple surgeries on the left elbow.  Dr. Venia Minks performed a tennis elbow surgery in 2014.  He had seen Dr. 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  He just deals with persistent elbow pain.     He states that the left elbow does not feel like the right elbow.  He has a lot more bicipital pain on the right side.    PHYSICAL EXAMINATION:  Right elbow, no joint effusion.  Good range of motion, although he complains of pain with just extending the elbow.  He complains with pain with flexing the elbow.  He has significant distal biceps pain.  Significant pain with supination against resistance.  He has pain over the lateral extensor mechanism.  He has less pain medially. I am not eliciting a subluxing ulnar nerve; however, he does not like deep flexion to complete the test.    IMAGING:  X-rays of his right elbow, 2 views, are within normal limits.  No arthritic or abnormal findings.    ASSESSMENT:  Atraumatic right elbow pain with pain levels being very high and atypical.    PLAN:  The patient has  tried topical Ben-Gay and oral ibuprofen with no relief.  His pain level is very high and very diffuse.  I prescribed a Medrol Dosepak today.  Also, we will send him to hand therapy with Mickel [facility] at [facility].  He will MyChart me in 4-6 weeks with his results.  If he is still symptomatic, discussed MRI and follow up with Dr. Venia Minks afterwards.  [facility].  He has seen Dr. Olin Pia for his back before  - but never his elbows.       Dictated By:  Berenice Bouton, PA      ______________________________  Hurman Horn, MD    KF/MODL  DD:  01/12/2018 09:43:35  DT:  01/12/2018 10:08:44  Job #:  [id number]    cc:

## 2018-01-13 ENCOUNTER — Encounter: Payer: Self-pay | Admitting: Orthopedic Surgery

## 2018-01-13 NOTE — Telephone Encounter (Signed)
I called and spoke to [name].  I don't have numbness/tingling as a SE with Medrol Dose pack.  Recommend seeing a neurologist that he has seen in the past.

## 2018-01-18 ENCOUNTER — Ambulatory Visit
Payer: PRIVATE HEALTH INSURANCE | Attending: Orthopedic Surgery | Admitting: Rehabilitative and Restorative Service Providers"

## 2018-01-18 DIAGNOSIS — M7711 Lateral epicondylitis, right elbow: Secondary | ICD-10-CM | POA: Insufficient documentation

## 2018-01-18 NOTE — Progress Notes (Signed)
[facility]  PT Initial Evaluation    History  Encounter Diagnosis   Name Primary?    Right lateral epicondylitis Yes     Berenice Bouton, Kyle  [name], [place] [id number]    Onset date: a few months ago    Subjective    [name] is a 52 y.o. male who is present today for right, elbow pain.  Mechanism of injury/history of symptoms: No specific cause    Occupation and Kyle  Work status: Retired. 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 Pain radiated up into bicep,  Relevant symptoms: Aching, Pain , Decreased ROM, Decreased strength  Symptom frequency: Intermittent  Symptom intensity (0 - 10 scale): Now 1   Symptoms worsen with: Lifting, Carrying, Gripping  Symptoms improve with: Rest, Heat  Assistive device:  Splint/brace  Patients goals for therapy: Reduce pain, Increase ROM / flexibility, Increase strength, Achieve independence with home program for self care / condition management     Objective:    Observation: WNL    All joints Right Elbow are WNL's. Note: Patient has right forearm extrinsic tightness    Hand dominance:  right     Assessment:  Findings consistent with 52 y.o., male with   Encounter Diagnosis   Name Primary?    Right lateral epicondylitis Yes     with pain, ROM limitations, strength limitations, functional limitations.    Personal factors affecting treatment/recovery:   none identified  Comorbidities affecting treatment/recovery:   none noted  Clinical presentation:   stable  Patient complexity:     low level as indicated by above stability of condition, personal factors, environmental factors and comorbidities in addition to patient symptom presentation and impairments found on physical exam.    Prognosis:  Good   Contraindications/Precautions/Limitation:  Per protocol and Per diagnosis  Short Term Goals:   Increase ROM /flexibility right forearm muscles, Increase strength right elbow and Minimal assistance with HEP/ education concepts  Long Term Goals:  Pain/Sx 0 - minimal, ROM/ flexibility WNL , Restoration of functional strength, Independent with HEP/education , Functional return to ADLs / activities without limitations      Treatment Plan:     Patient/family involved in developing goals and treatment plan: Yes  Expected length of care 8 week(s)  Freq 1 times per week for 8 weeks    Treatment plan inclusive of:   Exercise: AROM, Stretching, Strengthening, Progressive Resistive. Wrist brace issued to be used as needed   Modalities:  Iontophoresis with dexmethasone sodium phosphate 4mg /ml, Moist heat       Thank you for the referral of this patient to [facility]. Please do not hesitate to contact me if I may be of assistance.      Please submit written approval or fax [phone]    Lyndel Pleasure, MS,PT,CHT

## 2018-01-20 ENCOUNTER — Other Ambulatory Visit: Payer: Self-pay | Admitting: Cardiology

## 2018-01-20 ENCOUNTER — Emergency Department: Payer: PRIVATE HEALTH INSURANCE

## 2018-01-20 ENCOUNTER — Telehealth: Payer: Self-pay | Admitting: Cardiology

## 2018-01-20 ENCOUNTER — Observation Stay
Admission: EM | Admit: 2018-01-20 | Discharge: 2018-01-21 | Disposition: A | Discharge status: Home / Self Care | Payer: PRIVATE HEALTH INSURANCE | Source: Ambulatory Visit | Attending: Emergency Medicine | Admitting: Emergency Medicine

## 2018-01-20 ENCOUNTER — Telehealth: Payer: Self-pay

## 2018-01-20 ENCOUNTER — Encounter: Payer: Self-pay | Admitting: Student in an Organized Health Care Education/Training Program

## 2018-01-20 DIAGNOSIS — R001 Bradycardia, unspecified: Secondary | ICD-10-CM

## 2018-01-20 DIAGNOSIS — I2510 Atherosclerotic heart disease of native coronary artery without angina pectoris: Secondary | ICD-10-CM

## 2018-01-20 DIAGNOSIS — R202 Paresthesia of skin: Secondary | ICD-10-CM

## 2018-01-20 DIAGNOSIS — R072 Precordial pain: Secondary | ICD-10-CM

## 2018-01-20 DIAGNOSIS — R079 Chest pain, unspecified: Principal | ICD-10-CM | POA: Insufficient documentation

## 2018-01-20 DIAGNOSIS — E1165 Type 2 diabetes mellitus with hyperglycemia: Secondary | ICD-10-CM | POA: Insufficient documentation

## 2018-01-20 DIAGNOSIS — R002 Palpitations: Secondary | ICD-10-CM | POA: Insufficient documentation

## 2018-01-20 DIAGNOSIS — Z794 Long term (current) use of insulin: Secondary | ICD-10-CM | POA: Insufficient documentation

## 2018-01-20 DIAGNOSIS — R51 Headache: Secondary | ICD-10-CM | POA: Insufficient documentation

## 2018-01-20 DIAGNOSIS — R200 Anesthesia of skin: Secondary | ICD-10-CM

## 2018-01-20 DIAGNOSIS — R61 Generalized hyperhidrosis: Secondary | ICD-10-CM | POA: Insufficient documentation

## 2018-01-20 DIAGNOSIS — I499 Cardiac arrhythmia, unspecified: Secondary | ICD-10-CM

## 2018-01-20 LAB — HOLD BLUE

## 2018-01-20 LAB — CBC AND DIFFERENTIAL
Baso # K/uL: 0 10*3/uL (ref 0.0–0.1)
Basophil %: 0.4 %
Eos # K/uL: 0.1 10*3/uL (ref 0.0–0.5)
Eosinophil %: 1.1 %
Hematocrit: 45 % (ref 40–51)
Hemoglobin: 14.9 g/dL (ref 13.7–17.5)
IMM Granulocytes #: 0 10*3/uL
IMM Granulocytes: 0.4 %
Lymph # K/uL: 1.4 10*3/uL (ref 1.3–3.6)
Lymphocyte %: 24.7 %
MCH: 31 pg/cell (ref 26–32)
MCHC: 33 g/dL (ref 32–37)
MCV: 95 fL — ABNORMAL HIGH (ref 79–92)
Mono # K/uL: 0.5 10*3/uL (ref 0.3–0.8)
Monocyte %: 9.3 %
Neut # K/uL: 3.6 10*3/uL (ref 1.8–5.4)
Nucl RBC # K/uL: 0 10*3/uL (ref 0.0–0.0)
Nucl RBC %: 0 /100 WBC (ref 0.0–0.2)
Platelets: 297 10*3/uL (ref 150–330)
RBC: 4.8 MIL/uL (ref 4.6–6.1)
RDW: 12.8 % (ref 11.6–14.4)
Seg Neut %: 64.1 %
WBC: 5.7 10*3/uL (ref 4.2–9.1)

## 2018-01-20 LAB — PLASMA PROF 7 (ED ONLY)
Anion Gap,PL: 11 (ref 7–16)
CO2,Plasma: 27 mmol/L (ref 20–28)
Chloride,Plasma: 101 mmol/L (ref 96–108)
Creatinine: 0.9 mg/dL (ref 0.67–1.17)
GFR,Black: 112 *
GFR,Caucasian: 97 *
Glucose,Plasma: 124 mg/dL — ABNORMAL HIGH (ref 60–99)
Potassium,Plasma: 5.2 mmol/L — ABNORMAL HIGH (ref 3.4–4.7)
Sodium,Plasma: 139 mmol/L (ref 133–145)
UN,Plasma: 17 mg/dL (ref 6–20)

## 2018-01-20 LAB — TROPONIN T 3 HR W/ DELTA HIGH SENSITIVITY (IP/ED ONLY): TROP T 3 HR High Sensitivity: <6 ng/L (ref 0–21)

## 2018-01-20 LAB — POCT GLUCOSE: Glucose POCT: 100 mg/dL — ABNORMAL HIGH (ref 60–99)

## 2018-01-20 LAB — HOLD SST

## 2018-01-20 LAB — TROPONIN T 0 HR HIGH SENSITIVITY (IP/ED ONLY): TROP T 0 HR High Sensitivity: <6 ng/L (ref 0–21)

## 2018-01-20 MED ORDER — ATORVASTATIN CALCIUM 40 MG PO TABS *I*
40.0000 mg | ORAL_TABLET | Freq: Every evening | ORAL | Status: DC
Start: 2018-01-20 — End: 2018-01-21
  Administered 2018-01-20: 40 mg via ORAL
  Filled 2018-01-20: qty 1

## 2018-01-20 MED ORDER — DULOXETINE HCL 30 MG PO CPEP *I*
30.0000 mg | DELAYED_RELEASE_CAPSULE | Freq: Every evening | ORAL | Status: DC
Start: 2018-01-20 — End: 2018-01-21
  Administered 2018-01-20: 30 mg via ORAL
  Filled 2018-01-20 (×2): qty 1

## 2018-01-20 MED ORDER — SODIUM CHLORIDE 0.9 % FLUSH FOR PUMPS *I*
0.0000 mL/h | INTRAVENOUS | Status: DC | PRN
Start: 2018-01-20 — End: 2018-01-21

## 2018-01-20 MED ORDER — GABAPENTIN 100 MG PO CAPSULE *I*
200.0000 mg | ORAL_CAPSULE | Freq: Every morning | ORAL | Status: DC
Start: 2018-01-21 — End: 2018-01-21
  Administered 2018-01-21: 200 mg via ORAL
  Filled 2018-01-20: qty 2

## 2018-01-20 MED ORDER — CYCLOBENZAPRINE HCL 10 MG PO TABS *I*
10.0000 mg | ORAL_TABLET | Freq: Every evening | ORAL | Status: DC
Start: 2018-01-20 — End: 2018-01-21
  Administered 2018-01-20: 10 mg via ORAL
  Filled 2018-01-20: qty 1

## 2018-01-20 MED ORDER — METOPROLOL TARTRATE 25 MG PO TABS *I*
25.0000 mg | ORAL_TABLET | Freq: Two times a day (BID) | ORAL | Status: DC
Start: 2018-01-20 — End: 2018-01-21
  Administered 2018-01-20 – 2018-01-21 (×2): 25 mg via ORAL
  Filled 2018-01-20 (×2): qty 1

## 2018-01-20 MED ORDER — OMEPRAZOLE 20 MG PO CPDR *I*
20.0000 mg | DELAYED_RELEASE_CAPSULE | Freq: Two times a day (BID) | ORAL | Status: DC
Start: 2018-01-20 — End: 2018-01-21
  Administered 2018-01-20 – 2018-01-21 (×2): 20 mg via ORAL
  Filled 2018-01-20 (×5): qty 1

## 2018-01-20 MED ORDER — ASPIRIN 81 MG PO CHEW *I*
81.0000 mg | CHEWABLE_TABLET | Freq: Every day | ORAL | Status: DC
Start: 2018-01-21 — End: 2018-01-21
  Administered 2018-01-21: 81 mg via ORAL
  Filled 2018-01-20: qty 1

## 2018-01-20 MED ORDER — GABAPENTIN 300 MG PO CAPSULE *I*
600.0000 mg | ORAL_CAPSULE | Freq: Every evening | ORAL | Status: DC
Start: 2018-01-20 — End: 2018-01-21
  Administered 2018-01-20: 600 mg via ORAL
  Filled 2018-01-20: qty 2

## 2018-01-20 MED ORDER — ASPIRIN 81 MG PO CHEW *I*
324.0000 mg | CHEWABLE_TABLET | Freq: Once | ORAL | Status: AC
Start: 2018-01-20 — End: 2018-01-20
  Administered 2018-01-20: 324 mg via ORAL
  Filled 2018-01-20: qty 4

## 2018-01-20 MED ORDER — COENZYME Q10 100 MG ORAL SOLID WRAPPED *I*
100.0000 mg | Freq: Every day | ORAL | Status: DC
Start: 2018-01-21 — End: 2018-01-21
  Administered 2018-01-21: 100 mg via ORAL
  Filled 2018-01-20 (×2): qty 1

## 2018-01-20 MED ORDER — AMLODIPINE BESYLATE 5 MG PO TABS *I*
5.0000 mg | ORAL_TABLET | Freq: Every day | ORAL | Status: DC
Start: 2018-01-21 — End: 2018-01-21
  Administered 2018-01-21: 5 mg via ORAL
  Filled 2018-01-20: qty 1

## 2018-01-20 MED ORDER — LISINOPRIL 10 MG PO TABS *I*
5.0000 mg | ORAL_TABLET | Freq: Every day | ORAL | Status: DC
Start: 2018-01-21 — End: 2018-01-21
  Administered 2018-01-21: 5 mg via ORAL
  Filled 2018-01-20: qty 1

## 2018-01-20 MED ORDER — SODIUM CHLORIDE 0.9 % IV BOLUS *I*
500.0000 mL | Freq: Once | Status: AC
Start: 2018-01-20 — End: 2018-01-20
  Administered 2018-01-20: 500 mL via INTRAVENOUS

## 2018-01-20 MED ORDER — DEXTROSE 5 % FLUSH FOR PUMPS *I*
0.0000 mL/h | INTRAVENOUS | Status: DC | PRN
Start: 2018-01-20 — End: 2018-01-21

## 2018-01-20 MED ORDER — IBUPROFEN 600 MG PO TABS *I*
600.0000 mg | ORAL_TABLET | Freq: Four times a day (QID) | ORAL | Status: DC | PRN
Start: 2018-01-20 — End: 2018-01-21

## 2018-01-20 MED ORDER — ESCITALOPRAM OXALATE 10 MG PO TABS *I*
10.0000 mg | ORAL_TABLET | Freq: Every evening | ORAL | Status: DC
Start: 2018-01-20 — End: 2018-01-21
  Administered 2018-01-20: 10 mg via ORAL
  Filled 2018-01-20 (×2): qty 1

## 2018-01-20 NOTE — First Provider Contact (Addendum)
ED First Provider Contact Note    Initial provider evaluation performed by   ED First Provider Contact     Date/Time Event User Comments    01/20/18 [phone] ED First Provider Contact Myrene Bougher Initial Face to Face Provider Contact        52 year old male with a hx of CAD, HTN, OSA, previous MI with stent in 2012 -presents with right chest pain since this AM, left arm numbness/tingling x 2 days.  Also with some lightheadedness.  Feels like previous cardiac pain.  Took NTG with relief yesterday.    Vital signs reviewed.    Orders placed:  EKG and LABS     Patient requires further evaluation.     [place], PA, 01/20/2018, 9:06 AM       Milana Na, Maceo  01/20/18 [phone]

## 2018-01-20 NOTE — ED Notes (Signed)
Report Given To  OBS RN      Descriptive Sentence / Reason for Admission   Pt with PMH of GERD, HTN, STEMI, CAD 100% blockage with stent placed in 2012, presents to ED after experiencing dizziness, chest pain, left arm tingling and numbness. VSS. Pt is on telemetry, in gown, A&Ox4, ambulatory.      Active Issues / Relevant Events   Chest pain, dizziness, left arm tingling & numbness  A&Ox4  Ambulatory   Chest xray - negative   Trops negative      To Do List  CPAP  VS  Meds per [facility]  Assess      Anticipatory Guidance / Discharge Planning  ED workup pending

## 2018-01-20 NOTE — Telephone Encounter (Signed)
[name] called this morning with several hours of persistent chest pain.  He feels that is very reminiscent of prior myocardial infarction.  Sublingual nitroglycerin has been helpful in temporary but not alleviating discomfort.  It is associated with left hand tingling, fatigue, and dyspnea.  Given prior anterior infarction, I recommended immediate assessment through the ED via EMS.      Sandria Manly, MD
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
## 2018-01-20 NOTE — ED Notes (Signed)
Pt arrived on EOU from ed via stretcher, transferred to hospital bed without difficulty, gait steady. Pt A&Ox4, denies CP, dizziness or discomfort. Reviewed poc, call light system, location of BR, voiced understanding. Call light left within reach

## 2018-01-20 NOTE — ED Notes (Signed)
Plan of Care     Reviewed with pt and includes:   Continuous tele   VS per policy   Stress echo in AM   Maintain safety and comfort   Cards consulting   OBS provider following

## 2018-01-20 NOTE — Telephone Encounter (Signed)
Having symptoms on and off the last couple days - right side chest pain - took nitro.  Numbness and tingling in left hand.

## 2018-01-20 NOTE — ED Notes (Signed)
Pt with PMH of GERD, HTN, STEMI, CAD 100% blockage with stent placed in 2012, presents to ED after experiencing dizziness, chest pain, left arm tingling and numbness. VSS. Pt is on telemetry, in gown, A&Ox4, ambulatory. Will continue to monitor.
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
## 2018-01-20 NOTE — Telephone Encounter (Signed)
Patient directed to ED. See previous phone encounter.

## 2018-01-20 NOTE — ED Provider Progress Notes (Signed)
ED Provider Progress Note    Procedure: Emergency Medicine Training Ultrasound    Procedure performed by Blima Dessert, MD and Dr. Ernie Hew    Date: 01/20/2018   Time: 2:30 pm     Type  After consent was obtained from the patient, non-diagnostic, training ED ultrasound examinations: cardiac were performed.            Blima Dessert, MD, 01/20/2018, 3:36 PM     Blima Dessert, MD  Resident  01/20/18 [phone]
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
## 2018-01-20 NOTE — ED Notes (Signed)
Patient resting comfortably. Alert and oriented. Telemetry reading NSR. Plan discussed. Medications given. Assessment done at this time. Call bell/bedside table within reach. Will continue to monitor patient and treat per orders.

## 2018-01-20 NOTE — ED Notes (Signed)
Report Given To  Temple Pacini RN       Descriptive Sentence / Reason for Admission   Pt with PMH of GERD, HTN, STEMI, CAD 100% blockage with stent placed in 2012, presents to ED after experiencing dizziness, chest pain, left arm tingling and numbness. VSS. Pt is on telemetry, in gown, A&Ox4, ambulatory.      Active Issues / Relevant Events   Chest pain, dizziness, left arm tingling & numbness  A&Ox4  Ambulatory   Chest xray - negative       To Do List  1230 3 HR TROP  VS  Meds per [facility]  Assess      Anticipatory Guidance / Discharge Planning  ED workup pending

## 2018-01-20 NOTE — ED Provider Progress Notes (Signed)
Procedure: Emergency Medicine Training Ultrasound    Procedure performed by Filbert Schilder, MD  Date: 01/20/2018   Time: 12:10 pm    Type  After consent was obtained from the patient, non-diagnostic, training ED ultrasound examinations: urinary tract, trauma, cardiac and thoracic, IVC were performed       Filbert Schilder, MD  Resident  01/20/18 1247

## 2018-01-20 NOTE — ED Triage Notes (Addendum)
Numbness and tingling in L arm x2 days with associated R sided chest pain that started this AM. Stated that he had a 100% blockage in his LAD in 2012, and these symptoms feel similar. Took nitro yesterday with some relief. Hx: STEMI, CHF, CAD, HTN. EKG in triage.        Triage Note   Alma Downs, RN

## 2018-01-20 NOTE — ED Notes (Signed)
Received report from Sam LPN and Larry Sierras. Assuming pt care, pt visualized to be in NAD, will continue to monitor and treat per MD orders.

## 2018-01-20 NOTE — ED Notes (Signed)
Plan of Care     Telemetry  Cards consult  Stress test  CPAP  Vitals  Labs  Regular diet   NPO after midnight

## 2018-01-20 NOTE — ED Notes (Signed)
ED RN INTERN ATTESTATION       I Joylene Igo, RN (RN) reviewed the following charting information by the RN intern: [name]    Nursing Assessments  Medications  Plan of Care  Teaching   Notes    In the chart of [name] (52 y.o. male) and attest to the charting being accurate.

## 2018-01-20 NOTE — ED Notes (Signed)
Patient to Chest X-Ray.

## 2018-01-20 NOTE — Progress Notes (Addendum)
01/20/18 1700   [facility] Patient Class Review   Patient Class Review Observation   Patient Class Effective 01/20/18    Elder Negus RN  Utilization Management  Pager [phone]  Phone [phone]    Notified patient of observation level of care. Patient stated understanding. Provided [facility] phone number for patient questions. Original notification and acknowledgement of receipt to medical record. Copies to patient.

## 2018-01-21 ENCOUNTER — Observation Stay: Payer: PRIVATE HEALTH INSURANCE

## 2018-01-21 ENCOUNTER — Ambulatory Visit: Payer: PRIVATE HEALTH INSURANCE | Admitting: Rehabilitative and Restorative Service Providers"

## 2018-01-21 DIAGNOSIS — R079 Chest pain, unspecified: Secondary | ICD-10-CM

## 2018-01-21 LAB — EKG 12-LEAD
P: 51 deg
PR: 184 ms
QRS: 22 deg
QRSD: 104 ms
QT: 396 ms
QTc: 389 ms
Rate: 58 {beats}/min
T: -28 deg

## 2018-01-21 LAB — EXERCISE STRESS ECHO COMPLETE
Aortic Diameter (mid tubular): 2.8 cm
Aortic Diameter (sinus of Valsalva): 3.2 cm
BMI: 27.9 kg/m2
BP Diastolic: 62 mmHg
BP Systolic: 113 mmHg
BSA: 2.17 m2
Deceleration Time - MV: 253 ms
E/A ratio: 1.02
Heart Rate: 61 {beats}/min
Height: 72 in
LA Diameter BSA Index: 1.9 cm/m2
LA Diameter Height Index: 2.3 cm/m
LA Diameter: 4.2 cm
LV ASE Mass BSA Index: 89.5 gm/m2
LV ASE Mass Height 2.7 Index: 38 gm/m2.7
LV ASE Mass Height Index: 106.2 gm/m
LV ASE Mass: 194.2 gm
LV CO BSA Index: 2.53 L/min/m2
LV Cardiac Output: 5.49 L/min
LV Diastolic Volume Index: 66.8 mL/m2
LV Posterior Wall Thickness: 1 cm
LV SV - LVOT SV Diff: 0.63 mL
LV SV BSA Index: 41.5 mL/m2
LV SV Height Index: 49.2 mL/m
LV Septal Thickness: 1 cm
LV Stroke Volume: 90 mL
LV Systolic Volume Index: 25.3 mL/m2
LVED Diameter BSA Index: 2.4 cm/m2
LVED Diameter Height Index: 2.8 cm/m
LVED Diameter: 5.2 cm
LVED Volume BSA Index: 66.8 mL/m2
LVED Volume BSA Index: 67 ml/m2
LVED Volume Height Index: 79.3 mL/m
LVED Volume: 145 mL
LVEF (Volume): 62 %
LVES Volume BSA Index: 25 ml/m2
LVES Volume BSA Index: 25.3 mL/m2
LVES Volume Height Index: 30.1 mL/m
LVES Volume: 55 mL
LVOT Area (calculated): 4.08 cm2
LVOT Cardiac Index: 2.51 L/min/m2
LVOT Cardiac Output: 5.45 L/min
LVOT Diameter: 2.28 cm
LVOT PWD VTI: 21.9 cm
LVOT PWD Velocity (mean): 75.5 cm/s
LVOT PWD Velocity (peak): 112.9 cm/s
LVOT SV BSA Index: 41.18 mL/m2
LVOT SV Height Index: 48.9 mL/m
LVOT Stroke Rate (mean): 308.1 mL/s
LVOT Stroke Rate (peak): 460.7 mL/s
LVOT Stroke Volume: 89.37 cc
MPHR: 167 {beats}/min
MR Regurgitant Fraction (LV SV Mtd): 0.01
MR Regurgitant Volume (LV SV Mtd): 0.6 mL
MV Peak A Velocity: 69 cm/s
MV Peak E Velocity: 70.6 cm/s
Mitral Annular E/Ea Vel Ratio: 8.97
Mitral Annular Ea Velocity: 7.87 cm/s
Peak DBP: 60 mmHg
Peak HR: 137 {beats}/min
Peak SBP: 140 mmHg
Percent MPHR: 82 %
RPP: 19180 BPM x mmHG
RR Interval: 983.61 ms
Stress Peak Stage: 3.49
Stress duration (min): 10 min
Stress duration (sec): 29 s
Weight (lbs): 205 [lb_av]
Weight: 3280 oz

## 2018-01-21 LAB — PLASMA PROF 7 (ED ONLY)
Anion Gap,PL: 12 (ref 7–16)
CO2,Plasma: 27 mmol/L (ref 20–28)
Chloride,Plasma: 102 mmol/L (ref 96–108)
Creatinine: 0.84 mg/dL (ref 0.67–1.17)
GFR,Black: 115 *
GFR,Caucasian: 100 *
Glucose,Plasma: 111 mg/dL — ABNORMAL HIGH (ref 60–99)
Potassium,Plasma: 4.3 mmol/L (ref 3.4–4.7)
Sodium,Plasma: 141 mmol/L (ref 133–145)
UN,Plasma: 15 mg/dL (ref 6–20)

## 2018-01-21 MED ORDER — PERFLUTREN PROTEIN A MICROSPH (OPTISON) IV SUSP *I*
1.5000 mL | INTRAVENOUS | Status: DC | PRN
Start: 2018-01-21 — End: 2018-01-21
  Administered 2018-01-21 (×2): 1.5 mL via INTRAVENOUS

## 2018-01-21 NOTE — Discharge Instructions (Signed)
Please be advised that you were seen and evaluated in the Emergency Department and stayed overnight in the Observation unit for further evaluation of your chest pain. Your cardiac enzymes were normal. You underwent a stress ECHO which was reviewed by Cardiology and no acute abnormalities were seen.     Please continue your home medications as previously prescribed.    Please call your regular doctor and let them know that you were seen in the Emergency department today and stayed overnight in the Observation area for further evaluation of your chest pain. Please follow up with them in the next 3-5 days for re-evaluation.    Please call your Cardiologist, Dr. Sydell Axon to schedule an appointment to follow up in the next 1-2 weeks for re-evaluation.    Please return to the Emergency department immediately if you develop worsening pain, persistent fevers/chills or shortness of breath.

## 2018-01-21 NOTE — ED Notes (Signed)
Pt A&Ox4.  Assessment complete, medication administered, discussed plan of care.  Pt denies CP, SOB, dizziness, nausea.  Pt states he feels back to his normal self with no chest pain at all.  Will continue to monitor.  Bed in lowest position, call light within reach.

## 2018-01-21 NOTE — ED Obs Notes (Signed)
ED OBSERVATION PROGRESS NOTE     Patient seen by me today, 01/21/2018 at 10:31 AM    Current patient status: Observation    Chief Complaint:   Chief Complaint   Patient presents with    Chest Pain       Subjective:  The patient is a 52 yo man with PMHx sig for HTN, HLD, CAD - s/p MI 03/2011 with bare metal stent to the LAD, CHF,  anxiety/depression, GERD, OSA, BPH, ADD who is placed in obs after evaluation in the ED for cp.  The patient underwent stress echo this morning.  He states that he exercised 10 min and 29 sec but did develop chest tightness and discomfort between his shoulder blades toward the end.  He thinks that the discomfort between his shoulder blades was due to hanging onto the treadmill at the angle that he was at.    Nursing Pain Score:  Last Nursing documented pain:  0-10 Scale: 0 (01/21/18 0603)      Vitals: Patient Vitals for the past 24 hrs:   BP Temp Temp src Pulse Resp SpO2 Height Weight   01/21/18 1005 112/55 36.3 C (97.3 F) TEMPORAL 83 16 96 % - -   01/21/18 0825 113/62 - - 61 - - 1.829 m (6') 93 kg (205 lb)   01/21/18 0603 113/56 36.6 C (97.9 F) TEMPORAL 68 16 96 % - -   01/21/18 0011 114/52 36 C (96.8 F) TEMPORAL 60 16 97 % - -   01/20/18 2121 122/58 36.5 C (97.7 F) TEMPORAL 63 16 98 % - -   01/20/18 1747 133/68 36.6 C (97.9 F) TEMPORAL 67 16 95 % - -   01/20/18 1730 125/70 - - 64 17 93 % - -   01/20/18 1645 121/79 - - 72 17 96 % - -   01/20/18 1137 102/61 - - 57 16 97 % - -   01/20/18 1108 115/76 36.8 C (98.2 F) TEMPORAL 57 15 99 % - -       Physical Examination:  Physical Exam  Gen: 52 yo man in NAD  Lungs: Good excursion.  Clear.  Heart: RRR. S1, S2  Abd: Soft. Nontender. BS+  Extr: no edema    EKG: none today    Lab Results:     Recent Labs  Lab 01/20/18  0933   WBC 5.7   Hemoglobin 14.9   Hematocrit 45   Platelets 297         Lab results: 01/21/18  0610 01/20/18  0933 10/21/17  1935   Sodium,Plasma 141 139 142   Potassium,Plasma 4.3 5.2* 4.0   Chloride,Plasma 102 101 103    CO2,Plasma 27 27 25    UN,Plasma 15 17 15    Creatinine 0.84 0.90 0.87   GFR,Caucasian 100 97 99   GFR,Black 115 112 114   Glucose,Plasma 111* 124* 102*       Imaging findings: *chest Standard Frontal And Lateral Views    Result Date: 01/20/2018  No acute cardiopulmonary disease. END OF IMPRESSION [facility] submits this DICOM format image data and final report to the [facility], an independent secure electronic health information exchange, on a reciprocally searchable basis (with patient authorization) for a minimum of 12 months after exam date.      Assessment: 52 yo man with #1 - chest pain - He ruled out for ACS with serial hs troponins.  His stress echo is pending.                                                   #  2 - HTN - to continue amlodipine, lisinopril and metoprolol                                                   #3 - HLD - to continue atorvastatin                                                   #4 - CAD - s/p MI 03/2011 with bare metal stent to the LAD - to continue asa, metoprolol and atorvastatin                                                   #5 - anxiety/depression - to continue duloxetine and escitalopram                                                   #6 - GERD - to continue PPI                                                   #7 - OSA - to continue cpap                                                   #8 - BPH                                                   #9 - ADD                                                  #10 - h/o CHF            Plan: as above    Medically preferred DVT prophylaxis: None         Author: Dorris Carnes, MD  Note created: 01/21/2018  at: 10:31 AM     Dorris Carnes, MD  01/21/18 1038

## 2018-01-21 NOTE — ED Notes (Signed)
Writer discussed discharge instructions.  Pt verbalized understanding of instructions.  IV and Tele removed prior to discharge.
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
## 2018-01-21 NOTE — ED Notes (Signed)
Plan of Care   Echo  CPAP at HS  Tele  Comfort measures as needed  Labs  Vitals q4h  Pain medication

## 2018-01-21 NOTE — ED Obs Notes (Signed)
ED OBSERVATION DISCHARGE NOTE    Patient:  [name]  01/21/18   2:05 PM    The patient is now stable for discharge.     Labs:   All labs in the last 24 hours:   Recent Results (from the past 24 hour(s))   Plasma profile 7    Collection Time: 01/21/18  6:10 AM   Result Value Ref Range    Chloride,Plasma 102 96 - 108 mmol/L    CO2,Plasma 27 20 - 28 mmol/L    Potassium,Plasma 4.3 3.4 - 4.7 mmol/L    Sodium,Plasma 141 133 - 145 mmol/L    Anion Gap,PL 12 7 - 16    UN,Plasma 15 6 - 20 mg/dL    Creatinine 0.84 0.67 - 1.17 mg/dL    GFR,Caucasian 100 *    GFR,Black 115 *    Glucose,Plasma 111 (H) 60 - 99 mg/dL   Exercise Stress Echo Complete    Collection Time: 01/21/18  9:28 AM   Result Value Ref Range    BSA 2.17 m2    Height 72.000 in    BMI 27.9 kg/m2    Weight 3,280 oz    Weight (lbs) 205.00 lbs    Heart Rate 61 bpm    RR Interval 161.09 ms    BP Systolic 604 mmHg    BP Diastolic 62 mmHg    MPHR 540.9 bpm    LV Septal Thickness 1.1 cm    LVED Diameter 5.0 cm    LVOT Diameter 2.28 cm    LV Posterior Wall Thickness 1.2 cm    LVOT PWD Velocity (peak) 112.9 cm/s    LVOT PWD Velocity (mean) 75.5 cm/s    LVOT PWD VTI 21.9 cm    LA Diameter 4.2 cm    MV Peak A Velocity 69.0 cm/s    Deceleration Time - MV 253 ms    MV Peak E Velocity 70.6 cm/s    Mitral Annular Ea Velocity 7.87 cm/s    Aortic Diameter (mid tubular) 2.8 cm    Aortic Diameter (sinus of Valsalva) 3.2 cm    LVED Diameter BSA Index 2.3 cm/m2    LVED Diameter Height Index 2.7 cm/m    LVOT Area (calculated) 4.08 cm2    LVOT Stroke Volume 89.37 cc    LVOT SV BSA Index 41.18 mL/m2    LVOT SV Height Index 48.9 mL/m    LVOT Cardiac Output 5.45 L/min    LVOT Cardiac Index 2.51 L/min/m2    LVOT Stroke Rate (peak) 460.7 mL/s    LVOT Stroke Rate (mean) 308.1 mL/s    LV ASE Mass 220.3 gm    LV ASE Mass BSA Index 101.5 gm/m2    LV ASE Mass Height Index 120.4 gm/m    LV ASE Mass Height 2.7 Index 43.2 gm/m2.7    Mitral Annular E/Ea Vel Ratio 8.97     LA Diameter BSA Index  1.9 cm/m2    LA Diameter Height Index 2.3 cm/m    E/A ratio 1.02     Peak SBP 140.0 mmHg    Peak DBP 60.0 mmHg    RPP 19,180 BPM x mmHG    Stress Peak Stage 3.49     Stress duration (sec) 29 sec    Percent MPHR 82.0 %    Stress duration (min) 10 min    Peak HR 137.0 bpm     Imaging findings: as follows   *chest Standard Frontal And Lateral Views  Result  Date: 01/20/2018  No acute cardiopulmonary disease.     EKG: normal EKG, normal sinus rhythm    Cardiac Testing:  Stress Test:   Stress ECHO was reviewed by Dr. Farrel Conners, Cardiology and shows no ischemic    Consults: Cardiology      Assessment: Patient is a 52yo male with a PMH of CAD, HTN, HLD, GERD, heart failure, OSA on CPAP, BPH and anxiety/depression who presented to the ED with complaints of chest pain.    Diagnoses that have been ruled out:   None   Diagnoses that are still under consideration:   None   Final diagnoses:   Chest pain, unspecified type   Chest pain           Plan:  1) Chest pain:  -Serial troponins normal  -Stress ECHO was reviewed by Dr. Farrel Conners and no ischemic findings  -Patient to be discharged home to continue current routine of medications  -Patient to follow up with PCP in the next 2-3 days  -Patient to follow up with Dr. Sydell Axon, Cardiology in the next 1-2 weeks for re-evaluation  -Patient to be discharged home    Disposition: Home  Follow-up:  within the next 2-5 days.  with  Dr. [name] and 1-2 weeks with Dr. Sydell Axon  Smoking Cessation: NA  Prescriptions: None        Author: Barnabas Harries, NP  Note created: 01/21/2018  at: 2:04 PM     Barnabas Harries, NP  01/21/18 1413

## 2018-01-22 ENCOUNTER — Telehealth: Payer: Self-pay

## 2018-01-22 NOTE — Progress Notes (Signed)
In System Transitions Care Management Documentation:        Hospital Admission Date/Discharge Date: 01/20/2018 To 01/21/2018     Discharge Diagnosis at the time of discharge:    1. Chest pain, unspecified type    2. Chest pain         Hospital Area Discharged From: [facility]     Discharge Disposition:  Home or Self Care     Course of Hospital Stay:Plan:  1) Chest pain:  -Serial troponins normal  -Stress ECHO was reviewed by Dr. Farrel Conners and no ischemic findings  -Patient to be discharged home to continue current routine of medications  -Patient to follow up with PCP in the next 2-3 days  -Patient to follow up with Dr. Sydell Axon, Cardiology in the next 1-2 weeks for re-evaluation  -Patient to be discharged home    Disposition: Home  Follow-up:  within the next 2-5 days.  with  Dr. [name] and 1-2 weeks with Dr. Sydell Axon  Smoking Cessation: NA  Prescriptions: None       Future Appointments:     Future Appointments       Provider [place]    01/25/2018 9:30 AM Lyndel Pleasure, MS,PT,CHT [facility] and Upper [facility]rem[facility]     01/28/2018 9:30 AM Lyndel Pleasure, MS,PT,CHT [facility] Hand and Upper Extremity Rehabilitation     02/01/2018 8:30 AM [name], MD [place]     02/02/2018 9:30 AM Lyndel Pleasure, MS,PT,CHT [facility] and Upper Extrem[facility]     02/05/2018 9:30 AM Lyndel Pleasure, MS,PT,CHT [facility] Hand and Upper Extremity Rehabilitation           Significant Med Changes:           Lonell Grandchild, RN  01/22/2018

## 2018-01-22 NOTE — Telephone Encounter (Signed)
In System Transitions Care Management Documentation:        Hospital Admission Date/Discharge Date: 01/20/2018 To 01/21/2018     Discharge Diagnosis at the time of discharge:    1. Chest pain, unspecified type    2. Chest pain         Hospital Area Discharged From: [facility]     Discharge Disposition:  Home or Self Care     Course of Hospital Stay:Plan:  1) Chest pain:  -Serial troponins normal  -Stress ECHO was reviewed by Dr. Farrel Conners and no ischemic findings  -Patient to be discharged home to continue current routine of medications  -Patient to follow up with PCP in the next 2-3 days  -Patient to follow up with Dr. Sydell Axon, Cardiology in the next 1-2 weeks for re-evaluation  -Patient to be discharged home    Disposition: Home  Follow-up:  within the next 2-5 days.  with  Dr. [name] and 1-2 weeks with Dr. Sydell Axon  Smoking Cessation: NA  Prescriptions: None       Future Appointments:     Future Appointments       Provider [place]    01/25/2018 9:30 AM Lyndel Pleasure, MS,PT,CHT [facility] Extrem[facility] Rehabilitation     01/28/2018 9:30 AM Lyndel Pleasure, MS,PT,CHT [facility] Hand and Upper Extremity Rehabilitation     02/01/2018 8:30 AM [name], MD [place]     02/02/2018 9:30 AM Lyndel Pleasure, MS,PT,CHT [facility] Hand and Upper Extremity Rehabilitation     02/05/2018 9:30 AM Lyndel Pleasure, MS,PT,CHT [facility] Hand and Upper Extremity Rehabilitation           Significant Med Changes:           Lonell Grandchild, RN  01/22/2018       Attempted call to pt, left message to get apt with dr [name] M,T,or W next week , to call me directly at [phone] for questions or concerns.      SECOND ATTEMPT TO CALL PT-  LEFT MESSAGE WITH DIRECT CONTACT.       Care Manager [place] Discharge Phone Call         Discharge medications reviewed against outpatient MEDICAL RECORD NUMBER    Reviewed medications with:      Patient has all new medications and is taking them as prescribed:     Caregiver  information:      Transportation for next appointment secured, by whom:    Home care agency and services provided:     Home care contacted patient yet:     Equipment needs:     Anticoagulation therapy      Who is managing anticoagulation?     Future Labs needed:     Future imaging:      Patient report / concerns about present health      Patient Care Management assessment and plan of care/next steps:           Lonell Grandchild, RN  01/22/2018

## 2018-01-25 ENCOUNTER — Ambulatory Visit: Payer: PRIVATE HEALTH INSURANCE | Admitting: Rehabilitative and Restorative Service Providers"

## 2018-01-25 LAB — EKG 12-LEAD
P: 47 deg
PR: 188 ms
QRS: -10 deg
QRSD: 100 ms
QT: 408 ms
QTc: 394 ms
Rate: 56 {beats}/min
T: -35 deg

## 2018-01-26 ENCOUNTER — Telehealth: Payer: Self-pay

## 2018-01-26 NOTE — Telephone Encounter (Signed)
Hospital/ ED Discharge    ED or Hospital: ED   ED is 20 minutes, hospital is 40    Which hospital: [facility]    Dates admitted: 01/20/18 - 01/21/18    Reason for admission Chest pain    Follow up scheduled? 01/27/18      Please send to CM, DC and PCP

## 2018-01-27 ENCOUNTER — Ambulatory Visit: Payer: PRIVATE HEALTH INSURANCE | Attending: Primary Care | Admitting: Primary Care

## 2018-01-27 ENCOUNTER — Encounter: Payer: Self-pay | Admitting: Primary Care

## 2018-01-27 VITALS — BP 96/68 | Ht 72.0 in | Wt 208.0 lb

## 2018-01-27 DIAGNOSIS — I2510 Atherosclerotic heart disease of native coronary artery without angina pectoris: Secondary | ICD-10-CM

## 2018-01-27 DIAGNOSIS — I209 Angina pectoris, unspecified: Secondary | ICD-10-CM

## 2018-01-27 DIAGNOSIS — I10 Essential (primary) hypertension: Secondary | ICD-10-CM

## 2018-01-27 NOTE — Progress Notes (Signed)
In System Transitions Care Management Documentation:      Hospital Admission Date/Discharge Date: 01/20/2018 To 01/21/2018    Discharge Diagnosis at the time of discharge:    1. Chest pain, unspecified type    2. Chest pain        Hospital Area Discharged From: [facility]    Discharge Disposition:  Home or Self Care    Course of Hospital Stay:Plan:  1) Chest pain:  -Serial troponins normal  -Stress ECHO was reviewed by Dr. Farrel Conners and no ischemic findings  -Patient to be discharged home to continue current routine of medications  -Patient to follow up with PCP in the next 2-3 days  -Patient to follow up with Dr. Sydell Axon, Cardiology in the next 1-2 weeks for re-evaluation  -Patient to be discharged home    Disposition: Home  Follow-up: within the next 2-5 days.with Dr. [name] and 1-2 weeks with Dr. Sydell Axon  Smoking Cessation: NA  Prescriptions: None       Future Appointments:            Future Appointments       Provider [place]    01/25/2018 9:30 AM Lyndel Pleasure, MS,PT,CHT [facility] Hand and Upper Extremity Rehabilitation     01/28/2018 9:30 AM Lyndel Pleasure, MS,PT,CHT [facility] Hand and Upper Extremity Rehabilitation     02/01/2018 8:30 AM [name], MD [place]     02/02/2018 9:30 AM Lyndel Pleasure, MS,PT,CHT [facility] Upper Extrem[facility]     02/05/2018 9:30 AM Lyndel Pleasure, MS,PT,CHT [facility] Hand and Upper Extremity Rehabilitation           Significant Med Changes:           Kyle Grandchild, RN  01/22/2018       Attempted call to pt, left message to get apt with dr [name] M,T,or W next week , to call me directly at [phone] for questions or concerns.      SECOND ATTEMPT TO CALL PT-  LEFT MESSAGE WITH DIRECT CONTACT.       Care Manager [place] Discharge Phone Call         Discharge medications reviewed against outpatient MEDICAL RECORD NUMBER    Reviewed medications with:     Patient has all new medications and is taking them as  prescribed:     Caregiver information:    Transportation for next appointment secured, by whom:    Home care agency and services provided:     Home care contacted patient yet:     Equipment needs:     Anticoagulation therapy      Who is managing anticoagulation?     Future Labs needed:     Future imaging:     Patient report / concerns about present health      Patient Care Management assessment and plan of care/next steps:         Kyle Grandchild, RN  01/22/2018    S:  [name] presents to the office for follow up of a recent hospitalization.    He was experiencing a squeezing pain on the right side of his chest on 01/19/18.  There is no obvious trigger.  He had not been exerting himself.  He had no associated palpitations or shortness of breath.  He was aware of dullness as well as a pins and needles sensation in his left hand.  He took sublingual nitroglycerin and his symptoms resolved.  The following morning he experienced  a recurrence of the same symptoms.  He called the office of his cardiologist, Dr. Sydell Axon and was instructed to go to the [facility] ED for further evaluation.    In the ED he underwent serial troponins which proved negative.  His EKG was reassuring.  He underwent a stress echocardiogram which was negative for evidence of myocardial ischemia.  He returned home the following day.  He has had no recurrent chest pain since that time.    He continues with his usual medications including metoprolol, lisinopril, amlodipine, atorvastatin, Cymbalta, Lexapro, omeprazole, gabapentin, cyclobenzaprine, co-Q10, fluticasone nasal spray, baby aspirin and Nitrostat.  He denies any problems with headaches, dizziness, vision changes, palpitations, shortness of breath, heartburn, nausea, diarrhea, constipation, bleeding problems or fatigue.  He continues to experience intermittent dullness, pins and needles in his left hand.    He is doing his best to watch his eating habits and stay active although he  does not have a regular exercise routine.  He is not in the habit of checking his blood pressures at home.    O:  General: Alert, appropriate, pleasant man in no apparent distress.  Neck: Supple, no lymphadenopathy, no thyromegaly, 2+ carotid pulses bilaterally.  Heart: Regular rate and rhythm, no murmur.  Lungs: Clear to auscultation bilaterally.  Abdomen: Positive bowel sounds, soft, nontender, nondistended, no masses, no organomegaly.  Extremities: 2+ radial and 2+ posterior tibial pulses bilaterally.  No clubbing, cyanosis or edema.  Neuro: Patient has dull sensation in a glove like distribution on the left hand.    A:  1.  Angina pectoris  2.  CAD -- negative for MI  3.  Essential hypertension -- well controlled    P:  1.  Continue with current medications at their current doses.  2.  Continue to maintain efforts on prudent diet, exercise and weight management.  3.  Follow up with cardiologist, Dr. Sydell Axon in the near future.  4.  RTO in 5 days as already scheduled.  5.  Complete blood work prior to the next appointment as previously discussed.

## 2018-01-28 ENCOUNTER — Ambulatory Visit: Payer: PRIVATE HEALTH INSURANCE | Admitting: Rehabilitative and Restorative Service Providers"

## 2018-01-28 DIAGNOSIS — M7711 Lateral epicondylitis, right elbow: Secondary | ICD-10-CM

## 2018-01-28 NOTE — Progress Notes (Signed)
[facility] Hand / Upper Extremity Rehabilitation Treatment Note    Todays Date: 01/28/2018    Name: ANJEL PARDO  DOB: Aug 23, 1965  Referring Physician: Berenice Bouton, PA  Diagnosis:   1. Right lateral epicondylitis         Visit #: 2  Note: Patient had a difficult week as he was hospitalized for his heart this past week.    Subjective:  Pain Assessment: 2  Pt reports an improvement in ability to perform functional activities since prior treatment session.       Objective:  ROM -  Right, Elbow joints are WNL's. Patient complains of some discomfort/achy pain along volar aspect of forearm.  Strength - Therapeutic Exercises per flowsheet  Function: - Improved  Education:  Updated HEP. Reviewed stretching and strengthening exercises and lifting techniques    Objective        Treatment:  Moist heat, Phonophoresis with 10% HC  1MHz    Assessment:   Patient is responding favorably.       Plan of Care:  Continue per Plan of care -  As written; Patient would benefit from skilled rehabilitation services to address the above impairments to restore functional capacity.    Thank you for referring this patient to [place], MS,PT,CHT    Time Calculation Minutes   Time based CPT codes 48   Service Based CPT codes    Unbilled time (rest, etc) 15       Total Treatment Time 60     HEat X   Stretching X   Wrist E/F 3/35   Wrist RD/UD 3/35   P/S 2/35   Grip #3/35   Bike 11min/3min   Juxisor X   Ball on stick 30 reps   [employer] 2kg/10   Phono X

## 2018-01-29 ENCOUNTER — Other Ambulatory Visit
Admission: RE | Admit: 2018-01-29 | Discharge: 2018-01-29 | Disposition: A | Discharge status: Home / Self Care | Payer: PRIVATE HEALTH INSURANCE | Source: Ambulatory Visit | Attending: Primary Care | Admitting: Primary Care

## 2018-01-29 DIAGNOSIS — R739 Hyperglycemia, unspecified: Secondary | ICD-10-CM

## 2018-01-29 DIAGNOSIS — Z125 Encounter for screening for malignant neoplasm of prostate: Secondary | ICD-10-CM | POA: Insufficient documentation

## 2018-01-29 DIAGNOSIS — E7800 Pure hypercholesterolemia, unspecified: Secondary | ICD-10-CM | POA: Insufficient documentation

## 2018-01-29 DIAGNOSIS — I10 Essential (primary) hypertension: Secondary | ICD-10-CM | POA: Insufficient documentation

## 2018-01-29 LAB — COMPREHENSIVE METABOLIC PANEL
ALT: 28 U/L (ref 0–50)
AST: 24 U/L (ref 0–50)
Albumin: 5 g/dL (ref 3.5–5.2)
Alk Phos: 78 U/L (ref 40–130)
Anion Gap: 12 (ref 7–16)
Bilirubin,Total: 0.4 mg/dL (ref 0.0–1.2)
CO2: 26 mmol/L (ref 20–28)
Calcium: 9.5 mg/dL (ref 8.6–10.2)
Chloride: 104 mmol/L (ref 96–108)
Creatinine: 1.03 mg/dL (ref 0.67–1.17)
GFR,Black: 95 *
GFR,Caucasian: 82 *
Glucose: 118 mg/dL — ABNORMAL HIGH (ref 60–99)
Lab: 21 mg/dL — ABNORMAL HIGH (ref 6–20)
Potassium: 5.2 mmol/L — ABNORMAL HIGH (ref 3.3–5.1)
Sodium: 142 mmol/L (ref 133–145)
Total Protein: 7.1 g/dL (ref 6.3–7.7)

## 2018-01-29 LAB — LIPID PANEL
Chol/HDL Ratio: 4.5
Cholesterol: 135 mg/dL
HDL: 30 mg/dL
LDL Calculated: 67 mg/dL
Non HDL Cholesterol: 105 mg/dL
Triglycerides: 191 mg/dL — AB

## 2018-01-29 LAB — HEMOGLOBIN A1C: Hemoglobin A1C: 6 % — ABNORMAL HIGH

## 2018-01-29 LAB — PSA (EFF.4-2010): PSA (eff. 4-2010): 1.2 ng/mL (ref 0.00–4.00)

## 2018-02-01 ENCOUNTER — Ambulatory Visit: Payer: PRIVATE HEALTH INSURANCE | Attending: Primary Care | Admitting: Primary Care

## 2018-02-01 ENCOUNTER — Encounter: Payer: Self-pay | Admitting: Primary Care

## 2018-02-01 VITALS — BP 108/64 | Ht 72.0 in | Wt 208.0 lb

## 2018-02-01 DIAGNOSIS — I2510 Atherosclerotic heart disease of native coronary artery without angina pectoris: Secondary | ICD-10-CM

## 2018-02-01 DIAGNOSIS — E7800 Pure hypercholesterolemia, unspecified: Secondary | ICD-10-CM

## 2018-02-01 DIAGNOSIS — I10 Essential (primary) hypertension: Secondary | ICD-10-CM

## 2018-02-01 DIAGNOSIS — R739 Hyperglycemia, unspecified: Secondary | ICD-10-CM

## 2018-02-01 NOTE — Progress Notes (Signed)
S:  [name] returns to the office for follow up of his hypertension, hypercholesterolemia, CAD and hyperglycemia.    He continues with his usual medications including metoprolol, lisinopril, amlodipine, atorvastatin, Cymbalta, Lexapro, omeprazole, gabapentin, cyclobenzaprine, co-Q10, baby aspirin, fluticasone nasal spray and Nitrostat.  He denies any problems with headaches, dizziness, vision changes, chest pain, palpitations, shortness of breath, heartburn, nausea, diarrhea, bleeding problems or fatigue.  He is doing his best to watch his eating habits and stay active although he does not have a regular exercise routine.  He is not in the habit of checking his blood pressures at home.    He is planning to initiate Colace due to recent problems with constipation.    O:  General: Alert, appropriate, pleasant man in no apparent distress.  Neck: Supple, no lymphadenopathy, no thyromegaly, 2+ carotid pulses bilaterally.  Heart: Regular rate and rhythm, no murmur.  Lungs: Clear to auscultation bilaterally.  Abdomen: Positive bowel sounds, soft, nontender, nondistended, no masses, no organomegaly.  Extremities: 2+ radial and 2+ posterior tibial pulses bilaterally.  No clubbing, cyanosis or edema.    Hospital Outpatient Visit on 01/29/2018   Component Date Value Ref Range Status    Cholesterol 01/29/2018 135  mg/dL Final    Comment: REFERENCE RANGE:  < 200 Desirable                  200-239 Borderline High                    > 240 High      Triglycerides 01/29/2018 191* mg/dL Final    Comment: REFERENCE RANGE:  < 150 Normal                  150-199 Borderline High                  200-499 High                    > 500 Very High      HDL 01/29/2018 30  mg/dL Final    Comment: REFERENCE RANGE:  < 40 Low                    > 60 High      LDL Calculated 01/29/2018 67  mg/dL Final    Comment: REFERENCE RANGE:  < 100 Optimal                  100-129 Near or above optimal                  130-159 Borderline High                   160-189 High                    > 189 Very High      Non HDL Cholesterol 01/29/2018 105  mg/dL Final    Target is 12m/dl above(or over) LDL goal    Chol/HDL Ratio 01/29/2018 4.5   Final    Sodium 01/29/2018 142  133 - 145 mmol/L Final    Potassium 01/29/2018 5.2* 3.3 - 5.1 mmol/L Final    Chloride 01/29/2018 104  96 - 108 mmol/L Final    CO2 01/29/2018 26  20 - 28 mmol/L Final    Anion Gap 01/29/2018 12  7 - 16 Final    UN 01/29/2018 21* 6 - 20 mg/dL Final  Creatinine 01/29/2018 1.03  0.67 - 1.17 mg/dL Final    GFR,Caucasian 01/29/2018 82  * Final    GFR,Black 01/29/2018 95  * Final    *UNITS=mL/min/1.73 square meters    Glucose 01/29/2018 118* 60 - 99 mg/dL Final    Comment: Reference Ranges apply only to FASTING samples.    ADA Guidelines Blood Sugar Levels for Diagnosing Diabetes & Pre-diabetes  Normal: < 100 mg/dL  Impaired Fasting Glucose (IFG): 100-125 mg/dL  Diabetes:  > 126 mg/dL on two different occasions      Calcium 01/29/2018 9.5  8.6 - 10.2 mg/dL Final    Total Protein 01/29/2018 7.1  6.3 - 7.7 g/dL Final    Albumin 01/29/2018 5.0  3.5 - 5.2 g/dL Final    Bilirubin,Total 01/29/2018 0.4  0.0 - 1.2 mg/dL Final    AST 01/29/2018 24  0 - 50 U/L Final    ALT 01/29/2018 28  0 - 50 U/L Final    Alk Phos 01/29/2018 78  40 - 130 U/L Final    Hemoglobin A1C 01/29/2018 6.0* % Final    Comment: Ref Range <=5.6  HbA1c values of 5.7-6.4% indicate an increased risk for developing  diabetes mellitus.  HbA1c values greater than or equal to 6.5% are diagnostic of  diabetes mellitus.  For diagnosis of diabetes in individuals without unequivocal  hyperglycemia, results should be confirmed by repeat testing.      PSA (eff. 09-2008) 01/29/2018 1.20  0.00 - 4.00 ng/mL Final    Comment: Total PSA, (performed by the [employer] 602  electrochemiluminescence ECLIA) results are not absolute for  the presence or absence of malignant disease and the values  obtained with different assay methods or  kits can not be used  interchangeably.         A:  1.  Essential hypertension -- well controlled  2.  Hypercholesterolemia -- satisfactory control  3.  CAD -- asymptomatic and hemodynamically stable  4.  Hyperglycemia -- well controlled    P:  1.  Continue with current medications at their current doses.  2.  Continue to maintain efforts on prudent diet, exercise and weight management.  3.  I recommended that the patient check with his health insurance to see if he is covered for the Shingrix immunization.  4.  Proceed with the Colace as planned.  5.  RTO in 6 months for follow up of the above problems or sooner if any other problems or concerns.  6.  Complete blood work prior to the next appointment to check an FLP, CMP and Hgb A1c.

## 2018-02-02 ENCOUNTER — Ambulatory Visit: Payer: PRIVATE HEALTH INSURANCE | Admitting: Rehabilitative and Restorative Service Providers"

## 2018-02-02 DIAGNOSIS — M7711 Lateral epicondylitis, right elbow: Secondary | ICD-10-CM

## 2018-02-02 NOTE — Progress Notes (Signed)
[facility] Orthopedic Hand / Upper Extremity Rehabilitation Treatment Note    Todays Date: 02/02/2018    Name: [name]  DOB: Apr 21, 1966  Referring Physician: Berenice Bouton, PA  Diagnosis:   1. Right lateral epicondylitis         Visit #: 3  Note: Patient had a difficult week as he was hospitalized for his heart this past week.    Subjective:  Pain Assessment: 2  Pt reports an increase in pain intensity and frequency since previous treatment session.   Note: Patient received blood work last week and he thinks it aggravated his arm       Objective:  ROM -  Right, Elbow joints are WNL's. Patient complains of some discomfort/achy pain along volar aspect of forearm.  Strength - Therapeutic Exercises per flowsheet  Function: - Improved  Education:  Updated HEP. Reviewed stretching and strengthening exercises and lifting techniques    Objective        Treatment:  Moist heat, Phonophoresis with 10% HC  1MHz    Assessment:   Patient is responding favorably.       Plan of Care:  Continue per Plan of care -  As written; Patient would benefit from skilled rehabilitation services to address the above impairments to restore functional capacity.    Thank you for referring this patient to [place], MS,PT,CHT    Time Calculation Minutes   Time based CPT codes 80   Service Based CPT codes    Unbilled time (rest, etc) 15       Total Treatment Time 60     Heat X   Stretching X   Wrist E/F 3/35   Wrist RD/UD 3/35   P/S 2/35   Grip #3/35   Bike 50min/3min   Juxisor X   Ball on stick 30 reps   Bike 67min/3min   [employer] 2kg/10   Phono X

## 2018-02-02 NOTE — Addendum Note (Signed)
Addended by: [name] on: 02/02/2018 12:07 PM     Modules accepted: Level of Service

## 2018-02-05 ENCOUNTER — Ambulatory Visit: Payer: PRIVATE HEALTH INSURANCE | Admitting: Rehabilitative and Restorative Service Providers"

## 2018-02-05 DIAGNOSIS — M7711 Lateral epicondylitis, right elbow: Secondary | ICD-10-CM

## 2018-02-06 NOTE — Progress Notes (Signed)
[facility] Orthopedic Hand / Upper Extremity Rehabilitation Treatment Note    Todays Date: 02/06/2018    Name: [name]  DOB: 25-May-1966  Referring Physician: Berenice Bouton, PA  Diagnosis:   1. Right lateral epicondylitis         Visit #: 4  Note: Patient had a difficult week as he was hospitalized for his heart this past week.    Subjective:  Pain Assessment: 2  Pt reports an improvement in ability to perform functional activities since prior treatment session.        Objective:  ROM -  Right, Elbow joints are WNL's. Patient complains of some discomfort/achy pain along volar aspect of forearm.  Strength - Therapeutic Exercises per flowsheet  Function: - Improved however function still limited by pain  Education:  Updated HEP. Reviewed stretching and strengthening exercises and lifting techniques    Objective        Treatment:  Moist heat, Phonophoresis with 10% HC  1MHz    Assessment:   Patient is responding favorably.       Plan of Care:  Continue per Plan of care -  As written; Patient would benefit from skilled rehabilitation services to address the above impairments to restore functional capacity.    Thank you for referring this patient to [place], MS,PT,CHT    Time Calculation Minutes   Time based CPT codes 78   Service Based CPT codes    Unbilled time (rest, etc) 15       Total Treatment Time 60     Heat X   Stretching X   Wrist E/F 3/35   Wrist RD/UD 3/35   P/S 2/35   Grip #3/35   Bike 69min/3min   Juxisor X   Ball on stick 30 reps   Bike 36min/3min   [employer] 2kg/10   Phono X

## 2018-02-08 ENCOUNTER — Ambulatory Visit: Payer: PRIVATE HEALTH INSURANCE | Admitting: Rehabilitative and Restorative Service Providers"

## 2018-02-08 ENCOUNTER — Other Ambulatory Visit: Payer: Self-pay | Admitting: Orthopedic Surgery

## 2018-02-08 ENCOUNTER — Encounter: Payer: Self-pay | Admitting: Orthopedic Surgery

## 2018-02-08 DIAGNOSIS — M25521 Pain in right elbow: Secondary | ICD-10-CM

## 2018-02-12 ENCOUNTER — Ambulatory Visit: Payer: PRIVATE HEALTH INSURANCE | Admitting: Rehabilitative and Restorative Service Providers"

## 2018-02-16 ENCOUNTER — Other Ambulatory Visit: Payer: Self-pay

## 2018-02-16 ENCOUNTER — Other Ambulatory Visit: Payer: Self-pay | Admitting: Primary Care

## 2018-02-16 DIAGNOSIS — F411 Generalized anxiety disorder: Secondary | ICD-10-CM

## 2018-02-16 MED ORDER — NITROGLYCERIN 0.4 MG SL SUBL *I*
0.4000 mg | SUBLINGUAL_TABLET | SUBLINGUAL | 5 refills | Status: AC | PRN
Start: 2018-02-16 — End: ?

## 2018-02-16 NOTE — Telephone Encounter (Signed)
Last OV 03/06/17 with Dr. Sydell Axon  Annual FU

## 2018-04-13 ENCOUNTER — Ambulatory Visit: Payer: PRIVATE HEALTH INSURANCE | Admitting: Cardiology

## 2018-06-25 ENCOUNTER — Telehealth: Payer: Self-pay | Admitting: Neurology

## 2018-06-25 NOTE — Telephone Encounter (Signed)
Request for medical records received from [place]. Sent to medical records.

## 2018-06-28 ENCOUNTER — Telehealth: Payer: Self-pay

## 2018-06-28 NOTE — Telephone Encounter (Signed)
Faxed all records to [facility]  Fax [phone]  Ph# [phone]

## 2018-08-02 ENCOUNTER — Other Ambulatory Visit: Payer: Self-pay | Admitting: Pain Medicine

## 2018-08-02 ENCOUNTER — Ambulatory Visit
Admission: RE | Admit: 2018-08-02 | Discharge: 2018-08-02 | Disposition: A | Discharge status: Home / Self Care | Payer: PRIVATE HEALTH INSURANCE | Source: Ambulatory Visit | Attending: Pain Medicine | Admitting: Pain Medicine

## 2018-08-02 ENCOUNTER — Ambulatory Visit: Payer: Self-pay | Admitting: Primary Care

## 2018-08-02 DIAGNOSIS — M545 Low back pain, unspecified: Secondary | ICD-10-CM

## 2019-01-25 ENCOUNTER — Encounter: Payer: Self-pay | Admitting: Cardiology

## 2019-01-25 DIAGNOSIS — G40909 Epilepsy, unspecified, not intractable, without status epilepticus: Secondary | ICD-10-CM

## 2019-01-25 DIAGNOSIS — I10 Essential (primary) hypertension: Secondary | ICD-10-CM

## 2019-01-25 DIAGNOSIS — E782 Mixed hyperlipidemia: Secondary | ICD-10-CM | POA: Insufficient documentation

## 2019-01-25 DIAGNOSIS — I2510 Atherosclerotic heart disease of native coronary artery without angina pectoris: Secondary | ICD-10-CM | POA: Insufficient documentation

## 2019-01-25 DIAGNOSIS — E785 Hyperlipidemia, unspecified: Secondary | ICD-10-CM

## 2019-01-25 DIAGNOSIS — I25118 Atherosclerotic heart disease of native coronary artery with other forms of angina pectoris: Secondary | ICD-10-CM

## 2019-01-25 DIAGNOSIS — K219 Gastro-esophageal reflux disease without esophagitis: Secondary | ICD-10-CM

## 2019-01-25 HISTORY — DX: Hyperlipidemia, unspecified: E78.5

## 2019-01-25 HISTORY — DX: Atherosclerotic heart disease of native coronary artery with other forms of angina pectoris: I25.118

## 2019-01-25 HISTORY — DX: Essential (primary) hypertension: I10

## 2019-01-25 HISTORY — DX: Epilepsy, unspecified, not intractable, without status epilepticus: G40.909

## 2019-01-25 HISTORY — DX: Gastro-esophageal reflux disease without esophagitis: K21.9

## 2019-01-26 ENCOUNTER — Telehealth: Payer: PRIVATE HEALTH INSURANCE | Admitting: Cardiology

## 2019-01-26 ENCOUNTER — Encounter: Payer: Self-pay | Admitting: Cardiology

## 2019-01-26 ENCOUNTER — Other Ambulatory Visit: Payer: Self-pay

## 2019-01-26 VITALS — BP 120/70 | HR 60 | Ht 71.0 in | Wt 195.0 lb

## 2019-01-26 DIAGNOSIS — I10 Essential (primary) hypertension: Secondary | ICD-10-CM | POA: Diagnosis not present

## 2019-01-26 DIAGNOSIS — G40909 Epilepsy, unspecified, not intractable, without status epilepticus: Secondary | ICD-10-CM | POA: Diagnosis not present

## 2019-01-26 DIAGNOSIS — I25118 Atherosclerotic heart disease of native coronary artery with other forms of angina pectoris: Secondary | ICD-10-CM

## 2019-01-26 DIAGNOSIS — E7800 Pure hypercholesterolemia, unspecified: Secondary | ICD-10-CM | POA: Diagnosis not present

## 2019-01-26 NOTE — Progress Notes (Signed)
Virtual Visit via Video Note   Subjective:   Thomas Wilkerson, male    DOB: 01-Jun-1966, 53 y.o.   MRN: [id number]   I connected with the patient on 01/26/19 by a video enabled telemedicine application and verified that I am speaking with the correct person using two identifiers.     I discussed the limitations of evaluation and management by telemedicine and the availability of in person appointments. The patient expressed understanding and agreed to proceed.   This visit type was conducted due to national recommendations for restrictions regarding the COVID-19 Pandemic (e.g. social distancing).  This format is felt to be most appropriate for this patient at this time.  All issues noted in this document were discussed and addressed.  No physical exam was performed (except for noted visual exam findings with Tele health visits).  The patient has consented to conduct a Tele health visit and understands insurance will be billed.     Chief complaint:  Coronary artery disease   HPI  53 y.o. Caucasian male with hypertension, hyperlipidemia, coronary artery disease, h/o cardiac arrest in 2012 likely from LAD STEMI, treated with primary PCI, h/o seizures, anxiety, depression, BPH, chronic back pain.   Patient is here for 6 month follow up visit. To summarize, patient had STEMI in 2012, and has had multiple caths (last in2017 at [facility]) and stress echos (last in 01/2018 in [place]), reportedly normal.  I do not have prior records available to me at this time.  Patient has been doing well.  He stays physically active performing a lot of yard work, including Medical illustrator, grows, transplanting bushes, cutting trees etc.  He also averages 10,000-12,000 steps every day.  He has very occasional episodes of chest pain, roughly twice a month, that are relieved with 1 nitroglycerin.  He checked his blood pressure 3 weeks ago, which was normal.  He is compliant with his medical therapy.  Past  Medical History:  Diagnosis Date  . Coronary artery disease with stable angina pectoris ([place]) 01/25/2019  . GERD (gastroesophageal reflux disease) 01/25/2019  . HLD (hyperlipidemia) 01/25/2019  . HTN (hypertension) 01/25/2019  . Seizure disorder ([place]) 01/25/2019     History reviewed. No pertinent surgical history.   Social History   Socioeconomic History  . Marital status: Married    Spouse name: Not on file  . Number of children: 3  . Years of education: Not on file  . Highest education level: Not on file  Occupational History  . Not on file  Social Needs  . Financial resource strain: Not on file  . Food insecurity    Worry: Not on file    Inability: Not on file  . Transportation needs    Medical: Not on file    Non-medical: Not on file  Tobacco Use  . Smoking status: Former Smoker    Packs/day: 1.00    Types: Cigarettes    Quit date: 2012    Years since quitting: 8.6  . Smokeless tobacco: Never Used  Substance and Sexual Activity  . Alcohol use: Yes    Comment: occ  . Drug use: Never  . Sexual activity: Not on file  Lifestyle  . Physical activity    Days per week: Not on file    Minutes per session: Not on file  . Stress: Not on file  Relationships  . Social Herbalist on phone: Not on file    Gets together: Not on file  Attends religious service: Not on file    Active member of club or organization: Not on file    Attends meetings of clubs or organizations: Not on file    Relationship status: Not on file  . Intimate partner violence    Fear of current or ex partner: Not on file    Emotionally abused: Not on file    Physically abused: Not on file    Forced sexual activity: Not on file  Other Topics Concern  . Not on file  Social History Narrative  . Not on file     Family History  Problem Relation Age of Onset  . Diabetes Mother   . Hypertension Mother      Current Outpatient Medications on File Prior to Visit  Medication Sig  Dispense Refill  . amLODipine (NORVASC) 5 MG tablet Take 5 mg by mouth daily.    [name] aspirin EC 81 MG tablet Take 81 mg by mouth daily.    [name] atorvastatin (LIPITOR) 40 MG tablet Take 40 mg by mouth daily.    . Coenzyme Q10 (CO Q10) 100 MG CAPS Take by mouth daily.    . cyclobenzaprine (FLEXERIL) 10 MG tablet Take 10 mg by mouth 3 (three) times daily as needed.    Mariane Baumgarten Calcium (STOOL SOFTENER PO) Take by mouth 2 (two) times daily.    . DULoxetine (CYMBALTA) 30 MG capsule Take 30 mg by mouth daily.    [name] escitalopram (LEXAPRO) 10 MG tablet Take 10 mg by mouth daily.    . fluticasone (FLONASE) 50 MCG/ACT nasal spray Place into both nostrils daily.    [name] gabapentin (NEURONTIN) 300 MG capsule Take 300 mg by mouth 3 (three) times daily.    [name] lisinopril (ZESTRIL) 5 MG tablet Take 5 mg by mouth daily.    . Omega-3 Fatty Acids (FISH OIL PO) Take by mouth daily.    [name] omeprazole (PRILOSEC) 20 MG capsule Take 20 mg by mouth 2 (two) times daily.     No current facility-administered medications on file prior to visit.     Cardiovascular studies: Records awaited   Recent labs: 06/22/2018: Glucose 119. BUN/Cr 271/0.9. eGFR normal. Na/K 141/4.6. H/H 15/44. MCV 95. Platelets 294. Chol 151, TG 102, HDL 39, LDL 112.  HbA1C 6.0%   Review of Systems  Constitution: Negative for decreased appetite, malaise/fatigue, weight gain and weight loss.  HENT: Negative for congestion.   Eyes: Negative for visual disturbance.  Cardiovascular: Positive for chest pain (Infrequent). Negative for dyspnea on exertion, leg swelling, palpitations and syncope.  Respiratory: Negative for cough.   Endocrine: Negative for cold intolerance.  Hematologic/Lymphatic: Does not bruise/bleed easily.  Skin: Negative for itching and rash.  Musculoskeletal: Negative for myalgias.  Gastrointestinal: Negative for abdominal pain, nausea and vomiting.  Genitourinary: Negative for dysuria.  Neurological: Negative for dizziness and  weakness.  Psychiatric/Behavioral: The patient is not nervous/anxious.   All other systems reviewed and are negative.        Vitals:   01/10/19 0928  BP: 120/70  Pulse: 60     Body mass index is 27.2 kg/m. Filed Weights   01/26/19 0920  Weight: 195 lb (88.5 kg)     Observation/findings during video visit   Objective:    Physical Exam  Constitutional: He is oriented to person, place, and time. He appears well-developed and well-nourished. No distress.  Pulmonary/Chest: Effort normal.  Neurological: He is alert and oriented to person, place, and time.  Psychiatric: He has  a normal mood and affect.  Nursing note and vitals reviewed.         Assessment & Recommendations:   53 y.o. Caucasian male with hypertension, hyperlipidemia, coronary artery disease, h/o cardiac arrest in 2012 likely from LAD STEMI, treated with primary PCI, h/o seizures, anxiety, depression, BPH, chronic back pain.   CAD: Stable with occasional angina (2 episodes/month). Continue aggressive medical therapy including aspirin, amlodipine 5 mg, lisinopril 5 mg. Check lipid panel.  If LDL still greater than 100, recommend increasing Lipitor to 80 mg daily.  Hypertension: Well-controlled.  Hyperlipidemia: As above.  In office follow-up with me in 6 months.  Nigel Mormon, MD [facility] Pager: [phone] Office: [phone] If no answer Cell [phone]

## 2019-02-04 ENCOUNTER — Telehealth: Payer: Self-pay

## 2019-02-04 ENCOUNTER — Other Ambulatory Visit: Payer: Self-pay

## 2019-02-04 DIAGNOSIS — E7800 Pure hypercholesterolemia, unspecified: Secondary | ICD-10-CM

## 2019-02-04 DIAGNOSIS — I25118 Atherosclerotic heart disease of native coronary artery with other forms of angina pectoris: Secondary | ICD-10-CM

## 2019-02-04 LAB — LIPID PANEL
Chol/HDL Ratio: 3.8 ratio (ref 0.0–5.0)
Cholesterol, Total: 150 mg/dL (ref 100–199)
HDL: 39 mg/dL — ABNORMAL LOW (ref >39)
LDL Calculated: 83 mg/dL (ref 0–99)
Triglycerides: 142 mg/dL (ref 0–149)
VLDL Cholesterol Cal: 28 mg/dL (ref 5–40)

## 2019-02-04 MED ORDER — EZETIMIBE 10 MG PO TABS: 10.0000 mg | ORAL_TABLET | Freq: Every day | ORAL | 0 refills | Status: DC

## 2019-02-04 NOTE — Progress Notes (Signed)
Pt agrees to try zetia, do you want me to send to his pharmacy?

## 2019-02-04 NOTE — Telephone Encounter (Signed)
Okay to start Zetia.

## 2019-03-03 ENCOUNTER — Other Ambulatory Visit: Payer: Self-pay | Admitting: Cardiology

## 2019-03-03 DIAGNOSIS — E7800 Pure hypercholesterolemia, unspecified: Secondary | ICD-10-CM

## 2019-03-03 DIAGNOSIS — I25118 Atherosclerotic heart disease of native coronary artery with other forms of angina pectoris: Secondary | ICD-10-CM

## 2019-03-11 DIAGNOSIS — Z23 Encounter for immunization: Secondary | ICD-10-CM | POA: Diagnosis not present

## 2019-03-21 DIAGNOSIS — E7849 Other hyperlipidemia: Secondary | ICD-10-CM | POA: Diagnosis not present

## 2019-03-28 MED FILL — ESCITALOPRAM 10 MG TABLET: 10 | 90 days supply | Qty: 90 | Fill #0

## 2019-03-28 MED FILL — AMLODIPINE BESYLATE 5 MG TA: 5 | 90 days supply | Qty: 90 | Fill #0

## 2019-03-28 MED FILL — CYCLOBENZAPRINE 10 MG TAB: 10 | 90 days supply | Qty: 270 | Fill #0

## 2019-03-28 MED FILL — ATORVASTATIN 40 MG TABLET: 40 | 90 days supply | Qty: 90 | Fill #0

## 2019-03-28 MED FILL — GABAPENTIN 300 MG CAPSULE: 300 | 90 days supply | Qty: 270 | Fill #0

## 2019-03-28 MED FILL — EZETIMIBE 10 MG TABS: 10 | 90 days supply | Qty: 90 | Fill #0

## 2019-03-28 MED FILL — METOPROLOL TARTRATE 25 MG T: 25 | 90 days supply | Qty: 180 | Fill #0

## 2019-03-28 MED FILL — OMEPRAZOLE 20 MG CAPSULE DR: 20 | 90 days supply | Qty: 180 | Fill #0

## 2019-03-28 MED FILL — LISINOPRIL 5 MG TAB: 5 | 90 days supply | Qty: 90 | Fill #0

## 2019-04-06 MED FILL — DULoxetine HCL 30 MG CPEP: 30 | 90 days supply | Qty: 90 | Fill #0

## 2019-06-03 DIAGNOSIS — E7849 Other hyperlipidemia: Secondary | ICD-10-CM | POA: Diagnosis not present

## 2019-06-28 MED FILL — DULoxetine HCL 30 MG CPEP: 30 | 90 days supply | Qty: 90 | Fill #1

## 2019-06-28 MED FILL — OMEPRAZOLE 20 MG CAPSULE DR: 20 | 90 days supply | Qty: 180 | Fill #1

## 2019-06-28 MED FILL — CYCLOBENZAPRINE 10 MG TAB: 10 | 90 days supply | Qty: 270 | Fill #1

## 2019-06-28 MED FILL — METOPROLOL TARTRATE 25 MG T: 25 | 90 days supply | Qty: 180 | Fill #0

## 2019-06-28 MED FILL — GABAPENTIN 300 MG CAPSULE: 300 | 90 days supply | Qty: 270 | Fill #1

## 2019-06-28 MED FILL — AMLODIPINE BESYLATE 5 MG TA: 5 | 90 days supply | Qty: 90 | Fill #1

## 2019-06-28 MED FILL — LISINOPRIL 5 MG TAB: 5 | 90 days supply | Qty: 90 | Fill #1

## 2019-06-28 MED FILL — ATORVASTATIN 40 MG TABLET: 40 | 90 days supply | Qty: 90 | Fill #1

## 2019-06-28 MED FILL — ESCITALOPRAM 10 MG TABLET: 10 | 90 days supply | Qty: 90 | Fill #1

## 2019-07-04 MED FILL — EZETIMIBE 10 MG TABS: 10 | 90 days supply | Qty: 90 | Fill #0

## 2019-07-05 DIAGNOSIS — I10 Essential (primary) hypertension: Secondary | ICD-10-CM | POA: Diagnosis not present

## 2019-07-05 DIAGNOSIS — F17210 Nicotine dependence, cigarettes, uncomplicated: Secondary | ICD-10-CM | POA: Diagnosis not present

## 2019-07-05 DIAGNOSIS — Z23 Encounter for immunization: Secondary | ICD-10-CM | POA: Diagnosis not present

## 2019-07-05 DIAGNOSIS — Z9109 Other allergy status, other than to drugs and biological substances: Secondary | ICD-10-CM | POA: Diagnosis not present

## 2019-07-05 DIAGNOSIS — S91311A Laceration without foreign body, right foot, initial encounter: Secondary | ICD-10-CM | POA: Diagnosis not present

## 2019-07-05 DIAGNOSIS — S9031XA Contusion of right foot, initial encounter: Secondary | ICD-10-CM | POA: Diagnosis not present

## 2019-07-05 DIAGNOSIS — M7989 Other specified soft tissue disorders: Secondary | ICD-10-CM | POA: Diagnosis not present

## 2019-07-05 DIAGNOSIS — G40909 Epilepsy, unspecified, not intractable, without status epilepticus: Secondary | ICD-10-CM | POA: Diagnosis not present

## 2019-07-05 DIAGNOSIS — Z91040 Latex allergy status: Secondary | ICD-10-CM | POA: Diagnosis not present

## 2019-07-05 DIAGNOSIS — I252 Old myocardial infarction: Secondary | ICD-10-CM | POA: Diagnosis not present

## 2019-07-05 DIAGNOSIS — J449 Chronic obstructive pulmonary disease, unspecified: Secondary | ICD-10-CM | POA: Diagnosis not present

## 2019-07-07 DIAGNOSIS — S99921D Unspecified injury of right foot, subsequent encounter: Secondary | ICD-10-CM | POA: Diagnosis not present

## 2019-07-11 DIAGNOSIS — S91311D Laceration without foreign body, right foot, subsequent encounter: Secondary | ICD-10-CM | POA: Diagnosis not present

## 2019-08-05 ENCOUNTER — Ambulatory Visit: Payer: PRIVATE HEALTH INSURANCE | Admitting: Cardiology

## 2019-08-08 ENCOUNTER — Ambulatory Visit: Payer: 59 | Admitting: Cardiology

## 2019-08-08 ENCOUNTER — Other Ambulatory Visit: Payer: Self-pay

## 2019-08-08 ENCOUNTER — Encounter: Payer: Self-pay | Admitting: Cardiology

## 2019-08-08 VITALS — BP 121/73 | HR 57 | Temp 98.7°F | Wt 206.0 lb

## 2019-08-08 DIAGNOSIS — I10 Essential (primary) hypertension: Secondary | ICD-10-CM

## 2019-08-08 DIAGNOSIS — E782 Mixed hyperlipidemia: Secondary | ICD-10-CM | POA: Diagnosis not present

## 2019-08-08 DIAGNOSIS — I25118 Atherosclerotic heart disease of native coronary artery with other forms of angina pectoris: Secondary | ICD-10-CM

## 2019-08-08 NOTE — Progress Notes (Signed)
Subjective:   Thomas Wilkerson, male    DOB: 09-10-1965, 54 y.o.   MRN: [id number]  Chief complaint:  Coronary artery disease   HPI  54 y.o. Caucasian male with hypertension, hyperlipidemia, coronary artery disease, h/o cardiac arrest in 2012 likely from LAD STEMI, treated with primary PCI, h/o seizures, anxiety, depression, BPH, chronic back pain.   Patient had STEMI in 2012, and has had multiple caths (last in2017 at [facility]) and stress echos (last in 01/2018 in [place]), reportedly normal.  Patient stays active with regular physical activity.  He recently cleared 12.3 step fell in his backyard after retrosternal.  He did not have a significant chest pain or shortness of breath with this level of activity.  Since his last visit with me 6 months ago, he has used nitroglycerin only 3 times for angina symptoms, with improvement.  He has unchanged stable mild exertional dyspnea.  Current Outpatient Medications on File Prior to Visit  Medication Sig Dispense Refill  . amLODipine (NORVASC) 5 MG tablet Take 5 mg by mouth daily.    [name] aspirin EC 81 MG tablet Take 81 mg by mouth daily.    [name] atorvastatin (LIPITOR) 40 MG tablet Take 40 mg by mouth daily.    . Coenzyme Q10 (CO Q10) 100 MG CAPS Take by mouth daily.    . cyclobenzaprine (FLEXERIL) 10 MG tablet Take 10 mg by mouth 3 (three) times daily as needed.    Mariane Baumgarten Calcium (STOOL SOFTENER PO) Take by mouth 2 (two) times daily.    . DULoxetine (CYMBALTA) 30 MG capsule Take 30 mg by mouth daily.    [name] escitalopram (LEXAPRO) 10 MG tablet Take 10 mg by mouth daily.    [name] ezetimibe (ZETIA) 10 MG tablet Take 1 tablet by mouth once daily 90 tablet 0  . fluticasone (FLONASE) 50 MCG/ACT nasal spray Place into both nostrils daily.    [name] gabapentin (NEURONTIN) 300 MG capsule Take 300 mg by mouth 3 (three) times daily.    [name] lisinopril (ZESTRIL) 5 MG tablet Take 5 mg by mouth daily.    . metoprolol tartrate (LOPRESSOR) 25 MG tablet Take 25 mg by  mouth 2 (two) times daily.    . nitroGLYCERIN (NITROSTAT) 0.4 MG SL tablet Place 0.4 mg under the tongue every 5 (five) minutes as needed for chest pain.    . Omega-3 Fatty Acids (FISH OIL PO) Take by mouth daily.    [name] omeprazole (PRILOSEC) 20 MG capsule Take 20 mg by mouth 2 (two) times daily.     No current facility-administered medications on file prior to visit.    Cardiovascular studies: EKG 08/08/2019: Sinus rhythm 52 bpm.  Normal EKG.  Recent labs: 02/03/2019: Chol 150, TG 142, HDL 39, LDL 83  06/22/2018: Glucose 119. BUN/Cr 271/0.9. eGFR normal. Na/K 141/4.6. H/H 15/44. MCV 95. Platelets 294. Chol 151, TG 102, HDL 39, LDL 112.  HbA1C 6.0%   Review of Systems  Cardiovascular: Positive for chest pain. Negative for dyspnea on exertion, leg swelling, palpitations and syncope.       Vitals:   08/08/19 1036  BP: 121/73  Pulse: (!) 57  Temp: 98.7 F (37.1 C)  SpO2: 97%     Body mass index is 28.73 kg/m. Filed Weights   08/08/19 1036  Weight: 206 lb (93.4 kg)      Objective:    Physical Exam  Constitutional: He appears well-developed and well-nourished.  Neck: No JVD present.  Cardiovascular: Normal rate,  regular rhythm, normal heart sounds and intact distal pulses.  No murmur heard. Pulmonary/Chest: Effort normal and breath sounds normal. He has no wheezes. He has no rales.  Musculoskeletal:        General: No edema.  Nursing note and vitals reviewed.         Assessment & Recommendations:   54 y.o. Caucasian male with hypertension, hyperlipidemia, coronary artery disease, h/o cardiac arrest in 2012 likely from LAD STEMI, treated with primary PCI, h/o seizures, anxiety, depression, BPH, chronic back pain.   CAD: Stable with occasional angina (3 episodes in last 6 months). Continue aggressive medical therapy including aspirin, amlodipine 5 mg, lisinopril 5 mg, lipitor 80 mg daily, Zetia 10 mg daily.  We will check lipid panel today.  If LDL still  greater than 70, will consider adding PCSK9 inhibitor.  Hypertension: Well-controlled.  Hyperlipidemia: As above.  Follow-up in 6 months.  Nigel Mormon, MD [facility] Pager: [phone] Office: [phone] If no answer Cell [phone]

## 2019-08-09 ENCOUNTER — Other Ambulatory Visit (HOSPITAL_COMMUNITY): Payer: Self-pay | Admitting: Cardiology

## 2019-08-09 DIAGNOSIS — I25118 Atherosclerotic heart disease of native coronary artery with other forms of angina pectoris: Secondary | ICD-10-CM | POA: Diagnosis not present

## 2019-08-10 LAB — LIPID PANEL
Chol/HDL Ratio: 2.7 ratio (ref 0.0–5.0)
Cholesterol, Total: 110 mg/dL (ref 100–199)
HDL: 41 mg/dL (ref >39)
LDL Chol Calc (NIH): 54 mg/dL (ref 0–99)
Triglycerides: 73 mg/dL (ref 0–149)
VLDL Cholesterol Cal: 15 mg/dL (ref 5–40)

## 2019-08-12 NOTE — Progress Notes (Signed)
LDL is 54, which is great!. DO not need any other cholesterol medications at this time.   Thanks MJP

## 2019-08-15 NOTE — Progress Notes (Signed)
Called pt to inform him about is lab results. Pt understood

## 2019-09-06 ENCOUNTER — Telehealth: Payer: Self-pay | Admitting: Primary Care

## 2019-09-06 NOTE — Telephone Encounter (Signed)
SPOKE WITH STACEY. SHE AND Ved HAVE MOVED OUT OF STATE. THEY ARE NO LONGER PATIENT'S OF DR. EMERSON'S.DA

## 2019-09-14 DIAGNOSIS — G4733 Obstructive sleep apnea (adult) (pediatric): Secondary | ICD-10-CM | POA: Diagnosis not present

## 2019-09-14 DIAGNOSIS — G40909 Epilepsy, unspecified, not intractable, without status epilepticus: Secondary | ICD-10-CM | POA: Diagnosis not present

## 2019-09-14 DIAGNOSIS — R7309 Other abnormal glucose: Secondary | ICD-10-CM | POA: Diagnosis not present

## 2019-09-14 DIAGNOSIS — I10 Essential (primary) hypertension: Secondary | ICD-10-CM | POA: Diagnosis not present

## 2019-09-14 DIAGNOSIS — E782 Mixed hyperlipidemia: Secondary | ICD-10-CM | POA: Diagnosis not present

## 2019-09-14 DIAGNOSIS — I25118 Atherosclerotic heart disease of native coronary artery with other forms of angina pectoris: Secondary | ICD-10-CM | POA: Diagnosis not present

## 2019-09-14 DIAGNOSIS — G894 Chronic pain syndrome: Secondary | ICD-10-CM | POA: Diagnosis not present

## 2019-09-14 DIAGNOSIS — F419 Anxiety disorder, unspecified: Secondary | ICD-10-CM | POA: Diagnosis not present

## 2019-09-26 MED FILL — DULoxetine HCL 30 MG CPEP: 30 | 90 days supply | Qty: 90 | Fill #2

## 2019-09-26 MED FILL — CYCLOBENZAPRINE HCL 10 MG T: 10 | 90 days supply | Qty: 270 | Fill #2

## 2019-09-26 MED FILL — GABAPENTIN 300 MG CAPSULE: 300 | 90 days supply | Qty: 270 | Fill #2

## 2019-09-26 MED FILL — ATORVASTATIN 40 MG TABLET: 40 | 90 days supply | Qty: 90 | Fill #2

## 2019-09-26 MED FILL — EZETIMIBE 10 MG TABS: 10 | 90 days supply | Qty: 90 | Fill #0

## 2019-09-26 MED FILL — ESCITALOPRAM 10 MG TABLET: 10 | 90 days supply | Qty: 90 | Fill #2

## 2019-09-26 MED FILL — AMLODIPINE BESYLATE 5 MG TA: 5 | 90 days supply | Qty: 90 | Fill #2

## 2019-09-26 MED FILL — LISINOPRIL 5 MG TABS: 5 | 90 days supply | Qty: 90 | Fill #2

## 2019-09-26 MED FILL — METOPROLOL TARTRATE 25 MG T: 25 | 90 days supply | Qty: 180 | Fill #1

## 2019-09-26 MED FILL — OMEPRAZOLE DR 20 MG CAPSULE: 20 | 90 days supply | Qty: 180 | Fill #2

## 2020-01-02 MED FILL — GABAPENTIN 300 MG CAPSULE: 300 | 90 days supply | Qty: 270 | Fill #3

## 2020-01-02 MED FILL — LISINOPRIL 5 MG TABS: 5 | 90 days supply | Qty: 90 | Fill #3

## 2020-01-02 MED FILL — OMEPRAZOLE DR 20 MG CAPSULE: 20 | 90 days supply | Qty: 180 | Fill #3

## 2020-01-02 MED FILL — METOPROLOL TARTRATE 25 MG T: 25 | 90 days supply | Qty: 180 | Fill #2

## 2020-01-02 MED FILL — ESCITALOPRAM 10 MG TABLET: 10 | 90 days supply | Qty: 90 | Fill #3

## 2020-01-02 MED FILL — ATORVASTATIN 40 MG TABLET: 40 | 90 days supply | Qty: 90 | Fill #3

## 2020-01-02 MED FILL — EZETIMIBE 10 MG TABS: 10 | 90 days supply | Qty: 90 | Fill #1

## 2020-01-02 MED FILL — DULoxetine HCL 30 MG CPEP: 30 | 90 days supply | Qty: 90 | Fill #3

## 2020-01-02 MED FILL — AMLODIPINE BESYLATE 5 MG TA: 5 | 90 days supply | Qty: 90 | Fill #3

## 2020-01-04 DIAGNOSIS — G4733 Obstructive sleep apnea (adult) (pediatric): Secondary | ICD-10-CM | POA: Diagnosis not present

## 2020-01-30 ENCOUNTER — Other Ambulatory Visit: Payer: Self-pay

## 2020-01-30 ENCOUNTER — Encounter: Payer: Self-pay | Admitting: Cardiology

## 2020-01-30 ENCOUNTER — Ambulatory Visit: Payer: 59 | Admitting: Cardiology

## 2020-01-30 ENCOUNTER — Ambulatory Visit: Payer: 59

## 2020-01-30 VITALS — BP 133/79 | HR 63 | Resp 17 | Ht 71.0 in | Wt 207.0 lb

## 2020-01-30 DIAGNOSIS — R002 Palpitations: Secondary | ICD-10-CM | POA: Insufficient documentation

## 2020-01-30 DIAGNOSIS — R000 Tachycardia, unspecified: Secondary | ICD-10-CM | POA: Insufficient documentation

## 2020-01-30 DIAGNOSIS — I10 Essential (primary) hypertension: Secondary | ICD-10-CM

## 2020-01-30 DIAGNOSIS — E782 Mixed hyperlipidemia: Secondary | ICD-10-CM | POA: Diagnosis not present

## 2020-01-30 DIAGNOSIS — I25118 Atherosclerotic heart disease of native coronary artery with other forms of angina pectoris: Secondary | ICD-10-CM

## 2020-01-30 NOTE — Progress Notes (Signed)
Subjective:   Thomas Wilkerson, male    DOB: [birth year], 54 y.o.   MRN: [id number]  Chief complaint:  Coronary artery disease   HPI  54 y.o. Caucasian male with hypertension, hyperlipidemia, coronary artery disease, h/o cardiac arrest in 2012 likely from LAD STEMI, treated with primary PCI, h/o seizures, anxiety, depression, BPH, chronic back pain.   Patient had STEMI in 2012, and has had multiple caths (last in2017 at [facility]) and stress echos (last in 01/2018 in [place]), reportedly normal.  Patient has increasing frequency of chest pain in the last few weeks. Some of these episodes have also occurred at rest. He also reports episodes of palpitations.Some of these episodes are associated with chest pain.   Current Outpatient Medications on File Prior to Visit  Medication Sig Dispense Refill  . amLODipine (NORVASC) 5 MG tablet Take 5 mg by mouth daily.    [name] aspirin EC 81 MG tablet Take 81 mg by mouth daily.    [name] atorvastatin (LIPITOR) 40 MG tablet Take 40 mg by mouth daily.    . Coenzyme Q10 (CO Q10) 100 MG CAPS Take by mouth daily.    . cyclobenzaprine (FLEXERIL) 10 MG tablet Take 10 mg by mouth 3 (three) times daily as needed.    Mariane Baumgarten Calcium (STOOL SOFTENER PO) Take by mouth 2 (two) times daily.    . DULoxetine (CYMBALTA) 30 MG capsule Take 30 mg by mouth daily.    [name] escitalopram (LEXAPRO) 10 MG tablet Take 10 mg by mouth daily.    [name] ezetimibe (ZETIA) 10 MG tablet Take 1 tablet by mouth once daily 90 tablet 0  . fluticasone (FLONASE) 50 MCG/ACT nasal spray Place into both nostrils daily.    [name] gabapentin (NEURONTIN) 300 MG capsule Take 300 mg by mouth 3 (three) times daily.    [name] lisinopril (ZESTRIL) 5 MG tablet Take 5 mg by mouth daily.    . metoprolol tartrate (LOPRESSOR) 25 MG tablet Take 25 mg by mouth 2 (two) times daily.    . nitroGLYCERIN (NITROSTAT) 0.4 MG SL tablet Place 0.4 mg under the tongue every 5 (five) minutes as needed for chest pain.    . Omega-3 Fatty  Acids (FISH OIL PO) Take by mouth daily.    [name] omeprazole (PRILOSEC) 20 MG capsule Take 20 mg by mouth 2 (two) times daily.     No current facility-administered medications on file prior to visit.    Cardiovascular studies:  EKG 01/30/2020: Sinus rhythm 56 bpm Normal EKG  Recent labs: 08/09/2019: Chol 110, TG 73, HDL 41, LDL 54  09/14/2019: Glucose 128. BUN/Cr 18/0.8. eGFR normal.  HbA1C 6.1%  02/03/2019: Chol 150, TG 142, HDL 39, LDL 83  06/22/2018: Glucose 119. BUN/Cr 271/0.9. eGFR normal. Na/K 141/4.6. H/H 15/44. MCV 95. Platelets 294. Chol 151, TG 102, HDL 39, LDL 112.  HbA1C 6.0%   Review of Systems  Cardiovascular: Positive for chest pain. Negative for dyspnea on exertion, leg swelling, palpitations and syncope.       Vitals:   01/30/20 1107  BP: 133/79  Pulse: 63  Resp: 17  SpO2: 97%     Body mass index is 28.87 kg/m. Filed Weights   01/30/20 1107  Weight: 207 lb (93.9 kg)      Objective:    Physical Exam Vitals and nursing note reviewed.  Constitutional:      Appearance: He is well-developed.  Neck:     Vascular: No JVD.  Cardiovascular:  Rate and Rhythm: Normal rate and regular rhythm.     Pulses: Intact distal pulses.     Heart sounds: Normal heart sounds. No murmur heard.   Pulmonary:     Effort: Pulmonary effort is normal.     Breath sounds: Normal breath sounds. No wheezing or rales.           Assessment & Recommendations:   54 y.o. Caucasian male with hypertension, hyperlipidemia, coronary artery disease, h/o cardiac arrest in 2012 likely from LAD STEMI, treated with primary PCI, h/o seizures, anxiety, depression, BPH, chronic back pain.   CAD: Increasing frequency of angina episodes. Recommend exercise nuclear stress test. Continue aaspirin, amlodipine 5 mg, lisinopril 5 mg, lipitor 80 mg daily, Zetia 10 mg daily.    Palpitations: Daily symptoms. Will check 1 week cardiac telemetry, given suspected Afib.    Hypertension: Wellcontrolled.  Hyperlipidemia: Well controlled.  F/u after above tests.   Nigel Mormon, MD [facility] [place] Pager: [phone] Office: [phone] If no answer Cell [phone]

## 2020-02-01 DIAGNOSIS — R002 Palpitations: Secondary | ICD-10-CM | POA: Diagnosis not present

## 2020-02-06 ENCOUNTER — Ambulatory Visit: Payer: Self-pay | Admitting: Cardiology

## 2020-02-08 ENCOUNTER — Other Ambulatory Visit: Payer: Self-pay

## 2020-02-08 ENCOUNTER — Ambulatory Visit: Payer: 59

## 2020-02-08 DIAGNOSIS — I25118 Atherosclerotic heart disease of native coronary artery with other forms of angina pectoris: Secondary | ICD-10-CM | POA: Diagnosis not present

## 2020-02-15 ENCOUNTER — Other Ambulatory Visit: Payer: Self-pay

## 2020-02-15 ENCOUNTER — Encounter: Payer: Self-pay | Admitting: Cardiology

## 2020-02-15 ENCOUNTER — Ambulatory Visit: Payer: Self-pay | Admitting: Cardiology

## 2020-02-15 VITALS — BP 126/74 | HR 66 | Resp 17 | Ht 71.0 in | Wt 209.0 lb

## 2020-02-15 DIAGNOSIS — I25118 Atherosclerotic heart disease of native coronary artery with other forms of angina pectoris: Secondary | ICD-10-CM

## 2020-02-15 DIAGNOSIS — E782 Mixed hyperlipidemia: Secondary | ICD-10-CM

## 2020-02-15 DIAGNOSIS — I10 Essential (primary) hypertension: Secondary | ICD-10-CM

## 2020-02-15 MED ORDER — ISOSORBIDE MONONITRATE ER 30 MG PO TB24: 30.0000 mg | ORAL_TABLET | Freq: Every day | ORAL | 3 refills | Status: DC

## 2020-02-15 MED FILL — ISOSORBIDE MN ER 30 MG TAB: 30 | 90 days supply | Qty: 90 | Fill #0

## 2020-02-15 NOTE — Progress Notes (Signed)
Subjective:   Thomas Wilkerson, male    DOB: 1965/08/29, 54 y.o.   MRN: [id number]  Chief complaint:  Coronary artery disease   54 y.o. Caucasian male with hypertension, hyperlipidemia, coronary artery disease, h/o cardiac arrest in 2012 likely from LAD STEMI, treated with primary PCI, h/o seizures, anxiety, depression, BPH, chronic back pain.   Patient had STEMI in 2012, and has had multiple caths (last in 2017 at [facility]) and stress echos (last in 01/2018 in [place]), reportedly normal.  Patient was recently seen for recurrent episodes of palpitations and chest pain.  Your event monitor, which did not show any significant arrhythmias other than occasional PVCs.  Stress test did not show any significant ischemia.  Patient has had 1 episode of chest pain requiring nitroglycerin since last visit.   Current Outpatient Medications on File Prior to Visit  Medication Sig Dispense Refill  . amLODipine (NORVASC) 5 MG tablet Take 5 mg by mouth daily.    [name] aspirin EC 81 MG tablet Take 81 mg by mouth daily.    [name] atorvastatin (LIPITOR) 40 MG tablet Take 40 mg by mouth daily.    . Coenzyme Q10 (CO Q10) 100 MG CAPS Take by mouth daily.    . cyclobenzaprine (FLEXERIL) 10 MG tablet Take 10 mg by mouth 3 (three) times daily as needed.    Mariane Baumgarten Calcium (STOOL SOFTENER PO) Take by mouth 2 (two) times daily.    . DULoxetine (CYMBALTA) 30 MG capsule Take 30 mg by mouth daily.    [name] escitalopram (LEXAPRO) 10 MG tablet Take 10 mg by mouth daily.    [name] ezetimibe (ZETIA) 10 MG tablet Take 1 tablet by mouth once daily 90 tablet 0  . fluticasone (FLONASE) 50 MCG/ACT nasal spray Place into both nostrils daily.    [name] gabapentin (NEURONTIN) 300 MG capsule Take 300 mg by mouth 3 (three) times daily.    [name] lisinopril (ZESTRIL) 5 MG tablet Take 5 mg by mouth daily.    . metoprolol tartrate (LOPRESSOR) 25 MG tablet Take 25 mg by mouth 2 (two) times daily.    . nitroGLYCERIN (NITROSTAT) 0.4 MG SL tablet Place  0.4 mg under the tongue every 5 (five) minutes as needed for chest pain.    . Omega-3 Fatty Acids (FISH OIL PO) Take by mouth daily.    [name] omeprazole (PRILOSEC) 20 MG capsule Take 20 mg by mouth 2 (two) times daily.     No current facility-administered medications on file prior to visit.    Cardiovascular studies:  Walking/lexiscan Sestamibi stress test 02/08/2020: Exercise nuclear stress test was performed using Bruce protocol. After 9:00 test was converted to a walking lexiscan due to HR <85%. Patient reached 10.1 METS, and 64% of age predicted maximum heart rate. Exercise capacity was excellent. Chest pain not reported. Heart rate response attenuated due to beta blocker use. Normal hemodynamic response. Stress EKG at 64% MPHR revealed no ischemia changes.  Normal myocardial perfusion. Stress LVEF 43%, although visually appears normal.  Low risk study.   Mobile cardiac telemetry 4 days 01/30/2020 - 02/04/2020: Dominant rhythm: Sinus. HR 47-126 bpm. Avg HR 66 bpm. Rare VE/SVE, burden <1% No atrial fibrillation/atrial flutter/SVT/VT/high grade AV block, sinus pause >3sec noted. 17 patient triggered events, most associated with sinus rhythm/artifact, rare episodes associated with PVC.  EKG 01/30/2020: Sinus rhythm 56 bpm Normal EKG  Recent labs: 08/09/2019: Chol 110, TG 73, HDL 41, LDL 54  09/14/2019: Glucose 128. BUN/Cr 18/0.8. eGFR normal.  HbA1C 6.1%  02/03/2019: Chol 150, TG 142, HDL 39, LDL 83  06/22/2018: Glucose 119. BUN/Cr 271/0.9. eGFR normal. Na/K 141/4.6. H/H 15/44. MCV 95. Platelets 294. Chol 151, TG 102, HDL 39, LDL 112.  HbA1C 6.0%   Review of Systems  Cardiovascular: Positive for chest pain and palpitations. Negative for dyspnea on exertion, leg swelling and syncope.       Vitals:   02/15/20 0853  BP: 126/74  Pulse: 66  Resp: 17  SpO2: 97%     Body mass index is 29.15 kg/m. Filed Weights   02/15/20 0853  Weight: 209 lb (94.8 kg)      Objective:     Physical Exam Vitals and nursing note reviewed.  Constitutional:      Appearance: He is well-developed.  Neck:     Vascular: No JVD.  Cardiovascular:     Rate and Rhythm: Normal rate and regular rhythm.     Pulses: Intact distal pulses.     Heart sounds: Normal heart sounds. No murmur heard.   Pulmonary:     Effort: Pulmonary effort is normal.     Breath sounds: Normal breath sounds. No wheezing or rales.           Assessment & Recommendations:   54 y.o. Caucasian male with hypertension, hyperlipidemia, coronary artery disease, h/o cardiac arrest in 2012 likely from LAD STEMI, treated with primary PCI, h/o seizures, anxiety, depression, BPH, chronic back pain.   CAD: Recent increase in angina episodes without any ischemia on stress testing. Added Imdur 30 mg daily. Continue aspirin, amlodipine 5 mg, lisinopril 5 mg, lipitor 80 mg daily, Zetia 10 mg daily.   Follow-up in 3 months.  If he continues to have recurrent anginal symptoms in spite of optimal medical therapy, will consider repeat cardiac catheterization.  No indication at this time.  Palpitations: No significant arhythmia noted, although patient could wear the monitor only for 4 days.   Hypertension: Wellcontrolled.  Hyperlipidemia: Well controlled.  F/u in 3 months  [place], MD [facility] Pager: [phone] Office: [phone] If no answer Cell [phone]

## 2020-05-16 ENCOUNTER — Ambulatory Visit: Payer: PRIVATE HEALTH INSURANCE | Admitting: Cardiology

## 2020-05-16 ENCOUNTER — Other Ambulatory Visit: Payer: Self-pay

## 2020-05-16 ENCOUNTER — Encounter: Payer: Self-pay | Admitting: Cardiology

## 2020-05-16 VITALS — BP 121/73 | HR 66 | Resp 17 | Ht 71.0 in | Wt 214.0 lb

## 2020-05-16 DIAGNOSIS — I10 Essential (primary) hypertension: Secondary | ICD-10-CM

## 2020-05-16 DIAGNOSIS — I25110 Atherosclerotic heart disease of native coronary artery with unstable angina pectoris: Secondary | ICD-10-CM

## 2020-05-16 DIAGNOSIS — E782 Mixed hyperlipidemia: Secondary | ICD-10-CM

## 2020-05-16 NOTE — Progress Notes (Signed)
Subjective:   Thomas Wilkerson, male    DOB: 1965-12-21, 54 y.o.   MRN: [id number]  Chief complaint:  Coronary artery disease   54 y.o. Caucasian male with hypertension, hyperlipidemia, coronary artery disease, h/o cardiac arrest in 2012 likely from LAD STEMI, treated with primary PCI, h/o seizures, anxiety, depression, BPH, chronic back pain.   Patient had STEMI in 2012, and has had multiple caths (last in 2017 at [facility]). Recent increase in angina episodes without any ischemia on stress testing. At alst visit, I added Imdur 30 mg daily, continued aspirin, amlodipine 5 mg, lisinopril 5 mg, lipitor 80 mg daily, Zetia 10 mg daily, with pans for cardiac catheterization if his angina symptoms conitnued.  Since last visit, he has had at least two episodes of chest pain on exertion requiring SL NTG. He also had one episode of extreme shortness of breath during moving some furniture. Patient is very worried about these episodes.   Current Outpatient Medications on File Prior to Visit  Medication Sig Dispense Refill  . amLODipine (NORVASC) 5 MG tablet Take 5 mg by mouth daily.    [name] aspirin EC 81 MG tablet Take 81 mg by mouth daily.    [name] atorvastatin (LIPITOR) 40 MG tablet Take 40 mg by mouth daily.    . Coenzyme Q10 (CO Q10) 100 MG CAPS Take by mouth daily.    . cyclobenzaprine (FLEXERIL) 10 MG tablet Take 10 mg by mouth 3 (three) times daily as needed.    Mariane Baumgarten Calcium (STOOL SOFTENER PO) Take by mouth 2 (two) times daily.    . DULoxetine (CYMBALTA) 30 MG capsule Take 30 mg by mouth daily.    [name] escitalopram (LEXAPRO) 10 MG tablet Take 10 mg by mouth daily.    [name] ezetimibe (ZETIA) 10 MG tablet Take 1 tablet by mouth once daily 90 tablet 0  . fluticasone (FLONASE) 50 MCG/ACT nasal spray Place into both nostrils daily.    [name] gabapentin (NEURONTIN) 300 MG capsule Take 300 mg by mouth 3 (three) times daily.    . isosorbide mononitrate (IMDUR) 30 MG 24 hr tablet Take 1 tablet (30 mg total)  by mouth daily. 90 tablet 3  . lisinopril (ZESTRIL) 5 MG tablet Take 5 mg by mouth daily.    . metoprolol tartrate (LOPRESSOR) 25 MG tablet Take 25 mg by mouth 2 (two) times daily.    . nitroGLYCERIN (NITROSTAT) 0.4 MG SL tablet Place 0.4 mg under the tongue every 5 (five) minutes as needed for chest pain.    . Omega-3 Fatty Acids (FISH OIL PO) Take by mouth daily.    [name] omeprazole (PRILOSEC) 20 MG capsule Take 20 mg by mouth 2 (two) times daily.     No current facility-administered medications on file prior to visit.    Cardiovascular studies:  EKG 05/16/2020: Sinus rhythm 62 bpm  Inferior infarct -age undetermined Baseline artifact  Walking/lexiscan Sestamibi stress test 02/08/2020: Exercise nuclear stress test was performed using Bruce protocol. After 9:00 test was converted to a walking lexiscan due to HR <85%. Patient reached 10.1 METS, and 64% of age predicted maximum heart rate. Exercise capacity was excellent. Chest pain not reported. Heart rate response attenuated due to beta blocker use. Normal hemodynamic response. Stress EKG at 64% MPHR revealed no ischemia changes.  Normal myocardial perfusion. Stress LVEF 43%, although visually appears normal.  Low risk study.   Mobile cardiac telemetry 4 days 01/30/2020 - 02/04/2020: Dominant rhythm: Sinus. HR 47-126 bpm. Avg  HR 66 bpm. Rare VE/SVE, burden <1% No atrial fibrillation/atrial flutter/SVT/VT/high grade AV block, sinus pause >3sec noted. 17 patient triggered events, most associated with sinus rhythm/artifact, rare episodes associated with PVC.  EKG 01/30/2020: Sinus rhythm 56 bpm Normal EKG  Recent labs: 08/09/2019: Chol 110, TG 73, HDL 41, LDL 54  09/14/2019: Glucose 128. BUN/Cr 18/0.8. eGFR normal.  HbA1C 6.1%  02/03/2019: Chol 150, TG 142, HDL 39, LDL 83  06/22/2018: Glucose 119. BUN/Cr 271/0.9. eGFR normal. Na/K 141/4.6. H/H 15/44. MCV 95. Platelets 294. Chol 151, TG 102, HDL 39, LDL 112.  HbA1C 6.0%   Review  of Systems  Cardiovascular: Positive for chest pain and palpitations. Negative for dyspnea on exertion, leg swelling and syncope.       Vitals:   05/16/20 0946  BP: 121/73  Pulse: 66  Resp: 17  SpO2: 95%     Body mass index is 29.85 kg/m. Filed Weights   05/16/20 0946  Weight: 214 lb (97.1 kg)      Objective:    Physical Exam Vitals and nursing note reviewed.  Constitutional:      Appearance: He is well-developed.  Neck:     Vascular: No JVD.  Cardiovascular:     Rate and Rhythm: Normal rate and regular rhythm.     Pulses: Intact distal pulses.     Heart sounds: Normal heart sounds. No murmur heard.   Pulmonary:     Effort: Pulmonary effort is normal.     Breath sounds: Normal breath sounds. No wheezing or rales.           Assessment & Recommendations:   54 y.o. Caucasian male with hypertension, hyperlipidemia, coronary artery disease, h/o cardiac arrest in 2012 likely from LAD STEMI, treated with primary PCI, h/o seizures, anxiety, depression, BPH, chronic back pain.   CAD: Negative stress test in 01/2020. However, he continues to have symptoms which are concerning for unstable angina.  Reasonable to proceed with coronary angiogram and possible intervention. Of note, he is not willing to increase any anti-anginal therapy at this time and prefers intervention for obstructive CAD. Continue aspirin, amlodipine 5 mg, lisinopril 5 mg, Imdur 30 mg daily, lipitor 80 mg daily, Zetia 10 mg daily.    Hypertension: Wellcontrolled.  Mixed hyperlipidemia: Well controlled.  F/u in after cath  Nigel Mormon, MD [facility] [place] Pager: [phone] Office: [phone] If no answer Cell [phone]

## 2020-05-19 ENCOUNTER — Other Ambulatory Visit (HOSPITAL_COMMUNITY)
Admission: RE | Admit: 2020-05-19 | Discharge: 2020-05-19 | Disposition: A | Discharge status: Home / Self Care | Payer: PRIVATE HEALTH INSURANCE | Source: Ambulatory Visit | Attending: Cardiology | Admitting: Cardiology

## 2020-05-19 DIAGNOSIS — Z20822 Contact with and (suspected) exposure to covid-19: Secondary | ICD-10-CM | POA: Diagnosis not present

## 2020-05-19 DIAGNOSIS — Z01812 Encounter for preprocedural laboratory examination: Secondary | ICD-10-CM | POA: Insufficient documentation

## 2020-05-19 LAB — CBC
Hematocrit: 46 % (ref 37.5–51.0)
Hemoglobin: 15.5 g/dL (ref 13.0–17.7)
MCH: 32.6 pg (ref 26.6–33.0)
MCHC: 33.7 g/dL (ref 31.5–35.7)
MCV: 97 fL (ref 79–97)
Platelets: 329 x10E3/uL (ref 150–450)
RBC: 4.75 x10E6/uL (ref 4.14–5.80)
RDW: 12.4 % (ref 11.6–15.4)
WBC: 6.7 x10E3/uL (ref 3.4–10.8)

## 2020-05-19 LAB — BASIC METABOLIC PANEL
BUN/Creatinine Ratio: 14 (ref 9–20)
BUN: 15 mg/dL (ref 6–24)
CO2: 23 mmol/L (ref 20–29)
Calcium: 9.7 mg/dL (ref 8.7–10.2)
Chloride: 103 mmol/L (ref 96–106)
Creatinine, Ser: 1.08 mg/dL (ref 0.76–1.27)
GFR calc Af Amer: 89 mL/min/{1.73_m2} (ref >59)
GFR calc non Af Amer: 77 mL/min/{1.73_m2} (ref >59)
Glucose: 121 mg/dL — ABNORMAL HIGH (ref 65–99)
Potassium: 4.9 mmol/L (ref 3.5–5.2)
Sodium: 141 mmol/L (ref 134–144)

## 2020-05-20 LAB — SARS CORONAVIRUS 2 (TAT 6-24 HRS): SARS Coronavirus 2: NEGATIVE

## 2020-05-22 ENCOUNTER — Other Ambulatory Visit: Payer: Self-pay

## 2020-05-22 ENCOUNTER — Ambulatory Visit (HOSPITAL_COMMUNITY)
Admission: RE | Admit: 2020-05-22 | Discharge: 2020-05-22 | Disposition: A | Discharge status: Home / Self Care | Payer: PRIVATE HEALTH INSURANCE | Attending: Cardiology | Admitting: Cardiology

## 2020-05-22 ENCOUNTER — Encounter (HOSPITAL_COMMUNITY)
Admission: RE | Disposition: A | Discharge status: Home / Self Care | Payer: Self-pay | Source: Home / Self Care | Attending: Cardiology

## 2020-05-22 DIAGNOSIS — Z79899 Other long term (current) drug therapy: Secondary | ICD-10-CM | POA: Insufficient documentation

## 2020-05-22 DIAGNOSIS — I2510 Atherosclerotic heart disease of native coronary artery without angina pectoris: Secondary | ICD-10-CM | POA: Diagnosis present

## 2020-05-22 DIAGNOSIS — I25110 Atherosclerotic heart disease of native coronary artery with unstable angina pectoris: Secondary | ICD-10-CM | POA: Insufficient documentation

## 2020-05-22 DIAGNOSIS — I209 Angina pectoris, unspecified: Secondary | ICD-10-CM

## 2020-05-22 DIAGNOSIS — I10 Essential (primary) hypertension: Secondary | ICD-10-CM | POA: Insufficient documentation

## 2020-05-22 DIAGNOSIS — Z7982 Long term (current) use of aspirin: Secondary | ICD-10-CM | POA: Diagnosis not present

## 2020-05-22 DIAGNOSIS — Z955 Presence of coronary angioplasty implant and graft: Secondary | ICD-10-CM | POA: Diagnosis not present

## 2020-05-22 DIAGNOSIS — I200 Unstable angina: Secondary | ICD-10-CM

## 2020-05-22 DIAGNOSIS — I25118 Atherosclerotic heart disease of native coronary artery with other forms of angina pectoris: Secondary | ICD-10-CM | POA: Diagnosis present

## 2020-05-22 DIAGNOSIS — I2081 Angina pectoris with coronary microvascular dysfunction: Secondary | ICD-10-CM

## 2020-05-22 DIAGNOSIS — E782 Mixed hyperlipidemia: Secondary | ICD-10-CM | POA: Insufficient documentation

## 2020-05-22 HISTORY — PX: LEFT HEART CATH AND CORONARY ANGIOGRAPHY: CATH118249

## 2020-05-22 SURGERY — LEFT HEART CATH AND CORONARY ANGIOGRAPHY
Anesthesia: LOCAL

## 2020-05-22 MED ORDER — SODIUM CHLORIDE 0.9% FLUSH: 3.0000 mL | Freq: Two times a day (BID) | INTRAVENOUS | Status: DC

## 2020-05-22 MED ORDER — SODIUM CHLORIDE 0.9 % IV SOLN: 250.0000 mL | INTRAVENOUS | Status: DC | PRN

## 2020-05-22 MED ORDER — MIDAZOLAM HCL 2 MG/2ML IJ SOLN
INTRAMUSCULAR | Status: AC
  Filled 2020-05-22: qty 2

## 2020-05-22 MED ORDER — ONDANSETRON HCL 4 MG/2ML IJ SOLN: 4.0000 mg | Freq: Four times a day (QID) | INTRAMUSCULAR | Status: DC | PRN

## 2020-05-22 MED ORDER — VERAPAMIL HCL 2.5 MG/ML IV SOLN
INTRAVENOUS | Status: AC
  Filled 2020-05-22: qty 2

## 2020-05-22 MED ORDER — SODIUM CHLORIDE 0.9% FLUSH: 3.0000 mL | INTRAVENOUS | Status: DC | PRN

## 2020-05-22 MED ORDER — LIDOCAINE HCL (PF) 1 % IJ SOLN
INTRAMUSCULAR | Status: AC
  Filled 2020-05-22: qty 30

## 2020-05-22 MED ORDER — MIDAZOLAM HCL 2 MG/2ML IJ SOLN
INTRAMUSCULAR | Status: DC | PRN
  Administered 2020-05-22: 1 mg via INTRAVENOUS

## 2020-05-22 MED ORDER — HEPARIN (PORCINE) IN NACL 1000-0.9 UT/500ML-% IV SOLN
INTRAVENOUS | Status: AC
  Filled 2020-05-22: qty 1000

## 2020-05-22 MED ORDER — FENTANYL CITRATE (PF) 100 MCG/2ML IJ SOLN
INTRAMUSCULAR | Status: DC | PRN
  Administered 2020-05-22: 50 ug via INTRAVENOUS

## 2020-05-22 MED ORDER — SODIUM CHLORIDE 0.9 % WEIGHT BASED INFUSION
3.0000 mL/kg/h | INTRAVENOUS | Status: AC
  Administered 2020-05-22: 3 mL/kg/h via INTRAVENOUS

## 2020-05-22 MED ORDER — LABETALOL HCL 5 MG/ML IV SOLN: 10.0000 mg | INTRAVENOUS | Status: DC | PRN

## 2020-05-22 MED ORDER — HEPARIN SODIUM (PORCINE) 1000 UNIT/ML IJ SOLN
INTRAMUSCULAR | Status: AC
  Filled 2020-05-22: qty 1

## 2020-05-22 MED ORDER — HEPARIN SODIUM (PORCINE) 1000 UNIT/ML IJ SOLN
INTRAMUSCULAR | Status: DC | PRN
  Administered 2020-05-22: 5000 [IU] via INTRAVENOUS

## 2020-05-22 MED ORDER — FENTANYL CITRATE (PF) 100 MCG/2ML IJ SOLN
INTRAMUSCULAR | Status: AC
  Filled 2020-05-22: qty 2

## 2020-05-22 MED ORDER — IOHEXOL 350 MG/ML SOLN
INTRAVENOUS | Status: DC | PRN
  Administered 2020-05-22: 60 mL

## 2020-05-22 MED ORDER — HYDRALAZINE HCL 20 MG/ML IJ SOLN: 10.0000 mg | INTRAMUSCULAR | Status: DC | PRN

## 2020-05-22 MED ORDER — SODIUM CHLORIDE 0.9 % IV SOLN: INTRAVENOUS | Status: DC

## 2020-05-22 MED ORDER — SODIUM CHLORIDE 0.9 % WEIGHT BASED INFUSION
1.0000 mL/kg/h | INTRAVENOUS | Status: DC
  Administered 2020-05-22: 1 mL/kg/h via INTRAVENOUS

## 2020-05-22 MED ORDER — ASPIRIN 81 MG PO CHEW: 81.0000 mg | CHEWABLE_TABLET | ORAL | Status: DC

## 2020-05-22 MED ORDER — HEPARIN (PORCINE) IN NACL 1000-0.9 UT/500ML-% IV SOLN
INTRAVENOUS | Status: DC | PRN
  Administered 2020-05-22 (×2): 500 mL

## 2020-05-22 MED ORDER — LIDOCAINE HCL (PF) 1 % IJ SOLN
INTRAMUSCULAR | Status: DC | PRN
  Administered 2020-05-22: 3 mL

## 2020-05-22 MED ORDER — VERAPAMIL HCL 2.5 MG/ML IV SOLN
INTRAVENOUS | Status: DC | PRN
  Administered 2020-05-22: 10 mL via INTRA_ARTERIAL

## 2020-05-22 MED ORDER — ACETAMINOPHEN 325 MG PO TABS: 650.0000 mg | ORAL_TABLET | ORAL | Status: DC | PRN

## 2020-05-22 SURGICAL SUPPLY — 11 items
CATH INFINITI 5FR ANG PIGTAIL (CATHETERS) ×2 IMPLANT
CATH OPTITORQUE TIG 4.0 5F (CATHETERS) ×2 IMPLANT
DEVICE RAD COMP TR BAND LRG (VASCULAR PRODUCTS) ×2 IMPLANT
GLIDESHEATH SLEND A-KIT 6F 22G (SHEATH) ×2 IMPLANT
GUIDEWIRE INQWIRE 1.5J.035X260 (WIRE) ×1 IMPLANT
INQWIRE 1.5J .035X260CM (WIRE) ×2
KIT HEART LEFT (KITS) ×2 IMPLANT
PACK CARDIAC CATHETERIZATION (CUSTOM PROCEDURE TRAY) ×2 IMPLANT
SYR MEDRAD MARK 7 150ML (SYRINGE) ×2 IMPLANT
TRANSDUCER W/STOPCOCK (MISCELLANEOUS) ×2 IMPLANT
TUBING CIL FLEX 10 FLL-RA (TUBING) ×2 IMPLANT

## 2020-05-22 NOTE — H&P (Signed)
OV 05/16/2020 copied for documentation      Subjective:   Thomas Wilkerson, male    DOB: Jun 17, 1965, 54 y.o.   MRN: [id number]  Chief complaint:  Coronary artery disease   54 y.o. Caucasian male with hypertension, hyperlipidemia, coronary artery disease, h/o cardiac arrest in 2012 likely from LAD STEMI, treated with primary PCI, h/o seizures, anxiety, depression, BPH, chronic back pain.   Patient had STEMI in 2012, and has had multiple caths (last in 2017 at [facility]). Recent increase in angina episodes without any ischemia on stress testing. At alst visit, I added Imdur 30 mg daily, continued aspirin, amlodipine 5 mg, lisinopril 5 mg, lipitor 80 mg daily, Zetia 10 mg daily, with pans for cardiac catheterization if his angina symptoms conitnued.  Since last visit, he has had at least two episodes of chest pain on exertion requiring SL NTG. He also had one episode of extreme shortness of breath during moving some furniture. Patient is very worried about these episodes.   No current facility-administered medications on file prior to encounter.   Current Outpatient Medications on File Prior to Encounter  Medication Sig Dispense Refill  . amLODipine (NORVASC) 5 MG tablet Take 5 mg by mouth daily.    [name] aspirin EC 81 MG tablet Take 81 mg by mouth daily.    [name] atorvastatin (LIPITOR) 40 MG tablet Take 40 mg by mouth daily.    . Coenzyme Q10 (CO Q10) 100 MG CAPS Take 100 mg by mouth daily.     . cyclobenzaprine (FLEXERIL) 10 MG tablet Take 10 mg by mouth 3 (three) times daily as needed for muscle spasms.     [name] docusate sodium (COLACE) 100 MG capsule Take 100 mg by mouth 2 (two) times daily.    . DULoxetine (CYMBALTA) 30 MG capsule Take 30 mg by mouth daily.    [name] escitalopram (LEXAPRO) 10 MG tablet Take 10 mg by mouth daily.    [name] ezetimibe (ZETIA) 10 MG tablet Take 1 tablet by mouth once daily (Patient taking differently: Take 10 mg by mouth daily. ) 90 tablet 0  . fluticasone (FLONASE) 50 MCG/ACT  nasal spray Place 2 sprays into both nostrils daily as needed for allergies.     [name] gabapentin (NEURONTIN) 300 MG capsule Take 300-600 mg by mouth See admin instructions. Take 300 mg in the morning and 600 mg at bedtime    . ibuprofen (ADVIL) 200 MG tablet Take 600-800 mg by mouth every 6 (six) hours as needed for moderate pain.    . isosorbide mononitrate (IMDUR) 30 MG 24 hr tablet Take 1 tablet (30 mg total) by mouth daily. 90 tablet 3  . lisinopril (ZESTRIL) 5 MG tablet Take 5 mg by mouth daily.    . Menthol, Topical Analgesic, (BENGAY EX) Apply 1 application topically daily as needed (back pain).    . metoprolol tartrate (LOPRESSOR) 25 MG tablet Take 25 mg by mouth 2 (two) times daily.    . nitroGLYCERIN (NITROSTAT) 0.4 MG SL tablet Place 0.4 mg under the tongue every 5 (five) minutes as needed for chest pain.    . Omega-3 Fatty Acids (FISH OIL) 1000 MG CAPS Take 1,000 mg by mouth daily.    [name] omeprazole (PRILOSEC) 20 MG capsule Take 20 mg by mouth 2 (two) times daily.    . Tetrahydrozoline HCl (VISINE OP) Place 1 drop into both eyes daily as needed (dry eyes).      Cardiovascular studies:  EKG 05/16/2020: Sinus rhythm 62 bpm  Inferior infarct -age undetermined Baseline artifact  Walking/lexiscan Sestamibi stress test 02/08/2020: Exercise nuclear stress test was performed using Bruce protocol. After 9:00 test was converted to a walking lexiscan due to HR <85%. Patient reached 10.1 METS, and 64% of age predicted maximum heart rate. Exercise capacity was excellent. Chest pain not reported. Heart rate response attenuated due to beta blocker use. Normal hemodynamic response. Stress EKG at 64% MPHR revealed no ischemia changes.  Normal myocardial perfusion. Stress LVEF 43%, although visually appears normal.  Low risk study.   Mobile cardiac telemetry 4 days 01/30/2020 - 02/04/2020: Dominant rhythm: Sinus. HR 47-126 bpm. Avg HR 66 bpm. Rare VE/SVE, burden <1% No atrial fibrillation/atrial  flutter/SVT/VT/high grade AV block, sinus pause >3sec noted. 17 patient triggered events, most associated with sinus rhythm/artifact, rare episodes associated with PVC.  EKG 01/30/2020: Sinus rhythm 56 bpm Normal EKG  Recent labs: 08/09/2019: Chol 110, TG 73, HDL 41, LDL 54  09/14/2019: Glucose 128. BUN/Cr 18/0.8. eGFR normal.  HbA1C 6.1%  02/03/2019: Chol 150, TG 142, HDL 39, LDL 83  06/22/2018: Glucose 119. BUN/Cr 271/0.9. eGFR normal. Na/K 141/4.6. H/H 15/44. MCV 95. Platelets 294. Chol 151, TG 102, HDL 39, LDL 112.  HbA1C 6.0%   Review of Systems  Cardiovascular: Positive for chest pain and palpitations. Negative for dyspnea on exertion, leg swelling and syncope.       There were no vitals filed for this visit.   There is no height or weight on file to calculate BMI. There were no vitals filed for this visit.    Objective:    Physical Exam Vitals and nursing note reviewed.  Constitutional:      Appearance: He is well-developed.  Neck:     Vascular: No JVD.  Cardiovascular:     Rate and Rhythm: Normal rate and regular rhythm.     Pulses: Intact distal pulses.     Heart sounds: Normal heart sounds. No murmur heard.   Pulmonary:     Effort: Pulmonary effort is normal.     Breath sounds: Normal breath sounds. No wheezing or rales.           Assessment & Recommendations:   54 y.o. Caucasian male with hypertension, hyperlipidemia, coronary artery disease, h/o cardiac arrest in 2012 likely from LAD STEMI, treated with primary PCI, h/o seizures, anxiety, depression, BPH, chronic back pain.   CAD: Negative stress test in 01/2020. However, he continues to have symptoms which are concerning for unstable angina.  Reasonable to proceed with coronary angiogram and possible intervention. Of note, he is not willing to increase any anti-anginal therapy at this time and prefers intervention for obstructive CAD. Continue aspirin, amlodipine 5 mg, lisinopril 5 mg,  Imdur 30 mg daily, lipitor 80 mg daily, Zetia 10 mg daily.    Hypertension: Wellcontrolled.  Mixed hyperlipidemia: Well controlled.  F/u in after cath  Nigel Mormon, MD [facility] [place] Pager: [phone] Office: [phone] If no answer Cell [phone]

## 2020-05-22 NOTE — Progress Notes (Signed)
Patient was given discharge instructions. He verbalized understanding. 

## 2020-05-22 NOTE — Discharge Instructions (Signed)
Drink plenty of fluid for 48 hours and keep wrist elevated at heart level for 24 hours  Radial Site Care   This sheet gives you information about how to care for yourself after your procedure. Your health care provider may also give you more specific instructions. If you have problems or questions, contact your health care provider. What can I expect after the procedure? After the procedure, it is common to have:  Bruising and tenderness at the catheter insertion area. Follow these instructions at home: Medicines  Take over-the-counter and prescription medicines only as told by your health care provider. Insertion site care 1. Follow instructions from your health care provider about how to take care of your insertion site. Make sure you: ? Wash your hands with soap and water before you change your bandage (dressing). If soap and water are not available, use hand sanitizer. ? remove your dressing as told by your health care provider. In 24 hours 2. Check your insertion site every day for signs of infection. Check for: ? Redness, swelling, or pain. ? Fluid or blood. ? Pus or a bad smell. ? Warmth. 3. Do not take baths, swim, or use a hot tub until your health care provider approves. 4. You may shower 24-48 hours after the procedure, or as directed by your health care provider. ? Remove the dressing and gently wash the site with plain soap and water. ? Pat the area dry with a clean towel. ? Do not rub the site. That could cause bleeding. 5. Do not apply powder or lotion to the site. Activity   1. For 24 hours after the procedure, or as directed by your health care provider: ? Do not flex or bend the affected arm. ? Do not push or pull heavy objects with the affected arm. ? Do not drive yourself home from the hospital or clinic. You may drive 24 hours after the procedure unless your health care provider tells you not to. ? Do not operate machinery or power tools. 2. Do not lift  anything that is heavier than 10 lb (4.5 kg), or the limit that you are told, until your health care provider says that it is safe. For 4 days 3. Ask your health care provider when it is okay to: ? Return to work or school. ? Resume usual physical activities or sports. ? Resume sexual activity. General instructions  If the catheter site starts to bleed, raise your arm and put firm pressure on the site. If the bleeding does not stop, get help right away. This is a medical emergency.  If you went home on the same day as your procedure, a responsible adult should be with you for the first 24 hours after you arrive home.  Keep all follow-up visits as told by your health care provider. This is important. Contact a health care provider if:  You have a fever.  You have redness, swelling, or yellow drainage around your insertion site. Get help right away if:  You have unusual pain at the radial site.  The catheter insertion area swells very fast.  The insertion area is bleeding, and the bleeding does not stop when you hold steady pressure on the area.  Your arm or hand becomes pale, cool, tingly, or numb. These symptoms may represent a serious problem that is an emergency. Do not wait to see if the symptoms will go away. Get medical help right away. Call your local emergency services (911 in the [place] Do not   drive yourself to the hospital. Summary  After the procedure, it is common to have bruising and tenderness at the site.  Follow instructions from your health care provider about how to take care of your radial site wound. Check the wound every day for signs of infection.  Do not lift anything that is heavier than 10 lb (4.5 kg), or the limit that you are told, until your health care provider says that it is safe. This information is not intended to replace advice given to you by your health care provider. Make sure you discuss any questions you have with your health care  provider. Document Revised: 07/08/2017 Document Reviewed: 07/08/2017 Elsevier Patient Education  2020 [employer]  

## 2020-05-22 NOTE — Interval H&P Note (Signed)
History and Physical Interval Note:  05/22/2020 12:55 PM  Finis Bud  has presented today for surgery, with the diagnosis of CAD.  The various methods of treatment have been discussed with the patient and family. After consideration of risks, benefits and other options for treatment, the patient has consented to  Procedure(s): LEFT HEART CATH AND CORONARY ANGIOGRAPHY (N/A) as a surgical intervention.  The patient's history has been reviewed, patient examined, no change in status, stable for surgery.  I have reviewed the patient's chart and labs.  Questions were answered to the patient's satisfaction.    2016 Appropriate Use Criteria for Coronary Revascularization in Patients With Acute Coronary Syndrome NSTEMI/UA Intermediate Risk (TIMI Score 3-4) NSTEMI/Unstable angina, stabilized patient at Intermediate Risk (TIMI Score 3-4) Link Here: [employer] Indication:  Revascularization by PCI or CABG of 1 or more arteries in a patient with NSTEMI or unstable angina with Stabilization after presentation Intermediate risk for clinical events  A (7) Indication: 16; Score 7   Idolina Mantell J Magdelyn Roebuck

## 2020-05-23 ENCOUNTER — Encounter (HOSPITAL_COMMUNITY): Payer: Self-pay | Admitting: Cardiology

## 2020-05-26 ENCOUNTER — Other Ambulatory Visit (HOSPITAL_COMMUNITY): Payer: Self-pay

## 2020-06-04 ENCOUNTER — Other Ambulatory Visit: Payer: Self-pay

## 2020-06-04 DIAGNOSIS — I25118 Atherosclerotic heart disease of native coronary artery with other forms of angina pectoris: Secondary | ICD-10-CM

## 2020-06-04 MED ORDER — AMLODIPINE BESYLATE 5 MG PO TABS
5.0000 mg | ORAL_TABLET | Freq: Every day | ORAL | 0 refills | Status: DC
Start: 2020-06-04 — End: 2022-12-10

## 2020-06-04 MED ORDER — ISOSORBIDE MONONITRATE ER 30 MG PO TB24: 30.0000 mg | ORAL_TABLET | Freq: Every day | ORAL | 3 refills | Status: DC

## 2020-08-12 NOTE — Progress Notes (Signed)
Subjective:   Thomas Wilkerson, male    DOB: 1966/03/02, 55 y.o.   MRN: [id number]  Chief complaint:  Coronary artery disease   55 y.o. Caucasian male with hypertension, hyperlipidemia, coronary artery disease, h/o cardiac arrest in 2012 likely from LAD STEMI, treated with primary PCI, h/o seizures, anxiety, depression, BPH, chronic back pain.   Patient has recently had severe back pain, has bene on prednisone for the same. Blood pressure is elevated today.   Current Outpatient Medications on File Prior to Visit  Medication Sig Dispense Refill  . amLODipine (NORVASC) 5 MG tablet Take 1 tablet (5 mg total) by mouth daily. 30 tablet 0  . aspirin EC 81 MG tablet Take 81 mg by mouth daily.    [name] atorvastatin (LIPITOR) 40 MG tablet Take 40 mg by mouth daily.    . Coenzyme Q10 (CO Q10) 100 MG CAPS Take 100 mg by mouth daily.     . cyclobenzaprine (FLEXERIL) 10 MG tablet Take 10 mg by mouth 3 (three) times daily as needed for muscle spasms.     [name] docusate sodium (COLACE) 100 MG capsule Take 100 mg by mouth 2 (two) times daily.    . DULoxetine (CYMBALTA) 30 MG capsule Take 30 mg by mouth daily.    [name] escitalopram (LEXAPRO) 10 MG tablet Take 10 mg by mouth daily.    [name] ezetimibe (ZETIA) 10 MG tablet Take 1 tablet by mouth once daily (Patient taking differently: Take 10 mg by mouth daily. ) 90 tablet 0  . fluticasone (FLONASE) 50 MCG/ACT nasal spray Place 2 sprays into both nostrils daily as needed for allergies.     [name] gabapentin (NEURONTIN) 300 MG capsule Take 300-600 mg by mouth See admin instructions. Take 300 mg in the morning and 600 mg at bedtime    . ibuprofen (ADVIL) 200 MG tablet Take 600-800 mg by mouth every 6 (six) hours as needed for moderate pain.    . isosorbide mononitrate (IMDUR) 30 MG 24 hr tablet Take 1 tablet (30 mg total) by mouth daily. 90 tablet 3  . lisinopril (ZESTRIL) 5 MG tablet Take 5 mg by mouth daily.    . Menthol, Topical Analgesic, (BENGAY EX) Apply 1 application  topically daily as needed (back pain).    . metoprolol tartrate (LOPRESSOR) 25 MG tablet Take 25 mg by mouth 2 (two) times daily.    . nitroGLYCERIN (NITROSTAT) 0.4 MG SL tablet Place 0.4 mg under the tongue every 5 (five) minutes as needed for chest pain.    . Omega-3 Fatty Acids (FISH OIL) 1000 MG CAPS Take 1,000 mg by mouth daily.    [name] omeprazole (PRILOSEC) 20 MG capsule Take 20 mg by mouth 2 (two) times daily.    . Tetrahydrozoline HCl (VISINE OP) Place 1 drop into both eyes daily as needed (dry eyes).     No current facility-administered medications on file prior to visit.    Cardiovascular studies:  EKG 08/13/2020: Sinus rhythm 67 bpm Normal EKG  Coronary angiography 05/22/2020: LM: Normal LAD: Patent pLAD stent with no instent-restenosis LCx: normal Ramus: Normal RCA: Normal Normal LVEF and LVEDP  Walking/lexiscan Sestamibi stress test 02/08/2020: Exercise nuclear stress test was performed using Bruce protocol. After 9:00 test was converted to a walking lexiscan due to HR <85%. Patient reached 10.1 METS, and 64% of age predicted maximum heart rate. Exercise capacity was excellent. Chest pain not reported. Heart rate response attenuated due to beta blocker use. Normal hemodynamic response. Stress  EKG at 64% MPHR revealed no ischemia changes.  Normal myocardial perfusion. Stress LVEF 43%, although visually appears normal.  Low risk study.  Mobile cardiac telemetry 4 days 01/30/2020 - 02/04/2020: Dominant rhythm: Sinus. HR 47-126 bpm. Avg HR 66 bpm. Rare VE/SVE, burden <1% No atrial fibrillation/atrial flutter/SVT/VT/high grade AV block, sinus pause >3sec noted. 17 patient triggered events, most associated with sinus rhythm/artifact, rare episodes associated with PVC.   Recent labs: 05/18/2020: Glucose 121, BUN/Cr 15/1.08. EGFR 77. Na/K 141/4.9. Rest of the CMP normal H/H 15/46. MCV 97. Platelets 329  08/09/2019: Chol 110, TG 73, HDL 41, LDL 54  09/14/2019: Glucose 128.  BUN/Cr 18/0.8. eGFR normal.  HbA1C 6.1%  02/03/2019: Chol 150, TG 142, HDL 39, LDL 83  06/22/2018: Glucose 119. BUN/Cr 271/0.9. eGFR normal. Na/K 141/4.6. H/H 15/44. MCV 95. Platelets 294. Chol 151, TG 102, HDL 39, LDL 112.  HbA1C 6.0%   Review of Systems  Cardiovascular: Positive for chest pain and palpitations. Negative for dyspnea on exertion, leg swelling and syncope.       Vitals:   08/13/20 0920  BP: (!) 151/80  Pulse: 74  SpO2: 95%     Body mass index is 29.85 kg/m. Filed Weights   08/13/20 0920  Weight: 214 lb (97.1 kg)      Objective:    Physical Exam Vitals and nursing note reviewed.  Constitutional:      Appearance: He is well-developed.  Neck:     Vascular: No JVD.  Cardiovascular:     Rate and Rhythm: Normal rate and regular rhythm.     Pulses: Intact distal pulses.     Heart sounds: Normal heart sounds. No murmur heard.   Pulmonary:     Effort: Pulmonary effort is normal.     Breath sounds: Normal breath sounds. No wheezing or rales.           Assessment & Recommendations:   55 y.o. Caucasian male with hypertension, hyperlipidemia, coronary artery disease, h/o cardiac arrest in 2012 likely from LAD STEMI, treated with primary PCI, h/o seizures, anxiety, depression, BPH, chronic back pain.   CAD: Patent LAD stent, no other significant CAD. (Cath 05/2020) Continue aspirin, amlodipine 5 mg, lisinopril 5 mg, Imdur 30 mg daily, lipitor 80 mg daily, Zetia 10 mg daily.    Hypertension: Elevated today, probably due to back pain and prednisone use. Continue f/u w/PCP for back pain and monitor BP. If remains elevated, could increase lisinopril to 10 mg daily.  Mixed hyperlipidemia: Well controlled.  F/u in 6 months  Maryalyce Sanjuan Esther Hardy, MD [facility] Pager: [phone] Office: [phone] If no answer Cell [phone]

## 2020-08-13 ENCOUNTER — Other Ambulatory Visit: Payer: Self-pay

## 2020-08-13 ENCOUNTER — Ambulatory Visit: Payer: 59 | Admitting: Cardiology

## 2020-08-13 ENCOUNTER — Encounter: Payer: Self-pay | Admitting: Cardiology

## 2020-08-13 VITALS — BP 151/80 | HR 74 | Ht 71.0 in | Wt 214.0 lb

## 2020-08-13 DIAGNOSIS — E782 Mixed hyperlipidemia: Secondary | ICD-10-CM

## 2020-08-13 DIAGNOSIS — I10 Essential (primary) hypertension: Secondary | ICD-10-CM

## 2020-08-13 DIAGNOSIS — I2510 Atherosclerotic heart disease of native coronary artery without angina pectoris: Secondary | ICD-10-CM

## 2020-08-13 DIAGNOSIS — R002 Palpitations: Secondary | ICD-10-CM

## 2020-08-15 ENCOUNTER — Ambulatory Visit: Payer: Self-pay | Admitting: Cardiology

## 2020-10-29 IMAGING — CR DG LUMBAR SPINE COMPLETE 4+V
5 series · 5 of 5 positions shown · non-contrast
Comparison: None.

CLINICAL DATA: 52-year-old male with chronic low back pain for
years. Pain extending to left leg. History of ablations. Initial
encounter.

EXAM:
LUMBAR SPINE - COMPLETE 4+ VIEW

[w lumbar spine ap]
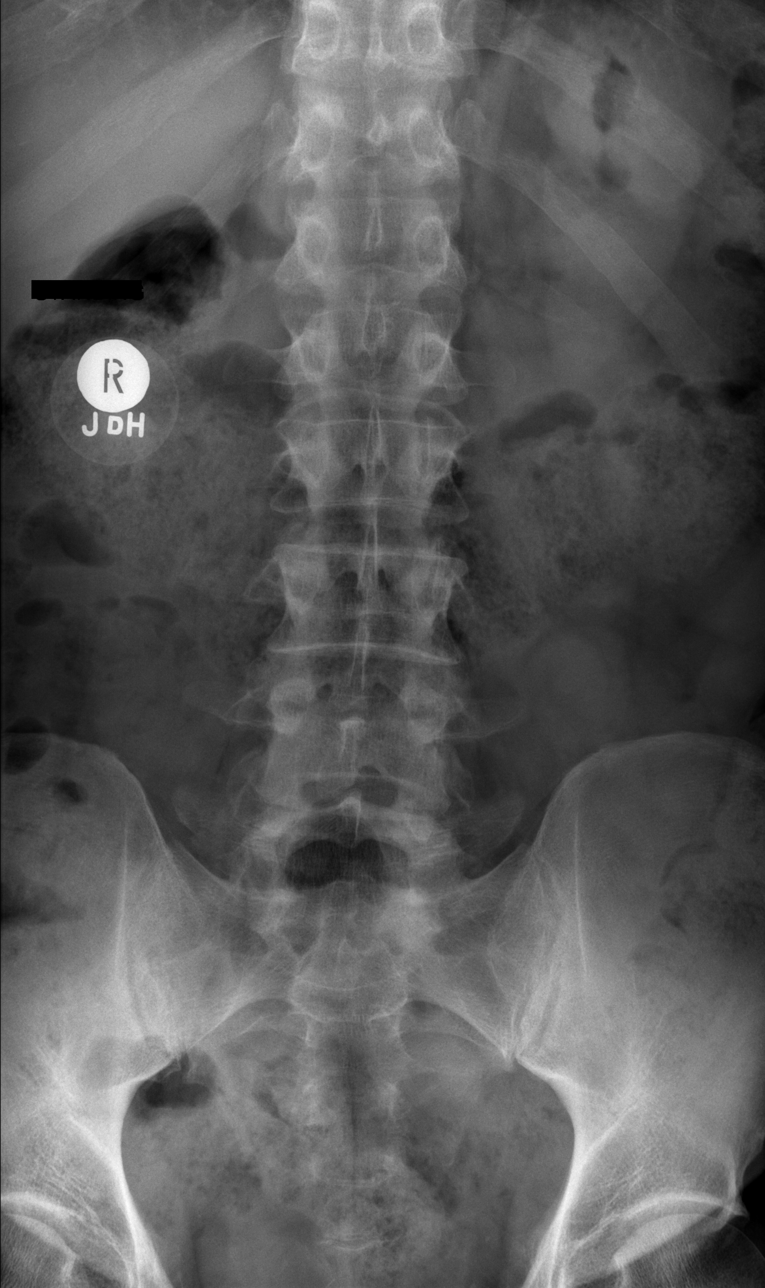

[w lumbar spine obl (1 of 2)]
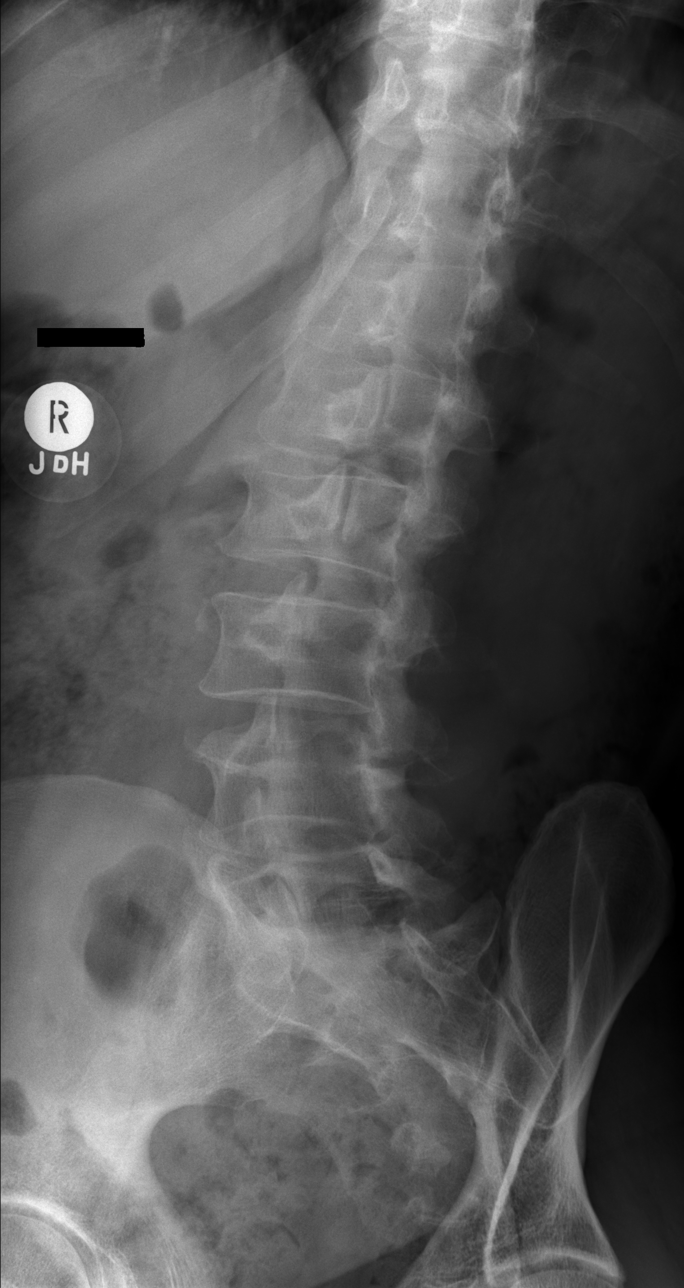

[w lumbar spine obl (2 of 2)]
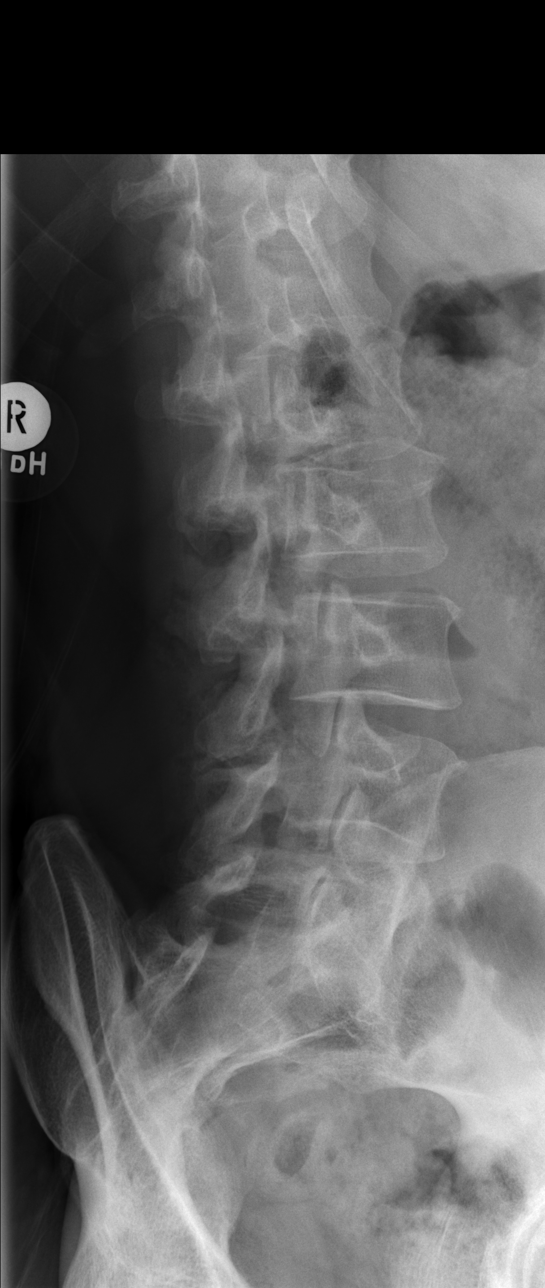

[w lumbar spine lat]
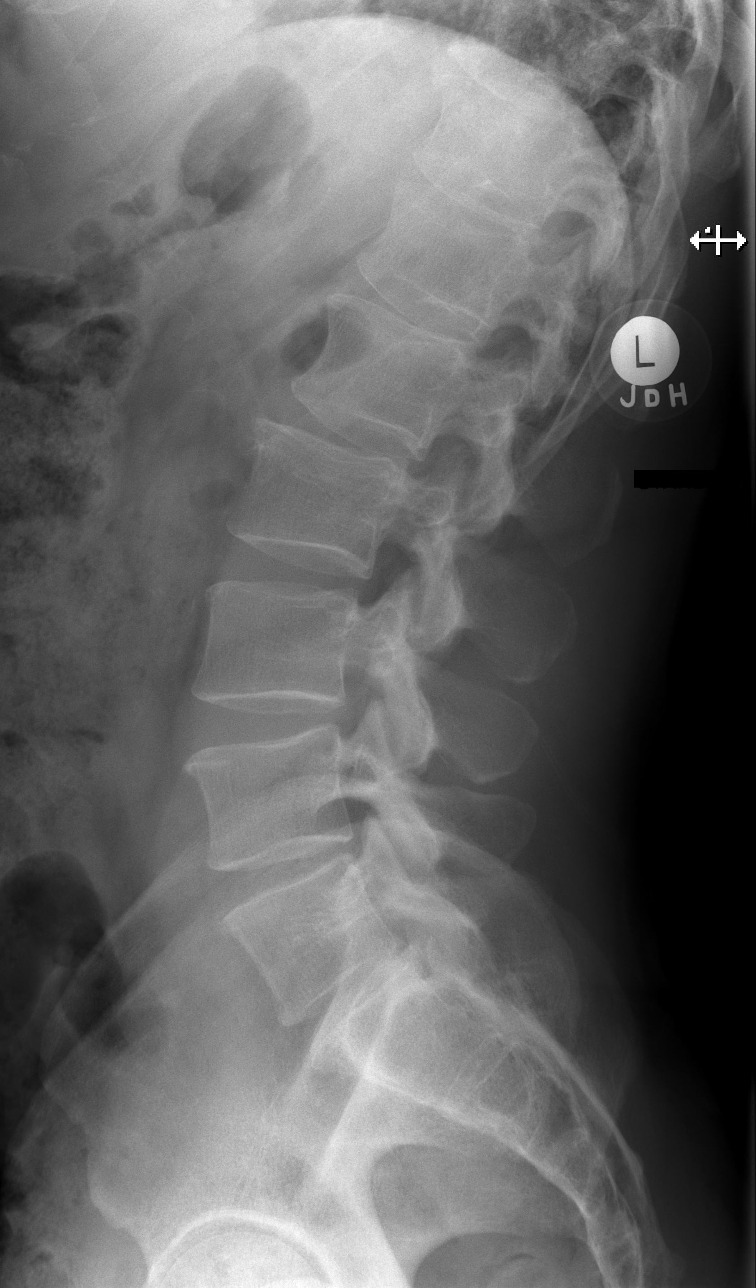

[w lumbar l-5 s-1 spot]
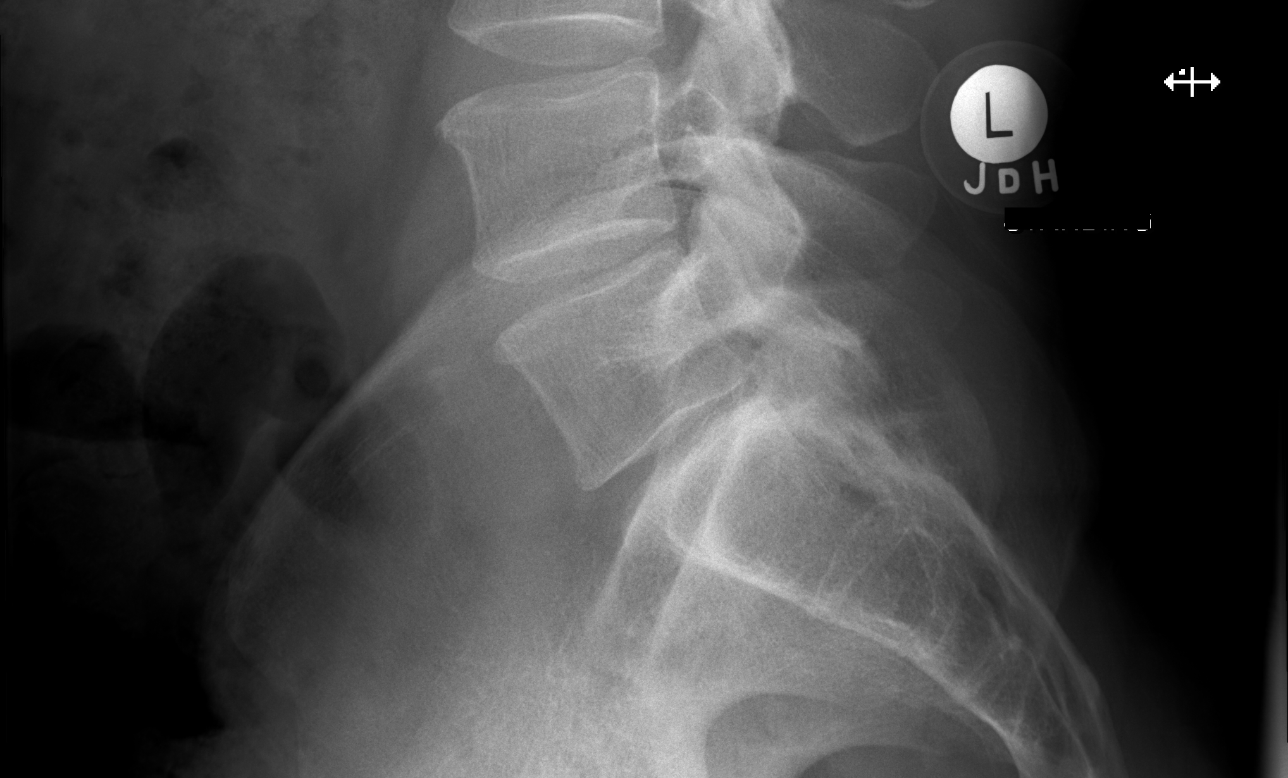

[5 of 5 positions shown; findings below may reference images not displayed]

FINDINGS: Minimal curvature lumbar spine.

L2 superior endplate mild compression fracture/Schmorl's node
deformity with 10% loss of height.

L4-5 and L5-S1 mild disc space narrowing.

No pars defect detected.
IMPRESSION: 1. L2 superior endplate mild compression fracture/Schmorl's node
deformity with 10% loss of height.
2. L4-5 and L5-S1 mild disc space narrowing.

## 2021-02-05 NOTE — Progress Notes (Signed)
Subjective:   Thomas Wilkerson, male    DOB: [birth year], 55 y.o.   MRN: [id number]  Chief complaint:  Coronary artery disease   55 y.o. Caucasian male with hypertension, hyperlipidemia, coronary artery disease, h/o cardiac arrest in 2012 likely from LAD STEMI, treated with primary PCI, h/o seizures, anxiety, depression, BPH, chronic back pain.   Patient has recently had a few episodes of chest pressure, improved with SL nitroglycerin. Pain does not occur every time with physical exertion.  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    Current Outpatient Medications on File Prior to Visit  Medication Sig Dispense Refill   amLODipine (NORVASC) 5 MG tablet Take 1 tablet (5 mg total) by mouth daily. 30 tablet 0   aspirin EC 81 MG tablet Take 81 mg by mouth daily.     atorvastatin (LIPITOR) 40 MG tablet Take 40 mg by mouth daily.     Coenzyme Q10 (CO Q10) 100 MG CAPS Take 100 mg by mouth daily.      cyclobenzaprine (FLEXERIL) 10 MG tablet Take 10 mg by mouth 3 (three) times daily as needed for muscle spasms.      docusate sodium (COLACE) 100 MG capsule Take 100 mg by mouth 2 (two) times daily.     DULoxetine (CYMBALTA) 30 MG capsule Take 30 mg by mouth daily.     escitalopram (LEXAPRO) 10 MG tablet Take 10 mg by mouth daily.     ezetimibe (ZETIA) 10 MG tablet Take 1 tablet by mouth once daily (Patient taking differently: Take 10 mg by mouth daily.) 90 tablet 0   fluticasone (FLONASE) 50 MCG/ACT nasal spray Place 2 sprays into both nostrils daily as needed for allergies.      gabapentin (NEURONTIN) 300 MG capsule Take 300-600 mg by mouth See admin instructions. Take 300 mg in the morning and 600 mg at bedtime     ibuprofen (ADVIL) 200 MG tablet Take 600-800 mg by mouth every 6 (six) hours as needed for moderate pain.     isosorbide mononitrate (IMDUR) 30 MG 24 hr tablet Take 1 tablet (30 mg total) by mouth daily. 90 tablet 3   lisinopril (ZESTRIL) 5 MG tablet Take 5 mg by  mouth daily.     meloxicam (MOBIC) 15 MG tablet Take 15 mg by mouth daily.     Menthol, Topical Analgesic, (BENGAY EX) Apply 1 application topically daily as needed (back pain).     metoprolol tartrate (LOPRESSOR) 25 MG tablet Take 25 mg by mouth 2 (two) times daily.     nitroGLYCERIN (NITROSTAT) 0.4 MG SL tablet Place 0.4 mg under the tongue every 5 (five) minutes as needed for chest pain.     Omega-3 Fatty Acids (FISH OIL) 1000 MG CAPS Take 1,000 mg by mouth daily.     omeprazole (PRILOSEC) 20 MG capsule Take 20 mg by mouth 2 (two) times daily.     Tetrahydrozoline HCl (VISINE OP) Place 1 drop into both eyes daily as needed (dry eyes).     No current facility-administered medications on file prior to visit.    Cardiovascular studies:  EKG 02/06/2021: EKG Sinus  Rhythm  RSR(V1) -nondiagnostic  Coronary angiography 05/22/2020: LM: Normal LAD: Patent pLAD stent with no instent-restenosis LCx: normal Ramus: Normal RCA: Normal Normal LVEF and LVEDP  Walking/lexiscan Sestamibi stress test 02/08/2020: Exercise nuclear stress test was performed using Bruce protocol. After 9:00 test was converted to a walking lexiscan due to HR <85%. Patient reached  10.1 METS, and 64% of age predicted maximum heart rate. Exercise capacity was excellent. Chest pain not reported. Heart rate response attenuated due to beta blocker use. Normal hemodynamic response. Stress EKG at 64% MPHR revealed no ischemia changes.  Normal myocardial perfusion. Stress LVEF 43%, although visually appears normal.  Low risk study.  Mobile cardiac telemetry 4 days 01/30/2020 - 02/04/2020: Dominant rhythm: Sinus. HR 47-126 bpm. Avg HR 66 bpm. Rare VE/SVE, burden <1% No atrial fibrillation/atrial flutter/SVT/VT/high grade AV block, sinus pause >3sec noted. 17 patient triggered events, most associated with sinus rhythm/artifact, rare episodes associated with PVC.   Recent labs: 05/18/2020: Glucose 121, BUN/Cr 15/1.08. EGFR 77.  Na/K 141/4.9. Rest of the CMP normal H/H 15/46. MCV 97. Platelets 329  08/09/2019: Chol 110, TG 73, HDL 41, LDL 54  09/14/2019: Glucose 128. BUN/Cr 18/0.8. eGFR normal.  HbA1C 6.1%  02/03/2019: Chol 150, TG 142, HDL 39, LDL 83  06/22/2018: Glucose 119. BUN/Cr 271/0.9. eGFR normal. Na/K 141/4.6. H/H 15/44. MCV 95. Platelets 294. Chol 151, TG 102, HDL 39, LDL 112.  HbA1C 6.0%   Review of Systems  Cardiovascular:  Positive for chest pain. Negative for dyspnea on exertion, leg swelling, palpitations and syncope.      Vitals:   02/06/21 1023  BP: 121/73  Pulse: 74  Resp: 16  Temp: 98 F (36.7 C)  SpO2: 95%     Body mass index is 29.43 kg/m. Filed Weights   02/06/21 1023  Weight: 211 lb (95.7 kg)      Objective:    Physical Exam Vitals and nursing note reviewed.  Constitutional:      Appearance: He is well-developed.  Neck:     Vascular: No JVD.  Cardiovascular:     Rate and Rhythm: Normal rate and regular rhythm.     Pulses: Intact distal pulses.     Heart sounds: Normal heart sounds. No murmur heard. Pulmonary:     Effort: Pulmonary effort is normal.     Breath sounds: Normal breath sounds. No wheezing or rales.  Musculoskeletal:     Right lower leg: No edema.     Left lower leg: No edema.          Assessment & Recommendations:   55 y.o. Caucasian male with hypertension, hyperlipidemia, coronary artery disease, h/o cardiac arrest in 2012 likely from LAD STEMI, treated with primary PCI, h/o seizures, anxiety, depression, BPH, chronic back pain.    CAD: Patent LAD stent, no other significant CAD. (Cath 05/2020) Continue aspirin, amlodipine 5 mg, lisinopril 5 mg, Imdur 30 mg daily, lipitor 80 mg daily, Zetia 10 mg daily.   Pain possible angina, but not reproducible with every exertion. Previous workup unremarkable in 2021. 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 Hold metoprolol the day of and day  before stress test. Will also obtain echocardiogram. Exercise nuclear stress test calculated EF as 43% in 2021, but was visually normal.   Hypertension: Controlled  Mixed hyperlipidemia: Well controlled.  F/u in 1 year, unless significant abnormalities found on the above testing.   Nigel Mormon, MD [facility] Pager: [phone] Office: [phone] If no answer Cell [phone]

## 2021-02-06 ENCOUNTER — Other Ambulatory Visit: Payer: Self-pay

## 2021-02-06 ENCOUNTER — Encounter: Payer: Self-pay | Admitting: Cardiology

## 2021-02-06 ENCOUNTER — Ambulatory Visit: Payer: 59

## 2021-02-06 ENCOUNTER — Ambulatory Visit: Payer: 59 | Admitting: Cardiology

## 2021-02-06 VITALS — BP 121/73 | HR 74 | Temp 98.0°F | Resp 16 | Ht 71.0 in | Wt 211.0 lb

## 2021-02-06 DIAGNOSIS — E782 Mixed hyperlipidemia: Secondary | ICD-10-CM

## 2021-02-06 DIAGNOSIS — I25118 Atherosclerotic heart disease of native coronary artery with other forms of angina pectoris: Secondary | ICD-10-CM

## 2021-02-06 DIAGNOSIS — I2510 Atherosclerotic heart disease of native coronary artery without angina pectoris: Secondary | ICD-10-CM

## 2021-02-06 DIAGNOSIS — I10 Essential (primary) hypertension: Secondary | ICD-10-CM

## 2021-02-11 ENCOUNTER — Ambulatory Visit: Payer: PRIVATE HEALTH INSURANCE | Admitting: Cardiology

## 2021-02-15 ENCOUNTER — Other Ambulatory Visit: Payer: Self-pay

## 2021-02-15 ENCOUNTER — Ambulatory Visit: Payer: 59

## 2021-02-15 DIAGNOSIS — I25118 Atherosclerotic heart disease of native coronary artery with other forms of angina pectoris: Secondary | ICD-10-CM

## 2021-02-16 LAB — PCV CARDIAC STRESS TEST
Angina Index: 0
ST Depression (mm): 0 mm

## 2021-06-11 ENCOUNTER — Other Ambulatory Visit: Payer: Self-pay

## 2021-06-11 DIAGNOSIS — I25118 Atherosclerotic heart disease of native coronary artery with other forms of angina pectoris: Secondary | ICD-10-CM

## 2021-06-11 MED ORDER — ISOSORBIDE MONONITRATE ER 30 MG PO TB24: 30.0000 mg | ORAL_TABLET | Freq: Every day | ORAL | 3 refills | Status: DC

## 2021-06-12 ENCOUNTER — Other Ambulatory Visit: Payer: Self-pay | Admitting: Cardiology

## 2021-06-12 DIAGNOSIS — I25118 Atherosclerotic heart disease of native coronary artery with other forms of angina pectoris: Secondary | ICD-10-CM

## 2021-12-11 ENCOUNTER — Other Ambulatory Visit: Payer: Self-pay | Admitting: Cardiology

## 2021-12-11 DIAGNOSIS — I25118 Atherosclerotic heart disease of native coronary artery with other forms of angina pectoris: Secondary | ICD-10-CM

## 2022-01-03 ENCOUNTER — Other Ambulatory Visit: Payer: Self-pay | Admitting: Cardiology

## 2022-01-03 DIAGNOSIS — I25118 Atherosclerotic heart disease of native coronary artery with other forms of angina pectoris: Secondary | ICD-10-CM

## 2022-01-10 ENCOUNTER — Other Ambulatory Visit: Payer: Self-pay | Admitting: Family Medicine

## 2022-01-10 DIAGNOSIS — M542 Cervicalgia: Secondary | ICD-10-CM

## 2022-01-10 DIAGNOSIS — R519 Headache, unspecified: Secondary | ICD-10-CM

## 2022-05-19 ENCOUNTER — Telehealth: Payer: Self-pay

## 2022-05-19 ENCOUNTER — Ambulatory Visit: Payer: 59

## 2022-05-19 ENCOUNTER — Ambulatory Visit: Payer: No Typology Code available for payment source

## 2022-05-19 VITALS — BP 110/67 | HR 59 | Resp 16 | Ht 71.0 in | Wt 199.0 lb

## 2022-05-19 DIAGNOSIS — I209 Angina pectoris, unspecified: Secondary | ICD-10-CM

## 2022-05-19 DIAGNOSIS — I25118 Atherosclerotic heart disease of native coronary artery with other forms of angina pectoris: Secondary | ICD-10-CM

## 2022-05-19 LAB — TROPONIN T: Troponin T (Highly Sensitive): 7 ng/L (ref 0–22)

## 2022-05-19 NOTE — Telephone Encounter (Signed)
Pt seeing brittany this morning

## 2022-05-19 NOTE — Progress Notes (Signed)
Subjective:   [name], male    DOB: [birth year], 56 y.o.   MRN: [id number]  Chief complaint:  Coronary artery disease   56 y.o. Caucasian male with hypertension, hyperlipidemia, coronary artery disease, h/o cardiac arrest in 2012 likely from LAD STEMI, treated with primary PCI, h/o seizures, anxiety, depression, BPH, chronic back pain.   He presents today for acute office visit with complaints of chest pain. He has had chest pain 4-5 times over the past month. Last episode of chest pain occurred last night with accompanying left jaw pain and back between shoulder blades. Chest pain is improved with sublingual nitroglycerin. Pain occurs at rest and with exertion. He feels that symptoms are similar to when he had heart attack in 2021. He denies diaphoresis, nausea, shortness of breath.  Current Outpatient Medications on File Prior to Visit  Medication Sig Dispense Refill   amLODipine (NORVASC) 5 MG tablet Take 1 tablet (5 mg total) by mouth daily. 30 tablet 0   aspirin EC 81 MG tablet Take 81 mg by mouth daily.     atorvastatin (LIPITOR) 40 MG tablet Take 40 mg by mouth daily.     Coenzyme Q10 (CO Q10) 100 MG CAPS Take 100 mg by mouth daily.      cyclobenzaprine (FLEXERIL) 10 MG tablet Take 10 mg by mouth 3 (three) times daily as needed for muscle spasms.      docusate sodium (COLACE) 100 MG capsule Take 100 mg by mouth 2 (two) times daily.     DULoxetine (CYMBALTA) 30 MG capsule Take 30 mg by mouth daily.     escitalopram (LEXAPRO) 10 MG tablet Take 10 mg by mouth daily.     ezetimibe (ZETIA) 10 MG tablet Take 1 tablet by mouth once daily (Patient taking differently: Take 10 mg by mouth daily.) 90 tablet 0   fluticasone (FLONASE) 50 MCG/ACT nasal spray Place 2 sprays into both nostrils daily as needed for allergies.      gabapentin (NEURONTIN) 300 MG capsule Take 300-600 mg by mouth See admin instructions. Take 300 mg in the morning and 600 mg at bedtime     ibuprofen (ADVIL) 200  MG tablet Take 600-800 mg by mouth every 6 (six) hours as needed for moderate pain.     isosorbide dinitrate (ISORDIL) 10 MG tablet Take 10 mg by mouth 2 (two) times daily.     isosorbide mononitrate (IMDUR) 30 MG 24 hr tablet Take 1 tablet by mouth once daily 30 tablet 0   lisinopril (ZESTRIL) 5 MG tablet Take 5 mg by mouth daily.     Menthol, Topical Analgesic, (BENGAY EX) Apply 1 application topically daily as needed (back pain).     metoprolol tartrate (LOPRESSOR) 25 MG tablet Take 25 mg by mouth 2 (two) times daily.     nitroGLYCERIN (NITROSTAT) 0.4 MG SL tablet Place 0.4 mg under the tongue every 5 (five) minutes as needed for chest pain.     Omega-3 Fatty Acids (FISH OIL) 1000 MG CAPS Take 1,000 mg by mouth daily.     omeprazole (PRILOSEC) 20 MG capsule Take 20 mg by mouth 2 (two) times daily.     Tetrahydrozoline HCl (VISINE OP) Place 1 drop into both eyes daily as needed (dry eyes).     No current facility-administered medications on file prior to visit.    Cardiovascular studies:  EKG 05/19/2022: Sinus bradycardia at rate of 58 bpm.  Normal axis.  Incomplete right bundle branch block.  No  evidence of ischemia or underlying injury pattern.  Compared to previous EKG on 02/06/2021, no significant change.  Coronary angiography 05/22/2020: LM: Normal LAD: Patent pLAD stent with no instent-restenosis LCx: normal Ramus: Normal RCA: Normal Normal LVEF and LVEDP  Walking/lexiscan Sestamibi stress test 02/08/2020: Exercise nuclear stress test was performed using Bruce protocol. After 9:00 test was converted to a walking lexiscan due to HR <85%. Patient reached 10.1 METS, and 64% of age predicted maximum heart rate. Exercise capacity was excellent. Chest pain not reported. Heart rate response attenuated due to beta blocker use. Normal hemodynamic response. Stress EKG at 64% MPHR revealed no ischemia changes.  Normal myocardial perfusion. Stress LVEF 43%, although visually appears normal.  Low  risk study.  Mobile cardiac telemetry 4 days 01/30/2020 - 02/04/2020: Dominant rhythm: Sinus. HR 47-126 bpm. Avg HR 66 bpm. Rare VE/SVE, burden <1% No atrial fibrillation/atrial flutter/SVT/VT/high grade AV block, sinus pause >3sec noted. 17 patient triggered events, most associated with sinus rhythm/artifact, rare episodes associated with PVC.  Echocardiogram 02/06/2021: Left ventricle cavity is normal in size. Mild concentric hypertrophy of the left ventricle. Normal global wall motion. Normal LV systolic function with visual EF 55-60%. Normal diastolic filling pattern. Left atrial cavity is mildly dilated. Mild (Grade I) mitral regurgitation. No evidence of pulmonary hypertension.  Exercise treadmill stress test 02/15/2021: Exercise treadmill stress test performed using Bruce protocol.  Patient reached 10.1 METS, and 78% of age predicted maximum heart rate.  Exercise capacity was normal.  No chest pain reported.  Relatively flat blood pressure response without lightheadedness symptoms. Attenuated heart response, possibly due to beta blocker use. Stress EKG revealed no ischemic changes. Suboptimal heart rate response with no EKG evidence of ischemia.   Recent labs: 05/18/2020: Glucose 121, BUN/Cr 15/1.08. EGFR 77. Na/K 141/4.9. Rest of the CMP normal H/H 15/46. MCV 97. Platelets 329  08/09/2019: Chol 110, TG 73, HDL 41, LDL 54  09/14/2019: Glucose 128. BUN/Cr 18/0.8. eGFR normal.  HbA1C 6.1%  02/03/2019: Chol 150, TG 142, HDL 39, LDL 83  06/22/2018: Glucose 119. BUN/Cr 271/0.9. eGFR normal. Na/K 141/4.6. H/H 15/44. MCV 95. Platelets 294. Chol 151, TG 102, HDL 39, LDL 112.  HbA1C 6.0%   Review of Systems  Cardiovascular:  Positive for chest pain. Negative for dyspnea on exertion, leg swelling, palpitations and syncope.  Respiratory:  Negative for shortness of breath.   Gastrointestinal:  Positive for heartburn. Negative for nausea and vomiting.    Vitals:   05/19/22 1021  05/19/22 1022  BP:    Pulse:    Resp:    SpO2: 98% 97%   Orthostatic VS for the past 72 hrs (Last 3 readings):  Orthostatic BP Patient Position BP Location Cuff Size Orthostatic Pulse  05/19/22 1022 105/76 Sitting Left Arm Large 70  05/19/22 1021 109/67 Sitting Left Arm Large 64  05/19/22 1018 112/69 Supine Left Arm Large 59    Body mass index is 27.75 kg/m. Filed Weights   05/19/22 1012  Weight: 199 lb (90.3 kg)     Objective:    Physical Exam Vitals and nursing note reviewed.  Constitutional:      Appearance: He is well-developed.  Neck:     Vascular: No JVD.  Cardiovascular:     Rate and Rhythm: Regular rhythm. Bradycardia present.     Pulses: Intact distal pulses.          Carotid pulses are 2+ on the right side and 2+ on the left side.      Radial  pulses are 2+ on the right side and 2+ on the left side.       Dorsalis pedis pulses are 2+ on the right side and 2+ on the left side.     Heart sounds: Normal heart sounds. No murmur heard. Pulmonary:     Effort: Pulmonary effort is normal.     Breath sounds: Normal breath sounds. No wheezing or rales.  Musculoskeletal:     Right lower leg: No edema.     Left lower leg: No edema.     Assessment & Recommendations:   56 y.o. Caucasian male with hypertension, hyperlipidemia, coronary artery disease, h/o cardiac arrest in 2012 likely from LAD STEMI, treated with primary PCI, h/o seizures, anxiety, depression, BPH, chronic back pain.   Angina pectoris ([place]) Coronary artery disease of native artery of native heart with stable angina pectoris ([place]) Pain possible angina given presenting symptoms, but not reproducible with every exertion. Previous workup unremarkable in 2021 and 2022. EKG today did not show evidence of ischemia or underlying injury pattern. Will check STAT Troponin T, if elevated will advise patient to be seen in emergency department. If normal, will proceed with nuclear exercise stress test.  Patient  instructed not to do heavy lifting, heavy exertional activity, swimming until evaluation is complete.  Patient instructed to call if symptoms worse or to go to the ED for further evaluation.  Patient understands that if chest pain worsens or changes to seek emergency care.  Continue aspirin, amlodipine 5 mg, lisinopril 5 mg, Imdur 30 mg daily, lipitor 80 mg daily, Zetia 10 mg daily.    Follow-up in 2 weeks or sooner if needed.   Ernst Spell, [place] Office: [phone] Pager: [phone]

## 2022-05-19 NOTE — Telephone Encounter (Signed)
Please go to ER given ongoing chest pain Thanks MJP

## 2022-05-19 NOTE — Telephone Encounter (Signed)
Spoke with patient, he agreed to go to the ER now.

## 2022-05-19 NOTE — Telephone Encounter (Signed)
Called patient to discuss lab results. Troponin T 7. Will proceed with nuclear exercise stress test as planned. Patient understands that if symptoms worsen or changes to seek emergency care.  Telephone: 5 minutes

## 2022-05-19 NOTE — Telephone Encounter (Signed)
Patient is currently having chest pain/pressure and was having left jaw pain last night. He has taken 3 nitroglycerin over the past week on different days. Would you like for him to come in today? Please advise.

## 2022-05-26 ENCOUNTER — Ambulatory Visit: Payer: No Typology Code available for payment source

## 2022-05-26 DIAGNOSIS — I25118 Atherosclerotic heart disease of native coronary artery with other forms of angina pectoris: Secondary | ICD-10-CM

## 2022-05-26 DIAGNOSIS — I209 Angina pectoris, unspecified: Secondary | ICD-10-CM

## 2022-05-27 ENCOUNTER — Other Ambulatory Visit: Payer: No Typology Code available for payment source

## 2022-06-05 ENCOUNTER — Ambulatory Visit: Payer: No Typology Code available for payment source | Admitting: Cardiology

## 2022-06-05 ENCOUNTER — Ambulatory Visit: Payer: No Typology Code available for payment source

## 2022-06-05 ENCOUNTER — Encounter: Payer: Self-pay | Admitting: Cardiology

## 2022-06-05 ENCOUNTER — Other Ambulatory Visit: Payer: No Typology Code available for payment source

## 2022-06-05 VITALS — BP 120/72 | HR 71 | Resp 17 | Ht 71.0 in | Wt 197.0 lb

## 2022-06-05 DIAGNOSIS — I25118 Atherosclerotic heart disease of native coronary artery with other forms of angina pectoris: Secondary | ICD-10-CM

## 2022-06-05 DIAGNOSIS — R002 Palpitations: Secondary | ICD-10-CM

## 2022-06-05 MED ORDER — METOPROLOL TARTRATE 50 MG PO TABS: 50.0000 mg | ORAL_TABLET | Freq: Two times a day (BID) | ORAL | 2 refills | Status: DC

## 2022-06-05 NOTE — Progress Notes (Signed)
Subjective:   Thomas Wilkerson, male    DOB: 1965-07-21, 56 y.o.   MRN: [id number]  Chief complaint:  Coronary artery disease   56 y.o. Caucasian male with hypertension, hyperlipidemia, coronary artery disease, h/o cardiac arrest in 2012 likely from LAD STEMI, treated with primary PCI, h/o seizures, anxiety, depression, BPH, chronic back pain.   Patient continues to have episodes of chest pain with exertion requiring NTG about 2-3 times a month. More than chest pain, palpitations bother him more, happen more frequently. Reviewed recent test results with the patient, details below.     Current Outpatient Medications:    amLODipine (NORVASC) 5 MG tablet, Take 1 tablet (5 mg total) by mouth daily., Disp: 30 tablet, Rfl: 0   aspirin EC 81 MG tablet, Take 81 mg by mouth daily., Disp: , Rfl:    atorvastatin (LIPITOR) 40 MG tablet, Take 40 mg by mouth daily., Disp: , Rfl:    Coenzyme Q10 (CO Q10) 100 MG CAPS, Take 100 mg by mouth daily. , Disp: , Rfl:    cyclobenzaprine (FLEXERIL) 10 MG tablet, Take 10 mg by mouth 3 (three) times daily as needed for muscle spasms. , Disp: , Rfl:    docusate sodium (COLACE) 100 MG capsule, Take 100 mg by mouth 2 (two) times daily., Disp: , Rfl:    DULoxetine (CYMBALTA) 30 MG capsule, Take 30 mg by mouth daily., Disp: , Rfl:    escitalopram (LEXAPRO) 10 MG tablet, Take 10 mg by mouth daily., Disp: , Rfl:    ezetimibe (ZETIA) 10 MG tablet, Take 1 tablet by mouth once daily (Patient taking differently: Take 10 mg by mouth daily.), Disp: 90 tablet, Rfl: 0   fluticasone (FLONASE) 50 MCG/ACT nasal spray, Place 2 sprays into both nostrils daily as needed for allergies. , Disp: , Rfl:    gabapentin (NEURONTIN) 300 MG capsule, Take 300-600 mg by mouth See admin instructions. Take 300 mg in the morning and 600 mg at bedtime, Disp: , Rfl:    ibuprofen (ADVIL) 200 MG tablet, Take 600-800 mg by mouth every 6 (six) hours as needed for moderate pain., Disp: , Rfl:     isosorbide mononitrate (IMDUR) 30 MG 24 hr tablet, Take 1 tablet by mouth once daily, Disp: 30 tablet, Rfl: 0   lisinopril (ZESTRIL) 5 MG tablet, Take 5 mg by mouth daily., Disp: , Rfl:    metoprolol tartrate (LOPRESSOR) 25 MG tablet, Take 25 mg by mouth 2 (two) times daily., Disp: , Rfl:    nitroGLYCERIN (NITROSTAT) 0.4 MG SL tablet, Place 0.4 mg under the tongue every 5 (five) minutes as needed for chest pain., Disp: , Rfl:    Omega-3 Fatty Acids (FISH OIL) 1000 MG CAPS, Take 1,000 mg by mouth daily., Disp: , Rfl:    omeprazole (PRILOSEC) 20 MG capsule, Take 20 mg by mouth 2 (two) times daily., Disp: , Rfl:    Tetrahydrozoline HCl (VISINE OP), Place 1 drop into both eyes daily as needed (dry eyes)., Disp: , Rfl:    Cardiovascular studies:  Exercise Sestamibi stress test 05/26/2022: Exercise nuclear stress test was performed using Bruce protocol. Patient reached 10.7 METS, and 86% of age predicted maximum heart rate. Exercise capacity was excellent. Non-limiting chest pain reported. Heart rate and hemodynamic response were normal. Stress EKG revealed no ischemic changes. SPECT images showed very small sized, mild intensity, reversible perfusion defect in apical anterior myocardium. Stress LV EF calculated 42%, but visually appears around 50%. Low risk study. Compared  to previous study in 2021, mild ischemia is new.   EKG 02/06/2021: EKG Sinus  Rhythm  RSR(V1) -nondiagnostic  Coronary angiography 05/22/2020: LM: Normal LAD: Patent pLAD stent with no instent-restenosis LCx: normal Ramus: Normal RCA: Normal Normal LVEF and LVEDP  Mobile cardiac telemetry 4 days 01/30/2020 - 02/04/2020: Dominant rhythm: Sinus. HR 47-126 bpm. Avg HR 66 bpm. Rare VE/SVE, burden <1% No atrial fibrillation/atrial flutter/SVT/VT/high grade AV block, sinus pause >3sec noted. 17 patient triggered events, most associated with sinus rhythm/artifact, rare episodes associated with PVC.   Recent  labs: 01/09/2022: Glucose 131, BUN/Cr 13/0.9. EGFR 97. K 4.8. HbA1C 6.2% Chol 93, TG 106, HDL 36, LDL 37  Review of Systems  Cardiovascular:  Positive for chest pain and palpitations. Negative for dyspnea on exertion, leg swelling and syncope.       Vitals:   06/05/22 1438  BP: 120/72  Pulse: 71  Resp: 17  SpO2: 97%     Body mass index is 27.48 kg/m. Filed Weights   06/05/22 1438  Weight: 197 lb (89.4 kg)      Objective:    Physical Exam Vitals and nursing note reviewed.  Constitutional:      Appearance: He is well-developed.  Neck:     Vascular: No JVD.  Cardiovascular:     Rate and Rhythm: Normal rate and regular rhythm.     Pulses: Intact distal pulses.     Heart sounds: Normal heart sounds. No murmur heard. Pulmonary:     Effort: Pulmonary effort is normal.     Breath sounds: Normal breath sounds. No wheezing or rales.  Musculoskeletal:     Right lower leg: No edema.     Left lower leg: No edema.           Assessment & Recommendations:   56 y.o. Caucasian male with hypertension, hyperlipidemia, coronary artery disease, h/o cardiac arrest in 2012 likely from LAD STEMI, treated with primary PCI, h/o seizures, anxiety, depression, BPH, chronic back pain.    CAD: Patent LAD stent, no other significant CAD. (Cath 05/2020). Only mild ischemia on stress testing (05/2022). Increase metoprolol tartrate to 50 mg bid, which will hopefully help with both chest pain and palpitations. Continue aspirin, amlodipine 5 mg, lisinopril 5 mg, Imdur 30 mg daily, lipitor 80 mg daily, Zetia 10 mg daily.   Check 2 week cardiac telemetry.   Hypertension: Controlled  Mixed hyperlipidemia: Well controlled.  F/u in 4 weeks   Nigel Mormon, MD Pager: [phone] Office: [phone]

## 2022-06-07 ENCOUNTER — Encounter: Payer: Self-pay | Admitting: Cardiology

## 2022-06-30 ENCOUNTER — Ambulatory Visit: Payer: No Typology Code available for payment source | Admitting: Cardiology

## 2022-06-30 ENCOUNTER — Encounter: Payer: Self-pay | Admitting: Cardiology

## 2022-06-30 VITALS — BP 106/60 | HR 52 | Resp 16 | Wt 201.0 lb

## 2022-06-30 DIAGNOSIS — I25118 Atherosclerotic heart disease of native coronary artery with other forms of angina pectoris: Secondary | ICD-10-CM

## 2022-06-30 DIAGNOSIS — R002 Palpitations: Secondary | ICD-10-CM

## 2022-06-30 DIAGNOSIS — I209 Angina pectoris, unspecified: Secondary | ICD-10-CM

## 2022-06-30 NOTE — Progress Notes (Signed)
Subjective:   Thomas Wilkerson, male    DOB: July 24, 1965, 57 y.o.   MRN: [id number]  Chief complaint:  Coronary artery disease   57 y.o. Caucasian male with hypertension, hyperlipidemia, coronary artery disease, h/o cardiac arrest in 2012 likely from LAD STEMI, treated with primary PCI, h/o seizures, anxiety, depression, BPH, chronic back pain.   Palpitation have improved, but he continues ot have chest pain episodes couple times a week, usually after physical exertion. Reviewed recent test results with the patient, details below.   On a separate note, he has had exposure to poison Ivy, with rash on his left arm and right upper cheek.   Current Outpatient Medications:    amLODipine (NORVASC) 5 MG tablet, Take 1 tablet (5 mg total) by mouth daily., Disp: 30 tablet, Rfl: 0   aspirin EC 81 MG tablet, Take 81 mg by mouth daily., Disp: , Rfl:    atorvastatin (LIPITOR) 40 MG tablet, Take 40 mg by mouth daily., Disp: , Rfl:    Coenzyme Q10 (CO Q10) 100 MG CAPS, Take 100 mg by mouth daily. , Disp: , Rfl:    cyclobenzaprine (FLEXERIL) 10 MG tablet, Take 10 mg by mouth 3 (three) times daily as needed for muscle spasms. , Disp: , Rfl:    docusate sodium (COLACE) 100 MG capsule, Take 100 mg by mouth 2 (two) times daily., Disp: , Rfl:    DULoxetine (CYMBALTA) 30 MG capsule, Take 30 mg by mouth daily., Disp: , Rfl:    escitalopram (LEXAPRO) 10 MG tablet, Take 10 mg by mouth daily., Disp: , Rfl:    ezetimibe (ZETIA) 10 MG tablet, Take 1 tablet by mouth once daily (Patient taking differently: Take 10 mg by mouth daily.), Disp: 90 tablet, Rfl: 0   fluticasone (FLONASE) 50 MCG/ACT nasal spray, Place 2 sprays into both nostrils daily as needed for allergies. , Disp: , Rfl:    gabapentin (NEURONTIN) 300 MG capsule, Take 300-600 mg by mouth See admin instructions. Take 300 mg in the morning and 600 mg at bedtime, Disp: , Rfl:    ibuprofen (ADVIL) 200 MG tablet, Take 600-800 mg by mouth every 6 (six) hours as  needed for moderate pain., Disp: , Rfl:    isosorbide mononitrate (IMDUR) 30 MG 24 hr tablet, Take 1 tablet by mouth once daily, Disp: 30 tablet, Rfl: 0   lisinopril (ZESTRIL) 5 MG tablet, Take 5 mg by mouth daily., Disp: , Rfl:    metoprolol tartrate (LOPRESSOR) 50 MG tablet, Take 1 tablet (50 mg total) by mouth 2 (two) times daily., Disp: 60 tablet, Rfl: 2   nitroGLYCERIN (NITROSTAT) 0.4 MG SL tablet, Place 0.4 mg under the tongue every 5 (five) minutes as needed for chest pain., Disp: , Rfl:    Omega-3 Fatty Acids (FISH OIL) 1000 MG CAPS, Take 1,000 mg by mouth daily., Disp: , Rfl:    omeprazole (PRILOSEC) 20 MG capsule, Take 20 mg by mouth 2 (two) times daily., Disp: , Rfl:    Tetrahydrozoline HCl (VISINE OP), Place 1 drop into both eyes daily as needed (dry eyes)., Disp: , Rfl:    Cardiovascular studies:  Mobile cardiac telemetry 6 days 06/05/2022 - 06/12/2022: Dominant rhythm: Sinus. HR 41-128 bpm. Avg HR 64 bpm, in sinus rhythm. 0 episodes of SV. <1% isolated SVE, couplet/triplets. 0 episodes of VT. <1% isolated VE, no couplet/triplets. No atrial fibrillation/atrial flutter/SVT/VT/high grade AV block, sinus pause >3sec noted. 2 patient triggered events, correlated with sinus rhythm. No arrhythmia noted.[name]  Exercise Sestamibi stress test 05/26/2022: Exercise nuclear stress test was performed using Bruce protocol. Patient reached 10.7 METS, and 86% of age predicted maximum heart rate. Exercise capacity was excellent. Non-limiting chest pain reported. Heart rate and hemodynamic response were normal. Stress EKG revealed no ischemic changes. SPECT images showed very small sized, mild intensity, reversible perfusion defect in apical anterior myocardium. Stress LV EF calculated 42%, but visually appears around 50%. Low risk study. Compared to previous study in 2021, mild ischemia is new.   EKG 02/06/2021: EKG Sinus  Rhythm  RSR(V1) -nondiagnostic  Coronary angiography 05/22/2020: LM:  Normal LAD: Patent pLAD stent with no instent-restenosis LCx: normal Ramus: Normal RCA: Normal Normal LVEF and LVEDP  Mobile cardiac telemetry 4 days 01/30/2020 - 02/04/2020: Dominant rhythm: Sinus. HR 47-126 bpm. Avg HR 66 bpm. Rare VE/SVE, burden <1% No atrial fibrillation/atrial flutter/SVT/VT/high grade AV block, sinus pause >3sec noted. 17 patient triggered events, most associated with sinus rhythm/artifact, rare episodes associated with PVC.   Recent labs: 01/09/2022: Glucose 131, BUN/Cr 13/0.9. EGFR 97. K 4.8. HbA1C 6.2% Chol 93, TG 106, HDL 36, LDL 37  Review of Systems  Cardiovascular:  Positive for chest pain and palpitations. Negative for dyspnea on exertion, leg swelling and syncope.       There were no vitals filed for this visit.    Body mass index is 28.03 kg/m. Filed Weights   06/30/22 1105  Weight: 201 lb (91.2 kg)      Objective:    Physical Exam Vitals and nursing note reviewed.  Constitutional:      Appearance: He is well-developed.  Neck:     Vascular: No JVD.  Cardiovascular:     Rate and Rhythm: Normal rate and regular rhythm.     Pulses: Intact distal pulses.     Heart sounds: Normal heart sounds. No murmur heard. Pulmonary:     Effort: Pulmonary effort is normal.     Breath sounds: Normal breath sounds. No wheezing or rales.  Musculoskeletal:     Right lower leg: No edema.     Left lower leg: No edema.           Assessment & Recommendations:   57 y.o. Caucasian male with hypertension, hyperlipidemia, coronary artery disease, h/o cardiac arrest in 2012 likely from LAD STEMI, treated with primary PCI, h/o seizures, anxiety, depression, BPH, chronic back pain.    CAD: Patent LAD stent, no other significant CAD. (Cath 05/2020). Only mild ischemia on stress testing (05/2022). However, he continues to have recurrent angina symptoms on metoprolol tartrate to 50 mg bid, Imdur 30 mg daily, amlodipine 5 mg daily. Given persistent  symptoms, recommend coronary angiography, possible intervention. Schedule for cardiac catheterization, and possible intervention. We discussed regarding risks, benefits, alternatives to this including stress testing, CTA and continued medical therapy. Patient wants to proceed. Understands <1-2% risk of death, stroke, MI, urgent CABG, bleeding, infection, renal failure but not limited to these. Continue aspirin 81 mg, lipitor 80 mg daily, Zetia 10 mg daily.    Palpitations: No significant arrhythmia on cardiac telemetry (05/2022)  Hypertension: Controlled  Mixed hyperlipidemia: Well controlled.  On a separate note, okay to use benadryl for poison [place] rash/itching. Avoid driving If no better, contact PCP.  F/u after cath    Nigel Mormon, MD Pager: [phone] Office: [phone]

## 2022-07-01 ENCOUNTER — Encounter (HOSPITAL_COMMUNITY)
Admission: RE | Disposition: A | Discharge status: Home / Self Care | Payer: Self-pay | Source: Ambulatory Visit | Attending: Cardiology

## 2022-07-01 ENCOUNTER — Other Ambulatory Visit: Payer: Self-pay

## 2022-07-01 ENCOUNTER — Encounter (HOSPITAL_COMMUNITY): Payer: Self-pay | Admitting: Cardiology

## 2022-07-01 ENCOUNTER — Ambulatory Visit (HOSPITAL_COMMUNITY)
Admission: RE | Admit: 2022-07-01 | Discharge: 2022-07-01 | Disposition: A | Discharge status: Home / Self Care | Payer: No Typology Code available for payment source | Source: Ambulatory Visit | Attending: Cardiology | Admitting: Cardiology

## 2022-07-01 DIAGNOSIS — F419 Anxiety disorder, unspecified: Secondary | ICD-10-CM | POA: Insufficient documentation

## 2022-07-01 DIAGNOSIS — I10 Essential (primary) hypertension: Secondary | ICD-10-CM | POA: Diagnosis not present

## 2022-07-01 DIAGNOSIS — I252 Old myocardial infarction: Secondary | ICD-10-CM | POA: Insufficient documentation

## 2022-07-01 DIAGNOSIS — M549 Dorsalgia, unspecified: Secondary | ICD-10-CM | POA: Insufficient documentation

## 2022-07-01 DIAGNOSIS — Z79899 Other long term (current) drug therapy: Secondary | ICD-10-CM | POA: Insufficient documentation

## 2022-07-01 DIAGNOSIS — G8929 Other chronic pain: Secondary | ICD-10-CM | POA: Insufficient documentation

## 2022-07-01 DIAGNOSIS — E782 Mixed hyperlipidemia: Secondary | ICD-10-CM | POA: Diagnosis not present

## 2022-07-01 DIAGNOSIS — Z8674 Personal history of sudden cardiac arrest: Secondary | ICD-10-CM | POA: Diagnosis not present

## 2022-07-01 DIAGNOSIS — I25118 Atherosclerotic heart disease of native coronary artery with other forms of angina pectoris: Secondary | ICD-10-CM | POA: Insufficient documentation

## 2022-07-01 DIAGNOSIS — Z955 Presence of coronary angioplasty implant and graft: Secondary | ICD-10-CM | POA: Diagnosis not present

## 2022-07-01 DIAGNOSIS — N400 Enlarged prostate without lower urinary tract symptoms: Secondary | ICD-10-CM | POA: Insufficient documentation

## 2022-07-01 DIAGNOSIS — F32A Depression, unspecified: Secondary | ICD-10-CM | POA: Diagnosis not present

## 2022-07-01 DIAGNOSIS — Z7982 Long term (current) use of aspirin: Secondary | ICD-10-CM | POA: Insufficient documentation

## 2022-07-01 HISTORY — PX: LEFT HEART CATH AND CORONARY ANGIOGRAPHY: CATH118249

## 2022-07-01 HISTORY — PX: INTRAVASCULAR PRESSURE WIRE/FFR STUDY: CATH118243

## 2022-07-01 LAB — BASIC METABOLIC PANEL
BUN/Creatinine Ratio: 13 (ref 9–20)
BUN: 12 mg/dL (ref 6–24)
CO2: 23 mmol/L (ref 20–29)
Calcium: 10 mg/dL (ref 8.7–10.2)
Chloride: 105 mmol/L (ref 96–106)
Creatinine, Ser: 0.94 mg/dL (ref 0.76–1.27)
Glucose: 103 mg/dL — ABNORMAL HIGH (ref 70–99)
Potassium: 4.5 mmol/L (ref 3.5–5.2)
Sodium: 142 mmol/L (ref 134–144)
eGFR: 95 mL/min/{1.73_m2} (ref >59)

## 2022-07-01 LAB — CBC
Hematocrit: 43.4 % (ref 37.5–51.0)
Hemoglobin: 14.6 g/dL (ref 13.0–17.7)
MCH: 32.7 pg (ref 26.6–33.0)
MCHC: 33.6 g/dL (ref 31.5–35.7)
MCV: 97 fL (ref 79–97)
Platelets: 328 10*3/uL (ref 150–450)
RBC: 4.47 x10E6/uL (ref 4.14–5.80)
RDW: 12.3 % (ref 11.6–15.4)
WBC: 7.9 10*3/uL (ref 3.4–10.8)

## 2022-07-01 LAB — POCT ACTIVATED CLOTTING TIME: Activated Clotting Time: 255 seconds

## 2022-07-01 SURGERY — LEFT HEART CATH AND CORONARY ANGIOGRAPHY
Anesthesia: LOCAL

## 2022-07-01 MED ORDER — HYDRALAZINE HCL 20 MG/ML IJ SOLN: 10.0000 mg | INTRAMUSCULAR | Status: DC | PRN

## 2022-07-01 MED ORDER — NITROGLYCERIN 1 MG/10 ML FOR IR/CATH LAB
INTRA_ARTERIAL | Status: AC
  Filled 2022-07-01: qty 10

## 2022-07-01 MED ORDER — ACETAMINOPHEN 325 MG PO TABS: 650.0000 mg | ORAL_TABLET | ORAL | Status: DC | PRN

## 2022-07-01 MED ORDER — HEPARIN (PORCINE) IN NACL 1000-0.9 UT/500ML-% IV SOLN
INTRAVENOUS | Status: DC | PRN
  Administered 2022-07-01 (×2): 500 mL

## 2022-07-01 MED ORDER — HEPARIN (PORCINE) IN NACL 1000-0.9 UT/500ML-% IV SOLN
INTRAVENOUS | Status: AC
  Filled 2022-07-01: qty 1000

## 2022-07-01 MED ORDER — SODIUM CHLORIDE 0.9 % IV SOLN: INTRAVENOUS | Status: DC

## 2022-07-01 MED ORDER — SODIUM CHLORIDE 0.9% FLUSH: 3.0000 mL | Freq: Two times a day (BID) | INTRAVENOUS | Status: DC

## 2022-07-01 MED ORDER — NITROGLYCERIN 1 MG/10 ML FOR IR/CATH LAB
INTRA_ARTERIAL | Status: DC | PRN
  Administered 2022-07-01: 200 ug via INTRACORONARY

## 2022-07-01 MED ORDER — ADENOSINE 12 MG/4ML IV SOLN
INTRAVENOUS | Status: AC
  Filled 2022-07-01: qty 16

## 2022-07-01 MED ORDER — SODIUM CHLORIDE 0.9% FLUSH: 3.0000 mL | INTRAVENOUS | Status: DC | PRN

## 2022-07-01 MED ORDER — ASPIRIN 81 MG PO CHEW: 81.0000 mg | CHEWABLE_TABLET | ORAL | Status: DC

## 2022-07-01 MED ORDER — IOHEXOL 350 MG/ML SOLN
INTRAVENOUS | Status: DC | PRN
  Administered 2022-07-01: 50 mL

## 2022-07-01 MED ORDER — SODIUM CHLORIDE 0.9 % IV SOLN: 250.0000 mL | INTRAVENOUS | Status: DC | PRN

## 2022-07-01 MED ORDER — ONDANSETRON HCL 4 MG/2ML IJ SOLN: 4.0000 mg | Freq: Four times a day (QID) | INTRAMUSCULAR | Status: DC | PRN

## 2022-07-01 MED ORDER — HEPARIN SODIUM (PORCINE) 1000 UNIT/ML IJ SOLN
INTRAMUSCULAR | Status: DC | PRN
  Administered 2022-07-01 (×2): 4500 [IU] via INTRAVENOUS

## 2022-07-01 MED ORDER — LABETALOL HCL 5 MG/ML IV SOLN: 10.0000 mg | INTRAVENOUS | Status: DC | PRN

## 2022-07-01 MED ORDER — LIDOCAINE HCL (PF) 1 % IJ SOLN
INTRAMUSCULAR | Status: AC
  Filled 2022-07-01: qty 30

## 2022-07-01 MED ORDER — MIDAZOLAM HCL 2 MG/2ML IJ SOLN
INTRAMUSCULAR | Status: AC
  Filled 2022-07-01: qty 2

## 2022-07-01 MED ORDER — FENTANYL CITRATE (PF) 100 MCG/2ML IJ SOLN
INTRAMUSCULAR | Status: AC
  Filled 2022-07-01: qty 2

## 2022-07-01 MED ORDER — ADENOSINE (DIAGNOSTIC) 140MCG/KG/MIN
INTRAVENOUS | Status: DC | PRN
  Administered 2022-07-01: 140 ug/kg/min via INTRAVENOUS

## 2022-07-01 MED ORDER — VERAPAMIL HCL 2.5 MG/ML IV SOLN
INTRAVENOUS | Status: DC | PRN
  Administered 2022-07-01: 10 mL via INTRA_ARTERIAL

## 2022-07-01 MED ORDER — SODIUM CHLORIDE 0.9 % WEIGHT BASED INFUSION
3.0000 mL/kg/h | INTRAVENOUS | Status: AC
  Administered 2022-07-01: 3 mL/kg/h via INTRAVENOUS

## 2022-07-01 MED ORDER — FENTANYL CITRATE (PF) 100 MCG/2ML IJ SOLN
INTRAMUSCULAR | Status: DC | PRN
  Administered 2022-07-01: 25 ug via INTRAVENOUS

## 2022-07-01 MED ORDER — SODIUM CHLORIDE 0.9 % WEIGHT BASED INFUSION: 1.0000 mL/kg/h | INTRAVENOUS | Status: DC

## 2022-07-01 MED ORDER — LIDOCAINE HCL (PF) 1 % IJ SOLN
INTRAMUSCULAR | Status: DC | PRN
  Administered 2022-07-01: 2 mL

## 2022-07-01 MED ORDER — VERAPAMIL HCL 2.5 MG/ML IV SOLN
INTRAVENOUS | Status: AC
  Filled 2022-07-01: qty 2

## 2022-07-01 MED ORDER — HEPARIN SODIUM (PORCINE) 1000 UNIT/ML IJ SOLN
INTRAMUSCULAR | Status: AC
  Filled 2022-07-01: qty 10

## 2022-07-01 MED ORDER — MIDAZOLAM HCL 2 MG/2ML IJ SOLN
INTRAMUSCULAR | Status: DC | PRN
  Administered 2022-07-01: 1 mg via INTRAVENOUS

## 2022-07-01 SURGICAL SUPPLY — 13 items
CATH 5FR JL3.5 JR4 ANG PIG MP (CATHETERS) IMPLANT
CATH LAUNCHER 6FR EBU3.5 (CATHETERS) IMPLANT
DEVICE RAD COMP TR BAND LRG (VASCULAR PRODUCTS) IMPLANT
GLIDESHEATH SLEND A-KIT 6F 22G (SHEATH) IMPLANT
GUIDEWIRE INQWIRE 1.5J.035X260 (WIRE) IMPLANT
GUIDEWIRE PRESSURE X 175 (WIRE) IMPLANT
INQWIRE 1.5J .035X260CM (WIRE) ×1
KIT HEART LEFT (KITS) ×1 IMPLANT
PACK CARDIAC CATHETERIZATION (CUSTOM PROCEDURE TRAY) ×1 IMPLANT
SHEATH PROBE COVER 6X72 (BAG) IMPLANT
TRANSDUCER W/STOPCOCK (MISCELLANEOUS) ×1 IMPLANT
TUBING CIL FLEX 10 FLL-RA (TUBING) ×1 IMPLANT
VALVE GUARDIAN II ~~LOC~~ HEMO (MISCELLANEOUS) IMPLANT

## 2022-07-01 NOTE — H&P (Signed)
OV 06/30/2022 copied for documentation      Subjective:   Thomas Wilkerson, male    DOB: 25-Jan-1966, 57 y.o.   MRN: [id number]  Chief complaint:  Coronary artery disease   57 y.o. Caucasian male with hypertension, hyperlipidemia, coronary artery disease, h/o cardiac arrest in 2012 likely from LAD STEMI, treated with primary PCI, h/o seizures, anxiety, depression, BPH, chronic back pain.   Palpitation have improved, but he continues ot have chest pain episodes couple times a week, usually after physical exertion. Reviewed recent test results with the patient, details below.   On a separate note, he has had exposure to poison Ivy, with rash on his left arm and right upper cheek.   Current Outpatient Medications:    amLODipine (NORVASC) 5 MG tablet, Take 1 tablet (5 mg total) by mouth daily., Disp: 30 tablet, Rfl: 0   aspirin EC 81 MG tablet, Take 81 mg by mouth daily., Disp: , Rfl:    atorvastatin (LIPITOR) 40 MG tablet, Take 40 mg by mouth daily., Disp: , Rfl:    Coenzyme Q10 (CO Q10) 100 MG CAPS, Take 100 mg by mouth daily. , Disp: , Rfl:    cyclobenzaprine (FLEXERIL) 10 MG tablet, Take 10 mg by mouth 3 (three) times daily as needed for muscle spasms. , Disp: , Rfl:    docusate sodium (COLACE) 100 MG capsule, Take 100 mg by mouth 2 (two) times daily., Disp: , Rfl:    DULoxetine (CYMBALTA) 30 MG capsule, Take 30 mg by mouth daily., Disp: , Rfl:    escitalopram (LEXAPRO) 10 MG tablet, Take 10 mg by mouth daily., Disp: , Rfl:    ezetimibe (ZETIA) 10 MG tablet, Take 1 tablet by mouth once daily (Patient taking differently: Take 10 mg by mouth daily.), Disp: 90 tablet, Rfl: 0   fluticasone (FLONASE) 50 MCG/ACT nasal spray, Place 2 sprays into both nostrils daily as needed for allergies. , Disp: , Rfl:    gabapentin (NEURONTIN) 300 MG capsule, Take 300-600 mg by mouth See admin instructions. Take 300 mg in the morning and 600 mg at bedtime, Disp: , Rfl:    ibuprofen (ADVIL) 200 MG tablet, Take  600-800 mg by mouth every 6 (six) hours as needed for moderate pain., Disp: , Rfl:    isosorbide mononitrate (IMDUR) 30 MG 24 hr tablet, Take 1 tablet by mouth once daily, Disp: 30 tablet, Rfl: 0   lisinopril (ZESTRIL) 5 MG tablet, Take 5 mg by mouth daily., Disp: , Rfl:    metoprolol tartrate (LOPRESSOR) 50 MG tablet, Take 1 tablet (50 mg total) by mouth 2 (two) times daily., Disp: 60 tablet, Rfl: 2   nitroGLYCERIN (NITROSTAT) 0.4 MG SL tablet, Place 0.4 mg under the tongue every 5 (five) minutes as needed for chest pain., Disp: , Rfl:    Omega-3 Fatty Acids (FISH OIL) 1000 MG CAPS, Take 1,000 mg by mouth daily., Disp: , Rfl:    omeprazole (PRILOSEC) 20 MG capsule, Take 20 mg by mouth 2 (two) times daily., Disp: , Rfl:    Tetrahydrozoline HCl (VISINE OP), Place 1 drop into both eyes daily as needed (dry eyes)., Disp: , Rfl:    Cardiovascular studies:  Mobile cardiac telemetry 6 days 06/05/2022 - 06/12/2022: Dominant rhythm: Sinus. HR 41-128 bpm. Avg HR 64 bpm, in sinus rhythm. 0 episodes of SV. <1% isolated SVE, couplet/triplets. 0 episodes of VT. <1% isolated VE, no couplet/triplets. No atrial fibrillation/atrial flutter/SVT/VT/high grade AV block, sinus pause >3sec noted. 2 patient triggered events,  correlated with sinus rhythm. No arrhythmia noted..   Exercise Sestamibi stress test 05/26/2022: Exercise nuclear stress test was performed using Bruce protocol. Patient reached 10.7 METS, and 86% of age predicted maximum heart rate. Exercise capacity was excellent. Non-limiting chest pain reported. Heart rate and hemodynamic response were normal. Stress EKG revealed no ischemic changes. SPECT images showed very small sized, mild intensity, reversible perfusion defect in apical anterior myocardium. Stress LV EF calculated 42%, but visually appears around 50%. Low risk study. Compared to previous study in 2021, mild ischemia is new.   EKG 02/06/2021: EKG Sinus  Rhythm  RSR(V1)  -nondiagnostic  Coronary angiography 05/22/2020: LM: Normal LAD: Patent pLAD stent with no instent-restenosis LCx: normal Ramus: Normal RCA: Normal Normal LVEF and LVEDP  Mobile cardiac telemetry 4 days 01/30/2020 - 02/04/2020: Dominant rhythm: Sinus. HR 47-126 bpm. Avg HR 66 bpm. Rare VE/SVE, burden <1% No atrial fibrillation/atrial flutter/SVT/VT/high grade AV block, sinus pause >3sec noted. 17 patient triggered events, most associated with sinus rhythm/artifact, rare episodes associated with PVC.   Recent labs: 01/09/2022: Glucose 131, BUN/Cr 13/0.9. EGFR 97. K 4.8. HbA1C 6.2% Chol 93, TG 106, HDL 36, LDL 37  Review of Systems  Cardiovascular:  Positive for chest pain and palpitations. Negative for dyspnea on exertion, leg swelling and syncope.       There were no vitals filed for this visit.    Body mass index is 28.03 kg/m. Filed Weights   06/30/22 1105  Weight: 201 lb (91.2 kg)      Objective:    Physical Exam Vitals and nursing note reviewed.  Constitutional:      Appearance: He is well-developed.  Neck:     Vascular: No JVD.  Cardiovascular:     Rate and Rhythm: Normal rate and regular rhythm.     Pulses: Intact distal pulses.     Heart sounds: Normal heart sounds. No murmur heard. Pulmonary:     Effort: Pulmonary effort is normal.     Breath sounds: Normal breath sounds. No wheezing or rales.  Musculoskeletal:     Right lower leg: No edema.     Left lower leg: No edema.           Assessment & Recommendations:   57 y.o. Caucasian male with hypertension, hyperlipidemia, coronary artery disease, h/o cardiac arrest in 2012 likely from LAD STEMI, treated with primary PCI, h/o seizures, anxiety, depression, BPH, chronic back pain.    CAD: Patent LAD stent, no other significant CAD. (Cath 05/2020). Only mild ischemia on stress testing (05/2022). However, he continues to have recurrent angina symptoms on metoprolol tartrate to 50 mg bid, Imdur 30  mg daily, amlodipine 5 mg daily. Given persistent symptoms, recommend coronary angiography, possible intervention. Schedule for cardiac catheterization, and possible intervention. We discussed regarding risks, benefits, alternatives to this including stress testing, CTA and continued medical therapy. Patient wants to proceed. Understands <1-2% risk of death, stroke, MI, urgent CABG, bleeding, infection, renal failure but not limited to these. Continue aspirin 81 mg, lipitor 80 mg daily, Zetia 10 mg daily.    Palpitations: No significant arrhythmia on cardiac telemetry (05/2022)  Hypertension: Controlled  Mixed hyperlipidemia: Well controlled.  On a separate note, okay to use benadryl for poison [place] rash/itching. Avoid driving If no better, contact PCP.  F/u after cath    Nigel Mormon, MD Pager: [phone] Office: [phone]

## 2022-07-01 NOTE — Interval H&P Note (Signed)
History and Physical Interval Note:  07/01/2022 11:51 AM  Thomas Wilkerson  has presented today for surgery, with the diagnosis of CAD.  The various methods of treatment have been discussed with the patient and family. After consideration of risks, benefits and other options for treatment, the patient has consented to  Procedure(s): LEFT HEART CATH AND CORONARY ANGIOGRAPHY (N/A) as a surgical intervention.  The patient's history has been reviewed, patient examined, no change in status, stable for surgery.  I have reviewed the patient's chart and labs.  Questions were answered to the patient's satisfaction.    2016/2017 Appropriate Use Criteria for Coronary Revascularization Symptom Status: Ischemic Symptoms  Non-invasive Testing: Low risk  If no or indeterminate stress test, FFR/iFR results in all diseased vessels: N/A  Diabetes Mellitus: No  S/P CABG: No  Antianginal therapy (number of long-acting drugs): >=2  Patient undergoing renal transplant: No  Patient undergoing percutaneous valve procedure: No  1 Vessel Disease PCI CABG  No proximal LAD involvement, No proximal left dominant LCX involvement A (7); Indication 1 M (5); Indication 1  Proximal left dominant LCX involvement A (7); Indication 4 A (7); Indication 4  Proximal LAD involvement A (7); Indication 4 A (7); Indication 4  2 Vessel Disease  No proximal LAD involvement A (7); Indication 7 M (6); Indication 7  Proximal LAD involvement A (7); Indication 10 A (7); Indication 10  3 Vessel Disease  Low disease complexity (e.g., focal stenoses, SYNTAX <=22) A (7); Indication 16 A (7); Indication 16  Intermediate or high disease complexity (e.g., SYNTAX >=23) M (6); Indication 20 A (8); Indication 20  Left Main Disease  Isolated LMCA disease: ostial or midshaft A (7); Indication 24 A (9); Indication 24  Isolated LMCA disease: bifurcation involvement M (6); Indication 25 A (9); Indication 25  LMCA ostial or midshaft, concurrent low  disease burden multivessel disease (e.g., 1-2 additional focal stenoses, SYNTAX <=22) A (7); Indication 26 A (9); Indication 26  LMCA ostial or midshaft, concurrent intermediate or high disease burden multivessel disease (e.g., 1-2 additional bifurcation stenoses, long stenoses, SYNTAX >=23) M (4); Indication 27 A (9); Indication 27  LMCA bifurcation involvement, concurrent low disease burden multivessel disease (e.g., 1-2 additional focal stenoses, SYNTAX <=22) M (6); Indication 28 A (9); Indication 28  LMCA bifurcation involvement, concurrent intermediate or high disease burden multivessel disease (e.g., 1-2 additional bifurcation stenoses, long stenoses, SYNTAX >=23) R (3); Indication 29 A (9); Indication [place]

## 2022-07-16 ENCOUNTER — Ambulatory Visit: Payer: No Typology Code available for payment source | Admitting: Cardiology

## 2022-07-16 NOTE — Progress Notes (Deleted)
Subjective:   Thomas Wilkerson, male    DOB: Aug 19, 1965, 57 y.o.   MRN: [id number]  Chief complaint:  Coronary artery disease   57 y.o. Caucasian male with hypertension, hyperlipidemia, coronary artery disease, h/o cardiac arrest in 2012 likely from LAD STEMI, treated with primary PCI, h/o seizures, anxiety, depression, BPH, chronic back pain.   Palpitation have improved, but he continues ot have chest pain episodes couple times a week, usually after physical exertion. Reviewed recent test results with the patient, details below.   On a separate note, he has had exposure to poison Ivy, with rash on his left arm and right upper cheek.   Current Outpatient Medications:  .  amLODipine (NORVASC) 5 MG tablet, Take 1 tablet (5 mg total) by mouth daily., Disp: 30 tablet, Rfl: 0 .  aspirin EC 81 MG tablet, Take 81 mg by mouth daily., Disp: , Rfl:  .  atorvastatin (LIPITOR) 40 MG tablet, Take 40 mg by mouth daily., Disp: , Rfl:  .  Coenzyme Q10 (CO Q10) 100 MG CAPS, Take 100 mg by mouth daily. , Disp: , Rfl:  .  cyclobenzaprine (FLEXERIL) 10 MG tablet, Take 10 mg by mouth 3 (three) times daily as needed for muscle spasms. , Disp: , Rfl:  .  docusate sodium (COLACE) 100 MG capsule, Take 100 mg by mouth 2 (two) times daily., Disp: , Rfl:  .  DULoxetine (CYMBALTA) 30 MG capsule, Take 30 mg by mouth daily., Disp: , Rfl:  .  escitalopram (LEXAPRO) 10 MG tablet, Take 10 mg by mouth daily., Disp: , Rfl:  .  ezetimibe (ZETIA) 10 MG tablet, Take 1 tablet by mouth once daily (Patient taking differently: Take 10 mg by mouth daily.), Disp: 90 tablet, Rfl: 0 .  fluticasone (FLONASE) 50 MCG/ACT nasal spray, Place 2 sprays into both nostrils daily as needed for allergies. , Disp: , Rfl:  .  gabapentin (NEURONTIN) 300 MG capsule, Take 300-600 mg by mouth See admin instructions. Take 300 mg in the morning and 600 mg at bedtime, Disp: , Rfl:  .  ibuprofen (ADVIL) 200 MG tablet, Take 600-800 mg by mouth every 6  (six) hours as needed for moderate pain., Disp: , Rfl:  .  isosorbide mononitrate (IMDUR) 30 MG 24 hr tablet, Take 1 tablet by mouth once daily, Disp: 30 tablet, Rfl: 0 .  lisinopril (ZESTRIL) 5 MG tablet, Take 5 mg by mouth daily., Disp: , Rfl:  .  metoprolol tartrate (LOPRESSOR) 50 MG tablet, Take 1 tablet (50 mg total) by mouth 2 (two) times daily., Disp: 60 tablet, Rfl: 2 .  nitroGLYCERIN (NITROSTAT) 0.4 MG SL tablet, Place 0.4 mg under the tongue every 5 (five) minutes as needed for chest pain., Disp: , Rfl:  .  Omega-3 Fatty Acids (FISH OIL) 1000 MG CAPS, Take 1,000 mg by mouth daily., Disp: , Rfl:  .  omeprazole (PRILOSEC) 20 MG capsule, Take 20 mg by mouth 2 (two) times daily., Disp: , Rfl:  .  Tetrahydrozoline HCl (VISINE OP), Place 1 drop into both eyes daily as needed (dry eyes)., Disp: , Rfl:    Cardiovascular studies:  Mobile cardiac telemetry 6 days 06/05/2022 - 06/12/2022: Dominant rhythm: Sinus. HR 41-128 bpm. Avg HR 64 bpm, in sinus rhythm. 0 episodes of SV. <1% isolated SVE, couplet/triplets. 0 episodes of VT. <1% isolated VE, no couplet/triplets. No atrial fibrillation/atrial flutter/SVT/VT/high grade AV block, sinus pause >3sec noted. 2 patient triggered events, correlated with sinus rhythm. No arrhythmia noted.[name]  Exercise Sestamibi stress test 05/26/2022: Exercise nuclear stress test was performed using Bruce protocol. Patient reached 10.7 METS, and 86% of age predicted maximum heart rate. Exercise capacity was excellent. Non-limiting chest pain reported. Heart rate and hemodynamic response were normal. Stress EKG revealed no ischemic changes. SPECT images showed very small sized, mild intensity, reversible perfusion defect in apical anterior myocardium. Stress LV EF calculated 42%, but visually appears around 50%. Low risk study. Compared to previous study in 2021, mild ischemia is new.   EKG 02/06/2021: EKG Sinus  Rhythm  RSR(V1) -nondiagnostic  Coronary  angiography 05/22/2020: LM: Normal LAD: Patent pLAD stent with no instent-restenosis LCx: normal Ramus: Normal RCA: Normal Normal LVEF and LVEDP  Mobile cardiac telemetry 4 days 01/30/2020 - 02/04/2020: Dominant rhythm: Sinus. HR 47-126 bpm. Avg HR 66 bpm. Rare VE/SVE, burden <1% No atrial fibrillation/atrial flutter/SVT/VT/high grade AV block, sinus pause >3sec noted. 17 patient triggered events, most associated with sinus rhythm/artifact, rare episodes associated with PVC.   Recent labs: 01/09/2022: Glucose 131, BUN/Cr 13/0.9. EGFR 97. K 4.8. HbA1C 6.2% Chol 93, TG 106, HDL 36, LDL 37  Review of Systems  Cardiovascular:  Positive for chest pain and palpitations. Negative for dyspnea on exertion, leg swelling and syncope.       There were no vitals filed for this visit.    There is no height or weight on file to calculate BMI. There were no vitals filed for this visit.     Objective:    Physical Exam Vitals and nursing note reviewed.  Constitutional:      Appearance: He is well-developed.  Neck:     Vascular: No JVD.  Cardiovascular:     Rate and Rhythm: Normal rate and regular rhythm.     Pulses: Intact distal pulses.     Heart sounds: Normal heart sounds. No murmur heard. Pulmonary:     Effort: Pulmonary effort is normal.     Breath sounds: Normal breath sounds. No wheezing or rales.  Musculoskeletal:     Right lower leg: No edema.     Left lower leg: No edema.          Assessment & Recommendations:   57 y.o. Caucasian male with hypertension, hyperlipidemia, coronary artery disease, h/o cardiac arrest in 2012 likely from LAD STEMI, treated with primary PCI, h/o seizures, anxiety, depression, BPH, chronic back pain.    CAD: Patent LAD stent, no other significant CAD. (Cath 05/2020). Only mild ischemia on stress testing (05/2022). However, he continues to have recurrent angina symptoms on metoprolol tartrate to 50 mg bid, Imdur 30 mg daily, amlodipine  5 mg daily. Given persistent symptoms, recommend coronary angiography, possible intervention. Schedule for cardiac catheterization, and possible intervention. We discussed regarding risks, benefits, alternatives to this including stress testing, CTA and continued medical therapy. Patient wants to proceed. Understands <1-2% risk of death, stroke, MI, urgent CABG, bleeding, infection, renal failure but not limited to these. Continue aspirin 81 mg, lipitor 80 mg daily, Zetia 10 mg daily.    Palpitations: No significant arrhythmia on cardiac telemetry (05/2022)  Hypertension: Controlled  Mixed hyperlipidemia: Well controlled.  On a separate note, okay to use benadryl for poison [place] rash/itching. Avoid driving If no better, contact PCP.  F/u after cath    Nigel Mormon, MD Pager: [phone] Office: [phone]

## 2022-07-18 ENCOUNTER — Ambulatory Visit: Payer: No Typology Code available for payment source | Admitting: Cardiology

## 2022-07-18 ENCOUNTER — Encounter: Payer: Self-pay | Admitting: Cardiology

## 2022-07-18 VITALS — BP 109/63 | HR 55 | Resp 16 | Ht 71.0 in | Wt 198.6 lb

## 2022-07-18 DIAGNOSIS — I25118 Atherosclerotic heart disease of native coronary artery with other forms of angina pectoris: Secondary | ICD-10-CM

## 2022-07-18 DIAGNOSIS — I2081 Angina pectoris with coronary microvascular dysfunction: Secondary | ICD-10-CM

## 2022-07-18 MED ORDER — NEBIVOLOL HCL 10 MG PO TABS: 10.0000 mg | ORAL_TABLET | Freq: Every day | ORAL | 2 refills | Status: DC

## 2022-07-18 NOTE — Progress Notes (Signed)
Subjective:   Thomas Wilkerson, male    DOB: 21-Apr-1966, 57 y.o.   MRN: [id number]  Chief complaint:  Coronary artery disease   57 y.o. Caucasian male with hypertension, hyperlipidemia, coronary artery disease, h/o cardiac arrest in 2012 likely from LAD STEMI, treated with primary PCI, h/o seizures, anxiety, depression, BPH, chronic back pain.   Reviewed coronary angiogram results. Patient is increasing his physical activity, now rides a stationary bicycle as well. He still has episdoes of chest pain and exertional fatigue.    Current Outpatient Medications:    amLODipine (NORVASC) 5 MG tablet, Take 1 tablet (5 mg total) by mouth daily., Disp: 30 tablet, Rfl: 0   aspirin EC 81 MG tablet, Take 81 mg by mouth daily., Disp: , Rfl:    atorvastatin (LIPITOR) 40 MG tablet, Take 40 mg by mouth daily., Disp: , Rfl:    Coenzyme Q10 (CO Q10) 100 MG CAPS, Take 100 mg by mouth daily. , Disp: , Rfl:    cyclobenzaprine (FLEXERIL) 10 MG tablet, Take 10 mg by mouth 3 (three) times daily as needed for muscle spasms. , Disp: , Rfl:    docusate sodium (COLACE) 100 MG capsule, Take 100 mg by mouth 2 (two) times daily., Disp: , Rfl:    DULoxetine (CYMBALTA) 30 MG capsule, Take 30 mg by mouth daily., Disp: , Rfl:    escitalopram (LEXAPRO) 10 MG tablet, Take 10 mg by mouth daily., Disp: , Rfl:    ezetimibe (ZETIA) 10 MG tablet, Take 1 tablet by mouth once daily (Patient taking differently: Take 10 mg by mouth daily.), Disp: 90 tablet, Rfl: 0   fluticasone (FLONASE) 50 MCG/ACT nasal spray, Place 2 sprays into both nostrils daily as needed for allergies. , Disp: , Rfl:    gabapentin (NEURONTIN) 300 MG capsule, Take 300-600 mg by mouth See admin instructions. Take 300 mg in the morning and 600 mg at bedtime, Disp: , Rfl:    ibuprofen (ADVIL) 200 MG tablet, Take 600-800 mg by mouth every 6 (six) hours as needed for moderate pain., Disp: , Rfl:    isosorbide mononitrate (IMDUR) 30 MG 24 hr tablet, Take 1 tablet by  mouth once daily, Disp: 30 tablet, Rfl: 0   lisinopril (ZESTRIL) 5 MG tablet, Take 5 mg by mouth daily., Disp: , Rfl:    metoprolol tartrate (LOPRESSOR) 50 MG tablet, Take 1 tablet (50 mg total) by mouth 2 (two) times daily., Disp: 60 tablet, Rfl: 2   nitroGLYCERIN (NITROSTAT) 0.4 MG SL tablet, Place 0.4 mg under the tongue every 5 (five) minutes as needed for chest pain., Disp: , Rfl:    Omega-3 Fatty Acids (FISH OIL) 1000 MG CAPS, Take 1,000 mg by mouth daily., Disp: , Rfl:    omeprazole (PRILOSEC) 20 MG capsule, Take 20 mg by mouth 2 (two) times daily., Disp: , Rfl:    Tetrahydrozoline HCl (VISINE OP), Place 1 drop into both eyes daily as needed (dry eyes)., Disp: , Rfl:    Cardiovascular studies:  Coronary angiography 07/01/2022: LM: Normal LAD: Patent prox LAD stent, no restenosis Lcx: No significant disease RCA: No significant disease   RFR 0.92 (Normal >0.89) FFR 0.87 (Normal >0.80) CFR 2.1 (Normal >2.0) IMR 38 (Normal <25)   No significant epicardial coronary artery disease Elevated IMR suggests microvascular dysfunction  EKG 07/18/2022: Sinus rhythm 51 bpm  Mobile cardiac telemetry 6 days 06/05/2022 - 06/12/2022: Dominant rhythm: Sinus. HR 41-128 bpm. Avg HR 64 bpm, in sinus rhythm. 0 episodes of  SV. <1% isolated SVE, couplet/triplets. 0 episodes of VT. <1% isolated VE, no couplet/triplets. No atrial fibrillation/atrial flutter/SVT/VT/high grade AV block, sinus pause >3sec noted. 2 patient triggered events, correlated with sinus rhythm. No arrhythmia noted..   Exercise Sestamibi stress test 05/26/2022: Exercise nuclear stress test was performed using Bruce protocol. Patient reached 10.7 METS, and 86% of age predicted maximum heart rate. Exercise capacity was excellent. Non-limiting chest pain reported. Heart rate and hemodynamic response were normal. Stress EKG revealed no ischemic changes. SPECT images showed very small sized, mild intensity, reversible perfusion defect  in apical anterior myocardium. Stress LV EF calculated 42%, but visually appears around 50%. Low risk study. Compared to previous study in 2021, mild ischemia is new.   Coronary angiography 05/22/2020: LM: Normal LAD: Patent pLAD stent with no instent-restenosis LCx: normal Ramus: Normal RCA: Normal Normal LVEF and LVEDP  Mobile cardiac telemetry 4 days 01/30/2020 - 02/04/2020: Dominant rhythm: Sinus. HR 47-126 bpm. Avg HR 66 bpm. Rare VE/SVE, burden <1% No atrial fibrillation/atrial flutter/SVT/VT/high grade AV block, sinus pause >3sec noted. 17 patient triggered events, most associated with sinus rhythm/artifact, rare episodes associated with PVC.   Recent labs: 01/09/2022: Glucose 131, BUN/Cr 13/0.9. EGFR 97. K 4.8. HbA1C 6.2% Chol 93, TG 106, HDL 36, LDL 37  Review of Systems  Cardiovascular:  Positive for chest pain and palpitations. Negative for dyspnea on exertion, leg swelling and syncope.       Vitals:   07/18/22 1313  BP: 109/63  Pulse: (!) 55  Resp: 16  SpO2: 97%      Body mass index is 27.7 kg/m. Filed Weights   07/18/22 1313  Weight: 198 lb 9.6 oz (90.1 kg)       Objective:    Physical Exam Vitals and nursing note reviewed.  Constitutional:      Appearance: He is well-developed.  Neck:     Vascular: No JVD.  Cardiovascular:     Rate and Rhythm: Normal rate and regular rhythm.     Pulses: Intact distal pulses.     Heart sounds: Normal heart sounds. No murmur heard. Pulmonary:     Effort: Pulmonary effort is normal.     Breath sounds: Normal breath sounds. No wheezing or rales.  Musculoskeletal:     Right lower leg: No edema.     Left lower leg: No edema.           Assessment & Recommendations:   57 y.o. Caucasian male with hypertension, hyperlipidemia, CAD s/p PPCI to LAD (2012) with patent stent (2024), coronary microvascular dysfunction (IMR 38), h/o seizures, anxiety, depression, BPH, chronic back pain.    CAD: Mild ischemia  on stress testing (05/2022).  Patent LAD stent, no other significant CAD. (Cath 06/2022). IMR 38 s/o coronary microvascular dysfunction. Continue Aspirin, statin, Zetia,  lisinopril, Imdur. Change metoprolol to vasodilatory beta blocker Bystolic 10 mg daily.  Increase physical activity.  Palpitations: No significant arrhythmia on cardiac telemetry (05/2022).  Hypertension: Controlled  Mixed hyperlipidemia: Well controlled.  F/u in 3 months    Nigel Mormon, MD Pager: [phone] Office: [phone]

## 2022-07-31 ENCOUNTER — Telehealth: Payer: Self-pay

## 2022-07-31 NOTE — Telephone Encounter (Signed)
Called patient to inform him about the message above and informed him the we have done a PA on the Bystolic and if it does not get approved the we will send carvedilol 6.25 BID. Patient understood.

## 2022-07-31 NOTE — Telephone Encounter (Signed)
Patient says he is still taking metoprolol due to pharmacy needing a prior authorization. We are initiating a prior authorization. Patient is also asking if there is another medication recommendation besides Bystolic that he can take.

## 2022-07-31 NOTE — Telephone Encounter (Signed)
If Bystolic is not approved, please send carvedilol 6.25 mg bid 180 pillsX3 refills.  Thanks MJP

## 2022-08-01 ENCOUNTER — Other Ambulatory Visit: Payer: Self-pay

## 2022-08-01 MED ORDER — CARVEDILOL 6.25 MG PO TABS: 6.2500 mg | ORAL_TABLET | Freq: Two times a day (BID) | ORAL | 0 refills | Status: DC

## 2022-08-05 NOTE — Telephone Encounter (Signed)
Patient called to inform us that got an allergic reaction to Carvedilol 6.25. patient mention he could his dose Sunday BID and Monday morning only did not take it last night nor this morning. Patient mention his face felt hot, swelling, fatigue,rash on back and feet, and was red.

## 2022-08-05 NOTE — Telephone Encounter (Signed)
Please stop coreg and reapply to insurance for bystolic with the above information.  Thanks MJP

## 2022-08-06 NOTE — Telephone Encounter (Signed)
PA submitted with new info

## 2022-08-08 ENCOUNTER — Other Ambulatory Visit: Payer: Self-pay

## 2022-08-08 MED ORDER — NEBIVOLOL HCL 10 MG PO TABS: 10.0000 mg | ORAL_TABLET | Freq: Every day | ORAL | 3 refills | Status: DC

## 2022-08-13 ENCOUNTER — Telehealth: Payer: Self-pay

## 2022-08-13 ENCOUNTER — Other Ambulatory Visit: Payer: Self-pay

## 2022-08-13 MED ORDER — METOPROLOL TARTRATE 50 MG PO TABS: 50.0000 mg | ORAL_TABLET | Freq: Two times a day (BID) | ORAL | 3 refills | Status: DC

## 2022-08-13 NOTE — Telephone Encounter (Signed)
He already tried coreg and had a reaction also

## 2022-08-13 NOTE — Telephone Encounter (Signed)
Patient calling thinks nebivolol is causing his severe chest pain, took 1 nitro and it helped, wonders if he can go back to metoprolol? He is leaving for [place] tomorrow

## 2022-08-13 NOTE — Telephone Encounter (Signed)
Okay to go back to metoprolol for now. Can retry nebivolol after he returns from [place].  Thanks MJP

## 2022-08-13 NOTE — Telephone Encounter (Signed)
Why dont we try carvedilol instead? He can go back to metoprolol now and try carvedilol after his return from [place].   Thanks MJP

## 2022-08-13 NOTE — Telephone Encounter (Signed)
done 

## 2022-10-12 NOTE — Progress Notes (Deleted)
Subjective:   Thomas Wilkerson, male    DOB: May 27, 1966, 57 y.o.   MRN: [id number]  Chief complaint:  Coronary artery disease   57 y.o. Caucasian male with hypertension, hyperlipidemia, coronary artery disease, h/o cardiac arrest in 2012 likely from LAD STEMI, treated with primary PCI, h/o seizures, anxiety, depression, BPH, chronic back pain.   Palpitation have improved, but he continues ot have chest pain episodes couple times a week, usually after physical exertion. Reviewed recent test results with the patient, details below.   On a separate note, he has had exposure to poison Ivy, with rash on his left arm and right upper cheek.   Current Outpatient Medications:    amLODipine (NORVASC) 5 MG tablet, Take 1 tablet (5 mg total) by mouth daily., Disp: 30 tablet, Rfl: 0   aspirin EC 81 MG tablet, Take 81 mg by mouth daily., Disp: , Rfl:    atorvastatin (LIPITOR) 40 MG tablet, Take 40 mg by mouth daily., Disp: , Rfl:    Coenzyme Q10 (CO Q10) 100 MG CAPS, Take 100 mg by mouth daily. , Disp: , Rfl:    cyclobenzaprine (FLEXERIL) 10 MG tablet, Take 10 mg by mouth 3 (three) times daily as needed for muscle spasms. , Disp: , Rfl:    docusate sodium (COLACE) 100 MG capsule, Take 100 mg by mouth 2 (two) times daily., Disp: , Rfl:    DULoxetine (CYMBALTA) 30 MG capsule, Take 30 mg by mouth daily., Disp: , Rfl:    escitalopram (LEXAPRO) 10 MG tablet, Take 10 mg by mouth daily., Disp: , Rfl:    ezetimibe (ZETIA) 10 MG tablet, Take 1 tablet by mouth once daily (Patient taking differently: Take 10 mg by mouth daily.), Disp: 90 tablet, Rfl: 0   fluticasone (FLONASE) 50 MCG/ACT nasal spray, Place 2 sprays into both nostrils daily as needed for allergies. , Disp: , Rfl:    gabapentin (NEURONTIN) 300 MG capsule, Take 300-600 mg by mouth See admin instructions. Take 300 mg in the morning and 600 mg at bedtime, Disp: , Rfl:    ibuprofen (ADVIL) 200 MG tablet, Take 600-800 mg by mouth every 6 (six) hours as  needed for moderate pain., Disp: , Rfl:    isosorbide mononitrate (IMDUR) 30 MG 24 hr tablet, Take 1 tablet by mouth once daily, Disp: 30 tablet, Rfl: 0   lisinopril (ZESTRIL) 5 MG tablet, Take 5 mg by mouth daily., Disp: , Rfl:    metoprolol tartrate (LOPRESSOR) 50 MG tablet, Take 1 tablet (50 mg total) by mouth 2 (two) times daily., Disp: 60 tablet, Rfl: 3   nitroGLYCERIN (NITROSTAT) 0.4 MG SL tablet, Place 0.4 mg under the tongue every 5 (five) minutes as needed for chest pain., Disp: , Rfl:    Omega-3 Fatty Acids (FISH OIL) 1000 MG CAPS, Take 1,000 mg by mouth daily., Disp: , Rfl:    omeprazole (PRILOSEC) 20 MG capsule, Take 20 mg by mouth 2 (two) times daily., Disp: , Rfl:    Tetrahydrozoline HCl (VISINE OP), Place 1 drop into both eyes daily as needed (dry eyes)., Disp: , Rfl:    Cardiovascular studies:  Coronary angiography 07/01/2022: LM: Normal LAD: Patent prox LAD stent, no restenosis Lcx: No significant disease RCA: No significant disease   RFR 0.92 (Normal >0.89) FFR 0.87 (Normal >0.80) CFR 2.1 (Normal >2.0) IMR 38 (Normal <25)   No significant epicardial coronary artery disease Elevated IMR suggests microvascular dysfunction  Mobile cardiac telemetry 6 days 06/05/2022 - 06/12/2022:  Dominant rhythm: Sinus. HR 41-128 bpm. Avg HR 64 bpm, in sinus rhythm. 0 episodes of SV. <1% isolated SVE, couplet/triplets. 0 episodes of VT. <1% isolated VE, no couplet/triplets. No atrial fibrillation/atrial flutter/SVT/VT/high grade AV block, sinus pause >3sec noted. 2 patient triggered events, correlated with sinus rhythm. No arrhythmia noted..   Exercise Sestamibi stress test 05/26/2022: Exercise nuclear stress test was performed using Bruce protocol. Patient reached 10.7 METS, and 86% of age predicted maximum heart rate. Exercise capacity was excellent. Non-limiting chest pain reported. Heart rate and hemodynamic response were normal. Stress EKG revealed no ischemic changes. SPECT  images showed very small sized, mild intensity, reversible perfusion defect in apical anterior myocardium. Stress LV EF calculated 42%, but visually appears around 50%. Low risk study. Compared to previous study in 2021, mild ischemia is new.    Recent labs: 01/09/2022: Glucose 131, BUN/Cr 13/0.9. EGFR 97. K 4.8. HbA1C 6.2% Chol 93, TG 106, HDL 36, LDL 37  Review of Systems  Cardiovascular:  Positive for chest pain and palpitations. Negative for dyspnea on exertion, leg swelling and syncope.       There were no vitals filed for this visit.   There is no height or weight on file to calculate BMI. There were no vitals filed for this visit.     Objective:    Physical Exam Vitals and nursing note reviewed.  Constitutional:      Appearance: He is well-developed.  Neck:     Vascular: No JVD.  Cardiovascular:     Rate and Rhythm: Normal rate and regular rhythm.     Pulses: Intact distal pulses.     Heart sounds: Normal heart sounds. No murmur heard. Pulmonary:     Effort: Pulmonary effort is normal.     Breath sounds: Normal breath sounds. No wheezing or rales.  Musculoskeletal:     Right lower leg: No edema.     Left lower leg: No edema.           Assessment & Recommendations:   57 y.o. Caucasian male with hypertension, hyperlipidemia, coronary artery disease, h/o cardiac arrest in 2012 likely from LAD STEMI, treated with primary PCI, h/o seizures, anxiety, depression, BPH, chronic back pain.    CAD: Patent LAD stent, no other significant CAD. (Cath 06/2022). Mild microvascular dysfunction (IMR 38). Did not tolerate nebivolol. *** ***Imdur 30 mg daily, amlodipine 5 mg daily. ***Continue aspirin 81 mg, lipitor 80 mg daily, Zetia 10 mg daily.    ***Palpitations: No significant arrhythmia on cardiac telemetry (05/2022)  Hypertension: Controlled  Mixed hyperlipidemia: Well controlled.  F/u ***    Elder Negus, MD Pager: [phone] Office:  [phone]

## 2022-10-13 ENCOUNTER — Encounter: Payer: Medicare Other | Admitting: Cardiology

## 2022-10-13 DIAGNOSIS — E782 Mixed hyperlipidemia: Secondary | ICD-10-CM

## 2022-10-13 DIAGNOSIS — I10 Essential (primary) hypertension: Secondary | ICD-10-CM

## 2022-10-13 DIAGNOSIS — I25118 Atherosclerotic heart disease of native coronary artery with other forms of angina pectoris: Secondary | ICD-10-CM

## 2022-10-16 ENCOUNTER — Ambulatory Visit: Payer: Medicare Other | Admitting: Cardiology

## 2022-12-05 ENCOUNTER — Ambulatory Visit: Payer: Medicare Other | Admitting: Cardiology

## 2022-12-10 ENCOUNTER — Encounter: Payer: Self-pay | Admitting: Cardiology

## 2022-12-10 ENCOUNTER — Ambulatory Visit: Payer: Medicare Other | Admitting: Cardiology

## 2022-12-10 VITALS — BP 116/72 | HR 60 | Ht 71.0 in | Wt 200.0 lb

## 2022-12-10 DIAGNOSIS — E782 Mixed hyperlipidemia: Secondary | ICD-10-CM

## 2022-12-10 DIAGNOSIS — I10 Essential (primary) hypertension: Secondary | ICD-10-CM

## 2022-12-10 DIAGNOSIS — E7800 Pure hypercholesterolemia, unspecified: Secondary | ICD-10-CM

## 2022-12-10 DIAGNOSIS — I25118 Atherosclerotic heart disease of native coronary artery with other forms of angina pectoris: Secondary | ICD-10-CM

## 2022-12-10 MED ORDER — LISINOPRIL 5 MG PO TABS: 5.0000 mg | ORAL_TABLET | Freq: Every day | ORAL | 3 refills | Status: DC

## 2022-12-10 MED ORDER — METOPROLOL TARTRATE 50 MG PO TABS: 50.0000 mg | ORAL_TABLET | Freq: Two times a day (BID) | ORAL | 3 refills | Status: DC

## 2022-12-10 MED ORDER — ASPIRIN 81 MG PO TBEC: 81.0000 mg | DELAYED_RELEASE_TABLET | Freq: Every day | ORAL | 3 refills | Status: DC

## 2022-12-10 MED ORDER — AMLODIPINE BESYLATE 5 MG PO TABS: 5.0000 mg | ORAL_TABLET | Freq: Every day | ORAL | 3 refills | Status: DC

## 2022-12-10 MED ORDER — ATORVASTATIN CALCIUM 40 MG PO TABS: 40.0000 mg | ORAL_TABLET | Freq: Every day | ORAL | 3 refills | Status: DC

## 2022-12-10 MED ORDER — EZETIMIBE 10 MG PO TABS: 10.0000 mg | ORAL_TABLET | Freq: Every day | ORAL | 3 refills | Status: DC

## 2022-12-10 NOTE — Progress Notes (Signed)
Subjective:   Thomas Wilkerson, male    DOB: 1965/12/24, 57 y.o.   MRN: [id number]  Chief complaint:  Coronary artery disease   57 y.o. Caucasian male with hypertension, hyperlipidemia, coronary artery disease, h/o cardiac arrest in 2012 likely from LAD STEMI, treated with primary PCI, h/o seizures, anxiety, depression, BPH, chronic back pain.   Patient has had no worsening symptoms, which include chest pain, usually at rest once every couple weeks, along with palpitations.  He does not have any exertional symptoms.  He is currently working on building a swimming pool without much difficulty.   Current Outpatient Medications:    amLODipine (NORVASC) 5 MG tablet, Take 1 tablet (5 mg total) by mouth daily., Disp: 30 tablet, Rfl: 0   aspirin EC 81 MG tablet, Take 81 mg by mouth daily., Disp: , Rfl:    atorvastatin (LIPITOR) 40 MG tablet, Take 40 mg by mouth daily., Disp: , Rfl:    Coenzyme Q10 (CO Q10) 100 MG CAPS, Take 100 mg by mouth daily. , Disp: , Rfl:    cyclobenzaprine (FLEXERIL) 10 MG tablet, Take 10 mg by mouth 3 (three) times daily as needed for muscle spasms. , Disp: , Rfl:    docusate sodium (COLACE) 100 MG capsule, Take 100 mg by mouth 2 (two) times daily., Disp: , Rfl:    DULoxetine (CYMBALTA) 30 MG capsule, Take 30 mg by mouth daily., Disp: , Rfl:    escitalopram (LEXAPRO) 10 MG tablet, Take 10 mg by mouth daily., Disp: , Rfl:    ezetimibe (ZETIA) 10 MG tablet, Take 1 tablet by mouth once daily (Patient taking differently: Take 10 mg by mouth daily.), Disp: 90 tablet, Rfl: 0   fluticasone (FLONASE) 50 MCG/ACT nasal spray, Place 2 sprays into both nostrils daily as needed for allergies. , Disp: , Rfl:    gabapentin (NEURONTIN) 300 MG capsule, Take 300-600 mg by mouth See admin instructions. Take 300 mg in the morning and 600 mg at bedtime, Disp: , Rfl:    ibuprofen (ADVIL) 200 MG tablet, Take 600-800 mg by mouth every 6 (six) hours as needed for moderate pain., Disp: , Rfl:     isosorbide mononitrate (IMDUR) 30 MG 24 hr tablet, Take 1 tablet by mouth once daily, Disp: 30 tablet, Rfl: 0   lisinopril (ZESTRIL) 5 MG tablet, Take 5 mg by mouth daily., Disp: , Rfl:    metoprolol tartrate (LOPRESSOR) 50 MG tablet, Take 1 tablet (50 mg total) by mouth 2 (two) times daily., Disp: 60 tablet, Rfl: 3   nitroGLYCERIN (NITROSTAT) 0.4 MG SL tablet, Place 0.4 mg under the tongue every 5 (five) minutes as needed for chest pain., Disp: , Rfl:    Omega-3 Fatty Acids (FISH OIL) 1000 MG CAPS, Take 1,000 mg by mouth daily., Disp: , Rfl:    omeprazole (PRILOSEC) 20 MG capsule, Take 20 mg by mouth 2 (two) times daily., Disp: , Rfl:    Tetrahydrozoline HCl (VISINE OP), Place 1 drop into both eyes daily as needed (dry eyes)., Disp: , Rfl:    Cardiovascular studies:  EKG 12/10/2022: Sinus rhythm 54 bpm Normal EKG  Coronary angiography 07/01/2022: LM: Normal LAD: Patent prox LAD stent, no restenosis Lcx: No significant disease RCA: No significant disease   RFR 0.92 (Normal >0.89) FFR 0.87 (Normal >0.80) CFR 2.1 (Normal >2.0) IMR 38 (Normal <25)   No significant epicardial coronary artery disease Elevated IMR suggests microvascular dysfunction  Mobile cardiac telemetry 6 days 06/05/2022 - 06/12/2022: Dominant  rhythm: Sinus. HR 41-128 bpm. Avg HR 64 bpm, in sinus rhythm. 0 episodes of SV. <1% isolated SVE, couplet/triplets. 0 episodes of VT. <1% isolated VE, no couplet/triplets. No atrial fibrillation/atrial flutter/SVT/VT/high grade AV block, sinus pause >3sec noted. 2 patient triggered events, correlated with sinus rhythm. No arrhythmia noted..   Exercise Sestamibi stress test 05/26/2022: Exercise nuclear stress test was performed using Bruce protocol. Patient reached 10.7 METS, and 86% of age predicted maximum heart rate. Exercise capacity was excellent. Non-limiting chest pain reported. Heart rate and hemodynamic response were normal. Stress EKG revealed no ischemic  changes. SPECT images showed very small sized, mild intensity, reversible perfusion defect in apical anterior myocardium. Stress LV EF calculated 42%, but visually appears around 50%. Low risk study. Compared to previous study in 2021, mild ischemia is new.    Recent labs: 06/30/2022: Glucose 103, BUN/Cr 12/0.94. EGFR 95. Na/K 142/4.5.  H/H 14/43. MCV 97. Platelets 328  01/09/2022: Glucose 131, BUN/Cr 13/0.9. EGFR 97. K 4.8. HbA1C 6.2% Chol 93, TG 106, HDL 36, LDL 37  Review of Systems  Cardiovascular:  Positive for chest pain and palpitations. Negative for dyspnea on exertion, leg swelling and syncope.       Vitals:   12/10/22 1413  BP: 116/72  Pulse: 60  SpO2: 97%     Body mass index is 27.89 kg/m. Filed Weights   12/10/22 1413  Weight: 200 lb (90.7 kg)      Objective:    Physical Exam Vitals and nursing note reviewed.  Constitutional:      Appearance: He is well-developed.  Neck:     Vascular: No JVD.  Cardiovascular:     Rate and Rhythm: Normal rate and regular rhythm.     Pulses: Intact distal pulses.     Heart sounds: Normal heart sounds. No murmur heard. Pulmonary:     Effort: Pulmonary effort is normal.     Breath sounds: Normal breath sounds. No wheezing or rales.  Musculoskeletal:     Right lower leg: No edema.     Left lower leg: No edema.           Assessment & Recommendations:   57 y.o. Caucasian male with hypertension, hyperlipidemia, coronary artery disease, h/o cardiac arrest in 2012 likely from LAD STEMI, treated with primary PCI, h/o seizures, anxiety, depression, BPH, chronic back pain.    CAD: Patent LAD stent, no other significant CAD. (Cath 06/2022). Mild microvascular dysfunction (IMR 38). Did not tolerate nebivolol or carvedilol, thus back on metoprolol 50 mg twice daily. Also on Imdur 30 mg daily, amlodipine 5 mg daily. Continue aspirin 81 mg, lipitor 80 mg daily, Zetia 10 mg daily.    Palpitations: No significant  arrhythmia on cardiac telemetry (05/2022)  Hypertension: Controlled  Mixed hyperlipidemia: Well controlled.  F/u in 6 months    Elder Negus, MD Pager: [phone] Office: [phone]

## 2023-02-18 ENCOUNTER — Telehealth: Payer: Self-pay | Admitting: Gastroenterology

## 2023-02-18 NOTE — Telephone Encounter (Signed)
Hi Dr. Tomasa Rand,  We received a referral for patient to have a colonoscopy. The patient is specifically requesting you to possibly continue his GI care. He does have GI history from [place]. Had a colonoscopy in 2019, his records were obtained and scanned into [employer] for you to review and advise on scheduling.   Thank you

## 2023-02-19 NOTE — Progress Notes (Signed)
This encounter was created in error - please disregard.

## 2023-03-11 NOTE — Telephone Encounter (Signed)
Called patient to advise. Left voicemail.

## 2023-04-06 ENCOUNTER — Telehealth: Payer: Self-pay

## 2023-04-06 ENCOUNTER — Ambulatory Visit (AMBULATORY_SURGERY_CENTER): Payer: Medicare Other

## 2023-04-06 VITALS — Ht 71.0 in | Wt 198.0 lb

## 2023-04-06 DIAGNOSIS — Z860100 Personal history of colon polyps, unspecified: Secondary | ICD-10-CM

## 2023-04-06 MED ORDER — PEG 3350-KCL-NA BICARB-NACL 420 G PO SOLR: 4000.0000 mL | Freq: Once | ORAL | 0 refills | Status: AC

## 2023-04-06 NOTE — Progress Notes (Signed)
No egg or soy allergy known to patient  No issues known to pt with past sedation with any surgeries or procedures Patient denies ever being told they had issues or difficulty with intubation  No FH of Malignant Hyperthermia Pt is not on diet pills Pt is not on  home 02  Pt is not on blood thinners  Pt denies issues with constipation  No A fib or A flutter Have any cardiac testing pending--no  LOA: independent  Prep: 2 day golytely   Patient's chart reviewed by Cathlyn Parsons CNRA prior to previsit and patient appropriate for the [facility].  Previsit completed and red dot placed by patient's name on their procedure day (on provider's schedule).     PV competed with patient. Prep instructions sent via mychart and home address.

## 2023-04-06 NOTE — Telephone Encounter (Signed)
PV in progress  

## 2023-05-07 ENCOUNTER — Ambulatory Visit: Payer: 59 | Admitting: Gastroenterology

## 2023-05-07 ENCOUNTER — Encounter: Payer: Self-pay | Admitting: Gastroenterology

## 2023-05-07 VITALS — BP 96/54 | HR 51 | Temp 98.6°F | Resp 9 | Ht 71.0 in | Wt 201.0 lb

## 2023-05-07 DIAGNOSIS — Z1211 Encounter for screening for malignant neoplasm of colon: Secondary | ICD-10-CM

## 2023-05-07 DIAGNOSIS — Z860101 Personal history of adenomatous and serrated colon polyps: Secondary | ICD-10-CM

## 2023-05-07 DIAGNOSIS — K5289 Other specified noninfective gastroenteritis and colitis: Secondary | ICD-10-CM | POA: Diagnosis not present

## 2023-05-07 DIAGNOSIS — D125 Benign neoplasm of sigmoid colon: Secondary | ICD-10-CM | POA: Diagnosis not present

## 2023-05-07 DIAGNOSIS — K6389 Other specified diseases of intestine: Secondary | ICD-10-CM

## 2023-05-07 DIAGNOSIS — K922 Gastrointestinal hemorrhage, unspecified: Secondary | ICD-10-CM | POA: Diagnosis not present

## 2023-05-07 DIAGNOSIS — Z860100 Personal history of colon polyps, unspecified: Secondary | ICD-10-CM

## 2023-05-07 DIAGNOSIS — K635 Polyp of colon: Secondary | ICD-10-CM | POA: Diagnosis not present

## 2023-05-07 MED ORDER — SODIUM CHLORIDE 0.9 % IV SOLN: 500.0000 mL | Freq: Once | INTRAVENOUS | Status: DC

## 2023-05-07 NOTE — Progress Notes (Signed)
Pt's states no medical or surgical changes since previsit or office visit. 

## 2023-05-07 NOTE — Op Note (Signed)
[facility] Patient Name: Thomas Wilkerson Procedure Date: 05/07/2023 8:32 AM MRN: [id number] Endoscopist: Lorin Picket E. Tomasa Rand , MD, [id number] Age: 57 Referring MD:  Date of Birth: 1965-10-01 Gender: Male Account #: [id number] Procedure:                Colonoscopy Indications:              High risk colon cancer surveillance: Personal                            history of non-advanced adenoma, Last colonoscopy 5                            years ago Medicines:                Monitored Anesthesia Care Procedure:                Pre-Anesthesia Assessment:                           - Prior to the procedure, a History and Physical                            was performed, and patient medications and                            allergies were reviewed. The patient's tolerance of                            previous anesthesia was also reviewed. The risks                            and benefits of the procedure and the sedation                            options and risks were discussed with the patient.                            All questions were answered, and informed consent                            was obtained. Prior Anticoagulants: The patient has                            taken no anticoagulant or antiplatelet agents                            except for aspirin. ASA Grade Assessment: III - A                            patient with severe systemic disease. After                            reviewing the risks and benefits, the patient was  deemed in satisfactory condition to undergo the                            procedure.                           After obtaining informed consent, the colonoscope                            was passed under direct vision. Throughout the                            procedure, the patient's blood pressure, pulse, and                            oxygen saturations were monitored continuously. The                             Olympus Scope SN [id number] was introduced through the                            anus and advanced to the the terminal ileum, with                            identification of the appendiceal orifice and IC                            valve. The colonoscopy was performed without                            difficulty. The patient tolerated the procedure                            well. The quality of the bowel preparation was                            adequate. The terminal ileum, ileocecal valve,                            appendiceal orifice, and rectum were photographed.                            The bowel preparation used was GoLYTELY via                            extended prep with split dose instruction. Scope In: 8:39:08 AM Scope Out: 9:04:39 AM Scope Withdrawal Time: 0 hours 18 minutes 46 seconds  Total Procedure Duration: 0 hours 25 minutes 31 seconds  Findings:                 The perianal and digital rectal examinations were                            normal. Pertinent negatives include normal  sphincter tone and no palpable rectal lesions.                           A 5 mm polyp was found in the sigmoid colon. The                            polyp was sessile. The polyp was removed with a                            cold snare. Resection and retrieval were complete.                            Estimated blood loss was minimal.                           Localized moderate inflammation characterized by                            erythema, friability, mucus and shallow ulcerations                            was found at the splenic flexure. Biopsies were                            taken with a cold forceps for histology. Estimated                            blood loss was minimal.                           The exam was otherwise normal throughout the                            examined colon.                           The terminal ileum appeared normal.                            The retroflexed view of the distal rectum and anal                            verge was normal and showed no anal or rectal                            abnormalities. Complications:            No immediate complications. Estimated Blood Loss:     Estimated blood loss was minimal. Impression:               - One 5 mm polyp in the sigmoid colon, removed with                            a cold snare. Resected and retrieved.                           -  Localized moderate inflammation was found at the                            splenic flexure secondary to ischemic colitis.                            Biopsied.                           - The examined portion of the ileum was normal.                           - The distal rectum and anal verge are normal on                            retroflexion view. Recommendation:           - Patient has a contact number available for                            emergencies. The signs and symptoms of potential                            delayed complications were discussed with the                            patient. Return to normal activities tomorrow.                            Written discharge instructions were provided to the                            patient.                           - Resume previous diet.                           - Continue present medications.                           - Await pathology results.                           - Repeat colonoscopy (date not yet determined) for                            surveillance based on pathology results.                           - No specific therapy recommended for suspected                            ischemic colitis.                           - Avoid NSAIDs for the next 2  weeks while colitis                            heals.                           - Important to stay hydrated. Recommend avoiding                            strenuous physical activity for the next  1-2 weeks. Sayge Brienza E. Tomasa Rand, MD 05/07/2023 9:12:20 AM This report has been signed electronically.

## 2023-05-07 NOTE — Patient Instructions (Addendum)
Repeat colonoscopy (date not yet determined) for surveillance based on pathology results. No specific therapy recommended for suspected ischemic colitis. Avoid NSAIDs for the next 2 weeks while colitis heals. Important to stay hydrated. Recommend avoiding strenuous physical activity for the next 1-2 weeks                           . Please read over handout provided  YOU HAD AN ENDOSCOPIC PROCEDURE TODAY AT THE [facility]:   Refer to the procedure report that was given to you for any specific questions about what was found during the examination.  If the procedure report does not answer your questions, please call your gastroenterologist to clarify.  If you requested that your care partner not be given the details of your procedure findings, then the procedure report has been included in a sealed envelope for you to review at your convenience later.  YOU SHOULD EXPECT: Some feelings of bloating in the abdomen. Passage of more gas than usual.  Walking can help get rid of the air that was put into your GI tract during the procedure and reduce the bloating. If you had a lower endoscopy (such as a colonoscopy or flexible sigmoidoscopy) you may notice spotting of blood in your stool or on the toilet paper. If you underwent a bowel prep for your procedure, you may not have a normal bowel movement for a few days.  Please Note:  You might notice some irritation and congestion in your nose or some drainage.  This is from the oxygen used during your procedure.  There is no need for concern and it should clear up in a day or so.  SYMPTOMS TO REPORT IMMEDIATELY:  Following lower endoscopy (colonoscopy or flexible sigmoidoscopy):  Excessive amounts of blood in the stool  Significant tenderness or worsening of abdominal pains  Swelling of the abdomen that is new, acute  Fever of 100F or higher  For urgent or emergent issues, a gastroenterologist can be reached at any hour by calling [phone]  [phone]. Do not use MyChart messaging for urgent concerns.    DIET:  We do recommend a small meal at first, but then you may proceed to your regular diet.  Drink plenty of fluids but you should avoid alcoholic beverages for 24 hours.  ACTIVITY:  You should plan to take it easy for the rest of today and you should NOT DRIVE or use heavy machinery until tomorrow (because of the sedation medicines used during the test).    FOLLOW UP: Our staff will call the number listed on your records the next business day following your procedure.  We will call around 7:15- 8:00 am to check on you and address any questions or concerns that you may have regarding the information given to you following your procedure. If we do not reach you, we will leave a message.     If any biopsies were taken you will be contacted by phone or by letter within the next 1-3 weeks.  Please call us at [phone] if you have not heard about the biopsies in 3 weeks.    SIGNATURES/CONFIDENTIALITY: You and/or your care partner have signed paperwork which will be entered into your electronic medical record.  These signatures attest to the fact that that the information above on your After Visit Summary has been reviewed and is understood.  Full responsibility of the confidentiality of this discharge information lies with you and/or  your care-partner.

## 2023-05-07 NOTE — Progress Notes (Signed)
Sedate, gd SR, tolerated procedure well, VSS, report to RN 

## 2023-05-07 NOTE — Progress Notes (Signed)
[facility] History and Physical   Primary Care Physician:  Henrine Screws, MD   Reason for Procedure:   History of colon polyps  Plan:    Surveillance colonoscopy     HPI: Thomas Wilkerson is a 57 y.o. male undergoing surveillance colonoscopy.  He has no family history of colon cancer and no chronic GI symptoms but recently has been having some intermittent left lower quadrant pain.  He had a colonoscopy in 2019 in which two small polyps were removed, 1 tubular adenoma and one hyperplastic polyp.  He was recommended to repeat in 5 years.   Past Medical History:  Diagnosis Date   Cardiac arrest [facility]) 2012   Coronary artery disease with stable angina pectoris (HCC) 01/25/2019   GERD (gastroesophageal reflux disease) 01/25/2019   HLD (hyperlipidemia) 01/25/2019   HTN (hypertension) 01/25/2019   Seizure disorder (HCC) 01/25/2019    Past Surgical History:  Procedure Laterality Date   ANKLE SURGERY Right    CORONARY PRESSURE/FFR STUDY N/A 07/01/2022   Procedure: INTRAVASCULAR PRESSURE WIRE/FFR STUDY;  Surgeon: Elder Negus, MD;  Location: [facility] INVASIVE CV LAB;  Service: Cardiovascular;  Laterality: N/A;   ELBOW SURGERY Left    LEFT HEART CATH AND CORONARY ANGIOGRAPHY N/A 05/22/2020   Procedure: LEFT HEART CATH AND CORONARY ANGIOGRAPHY;  Surgeon: Elder Negus, MD;  Location: [facility] INVASIVE CV LAB;  Service: Cardiovascular;  Laterality: N/A;   LEFT HEART CATH AND CORONARY ANGIOGRAPHY N/A 07/01/2022   Procedure: LEFT HEART CATH AND CORONARY ANGIOGRAPHY;  Surgeon: Elder Negus, MD;  Location: [facility] INVASIVE CV LAB;  Service: Cardiovascular;  Laterality: N/A;   STENT PLACEMENT VASCULAR ([facility] HX)      Prior to Admission medications   Medication Sig Start Date End Date Taking? Authorizing Provider  amLODipine (NORVASC) 5 MG tablet Take 1 tablet (5 mg total) by mouth daily. 12/10/22  Yes Patwardhan, Anabel Bene, MD  aspirin EC 81 MG tablet Take 1 tablet (81 mg total)  by mouth daily. Swallow whole. 12/10/22  Yes Patwardhan, Manish J, MD  atorvastatin (LIPITOR) 40 MG tablet Take 1 tablet (40 mg total) by mouth daily. 12/10/22  Yes Patwardhan, Manish J, MD  Coenzyme Q10 (CO Q10) 100 MG CAPS Take 100 mg by mouth daily.    Yes [provider]  cyclobenzaprine (FLEXERIL) 10 MG tablet Take 10 mg by mouth 3 (three) times daily as needed for muscle spasms.    Yes [provider]  docusate sodium (COLACE) 100 MG capsule Take 100 mg by mouth 2 (two) times daily.   Yes [provider]  DULoxetine (CYMBALTA) 30 MG capsule Take 30 mg by mouth daily.   Yes [provider]  escitalopram (LEXAPRO) 10 MG tablet Take 10 mg by mouth daily.   Yes [provider]  ezetimibe (ZETIA) 10 MG tablet Take 1 tablet (10 mg total) by mouth daily. 12/10/22  Yes Patwardhan, Manish J, MD  gabapentin (NEURONTIN) 300 MG capsule Take 300-600 mg by mouth See admin instructions. Take 300 mg in the morning and 600 mg at bedtime   Yes [provider]  isosorbide dinitrate (ISORDIL) 10 MG tablet Take 10 mg by mouth 2 (two) times daily.   Yes [provider]  lisinopril (ZESTRIL) 5 MG tablet Take 1 tablet (5 mg total) by mouth daily. 12/10/22  Yes Patwardhan, Manish J, MD  metoprolol tartrate (LOPRESSOR) 50 MG tablet Take 1 tablet (50 mg total) by mouth 2 (two) times daily. 12/10/22  Yes Patwardhan,  Manish J, MD  Omega-3 Fatty Acids (FISH OIL) 1000 MG CAPS Take 1,000 mg by mouth daily.   Yes [provider]  omeprazole (PRILOSEC) 20 MG capsule Take 20 mg by mouth 2 (two) times daily.   Yes [provider]  fluticasone (FLONASE) 50 MCG/ACT nasal spray Place 2 sprays into both nostrils daily as needed for allergies.     [provider]  ibuprofen (ADVIL) 200 MG tablet Take 600-800 mg by mouth every 6 (six) hours as needed for moderate pain.    [provider]  nitroGLYCERIN (NITROSTAT) 0.4 MG SL tablet Place 0.4 mg  under the tongue every 5 (five) minutes as needed for chest pain.    [provider]  Tetrahydrozoline HCl (VISINE OP) Place 1 drop into both eyes daily as needed (dry eyes).    [provider]    Current Outpatient Medications  Medication Sig Dispense Refill   amLODipine (NORVASC) 5 MG tablet Take 1 tablet (5 mg total) by mouth daily. 90 tablet 3   aspirin EC 81 MG tablet Take 1 tablet (81 mg total) by mouth daily. Swallow whole. 90 tablet 3   atorvastatin (LIPITOR) 40 MG tablet Take 1 tablet (40 mg total) by mouth daily. 90 tablet 3   Coenzyme Q10 (CO Q10) 100 MG CAPS Take 100 mg by mouth daily.      cyclobenzaprine (FLEXERIL) 10 MG tablet Take 10 mg by mouth 3 (three) times daily as needed for muscle spasms.      docusate sodium (COLACE) 100 MG capsule Take 100 mg by mouth 2 (two) times daily.     DULoxetine (CYMBALTA) 30 MG capsule Take 30 mg by mouth daily.     escitalopram (LEXAPRO) 10 MG tablet Take 10 mg by mouth daily.     ezetimibe (ZETIA) 10 MG tablet Take 1 tablet (10 mg total) by mouth daily. 90 tablet 3   gabapentin (NEURONTIN) 300 MG capsule Take 300-600 mg by mouth See admin instructions. Take 300 mg in the morning and 600 mg at bedtime     isosorbide dinitrate (ISORDIL) 10 MG tablet Take 10 mg by mouth 2 (two) times daily.     lisinopril (ZESTRIL) 5 MG tablet Take 1 tablet (5 mg total) by mouth daily. 90 tablet 3   metoprolol tartrate (LOPRESSOR) 50 MG tablet Take 1 tablet (50 mg total) by mouth 2 (two) times daily. 180 tablet 3   Omega-3 Fatty Acids (FISH OIL) 1000 MG CAPS Take 1,000 mg by mouth daily.     omeprazole (PRILOSEC) 20 MG capsule Take 20 mg by mouth 2 (two) times daily.     fluticasone (FLONASE) 50 MCG/ACT nasal spray Place 2 sprays into both nostrils daily as needed for allergies.      ibuprofen (ADVIL) 200 MG tablet Take 600-800 mg by mouth every 6 (six) hours as needed for moderate pain.     nitroGLYCERIN (NITROSTAT) 0.4 MG SL tablet Place 0.4  mg under the tongue every 5 (five) minutes as needed for chest pain.     Tetrahydrozoline HCl (VISINE OP) Place 1 drop into both eyes daily as needed (dry eyes).     Current Facility-Administered Medications  Medication Dose Route Frequency Provider Last Rate Last Admin   0.9 %  sodium chloride infusion  500 mL Intravenous Once Jenel Lucks, MD        Allergies as of 05/07/2023 - Review Complete 05/07/2023  Allergen Reaction Noted   Acetaminophen Itching and Rash 12/22/2012  Latex Itching, Other (See Comments), and Rash 03/12/2007   Oxycodone-acetaminophen Hives and Itching 09/12/2015   Codeine  04/06/2023   Tape Dermatitis 05/16/2020   Carvacrol Rash 08/08/2022    Family History  Problem Relation Age of Onset   Colon polyps Mother    Diabetes Mother    Hypertension Mother    Kidney disease Father    Colon cancer Neg Hx    Esophageal cancer Neg Hx    Stomach cancer Neg Hx    Rectal cancer Neg Hx     Social History   Socioeconomic History   Marital status: Married    Spouse name: Not on file   Number of children: 3   Years of education: Not on file   Highest education level: Not on file  Occupational History   Not on file  Tobacco Use   Smoking status: Former    Current packs/day: 0.00    Average packs/day: 1 pack/day for 20.0 years (20.0 ttl pk-yrs)    Types: Cigarettes, Cigars    Start date: 40    Quit date: 2012    Years since quitting: 12.8   Smokeless tobacco: Never   Tobacco comments:    Smokes occasional cigars  Vaping Use   Vaping status: Never Used  Substance and Sexual Activity   Alcohol use: Yes    Comment: occ   Drug use: Never   Sexual activity: Not on file  Other Topics Concern   Not on file  Social History Narrative   Not on file   Social Determinants of Health   Financial Resource Strain: Not on file  Food Insecurity: Not on file  Transportation Needs: Not on file  Physical Activity: Not on file  Stress: Not on file   Social Connections: Unknown (10/28/2021)   Received from [facility]   Social Network    Social Network: Not on file  Intimate Partner Violence: Unknown (11/06/2021)   [facility]   Intimate Partner Violence    Fear of Current or Ex-Partner: Not on file    Review of Systems:  All other review of systems negative except as mentioned in the HPI.  Physical Exam: Vital signs BP 107/62   Pulse 65   Temp 98.6 F (37 C) (Temporal)   Ht 5\' 11"  (1.803 m)   Wt 201 lb (91.2 kg)   SpO2 96%   BMI 28.03 kg/m   General:   Alert,  Well-developed, well-nourished, pleasant and cooperative in NAD Airway:  Mallampati 2 Lungs:  Clear throughout to auscultation.   Heart:  Regular rate and rhythm; no murmurs, clicks, rubs,  or gallops. Abdomen:  Soft, nontender and nondistended. Normal bowel sounds.   Neuro/Psych:  Normal mood and affect. A and O x 3   Keiosha Cancro E. Tomasa Rand, MD [facility]

## 2023-05-07 NOTE — Progress Notes (Signed)
Called to room to assist during endoscopic procedure.  Patient ID and intended procedure confirmed with present staff. Received instructions for my participation in the procedure from the performing physician.  

## 2023-05-08 ENCOUNTER — Telehealth: Payer: Self-pay | Admitting: *Deleted

## 2023-05-08 NOTE — Telephone Encounter (Signed)
  Follow up Call-     05/07/2023    7:44 AM  Call back number  Post procedure Call Back phone  # [phone]  Permission to leave phone message Yes     Patient questions:  Do you have a fever, pain , or abdominal swelling? No. Pain Score  0 *  Have you tolerated food without any problems? Yes.    Have you been able to return to your normal activities? Yes.    Do you have any questions about your discharge instructions: Diet   No. Medications  No. Follow up visit  No.  Do you have questions or concerns about your Care? No.  Actions: * If pain score is 4 or above: No action needed, pain <4.

## 2023-05-11 LAB — SURGICAL PATHOLOGY

## 2023-05-12 NOTE — Progress Notes (Signed)
Mr. Thomas Wilkerson, The polyp removed from your colon was a hyperplastic polyp.  These polyps are not considered precancerous.  Given your history of a precancerous polyp on a previous colonoscopy, I would recommend you repeat colonoscopy in 7 years.  The biopsies of the inflamed area of your colon were suggestive of ischemic colitis.  As discussed, based on the appearance and location of the inflammation, I think this is the correct diagnosis.  There is no specific therapy recommended for ischemic colitis, but I would recommend you avoid NSAIDs if possible.  This inflammation will heal on its own.  Please be careful to avoid drops in blood pressure and significant dehydration, as this can increase your risk for recurrent episodes of ischemic colitis.

## 2023-05-18 ENCOUNTER — Telehealth: Payer: Self-pay | Admitting: Gastroenterology

## 2023-05-18 ENCOUNTER — Other Ambulatory Visit: Payer: Self-pay | Admitting: Family Medicine

## 2023-05-18 DIAGNOSIS — F17210 Nicotine dependence, cigarettes, uncomplicated: Secondary | ICD-10-CM

## 2023-05-18 NOTE — Telephone Encounter (Signed)
Inbound call from patient stating since his 11/21 colonoscopy he has been having frequent LUQ abdominal pain. States last night he started vomiting and did not stop until a few hours later. Patient is requesting a call back to discuss further. Please advise, thank you.

## 2023-05-18 NOTE — Telephone Encounter (Signed)
11/21 colon completed and began to have cramping in the upper left abd when eating last night with vomiting about every hour for several hours.  Very small hard stools yesterday.No rectal bleeding.  Since 3 am no further vomiting or stools.  He has had water only since 6 pm last night.  Continues to have nausea but no further vomiting.  No sick contacts that he is aware of. He will remain on liquids for now and message sent to Dr Tomasa Rand.

## 2023-05-20 ENCOUNTER — Encounter: Payer: Self-pay | Admitting: Gastroenterology

## 2023-05-20 NOTE — Telephone Encounter (Signed)
Will await further communication from pt.

## 2023-05-21 ENCOUNTER — Ambulatory Visit (INDEPENDENT_AMBULATORY_CARE_PROVIDER_SITE_OTHER): Payer: Medicare Other

## 2023-05-21 DIAGNOSIS — F17210 Nicotine dependence, cigarettes, uncomplicated: Secondary | ICD-10-CM | POA: Diagnosis not present

## 2023-05-21 DIAGNOSIS — Z122 Encounter for screening for malignant neoplasm of respiratory organs: Secondary | ICD-10-CM

## 2023-06-04 ENCOUNTER — Other Ambulatory Visit: Payer: Self-pay

## 2023-06-04 DIAGNOSIS — K559 Vascular disorder of intestine, unspecified: Secondary | ICD-10-CM

## 2023-06-04 DIAGNOSIS — R109 Unspecified abdominal pain: Secondary | ICD-10-CM

## 2023-06-11 ENCOUNTER — Ambulatory Visit: Payer: Self-pay | Admitting: Cardiology

## 2023-06-23 ENCOUNTER — Ambulatory Visit (INDEPENDENT_AMBULATORY_CARE_PROVIDER_SITE_OTHER): Payer: 59

## 2023-06-23 ENCOUNTER — Encounter: Payer: Self-pay | Admitting: Cardiology

## 2023-06-23 ENCOUNTER — Ambulatory Visit: Payer: 59 | Attending: Cardiology | Admitting: Cardiology

## 2023-06-23 VITALS — BP 116/80 | HR 56 | Resp 16 | Ht 71.0 in | Wt 207.0 lb

## 2023-06-23 DIAGNOSIS — I25118 Atherosclerotic heart disease of native coronary artery with other forms of angina pectoris: Secondary | ICD-10-CM | POA: Diagnosis not present

## 2023-06-23 DIAGNOSIS — R002 Palpitations: Secondary | ICD-10-CM

## 2023-06-23 DIAGNOSIS — E782 Mixed hyperlipidemia: Secondary | ICD-10-CM

## 2023-06-23 DIAGNOSIS — I2510 Atherosclerotic heart disease of native coronary artery without angina pectoris: Secondary | ICD-10-CM | POA: Diagnosis not present

## 2023-06-23 DIAGNOSIS — R072 Precordial pain: Secondary | ICD-10-CM | POA: Diagnosis not present

## 2023-06-23 MED ORDER — LISINOPRIL 5 MG PO TABS: 5.0000 mg | ORAL_TABLET | Freq: Every day | ORAL | 3 refills | Status: DC

## 2023-06-23 MED ORDER — METOPROLOL TARTRATE 50 MG PO TABS: 50.0000 mg | ORAL_TABLET | Freq: Two times a day (BID) | ORAL | 3 refills | Status: DC

## 2023-06-23 MED ORDER — ATORVASTATIN CALCIUM 40 MG PO TABS: 40.0000 mg | ORAL_TABLET | Freq: Every day | ORAL | 3 refills | Status: AC

## 2023-06-23 MED ORDER — ASPIRIN 81 MG PO TBEC: 81.0000 mg | DELAYED_RELEASE_TABLET | Freq: Every day | ORAL | Status: AC

## 2023-06-23 MED ORDER — EZETIMIBE 10 MG PO TABS: 10.0000 mg | ORAL_TABLET | Freq: Every day | ORAL | 3 refills | Status: AC

## 2023-06-23 MED ORDER — ISOSORBIDE DINITRATE 10 MG PO TABS: 10.0000 mg | ORAL_TABLET | Freq: Two times a day (BID) | ORAL | 3 refills | Status: DC

## 2023-06-23 MED ORDER — AMLODIPINE BESYLATE 5 MG PO TABS: 5.0000 mg | ORAL_TABLET | Freq: Every day | ORAL | 3 refills | Status: AC

## 2023-06-23 NOTE — Patient Instructions (Signed)
 Medication Instructions:   Your physician recommends that you continue on your current medications as directed. Please refer to the Current Medication list given to you today.  *If you need a refill on your cardiac medications before your next appointment, please call your pharmacy*     Testing/Procedures:  Your physician has recommended that you wear an event monitor. Event monitors are medical devices that record the heart's electrical activity. Doctors most often us  these monitors to diagnose arrhythmias. Arrhythmias are problems with the speed or rhythm of the heartbeat. The monitor is a small, portable device. You can wear one while you do your normal daily activities. This is usually used to diagnose what is causing palpitations/syncope (passing out).     CARDIAC PET SCAN    Please report to Radiology at the [facility] Main Entrance 30 minutes early for your test.  [address] [place], [place] [address]                          How to Prepare for Your Cardiac PET/CT Stress Test:  Nothing to eat or drink, except water, 3 hours prior to arrival time.  NO caffeine/decaffeinated products, or chocolate 12 hours prior to arrival. (Please note decaffeinated beverages (teas/coffees) still contain caffeine).  If you have caffeine within 12 hours prior, the test will need to be rescheduled.  Medication instructions: Do not take erectile dysfunction medications for 72 hours prior to test (sildenafil, tadalafil) Do not take nitrates (isosorbide  mononitrate, Ranexa) the day before or day of test Do not take tamsulosin the day before or morning of test Hold theophylline containing medications for 12 hours. Hold Dipyridamole 48 hours prior to the test.  Diabetic Preparation: If able to eat breakfast prior to 3 hour fasting, you may take all medications, including your insulin. Do not worry if you miss your breakfast dose of insulin - start at your next meal. If you do not  eat prior to 3 hour fast-Hold all diabetes (oral and insulin) medications. Patients who wear a continuous glucose monitor MUST remove the device prior to scanning.  You may take your remaining medications with water.  NO perfume, cologne or lotion on chest or abdomen area. FEMALES - Please avoid wearing dresses to this appointment.  Total time is 1 to 2 hours; you may want to bring reading material for the waiting time.      What to Expect When you Arrive:  Once you arrive and check in for your appointment, you will be taken to a preparation room within the Radiology Department.  A technologist or Nurse will obtain your medical history, verify that you are correctly prepped for the exam, and explain the procedure.  Afterwards, an IV will be started in your arm and electrodes will be placed on your skin for EKG monitoring during the stress portion of the exam. Then you will be escorted to the PET/CT scanner.  There, staff will get you positioned on the scanner and obtain a blood pressure and EKG.  During the exam, you will continue to be connected to the EKG and blood pressure machines.  A small, safe amount of a radioactive tracer will be injected in your IV to obtain a series of pictures of your heart along with an injection of a stress agent.    After your Exam:  It is recommended that you eat a meal and drink a caffeinated beverage to counter act any effects of  the stress agent.  Drink plenty of fluids for the remainder of the day and urinate frequently for the first couple of hours after the exam.  Your doctor will inform you of your test results within 7-10 business days.  For more information and frequently asked questions, please visit our website: [email or web address]  For questions about your test or how to prepare for your test, please call: Cardiac Imaging Nurse Navigators Office: [phone]    Follow-Up:  3 MONTHS WITH DR. PATWARDHAN OR AN EXTENDER

## 2023-06-23 NOTE — Progress Notes (Signed)
 Cardiology Office Note:  .   Date:  06/23/2023  ID:  Thomas Wilkerson, DOB January 16, 1966, MRN [id number] PCP: Thomas Charleston, MD  Thomas Wilkerson Cardiologist:  Thomas Lawrence, MD PCP: Thomas Charleston, MD  Chief Complaint  Patient presents with   Coronary artery disease of native artery of native heart wi   Chest Pain   Follow-up      History of Present Illness: .    Thomas Wilkerson is a 58 y.o. male with hypertension, hyperlipidemia, coronary artery disease, h/o cardiac arrest in 2012 likely from LAD STEMI, treated with primary PCI, h/o seizures, anxiety, depression, BPH, chronic back pain.   Lately, he has been having recurrent symptoms of palpitations almost nightly.  These episodes are associated with chest pain.  In addition, he also has occasional episodes of exertional chest pain.  Chest pain does improve with sublingual nitroglycerin .  Separately, patient has been having left lower quadrant abdominal pain for which she has been seeing Dr. Stacia at [facility] gastroenterology.  He underwent a colonoscopy that reportedly showed findings suggestive of ischemic colitis.  He is going to undergo CT angiogram tomorrow for the same.      Vitals:   06/23/23 0850  BP: 116/80  Pulse: (!) 56  Resp: 16  SpO2: 94%     ROS:  Review of Systems  Cardiovascular:  Positive for chest pain and palpitations. Negative for dyspnea on exertion, leg swelling and syncope.     Studies Reviewed: SABRA        EKG 06/23/2023: Sinus rhythm 53 bpm Normal EKG  Coronary angiography 07/01/2022: LM: Normal LAD: Patent prox LAD stent, no restenosis Lcx: No significant disease RCA: No significant disease   RFR 0.92 (Normal >0.89) FFR 0.87 (Normal >0.80) CFR 2.1 (Normal >2.0) IMR 38 (Normal <25)   No significant epicardial coronary artery disease Elevated IMR suggests microvascular dysfunction   Exercise Sestamibi stress test 05/26/2022: Exercise nuclear stress test was performed  using Bruce protocol. Patient reached 10.7 METS, and 86% of age predicted maximum heart rate. Exercise capacity was excellent. Non-limiting chest pain reported. Heart rate and hemodynamic response were normal. Stress EKG revealed no ischemic changes. SPECT images showed very small sized, mild intensity, reversible perfusion defect in apical anterior myocardium. Stress LV EF calculated 42%, but visually appears around 50%. Low risk study. Compared to previous study in 2021, mild ischemia is new.    Independently interpreted 01/2023: Chol 115, TG 196, HDL 33, LDL 50 HbA1C 6.2% Hb 14 Cr 0.9    Physical Exam:   Physical Exam Vitals and nursing note reviewed.  Constitutional:      General: He is not in acute distress. Neck:     Vascular: No JVD.  Cardiovascular:     Rate and Rhythm: Normal rate and regular rhythm.     Heart sounds: Normal heart sounds. No murmur heard. Pulmonary:     Effort: Pulmonary effort is normal.     Breath sounds: Normal breath sounds. No wheezing or rales.  Musculoskeletal:     Right lower leg: No edema.     Left lower leg: No edema.      VISIT DIAGNOSES:   ICD-10-CM   1. Precordial pain  R07.2 EKG 12-Lead    NM PET CT CARDIAC PERFUSION MULTI W/ABSOLUTE BLOODFLOW    2. Coronary artery disease involving native coronary artery of native heart without angina pectoris  I25.10 NM PET CT CARDIAC PERFUSION MULTI W/ABSOLUTE BLOODFLOW    Cardiac Stress Test: Informed Consent  Details: Physician/Practitioner Attestation; Transcribe to consent form and obtain patient signature    3. Palpitations  R00.2 Cardiac event monitor    4. Coronary artery disease of native artery of native heart with stable angina pectoris (HCC)  I25.118 ezetimibe  (ZETIA ) 10 MG tablet    5. Pure hypercholesterolemia  E78.00 ezetimibe  (ZETIA ) 10 MG tablet       ASSESSMENT AND PLAN: .    Thomas Wilkerson is a 58 y.o. male with hypertension, hyperlipidemia, coronary artery disease, h/o  cardiac arrest in 2012 likely from LAD STEMI, treated with primary PCI, h/o seizures, anxiety, depression, BPH, chronic back pain.     CAD: Patent LAD stent, no other significant CAD. (Cath 06/2022). Mild microvascular dysfunction (IMR 38). He did not tolerate nebivolol  or carvedilol , thus back on metoprolol  50 mg twice daily. Change isosorbide  dinitrate to isosorbide  mononitrate 30 mg daily. Continue aspirin  81 mg, amlodipine  5 mg daily, lipitor 80 mg daily, Zetia  10 mg daily.   In addition, will obtain cardiac PET/CT stress test for specific evaluation of ischemia.   Palpitations: Recurrent episodes.  Recent finding of ischemic colitis. Last 2-week Zio patch in 06/2022 did not show any A-fib. Will check 30-day event monitor to look for any A-fib.   Hypertension: Controlled   Mixed hyperlipidemia: Well controlled.    Informed Consent   Shared Decision Making/Informed Consent{ The risks [chest pain, shortness of breath, cardiac arrhythmias, dizziness, blood pressure fluctuations, myocardial infarction, stroke/transient ischemic attack, nausea, vomiting, allergic reaction, radiation exposure, metallic taste sensation and life-threatening complications (estimated to be 1 in 10,000)], benefits (risk stratification, diagnosing coronary artery disease, treatment guidance) and alternatives of a cardiac PET stress test were discussed in detail with Thomas Wilkerson and he agrees to proceed.       Meds ordered this encounter  Medications   metoprolol  tartrate (LOPRESSOR ) 50 MG tablet    Sig: Take 1 tablet (50 mg total) by mouth 2 (two) times daily.    Dispense:  180 tablet    Refill:  3    Please d/c nebivolol    lisinopril  (ZESTRIL ) 5 MG tablet    Sig: Take 1 tablet (5 mg total) by mouth daily.    Dispense:  90 tablet    Refill:  3   isosorbide  dinitrate (ISORDIL ) 10 MG tablet    Sig: Take 1 tablet (10 mg total) by mouth 2 (two) times daily.    Dispense:  60 tablet    Refill:  3    ezetimibe  (ZETIA ) 10 MG tablet    Sig: Take 1 tablet (10 mg total) by mouth daily.    Dispense:  90 tablet    Refill:  3   atorvastatin  (LIPITOR) 40 MG tablet    Sig: Take 1 tablet (40 mg total) by mouth daily.    Dispense:  90 tablet    Refill:  3   amLODipine  (NORVASC ) 5 MG tablet    Sig: Take 1 tablet (5 mg total) by mouth daily.    Dispense:  90 tablet    Refill:  3   aspirin  EC 81 MG tablet    Sig: Take 1 tablet (81 mg total) by mouth daily. Swallow whole.     F/u in 3 months  Signed, Thomas JINNY Lawrence, MD

## 2023-06-23 NOTE — Progress Notes (Unsigned)
 [employer] event monitor serial # [id number] applied to patient.

## 2023-06-24 ENCOUNTER — Ambulatory Visit (HOSPITAL_COMMUNITY)
Admission: RE | Admit: 2023-06-24 | Discharge: 2023-06-24 | Disposition: A | Discharge status: Home / Self Care | Payer: 59 | Source: Ambulatory Visit | Attending: Gastroenterology | Admitting: Gastroenterology

## 2023-06-24 DIAGNOSIS — K559 Vascular disorder of intestine, unspecified: Secondary | ICD-10-CM | POA: Diagnosis present

## 2023-06-24 DIAGNOSIS — R109 Unspecified abdominal pain: Secondary | ICD-10-CM | POA: Insufficient documentation

## 2023-06-24 MED ORDER — SODIUM CHLORIDE (PF) 0.9 % IJ SOLN
INTRAMUSCULAR | Status: AC
  Filled 2023-06-24: qty 50

## 2023-06-24 MED ORDER — IOHEXOL 350 MG/ML SOLN
100.0000 mL | Freq: Once | INTRAVENOUS | Status: AC | PRN
  Administered 2023-06-24: 100 mL via INTRAVENOUS

## 2023-07-01 ENCOUNTER — Other Ambulatory Visit: Payer: Self-pay

## 2023-07-01 DIAGNOSIS — R109 Unspecified abdominal pain: Secondary | ICD-10-CM

## 2023-07-01 NOTE — Progress Notes (Signed)
 Mr. Morss,  Your CT-A was normal.  There was no evidence of any significant narrowing of the arteries feeding your intestines.  Additionally there was no evidence of persistent inflammatory changes as was seen on your colonoscopy.   At this point, I would recommend an office visit to discuss your chronic symptoms more in depth. In the meantime, I would also like to exclude H. Pylori infection as a cause of your pain.  Rock,  Can you please order an H. Pylori stool antigen test and book the next available office visit with me?

## 2023-07-20 ENCOUNTER — Ambulatory Visit (INDEPENDENT_AMBULATORY_CARE_PROVIDER_SITE_OTHER): Payer: 59 | Admitting: Gastroenterology

## 2023-07-20 ENCOUNTER — Encounter: Payer: Self-pay | Admitting: Gastroenterology

## 2023-07-20 VITALS — BP 116/64 | HR 63 | Ht 72.0 in | Wt 200.0 lb

## 2023-07-20 DIAGNOSIS — R1032 Left lower quadrant pain: Secondary | ICD-10-CM | POA: Diagnosis not present

## 2023-07-20 DIAGNOSIS — K219 Gastro-esophageal reflux disease without esophagitis: Secondary | ICD-10-CM

## 2023-07-20 DIAGNOSIS — K5900 Constipation, unspecified: Secondary | ICD-10-CM

## 2023-07-20 DIAGNOSIS — K559 Vascular disorder of intestine, unspecified: Secondary | ICD-10-CM

## 2023-07-20 MED ORDER — MELOXICAM 10 MG PO CAPS: 10.0000 mg | ORAL_CAPSULE | Freq: Every day | ORAL | 0 refills | Status: DC

## 2023-07-20 MED ORDER — OMEPRAZOLE 20 MG PO CPDR: 20.0000 mg | DELAYED_RELEASE_CAPSULE | Freq: Every day | ORAL | 0 refills | Status: DC

## 2023-07-20 NOTE — Progress Notes (Signed)
Discussed the use of AI scribe software for clinical note transcription with the patient, who gave verbal consent to proceed.  HPI : Thomas Wilkerson is a 58 y.o. male with a history of coronary artery disease, hypertension and hyperlipidemia who presents for follow-up of chronic abdominal pain.  I initially met him in November 2024 for a direct colon polyp surveillance colonoscopy.  The colonoscopy was notable for incidental circumferential inflammatory changes in the splenic flexure.  Endoscopic findings were suspicious for ischemic colitis, and biopsies were also suggestive of this.  A small hyperplastic polyp was also removed.  He continued to have ongoing issues with intermittent left lower quadrant pain, which prompted a CTA in January which was unremarkable.  There is no evidence of colon wall thickening or inflammation on that CT scan. He followed up with cardiology because of ongoing issues with chest pain and palpitations.  A PET/cardiac perfusion scan were ordered and a cardiac monitor was ordered given his palpitations and recent ischemic colitis.  The PET scan is scheduled for March, and he just recently turned in his heart monitor, results pending.  Today, the patient reports that he continues to have the same discomfort.  While initially intermittent, the discomfort has become constant and is described as a pulling sensation or feeling like there's something there. Physical activities such as lifting firewood or digging, and certain foods exacerbate the pain.  Coughing and sneezing also make the pain worse.  He rates the pain between 4 and 5 out of 10 most of the time, with episodes reaching 8 or 9, particularly with activities like bending over. The pain also bothers him at night, especially after eating, and is sometimes felt when rolling over in bed.  He experiences nausea, which has led to dietary modifications including eating smaller meals and avoiding certain foods like fibers, acids,  and spices. Despite these changes, nausea persists if he eats too much. He had a recent episode of vomiting, attributed to coughing due to a cold.  He takes Senna regularly to manage bowel movements, which have been more regular, though he experienced diarrhea a few days ago.  Having constipation worsens the pain.  No blood in stool.  He is currently prescribed Prilosec for acid reflux, though he takes it only on an as-needed basis.  His weight has been stable around 200 pounds, though he previously weighed up to 225 pounds before losing weight through biking.  He quit smoking a couple of years ago but occasionally takes a drag from his wife's cigarette. There is a family history of stomach cancer in his grandfather and a history of colon polyps in his mother.       LHC Jan 2024 LM: Normal LAD: Patent prox LAD stent, no restenosis Lcx: No significant disease RCA: No significant disease   RFR 0.92 (Normal >0.89) FFR 0.87 (Normal >0.80) CFR 2.1 (Normal >2.0) IMR 38 (Normal <25)   No significant epicardial coronary artery disease Elevated IMR suggests microvascular dysfunction   Colonoscopy May 07, 2023 - One 5 mm polyp in the sigmoid colon, removed with a cold snare. Resected and retrieved.  - Localized moderate inflammation was found at the splenic flexure secondary to ischemic colitis. Biopsied.  - The examined portion of the ileum was normal.  - The distal rectum and anal verge are normal on retroflexion view - Repeat colonoscopy recommended in 7 years  FINAL DIAGNOSIS       1. Surgical [P], colon, splenic flexure :      -  BENIGN COLONIC MUCOSA WITH ISCHEMIC PATTERN COLITIS.SEE NOTE.       2. Surgical [P], colon, sigmoid, polyp (1) :      - HYPERPLASTIC POLYP.      - NO DYSPLASIA OR MALIGNANCY.       Diagnosis Note : The colon biopsy shows colonic mucosa with hemorrhage within      the lamina propria.The crypts demonstrate superficial withering and regenerative       changes.The features are those of an ischemic-pattern colitis.While these      findings are most common in ischemic colitis, the differential diagnosis is      broad and includes mechanical obstruction (e.g.stercoral ulcer), infections,      vasculitis, and medication effect.Clinical and  correlation is recommended.    CT-A abdomen/pelvis Jun 27, 2023 IMPRESSION: VASCULAR   1. No significant stenosis or occlusion of the visceral arteries to suggest a source for mesenteric ischemia. 2. Trace atherosclerotic calcifications along the aorta. Aortic Atherosclerosis (ICD10-I70.0).   Past Medical History:  Diagnosis Date   Cardiac arrest [facility]) 2012   Coronary artery disease with stable angina pectoris (HCC) 01/25/2019   GERD (gastroesophageal reflux disease) 01/25/2019   HLD (hyperlipidemia) 01/25/2019   HTN (hypertension) 01/25/2019   Seizure disorder (HCC) 01/25/2019     Past Surgical History:  Procedure Laterality Date   ANKLE SURGERY Right    CORONARY PRESSURE/FFR STUDY N/A 07/01/2022   Procedure: INTRAVASCULAR PRESSURE WIRE/FFR STUDY;  Surgeon: Elder Negus, MD;  Location: [facility] INV[facility]IVE [facility];  Service: Cardiovascular;  Laterality: N/A;   ELBOW SURGERY Left    LEFT HEART CATH AND CORONARY ANGIOGRAPHY N/A 05/22/2020   Procedure: LEFT HEART CATH AND CORONARY ANGIOGRAPHY;  Surgeon: Elder Negus, MD;  Location: [facility] INVASIVE CV LAB;  Service: Cardiovascular;  Laterality: N/A;   LEFT HEART CATH AND CORONARY ANGIOGRAPHY N/A 07/01/2022   Procedure: LEFT HEART CATH AND CORONARY ANGIOGRAPHY;  Surgeon: Elder Negus, MD;  Location: [facility] INVASIVE CV LAB;  Service: Cardiovascular;  Laterality: N/A;   STENT PLACEMENT VASCULAR ([facility] HX)     Family History  Problem Relation Age of Onset   Colon polyps Mother    Diabetes Mother    Hypertension Mother    Kidney disease Father    Colon cancer Neg Hx    Esophageal cancer Neg Hx    Stomach cancer Neg Hx    Rectal cancer Neg  Hx    Social History   Tobacco Use   Smoking status: Former    Current packs/day: 0.00    Average packs/day: 1 pack/day for 20.0 years (20.0 ttl pk-yrs)    Types: Cigarettes, Cigars    Start date: 40    Quit date: 2012    Years since quitting: 13.1   Smokeless tobacco: Never   Tobacco comments:    Smokes occasional cigars  Vaping Use   Vaping status: Never Used  Substance Use Topics   Alcohol use: Yes    Comment: occ   Drug use: Never   Current Outpatient Medications  Medication Sig Dispense Refill   amLODipine (NORVASC) 5 MG tablet Take 1 tablet (5 mg total) by mouth daily. 90 tablet 3   aspirin EC 81 MG tablet Take 1 tablet (81 mg total) by mouth daily. Swallow whole.     atorvastatin (LIPITOR) 40 MG tablet Take 1 tablet (40 mg total) by mouth daily. 90 tablet 3   Coenzyme Q10 (CO Q10) 100 MG CAPS Take 100 mg by mouth daily.  cyclobenzaprine (FLEXERIL) 10 MG tablet Take 10 mg by mouth 3 (three) times daily as needed for muscle spasms.      docusate sodium (COLACE) 100 MG capsule Take 100 mg by mouth 2 (two) times daily.     DULoxetine (CYMBALTA) 30 MG capsule Take 30 mg by mouth daily.     escitalopram (LEXAPRO) 10 MG tablet Take 10 mg by mouth daily.     ezetimibe (ZETIA) 10 MG tablet Take 1 tablet (10 mg total) by mouth daily. 90 tablet 3   fluticasone (FLONASE) 50 MCG/ACT nasal spray Place 2 sprays into both nostrils daily as needed for allergies.      gabapentin (NEURONTIN) 300 MG capsule Take 300-600 mg by mouth See admin instructions. Take 300 mg in the morning and 600 mg at bedtime     ibuprofen (ADVIL) 200 MG tablet Take 600-800 mg by mouth every 6 (six) hours as needed for moderate pain.     isosorbide dinitrate (ISORDIL) 10 MG tablet Take 1 tablet (10 mg total) by mouth 2 (two) times daily. 60 tablet 3   lisinopril (ZESTRIL) 5 MG tablet Take 1 tablet (5 mg total) by mouth daily. 90 tablet 3   metoprolol tartrate (LOPRESSOR) 50 MG tablet Take 1 tablet (50 mg  total) by mouth 2 (two) times daily. 180 tablet 3   nitroGLYCERIN (NITROSTAT) 0.4 MG SL tablet Place 0.4 mg under the tongue every 5 (five) minutes as needed for chest pain.     Omega-3 Fatty Acids (FISH OIL) 1000 MG CAPS Take 1,000 mg by mouth daily.     omeprazole (PRILOSEC) 20 MG capsule Take 20 mg by mouth 2 (two) times daily.     Tetrahydrozoline HCl (VISINE OP) Place 1 drop into both eyes daily as needed (dry eyes).     No current facility-administered medications for this visit.   Allergies  Allergen Reactions   Acetaminophen Itching and Rash    Other Reaction(s): tingling, itching, rash   Latex Itching, Other (See Comments) and Rash    Hands peel (happened w/ latex gloves)    Oxycodone-Acetaminophen Hives and Itching    Other Reaction(s): tingling, itching, rash   Codeine     Other Reaction(s): tingling, itching , rash   Tape Dermatitis   Carvacrol Rash     Review of Systems: All systems reviewed and negative except where noted in HPI.    CT Angio Abd/Pel w/ and/or w/o Result Date: 06/27/2023 CLINICAL DATA:  Ischemic colitis EXAM: CTA ABDOMEN AND PELVIS WITHOUT AND WITH CONTRAST TECHNIQUE: Multidetector CT imaging of the abdomen and pelvis was performed using the standard protocol during bolus administration of intravenous contrast. Multiplanar reconstructed images and MIPs were obtained and reviewed to evaluate the vascular anatomy. RADIATION DOSE REDUCTION: This exam was performed according to the departmental dose-optimization program which includes automated exposure control, adjustment of the mA and/or kV according to patient size and/or use of iterative reconstruction technique. CONTRAST:  OMNIPAQUE IOHEXOL 350 MG/ML SOLN COMPARISON:  None Available. FINDINGS: VASCULAR Aorta: Normal caliber aorta without aneurysm, dissection, vasculitis or significant stenosis. Tiny calcified atherosclerotic plaque. Celiac: Patent without evidence of aneurysm, dissection, vasculitis  or significant stenosis. SMA: Patent without evidence of aneurysm, dissection, vasculitis or significant stenosis. Renals: Both renal arteries are patent without evidence of aneurysm, dissection, vasculitis, fibromuscular dysplasia or significant stenosis. IMA: Patent without evidence of aneurysm, dissection, vasculitis or significant stenosis. Inflow: Patent without evidence of aneurysm, dissection, vasculitis or significant stenosis. Proximal Outflow: Bilateral common femoral  and visualized portions of the superficial and profunda femoral arteries are patent without evidence of aneurysm, dissection, vasculitis or significant stenosis. Veins: No focal venous abnormality. Review of the MIP images confirms the above findings. NON-VASCULAR Lower chest: No acute abnormality. Hepatobiliary: No focal liver abnormality is seen. No gallstones, gallbladder wall thickening, or biliary dilatation. Pancreas: Unremarkable. No pancreatic ductal dilatation or surrounding inflammatory changes. Spleen: Normal in size without focal abnormality. Adrenals/Urinary Tract: Adrenal glands are unremarkable. Kidneys are normal, without renal calculi, focal lesion, or hydronephrosis. Bladder is unremarkable. Stomach/Bowel: Stomach is within normal limits. Appendix appears normal. No evidence of bowel wall thickening, distention, or inflammatory changes. Lymphatic: No suspicious lymphadenopathy. Reproductive: Prostate is unremarkable. Other: No abdominal wall hernia or abnormality. No abdominopelvic ascites. Musculoskeletal: No acute or significant osseous findings. IMPRESSION: VASCULAR 1. No significant stenosis or occlusion of the visceral arteries to suggest a source for mesenteric ischemia. 2. Trace atherosclerotic calcifications along the aorta. Aortic Atherosclerosis (ICD10-I70.0). NON-VASCULAR 1. No acute abnormality within the abdomen or pelvis. Electronically Signed   By: Malachy Moan M.D.   On: 06/27/2023 08:14    Physical  Exam: BP 116/64   Pulse 63   Ht 6' (1.829 m)   Wt 200 lb (90.7 kg)   BMI 27.12 kg/m  Constitutional: Pleasant,well-developed, Caucasian male in no acute distress. HEENT: Normocephalic and atraumatic. Conjunctivae are normal. No scleral icterus. Neck supple.  Cardiovascular: Normal rate, regular rhythm.  Pulmonary/chest: Effort normal and breath sounds normal. No wheezing, rales or rhonchi. Abdominal: Soft, nondistended, focal abdominal pain in the left lower quadrant.  Pain is worsened with palpation during engagement of abdominis rectus muscles (positive Carnett's sign). Bowel sounds active throughout. There are no masses palpable. No hepatomegaly. Extremities: no edema Neurological: Alert and oriented to person place and time. Skin: Skin is warm and dry. No rashes noted. Psychiatric: Normal mood and affect. Behavior is normal.  CBC    Component Value Date/Time   WBC 7.9 06/30/2022 1131   RBC 4.47 06/30/2022 1131   HGB 14.6 06/30/2022 1131   HCT 43.4 06/30/2022 1131   PLT 328 06/30/2022 1131   MCV 97 06/30/2022 1131   MCH 32.7 06/30/2022 1131   MCHC 33.6 06/30/2022 1131   RDW 12.3 06/30/2022 1131    CMP     Component Value Date/Time   NA 142 06/30/2022 1131   K 4.5 06/30/2022 1131   CL 105 06/30/2022 1131   CO2 23 06/30/2022 1131   GLUCOSE 103 (H) 06/30/2022 1131   BUN 12 06/30/2022 1131   CREATININE 0.94 06/30/2022 1131   CALCIUM 10.0 06/30/2022 1131   GFRNONAA 77 05/18/2020 0829   GFRAA 89 05/18/2020 0829       Latest Ref Rng & Units 06/30/2022   11:31 AM 05/18/2020    8:29 AM  CBC EXTENDED  WBC 3.4 - 10.8 x10E3/uL 7.9  6.7   RBC 4.14 - 5.80 x10E6/uL 4.47  4.75   Hemoglobin 13.0 - 17.7 g/dL 09.8  11.9   HCT 14.7 - 51.0 % 43.4  46.0   Platelets 150 - 450 x10E3/uL 328  329       ASSESSMENT AND PLAN:  58 year old male with chronic left-sided abdominal pain, now persisting several months.  Pain is present almost all the time.  It is worsened with physical  activities and movements including coughing and sneezing.  It is also associated with nausea and occasional vomiting, and is worsened with constipation.  The exacerbation of his symptoms with physical activities  and sneezing is highly suggestive of a musculoskeletal disorder, but the association of nausea and vomiting and worsening with constipation is more suggestive of a visceral source.  The patient has a positive Carnett's sign on exam, also suggesting a musculoskeletal/abdominal wall source.  Abdominal Wall Pain Syndrome Chronic left-sided abdominal pain since late summer/early fall, exacerbated by physical activity and certain foods. Pain is now constant, severity 4-5/10, peaking at 8-9/10, worsens with bending, lifting, and strenuous activities, associated with nausea and occasional vomiting. Positive Carnett's sign suggests an abdominal wall source, possibly nerve entrapment. Differential includes musculoskeletal injury and gastrointestinal causes. CT scan showed no significant GI vessel narrowing; ischemic colitis found on colonoscopy unlikely to be related to his pain. Discussed meloxicam for pain management of suspected abdominal wall pain. Advised Prilosec daily while on meloxicam to protect the stomach.  We discussed the role of abdominal wall injection if pain persists despite trial of NSAID.  We also discussed potential upper endoscopy given this new onset pain and associated symptoms of nausea and vomiting.  With his pending cardiac PET scan and recent cardiac monitor, I would want his cardiology evaluation/follow-up to be completed before scheduling him for an elective sedated procedure. - Prescribe meloxicam 10 mg p.o. daily for 2-3 weeks - Advise Prilosec daily while on meloxicam - Reassess pain in a few weeks via MyChart - Consider upper endoscopy if pain persists post-cardiac evaluation - Consider abdominal wall injection if pain persists  Ischemic Colitis Inflammation in the colon  consistent with ischemic colitis found during colonoscopy in November. No history of acute symptoms like intense pain followed by bloody stools.  Subsequent CT scan showed no significant GI vessel narrowing and no evidence of colon wall thickening. - Monitor for acute symptoms -Await results of cardiac monitor to assess for A-fib -Kake to continue daily senna to avoid constipation, which may increase risk of recurrent ischemic colitis  Constipation -Continue daily senna  Gastroesophageal Reflux Disease (GERD) GERD managed with Prilosec, currently taken as needed. Reduced frequency due to concerns about long-term use and potential dementia risk. Advised Prilosec daily while on meloxicam to protect the stomach. - Advise Prilosec daily while on meloxicam  Thomas Wilkerson E. Tomasa Rand, MD [facility]  I spent a total of 39 minutes reviewing the patient's medical record, interviewing and examining the patient, discussing his diagnosis and management of his condition going forward, and documenting in the medical record   Henrine Screws, MD

## 2023-07-20 NOTE — Patient Instructions (Addendum)
We have sent the following medications to your pharmacy for you to pick up at your convenience: Omeprazole, Meloxicam  _______________________________________________________  If your blood pressure at your visit was 140/90 or greater, please contact your primary care physician to follow up on this.  _______________________________________________________  If you are age 59 or older, your body mass index should be between 23-30. Your Body mass index is 27.12 kg/m. If this is out of the aforementioned range listed, please consider follow up with your Primary Care Provider.  If you are age 27 or younger, your body mass index should be between 19-25. Your Body mass index is 27.12 kg/m. If this is out of the aformentioned range listed, please consider follow up with your Primary Care Provider.   ________________________________________________________  The [facility] providers would like to encourage you to use [facility] to communicate with providers for non-urgent requests or questions.  Due to long hold times on the telephone, sending your provider a message by [facility] may be a faster and more efficient way to get a response.  Please allow 48 business hours for a response.  Please remember that this is for non-urgent requests.  _______________________________________________________   Thank you for entrusting me with your care and choosing [facility].  Dr Tomasa Rand

## 2023-07-23 ENCOUNTER — Encounter: Payer: Self-pay | Admitting: Gastroenterology

## 2023-07-23 ENCOUNTER — Other Ambulatory Visit: Payer: Self-pay

## 2023-07-23 MED ORDER — MELOXICAM 7.5 MG PO TABS: 7.5000 mg | ORAL_TABLET | Freq: Every day | ORAL | 0 refills | Status: AC

## 2023-07-30 ENCOUNTER — Encounter: Payer: Self-pay | Admitting: Cardiology

## 2023-08-07 NOTE — Telephone Encounter (Signed)
 Closing request- capsules changed to tablets.

## 2023-08-28 ENCOUNTER — Encounter (HOSPITAL_COMMUNITY): Payer: Self-pay

## 2023-09-01 ENCOUNTER — Telehealth (HOSPITAL_COMMUNITY): Payer: Self-pay | Admitting: *Deleted

## 2023-09-01 NOTE — Telephone Encounter (Signed)
 Attempted to call patient regarding upcoming cardiac PET appointment. Left message on voicemail with name and callback number  Larey Brick RN Navigator Cardiac Imaging [facility] [phone] Office [phone] Cell  Reminder to avoid caffeine and Isordil prior to cardiac PET appt.

## 2023-09-01 NOTE — Telephone Encounter (Signed)
 Reaching out to patient to offer assistance regarding upcoming cardiac imaging study; pt verbalizes understanding of appt date/time, parking situation and where to check in, pre-test NPO status and verified current allergies; name and call back number provided for further questions should they arise  Larey Brick RN Navigator Cardiac Imaging [facility] [phone] office [phone] cell  Patient aware to avoid caffeine and Isosorbide prior to his cardiac PET scan.

## 2023-09-02 ENCOUNTER — Ambulatory Visit (HOSPITAL_COMMUNITY)
Admission: RE | Admit: 2023-09-02 | Discharge: 2023-09-02 | Disposition: A | Discharge status: Home / Self Care | Payer: 59 | Source: Ambulatory Visit | Attending: Cardiology | Admitting: Cardiology

## 2023-09-02 DIAGNOSIS — R072 Precordial pain: Secondary | ICD-10-CM | POA: Insufficient documentation

## 2023-09-02 DIAGNOSIS — I2510 Atherosclerotic heart disease of native coronary artery without angina pectoris: Secondary | ICD-10-CM | POA: Insufficient documentation

## 2023-09-07 ENCOUNTER — Telehealth (HOSPITAL_COMMUNITY): Payer: Self-pay | Admitting: Emergency Medicine

## 2023-09-07 ENCOUNTER — Encounter (HOSPITAL_COMMUNITY): Payer: Self-pay

## 2023-09-07 NOTE — Telephone Encounter (Signed)
 Reaching out to patient to offer assistance regarding upcoming cardiac imaging study; pt verbalizes understanding of appt date/time, parking situation and where to check in, pre-test NPO status and medications ordered, and verified current allergies; name and call back number provided for further questions should they arise Rockwell Alexandria RN Navigator Cardiac Imaging [facility] [phone] office [phone] cell

## 2023-09-08 ENCOUNTER — Ambulatory Visit (HOSPITAL_COMMUNITY)
Admission: RE | Admit: 2023-09-08 | Discharge: 2023-09-08 | Disposition: A | Discharge status: Home / Self Care | Source: Ambulatory Visit | Attending: Cardiology | Admitting: Cardiology

## 2023-09-08 DIAGNOSIS — R072 Precordial pain: Secondary | ICD-10-CM | POA: Insufficient documentation

## 2023-09-08 DIAGNOSIS — I2510 Atherosclerotic heart disease of native coronary artery without angina pectoris: Secondary | ICD-10-CM | POA: Insufficient documentation

## 2023-09-08 LAB — NM PET CT CARDIAC PERFUSION MULTI W/ABSOLUTE BLOODFLOW
MBFR: 2.79
Nuc Rest EF: 52 %
Nuc Stress EF: 52 %
Rest MBF: 0.48 ml/g/min
Rest Nuclear Isotope Dose: 23.5 mCi
ST Depression (mm): 0 mm
Stress MBF: 1.34 ml/g/min
Stress Nuclear Isotope Dose: 23.4 mCi
TID: 1.03

## 2023-09-08 MED ORDER — RUBIDIUM RB82 GENERATOR (RUBYFILL)
23.4300 | PACK | Freq: Once | INTRAVENOUS | Status: AC
  Administered 2023-09-08: 23.43 via INTRAVENOUS

## 2023-09-08 MED ORDER — RUBIDIUM RB82 GENERATOR (RUBYFILL)
23.4500 | PACK | Freq: Once | INTRAVENOUS | Status: AC
Start: 2023-09-08 — End: 2023-09-08
  Administered 2023-09-08: 23.45 via INTRAVENOUS

## 2023-09-08 MED ORDER — REGADENOSON 0.4 MG/5ML IV SOLN
INTRAVENOUS | Status: AC
  Filled 2023-09-08: qty 5

## 2023-09-08 MED ORDER — REGADENOSON 0.4 MG/5ML IV SOLN
0.4000 mg | Freq: Once | INTRAVENOUS | Status: AC
  Administered 2023-09-08: 0.4 mg via INTRAVENOUS

## 2023-09-21 ENCOUNTER — Encounter: Payer: Self-pay | Admitting: Cardiology

## 2023-09-21 ENCOUNTER — Ambulatory Visit: Payer: 59 | Attending: Cardiology | Admitting: Cardiology

## 2023-09-21 VITALS — BP 114/78 | HR 58 | Resp 16 | Ht 72.0 in | Wt 198.8 lb

## 2023-09-21 DIAGNOSIS — R002 Palpitations: Secondary | ICD-10-CM

## 2023-09-21 DIAGNOSIS — E782 Mixed hyperlipidemia: Secondary | ICD-10-CM | POA: Diagnosis not present

## 2023-09-21 DIAGNOSIS — R0609 Other forms of dyspnea: Secondary | ICD-10-CM | POA: Insufficient documentation

## 2023-09-21 DIAGNOSIS — I10 Essential (primary) hypertension: Secondary | ICD-10-CM

## 2023-09-21 DIAGNOSIS — I2081 Angina pectoris with coronary microvascular dysfunction: Secondary | ICD-10-CM

## 2023-09-21 LAB — PRO B NATRIURETIC PEPTIDE: NT-Pro BNP: <36 pg/mL (ref 0–210)

## 2023-09-21 NOTE — Progress Notes (Signed)
 Cardiology Office Note:  .   Date:  09/21/2023  ID:  Thomas Wilkerson, DOB [birth year], MRN [id number] PCP: Henrine Screws, MD  [facility] Providers Cardiologist:  Truett Mainland, MD PCP: Henrine Screws, MD  Chief Complaint  Patient presents with   Coronary artery disease of native artery of native heart wi   Follow-up      History of Present Illness: .    Thomas Wilkerson is a 58 y.o. male with hypertension, hyperlipidemia, coronary artery disease, h/o cardiac arrest in 2012 likely from LAD STEMI, treated with primary PCI, h/o seizures, anxiety, depression, BPH, chronic back pain.   Patient continues to have intermittent episodes of palpitations lasting for 5 to 6 minutes, sometimes waking up from night.  Occasionally, he reports chest tightness associated with the palpitations.  He exercises regularly, has started biking.  He has noticed occasional shortness of breath, but is not exercise limiting.  Reviewed recent PET scan, and monitor results with the patient, details below.   Vitals:   09/21/23 0844  BP: 114/78  Pulse: (!) 58  Resp: 16  SpO2: 97%      ROS:  Review of Systems  Cardiovascular:  Positive for chest pain and palpitations. Negative for dyspnea on exertion, leg swelling and syncope.     Studies Reviewed: [name]        EKG 06/23/2023: Sinus rhythm 53 bpm Normal EKG   PET/CT stress test 08/2023:   Findings are consistent with no ischemia. The study is low risk.   LV perfusion is abnormal. There is no evidence of ischemia. There is no evidence of infarction. Defect 1: There is a small defect with mild reduction in uptake present in the apical anterior location(s) that is fixed. There is normal wall motion in the defect area. Consistent with artifact, cannot exclude very small apical anterior infarct.   Rest left ventricular function is normal. Rest EF: 52%. Stress left ventricular function is normal. Stress EF: 52%. End diastolic cavity size is normal.  End systolic cavity size is severely enlarged.  Consider echocardiogram for correlation.   Myocardial blood flow was computed to be 0.28ml/g/min at rest and 1.25ml/g/min at stress. Global myocardial blood flow reserve was 2.79 and was normal.   Coronary calcium assessment not performed due to prior revascularization. Aortic atherosclerosis noted.  Mobile cardiac outpatient telemetry 15 days 06/23/2023 - 07/25/2023: Dominant rhythm: Sinus. HR 44-120 bpm. Avg HR 63 bpm. No atrial fibrillation/atrial flutter/SVT/VT/high grade AV block, sinus pause >3sec noted. 1% PVC's noted. 75 events transmitted, 61 patient triggered, 14 auto triggered. No significant arrhythmia noted to correlate with patient's symptoms.  Coronary angiography 07/01/2022: LM: Normal LAD: Patent prox LAD stent, no restenosis Lcx: No significant disease RCA: No significant disease   RFR 0.92 (Normal >0.89) FFR 0.87 (Normal >0.80) CFR 2.1 (Normal >2.0) IMR 38 (Normal <25)   No significant epicardial coronary artery disease Elevated IMR suggests microvascular dysfunction   Exercise Sestamibi stress test 05/26/2022: Exercise nuclear stress test was performed using Bruce protocol. Patient reached 10.7 METS, and 86% of age predicted maximum heart rate. Exercise capacity was excellent. Non-limiting chest pain reported. Heart rate and hemodynamic response were normal. Stress EKG revealed no ischemic changes. SPECT images showed very small sized, mild intensity, reversible perfusion defect in apical anterior myocardium. Stress LV EF calculated 42%, but visually appears around 50%. Low risk study. Compared to previous study in 2021, mild ischemia is new.    Independently interpreted 01/2023: Chol 115, TG 196, HDL 33, LDL  50 HbA1C 6.2% Hb 14 Cr 0.9    Physical Exam:   Physical Exam Vitals and nursing note reviewed.  Constitutional:      General: He is not in acute distress. Neck:     Vascular: No JVD.  Cardiovascular:      Rate and Rhythm: Normal rate and regular rhythm.     Heart sounds: Normal heart sounds. No murmur heard. Pulmonary:     Effort: Pulmonary effort is normal.     Breath sounds: Normal breath sounds. No wheezing or rales.  Musculoskeletal:     Right lower leg: No edema.     Left lower leg: No edema.      VISIT DIAGNOSES:   ICD-10-CM   1. Angina pectoris with coronary microvascular dysfunction (HCC)  I20.81     2. Palpitations  R00.2     3. Essential hypertension  I10     4. Mixed hyperlipidemia  E78.2     5. Exertional dyspnea  R06.09 ECHOCARDIOGRAM COMPLETE    Pro b natriuretic peptide (BNP)        ASSESSMENT AND PLAN: .    Thomas Wilkerson is a 58 y.o. male with hypertension, hyperlipidemia, coronary artery disease, h/o cardiac arrest in 2012 likely from LAD STEMI, treated with primary PCI, h/o seizures, anxiety, depression, BPH, chronic back pain.     CAD: Patent LAD stent, no other significant CAD. (Cath 06/2022). Mild microvascular dysfunction (IMR 38). He did not tolerate nebivolol or carvedilol, thus back on metoprolol 50 mg twice daily. Possibly small apical infarct, but no ischemia on PET/CT stress test.   I will try stopping his Imdur to see if that improves symptoms, as nitrates can exacerbate symptoms related to microvascular dysfunction. Continue aspirin 81 mg, amlodipine 5 mg daily, lipitor 80 mg daily, Zetia 10 mg daily.    Palpitations: Recurrent episodes.   No significant arrhythmia noted on mobile cardiac outpatient telemetry in 06/2023. Given the infrequent nature of symptoms, I have encouraged him to use a smart watch or Kardia mobile to identify an arrhythmia.  Exertional dyspnea: End-systolic cavity severely enlarged on PET scan.  Echocardiogram essentially normal in 01/2021.  Hypertension: Controlled   Mixed hyperlipidemia: Well controlled.    F/u in 6 months  Signed, Elder Negus, MD

## 2023-09-21 NOTE — Patient Instructions (Addendum)
 Medication Instructions:  STOP Imdur   *If you need a refill on your cardiac medications before your next appointment, please call your pharmacy*  Lab Work: Probnp   If you have labs (blood work) drawn today and your tests are completely normal, you will receive your results only by: MyChart Message (if you have MyChart) OR A paper copy in the mail If you have any lab test that is abnormal or we need to change your treatment, we will call you to review the results.  Testing/Procedures: Echo  Your physician has requested that you have an echocardiogram. Echocardiography is a painless test that uses sound waves to create images of your heart. It provides your doctor with information about the size and shape of your heart and how well your heart's chambers and valves are working. This procedure takes approximately one hour. There are no restrictions for this procedure. Please do NOT wear cologne, perfume, aftershave, or lotions (deodorant is allowed). Please arrive 15 minutes prior to your appointment time.  Please note: We ask at that you not bring children with you during ultrasound (echo/ vascular) testing. Due to room size and safety concerns, children are not allowed in the ultrasound rooms during exams. Our front office staff cannot provide observation of children in our lobby area while testing is being conducted. An adult accompanying a patient to their appointment will only be allowed in the ultrasound room at the discretion of the ultrasound technician under special circumstances. We apologize for any inconvenience.   Follow-Up: At [facility], you and your health needs are our priority.  As part of our continuing mission to provide you with exceptional heart care, our providers are all part of one team.  This team includes your primary Cardiologist (physician) and Advanced Practice Providers or APPs (Physician Assistants and Nurse Practitioners) who all work together to  provide you with the care you need, when you need it.  Your next appointment:   6 month(s)  Provider:   Elder Negus, MD      Other Instructions      1st Floor: - Lobby - Registration  - Pharmacy  - Lab - Cafe  2nd Floor: - PV Lab - Diagnostic Testing (echo, CT, nuclear med)  3rd Floor: - Vacant  4th Floor: - TCTS (cardiothoracic surgery) - AFib Clinic - Structural Heart Clinic - Vascular Surgery  - Vascular Ultrasound  5th Floor: - HeartCare Cardiology (general and EP) - Clinical Pharmacy for coumadin, hypertension, lipid, weight-loss medications, and med management appointments    Valet parking services will be available as well.

## 2023-09-22 ENCOUNTER — Encounter: Payer: Self-pay | Admitting: Cardiology

## 2023-09-23 ENCOUNTER — Encounter: Payer: Self-pay | Admitting: Gastroenterology

## 2023-10-08 ENCOUNTER — Ambulatory Visit (INDEPENDENT_AMBULATORY_CARE_PROVIDER_SITE_OTHER): Admitting: Gastroenterology

## 2023-10-08 ENCOUNTER — Encounter: Payer: Self-pay | Admitting: Gastroenterology

## 2023-10-08 VITALS — BP 120/68 | HR 55 | Ht 72.0 in | Wt 200.0 lb

## 2023-10-08 DIAGNOSIS — R112 Nausea with vomiting, unspecified: Secondary | ICD-10-CM

## 2023-10-08 DIAGNOSIS — K588 Other irritable bowel syndrome: Secondary | ICD-10-CM

## 2023-10-08 DIAGNOSIS — R109 Unspecified abdominal pain: Secondary | ICD-10-CM | POA: Diagnosis not present

## 2023-10-08 DIAGNOSIS — K5900 Constipation, unspecified: Secondary | ICD-10-CM

## 2023-10-08 NOTE — Patient Instructions (Signed)
 You have been scheduled for an endoscopy. Please follow written instructions given to you at your visit today.  If you use inhalers (even only as needed), please bring them with you on the day of your procedure.  If you take any of the following medications, they will need to be adjusted prior to your procedure:   DO NOT TAKE 7 DAYS PRIOR TO TEST- Trulicity (dulaglutide) Ozempic, Wegovy (semaglutide) Mounjaro (tirzepatide) Bydureon Bcise (exanatide extended release)  DO NOT TAKE 1 DAY PRIOR TO YOUR TEST Rybelsus (semaglutide) Adlyxin (lixisenatide) Victoza (liraglutide) Byetta (exanatide) ___________________________________________________________________________   Start Metamucil daily.  _______________________________________________________  If your blood pressure at your visit was 140/90 or greater, please contact your primary care physician to follow up on this.  _______________________________________________________  If you are age 66 or older, your body mass index should be between 23-30. Your Body mass index is 27.12 kg/m. If this is out of the aforementioned range listed, please consider follow up with your Primary Care Provider.  If you are age 75 or younger, your body mass index should be between 19-25. Your Body mass index is 27.12 kg/m. If this is out of the aformentioned range listed, please consider follow up with your Primary Care Provider.   ________________________________________________________  The [facility] providers would like to encourage you to use MYCHART to communicate with providers for non-urgent requests or questions.  Due to long hold times on the telephone, sending your provider a message by [facility] may be a faster and more efficient way to get a response.  Please allow 48 business hours for a response.  Please remember that this is for non-urgent requests.  _______________________________________________________   It was a pleasure to see you  today!  Thank you for trusting me with your gastrointestinal care!    Scott E.Cherryl Corona, MD

## 2023-10-08 NOTE — Progress Notes (Signed)
 Discussed the use of AI scribe software for clinical note transcription with the patient, who gave verbal consent to proceed.  HPI : Thomas Wilkerson is a 58 y.o. male with a history of coronary artery disease, hypertension and hyperlipidemia who presents for follow-up of chronic abdominal pain.   I initially met him in November 2024 for a direct colon polyp surveillance colonoscopy.  The colonoscopy was notable for incidental circumferential inflammatory changes in the splenic flexure.  Endoscopic findings were suspicious for ischemic colitis, and biopsies were also suggestive of this.  A small hyperplastic polyp was also removed.  He continued to have ongoing issues with intermittent left lower quadrant pain, which prompted a CTA in January which was unremarkable.  There is no evidence of colon wall thickening or inflammation on that CT scan. He followed up with cardiology because of ongoing issues with chest pain and palpitations.  A PET/cardiac perfusion scan were ordered and a cardiac monitor was ordered given his palpitations and recent ischemic colitis.  The PET scan showed no signs of ischemia.  Cardiac monitor showed no evidence of atrial fibrillation or other significant arrhythmias. At his last office visit, it was felt that his pain may be more abdominal wall/musculoskeletal in etiology, based on his reports of exacerbating factors as well as positive Carnett's sign.  He was prescribed a course of meloxicam  and omeprazole .  Today, he states that the Mobic  did not help his pain.  He continues to have bothersome abdominal pain as well as nausea. He has also been having more problems with his bowel habits, characterized by periods of constipation, resulting in worsening abdominal pain, followed by multiple episodes of loose stools. The pain continues to be localized to the left side, and continues to be exacerbated by physical activity such as lifting.  He is describing it as a tugging  sensation.  While the pain is exacerbated by physical activity, he also states that it is exacerbated by constipation as well as eating large meals and certain foods such as red sauces.  He takes Senna every morning to maintain regular bowel movements. Despite this, he continues to experience the described pattern of constipation and diarrhea. He has not tried fiber supplements but attempts to consume a diet rich in vegetables. He experiences gas, particularly in the mornings, which he wonders might be related to his CPAP use.  He has been taking omeprazole  intermittently, primarily when he anticipates symptoms, due to concerns about long-term use. His wife expressed concerns about potential side effects of prolonged use.     LHC Jan 2024 LM: Normal LAD: Patent prox LAD stent, no restenosis Lcx: No significant disease RCA: No significant disease   RFR 0.92 (Normal >0.89) FFR 0.87 (Normal >0.80) CFR 2.1 (Normal >2.0) IMR 38 (Normal <25)   No significant epicardial coronary artery disease Elevated IMR suggests microvascular dysfunction     Colonoscopy May 07, 2023 - One 5 mm polyp in the sigmoid colon, removed with a cold snare. Resected and retrieved.  - Localized moderate inflammation was found at the splenic flexure secondary to ischemic colitis. Biopsied.  - The examined portion of the ileum was normal.  - The distal rectum and anal verge are normal on retroflexion view - Repeat colonoscopy recommended in 7 years   FINAL DIAGNOSIS       1. Surgical [P], colon, splenic flexure :      - BENIGN COLONIC MUCOSA WITH ISCHEMIC PATTERN COLITIS.SEE NOTE.       2. Surgical [  P], colon, sigmoid, polyp (1) :      - HYPERPLASTIC POLYP.      - NO DYSPLASIA OR MALIGNANCY.       Diagnosis Note : The colon biopsy shows colonic mucosa with hemorrhage within      the lamina propria.The crypts demonstrate superficial withering and regenerative      changes.The features are those of an  ischemic-pattern colitis.While these      findings are most common in ischemic colitis, the differential diagnosis is      broad and includes mechanical obstruction (e.g.stercoral ulcer), infections,      vasculitis, and medication effect.Clinical and  correlation is recommended.      CT-A abdomen/pelvis Jun 27, 2023 IMPRESSION: VASCULAR   1. No significant stenosis or occlusion of the visceral arteries to suggest a source for mesenteric ischemia. 2. Trace atherosclerotic calcifications along the aorta. Aortic Atherosclerosis (ICD10-I70.0)  NM PET CT cardiac perfusion Sep 08, 2023   Findings are consistent with no ischemia. The study is low risk.   LV perfusion is abnormal. There is no evidence of ischemia. There is no evidence of infarction. Defect 1: There is a small defect with mild reduction in uptake present in the apical anterior location(s) that is fixed. There is normal wall motion in the defect area. Consistent with artifact, cannot exclude very small apical anterior infarct.   Rest left ventricular function is normal. Rest EF: 52%. Stress left ventricular function is normal. Stress EF: 52%. End diastolic cavity size is normal. End systolic cavity size is severely enlarged.  Consider echocardiogram for correlation.   Myocardial blood flow was computed to be 0.36ml/g/min at rest and 1.65ml/g/min at stress. Global myocardial blood flow reserve was 2.79 and was normal.   Coronary calcium  assessment not performed due to prior revascularization. Aortic atherosclerosis noted.   Electronically Signed  By: Gloriann Larger M.D  Past Medical History:  Diagnosis Date   Cardiac arrest [facility]) 2012   Coronary artery disease with stable angina pectoris (HCC) 01/25/2019   GERD (gastroesophageal reflux disease) 01/25/2019   HLD (hyperlipidemia) 01/25/2019   HTN (hypertension) 01/25/2019   Ischemic colitis (HCC)    Seizure disorder (HCC) 01/25/2019     Past Surgical History:  Procedure  Laterality Date   ANKLE SURGERY Right    COLONOSCOPY     CORONARY PRESSURE/FFR STUDY N/A 07/01/2022   Procedure: INTRAVASCULAR PRESSURE WIRE/FFR STUDY;  Surgeon: Cody Das, MD;  Location: [facility] INVASIVE CV [facility];  Service: Cardiovascular;  Laterality: N/A;   ELBOW SURGERY Left    LEFT HEART CATH AND CORONARY ANGIOGRAPHY N/A 05/22/2020   Procedure: LEFT HEART CATH AND CORONARY ANGIOGRAPHY;  Surgeon: Cody Das, MD;  Location: [facility] INVASIVE CV LAB;  Service: Cardiovascular;  Laterality: N/A;   LEFT HEART CATH AND CORONARY ANGIOGRAPHY N/A 07/01/2022   Procedure: LEFT HEART CATH AND CORONARY ANGIOGRAPHY;  Surgeon: Cody Das, MD;  Location: [facility] INVASIVE CV LAB;  Service: Cardiovascular;  Laterality: N/A;   STENT PLACEMENT VASCULAR ([facility] HX)     Family History  Problem Relation Age of Onset   Colon polyps Mother    Diabetes Mother    Hypertension Mother    Kidney disease Father    Colon cancer Neg Hx    Esophageal cancer Neg Hx    Stomach cancer Neg Hx    Rectal cancer Neg Hx    Social History   Tobacco Use   Smoking status: Former    Current packs/day: 0.00  Average packs/day: 1 pack/day for 20.0 years (20.0 ttl pk-yrs)    Types: Cigarettes, Cigars    Start date: 68    Quit date: 2012    Years since quitting: 13.3   Smokeless tobacco: Never   Tobacco comments:    Smokes occasional cigars  Vaping Use   Vaping status: Never Used  Substance Use Topics   Alcohol use: Yes    Comment: occ   Drug use: Never   Current Outpatient Medications  Medication Sig Dispense Refill   amLODipine  (NORVASC ) 5 MG tablet Take 1 tablet (5 mg total) by mouth daily. 90 tablet 3   aspirin  EC 81 MG tablet Take 1 tablet (81 mg total) by mouth daily. Swallow whole.     atorvastatin  (LIPITOR) 40 MG tablet Take 1 tablet (40 mg total) by mouth daily. 90 tablet 3   Coenzyme Q10 (CO Q10) 100 MG CAPS Take 100 mg by mouth daily.      cyclobenzaprine (FLEXERIL) 10 MG tablet Take 10  mg by mouth 3 (three) times daily as needed for muscle spasms.      docusate sodium (COLACE) 100 MG capsule Take 100 mg by mouth 2 (two) times daily.     DULoxetine (CYMBALTA) 30 MG capsule Take 30 mg by mouth daily.     escitalopram (LEXAPRO) 10 MG tablet Take 10 mg by mouth daily.     ezetimibe  (ZETIA ) 10 MG tablet Take 1 tablet (10 mg total) by mouth daily. 90 tablet 3   fluticasone (FLONASE) 50 MCG/ACT nasal spray Place 2 sprays into both nostrils daily as needed for allergies.      gabapentin (NEURONTIN) 300 MG capsule Take 300-600 mg by mouth See admin instructions. Take 300 mg in the morning and 600 mg at bedtime     ibuprofen (ADVIL) 200 MG tablet Take 600-800 mg by mouth every 6 (six) hours as needed for moderate pain.     lisinopril  (ZESTRIL ) 5 MG tablet Take 1 tablet (5 mg total) by mouth daily. 90 tablet 3   metoprolol  tartrate (LOPRESSOR ) 50 MG tablet Take 1 tablet (50 mg total) by mouth 2 (two) times daily. 180 tablet 3   nitroGLYCERIN  (NITROSTAT ) 0.4 MG SL tablet Place 0.4 mg under the tongue every 5 (five) minutes as needed for chest pain.     Omega-3 Fatty Acids (FISH OIL) 1000 MG CAPS Take 1,000 mg by mouth daily.     omeprazole  (PRILOSEC) 20 MG capsule Take 1 capsule (20 mg total) by mouth daily. 30 capsule 0   Tetrahydrozoline HCl (VISINE OP) Place 1 drop into both eyes daily as needed (dry eyes).     No current facility-administered medications for this visit.   Allergies  Allergen Reactions   Acetaminophen  Itching and Rash    Other Reaction(s): tingling, itching, rash   Latex Itching, Other (See Comments) and Rash    Hands peel (happened w/ latex gloves)    Oxycodone-Acetaminophen  Hives and Itching    Other Reaction(s): tingling, itching, rash   Codeine     Other Reaction(s): tingling, itching , rash   Tape Dermatitis   Carvacrol Rash     Review of Systems: All systems reviewed and negative except where noted in HPI.    NM PET CT CARDIAC PERFUSION MULTI  W/ABSOLUTE BLOODFLOW Result Date: 09/08/2023   Findings are consistent with no ischemia. The study is low risk.   LV perfusion is abnormal. There is no evidence of ischemia. There is no evidence of infarction. Defect  1: There is a small defect with mild reduction in uptake present in the apical anterior location(s) that is fixed. There is normal wall motion in the defect area. Consistent with artifact, cannot exclude very small apical anterior infarct.   Rest left ventricular function is normal. Rest EF: 52%. Stress left ventricular function is normal. Stress EF: 52%. End diastolic cavity size is normal. End systolic cavity size is severely enlarged.  Consider echocardiogram for correlation.   Myocardial blood flow was computed to be 0.38ml/g/min at rest and 1.63ml/g/min at stress. Global myocardial blood flow reserve was 2.79 and was normal.   Coronary calcium  assessment not performed due to prior revascularization. Aortic atherosclerosis noted.   Electronically Signed  By: Gloriann Larger M.D. CLINICAL DATA:  This over-read does not include interpretation of cardiac or coronary anatomy or pathology. No interpretation the PET data set. The cardiac PET-CT interpretation by the cardiologist is attached. COMPARISON:  None Available. FINDINGS: Limited view of the lung parenchyma demonstrates no suspicious nodularity. Airways are normal. Limited view of the mediastinum demonstrates no adenopathy. Esophagus normal. Limited view of the upper abdomen unremarkable. Limited view of the skeleton and chest wall is unremarkable. IMPRESSION: No significant extracardiac findings. Electronically Signed   By: Deboraha Fallow M.D.   On: 09/08/2023 14:49   Physical Exam: BP 120/68   Pulse (!) 55   Ht 6' (1.829 m)   Wt 200 lb (90.7 kg)   BMI 27.12 kg/m  Constitutional: Pleasant,well-developed, Caucasian male in no acute distress. HEENT: Normocephalic and atraumatic. Conjunctivae are normal. No scleral icterus. Neck  supple.  Cardiovascular: Normal rate, regular rhythm.  Pulmonary/chest: Effort normal and breath sounds normal. No wheezing, rales or rhonchi. Abdominal: Soft, nondistended, focal area of tenderness in the left hemiabdomen.  Carnett's sign equivocal.  No hernia appreciated.  Diastases recti noted, nontender.. Bowel sounds active throughout. There are no masses palpable. No hepatomegaly. Extremities: no edema Neurological: Alert and oriented to person place and time. Skin: Skin is warm and dry. No rashes noted. Psychiatric: Normal mood and affect. Behavior is normal.  CBC    Component Value Date/Time   WBC 7.9 06/30/2022 1131   RBC 4.47 06/30/2022 1131   HGB 14.6 06/30/2022 1131   HCT 43.4 06/30/2022 1131   PLT 328 06/30/2022 1131   MCV 97 06/30/2022 1131   MCH 32.7 06/30/2022 1131   MCHC 33.6 06/30/2022 1131   RDW 12.3 06/30/2022 1131    CMP     Component Value Date/Time   NA 142 06/30/2022 1131   K 4.5 06/30/2022 1131   CL 105 06/30/2022 1131   CO2 23 06/30/2022 1131   GLUCOSE 103 (H) 06/30/2022 1131   BUN 12 06/30/2022 1131   CREATININE 0.94 06/30/2022 1131   CALCIUM  10.0 06/30/2022 1131   GFRNONAA 77 05/18/2020 0829   GFRAA 89 05/18/2020 0829       Latest Ref Rng & Units 06/30/2022   11:31 AM 05/18/2020    8:29 AM  CBC EXTENDED  WBC 3.4 - 10.8 x10E3/uL 7.9  6.7   RBC 4.14 - 5.80 x10E6/uL 4.47  4.75   Hemoglobin 13.0 - 17.7 g/dL 40.9  81.1   HCT 91.4 - 51.0 % 43.4  46.0   Platelets 150 - 450 x10E3/uL 328  329       ASSESSMENT AND PLAN:  58 year old male with persistent left-sided abdominal pain.  Initially there was no other GI symptoms, but the patient has developed issues with constipation, diarrhea as well as  nausea.  Previous examination was suggestive of abdominal wall pain syndrome and subsequent trial of meloxicam  was ineffective.  Repeat examination today with equivocal Carnett's sign.  Low suspicion that abdominal wall injection would be helpful,  especially with patient's new bowel habit derangements and ongoing nausea.  Symptoms seem to be getting more consistent with irritable bowel syndrome.  Abdominal pain, nausea Although the location of the pain seems unlikely to be upper GI in etiology, with the patient's ongoing nausea and occasional vomiting, I think an upper endoscopy to exclude etiologies such as peptic ulcer disease and H. pylori infection is reasonable.   -Schedule EGD   Irritable Bowel Syndrome Intermittent constipation and diarrhea with left-sided abdominal pain, exacerbated by prolonged periods without bowel movements and dietary triggers. The clinical presentation aligns with IBS, characterized by abdominal pain, discomfort, bloating, gas, and altered bowel habits. Previous colonoscopy showed ischemic colitis, but subsequent imaging indicated resolution. Differential diagnosis includes IBS and possible residual effects of ischemic colitis. - Recommend daily Metamucil (psyllium) to optimize stool consistency and regularity. - Continue Senna as needed, with potential reduction in frequency with regular Metamucil use. - Consider repeat colonoscopy if symptoms persist to ensure resolution of previous noted inflammation.  Ischemic Colitis Previous ischemic colitis resolved on imaging. Current symptoms do not suggest active ischemic colitis, but a repeat colonoscopy may be considered if symptoms do not improve to ensure complete resolution. - Consider repeat colonoscopy if symptoms persist to confirm resolution of ischemic colitis.      Ruven Coy, MD

## 2023-10-09 ENCOUNTER — Encounter: Payer: Self-pay | Admitting: Gastroenterology

## 2023-10-16 ENCOUNTER — Ambulatory Visit (AMBULATORY_SURGERY_CENTER): Admitting: Gastroenterology

## 2023-10-16 ENCOUNTER — Encounter: Payer: Self-pay | Admitting: Gastroenterology

## 2023-10-16 VITALS — BP 114/62 | HR 48 | Temp 97.9°F | Resp 22 | Ht 72.0 in | Wt 200.0 lb

## 2023-10-16 DIAGNOSIS — K3189 Other diseases of stomach and duodenum: Secondary | ICD-10-CM

## 2023-10-16 DIAGNOSIS — K2100 Gastro-esophageal reflux disease with esophagitis, without bleeding: Secondary | ICD-10-CM

## 2023-10-16 DIAGNOSIS — R109 Unspecified abdominal pain: Secondary | ICD-10-CM

## 2023-10-16 DIAGNOSIS — R112 Nausea with vomiting, unspecified: Secondary | ICD-10-CM

## 2023-10-16 DIAGNOSIS — K219 Gastro-esophageal reflux disease without esophagitis: Secondary | ICD-10-CM

## 2023-10-16 MED ORDER — SODIUM CHLORIDE 0.9 % IV SOLN: 500.0000 mL | Freq: Once | INTRAVENOUS | Status: DC

## 2023-10-16 MED ORDER — OMEPRAZOLE 20 MG PO CPDR: 20.0000 mg | DELAYED_RELEASE_CAPSULE | Freq: Every day | ORAL | 0 refills | Status: DC

## 2023-10-16 NOTE — Patient Instructions (Addendum)
 Resume previous diet Continue present medications Pick up rx for omeprazole , take daily for one month Minimize use of  NSAIDS (Non-Steroidal anti-inflammatory drugs).  (These include, aspirin , aspirin -containing products(products containing salicylic acid like Pepto Bismol and Alka Seltzer), ibuprofen, advil, motrin, naproxen, aleve, goody powders, etc)  Await pathology results  Handouts/information given for GERD and esophagitis  YOU HAD AN ENDOSCOPIC PROCEDURE TODAY AT THE [facility]:   Refer to the procedure report that was given to you for any specific questions about what was found during the examination.  If the procedure report does not answer your questions, please call your gastroenterologist to clarify.  If you requested that your care partner not be given the details of your procedure findings, then the procedure report has been included in a sealed envelope for you to review at your convenience later.  YOU SHOULD EXPECT: Some feelings of bloating in the abdomen. Passage of more gas than usual.  Walking can help get rid of the air that was put into your GI tract during the procedure and reduce the bloating. If you had a lower endoscopy (such as a colonoscopy or flexible sigmoidoscopy) you may notice spotting of blood in your stool or on the toilet paper. If you underwent a bowel prep for your procedure, you may not have a normal bowel movement for a few days.  Please Note:  You might notice some irritation and congestion in your nose or some drainage.  This is from the oxygen used during your procedure.  There is no need for concern and it should clear up in a day or so.  SYMPTOMS TO REPORT IMMEDIATELY:  Following upper endoscopy (EGD)  Vomiting of blood or coffee ground material  New chest pain or pain under the shoulder blades  Painful or persistently difficult swallowing  New shortness of breath  Fever of 100F or higher  Black, tarry-looking stools For urgent or  emergent issues, a gastroenterologist can be reached at any hour by calling [phone]. Do not use MyChart messaging for urgent concerns.   DIET:  We do recommend a small meal at first, but then you may proceed to your regular diet.  Drink plenty of fluids but you should avoid alcoholic beverages for 24 hours.  ACTIVITY:  You should plan to take it easy for the rest of today and you should NOT DRIVE or use heavy machinery until tomorrow (because of the sedation medicines used during the test).    FOLLOW UP: Our staff will call the number listed on your records the next business day following your procedure.  We will call around 7:15- 8:00 am to check on you and address any questions or concerns that you may have regarding the information given to you following your procedure. If we do not reach you, we will leave a message.     If any biopsies were taken you will be contacted by phone or by letter within the next 1-3 weeks.  Please call us  at [phone] if you have not heard about the biopsies in 3 weeks.    SIGNATURES/CONFIDENTIALITY: You and/or your care partner have signed paperwork which will be entered into your electronic medical record.  These signatures attest to the fact that that the information above on your After Visit Summary has been reviewed and is understood.  Full responsibility of the confidentiality of this discharge information lies with you and/or your care-partner.

## 2023-10-16 NOTE — Op Note (Signed)
 [facility] Patient Name: Thomas Wilkerson Procedure Date: 10/16/2023 3:44 PM MRN: [id number] Endoscopist: Geralyn Knee E. Cherryl Corona , MD, [id number] Age: 58 Referring MD:  Date of Birth: [birth year] Gender: Male Account #: [id number] Procedure:                Upper GI endoscopy Indications:              Abdominal pain in the left upper quadrant Medicines:                Monitored Anesthesia Care Procedure:                Pre-Anesthesia Assessment:                           - Prior to the procedure, a History and Physical                            was performed, and patient medications and                            allergies were reviewed. The patient's tolerance of                            previous anesthesia was also reviewed. The risks                            and benefits of the procedure and the sedation                            options and risks were discussed with the patient.                            All questions were answered, and informed consent                            was obtained. Prior Anticoagulants: The patient has                            taken no anticoagulant or antiplatelet agents. ASA                            Grade Assessment: III - A patient with severe                            systemic disease. After reviewing the risks and                            benefits, the patient was deemed in satisfactory                            condition to undergo the procedure.                           After obtaining informed consent, the endoscope was  passed under direct vision. Throughout the                            procedure, the patient's blood pressure, pulse, and                            oxygen saturations were monitored continuously. The                            Olympus Scope [id number] was introduced through the                            mouth, and advanced to the second part of duodenum.                             The upper GI endoscopy was accomplished without                            difficulty. The patient tolerated the procedure                            well. Scope In: Scope Out: Findings:                 The examined portions of the nasopharynx,                            oropharynx and larynx were normal.                           LA Grade A (one or more mucosal breaks less than 5                            mm, not extending between tops of 2 mucosal folds)                            esophagitis was found.                           The exam of the esophagus was otherwise normal.                           A single 3 mm erosion was found in the gastric                            body. Biopsies were taken with a cold forceps for                            histology. Estimated blood loss was minimal.                           The exam of the stomach was otherwise normal.  Striped mildly erythematous mucosa was found in the                            gastric antrum. Biopsies were taken with a cold                            forceps for Helicobacter pylori testing. Estimated                            blood loss was minimal.                           The examined duodenum was normal. Biopsies for                            histology were taken with a cold forceps for                            evaluation of celiac disease. Estimated blood loss                            was minimal. Complications:            No immediate complications. Estimated Blood Loss:     Estimated blood loss was minimal. Impression:               - The examined portions of the nasopharynx,                            oropharynx and larynx were normal.                           - LA Grade A reflux esophagitis.                           - Erosive gastropathy. Biopsied.                           - Erythematous mucosa in the antrum. Biopsied.                           - Normal examined duodenum.  Biopsied.                           - No obvious etiologies for patient's abdominal                            pain. Recommendation:           - Patient has a contact number available for                            emergencies. The signs and symptoms of potential                            delayed complications were discussed with the  patient. Return to normal activities tomorrow.                            Written discharge instructions were provided to the                            patient.                           - Resume previous diet.                           - Continue present medications.                           - Await pathology results.                           - Use Prilosec (omeprazole ) 20 mg PO daily for 4                            weeks to heal esophagitis and gastric erosion. Raeana Blinn E. Cherryl Corona, MD 10/16/2023 4:25:36 PM This report has been signed electronically.

## 2023-10-16 NOTE — Progress Notes (Unsigned)
 Pt's states no medical or surgical changes since previsit or office visit.

## 2023-10-16 NOTE — Progress Notes (Unsigned)
 History and Physical Interval Note:  10/16/2023 3:53 PM  Cory Dingwall  has presented today for endoscopic procedure(s), with the diagnosis of  Encounter Diagnoses  Name Primary?   Gastroesophageal reflux disease, unspecified whether esophagitis present Yes   Nausea and vomiting, unspecified vomiting type    Abdominal pain, unspecified abdominal location   .  The various methods of evaluation and treatment have been discussed with the patient and/or family. After consideration of risks, benefits and other options for treatment, the patient has consented to  the endoscopic procedure(s).   The patient's history has been reviewed, patient examined, no change in status, stable for endoscopic procedure(s).  I have reviewed the patient's chart and labs.  Questions were answered to the patient's satisfaction.     Aliegha Paullin E. Cherryl Corona, MD [facility]

## 2023-10-16 NOTE — Progress Notes (Signed)
 Called to room to assist during endoscopic procedure.  Patient ID and intended procedure confirmed with present staff. Received instructions for my participation in the procedure from the performing physician.

## 2023-10-16 NOTE — Progress Notes (Unsigned)
 Sedate, gd SR, tolerated procedure well, VSS, report to RN

## 2023-10-19 ENCOUNTER — Telehealth: Payer: Self-pay | Admitting: *Deleted

## 2023-10-19 NOTE — Telephone Encounter (Signed)
  Follow up Call-     10/16/2023    2:53 PM 05/07/2023    7:44 AM  Call back number  Post procedure Call Back phone  # [phone]  Permission to leave phone message Yes Yes     Patient questions:  Do you have a fever, pain , or abdominal swelling? No. Pain Score  0 *  Have you tolerated food without any problems? Yes.    Have you been able to return to your normal activities? Yes.    Do you have any questions about your discharge instructions: Diet   No. Medications  No. Follow up visit  No.  Do you have questions or concerns about your Care? No.  Actions: * If pain score is 4 or above: No action needed, pain <4.

## 2023-10-21 LAB — SURGICAL PATHOLOGY

## 2023-10-22 ENCOUNTER — Ambulatory Visit (HOSPITAL_COMMUNITY): Attending: Cardiology

## 2023-10-22 DIAGNOSIS — R0609 Other forms of dyspnea: Secondary | ICD-10-CM | POA: Diagnosis present

## 2023-10-22 LAB — ECHOCARDIOGRAM COMPLETE
Area-P 1/2: 3.53 cm2
S' Lateral: 3.9 cm

## 2023-10-23 ENCOUNTER — Encounter: Payer: Self-pay | Admitting: Cardiology

## 2023-10-26 ENCOUNTER — Encounter: Payer: Self-pay | Admitting: Gastroenterology

## 2023-10-26 NOTE — Progress Notes (Signed)
 Thomas Wilkerson,  The biopsies taken from you duodenum were unremarkable, with no evidence of celiac disease The biopsies taken from your stomach were notable for mild reactive gastropathy which is a common finding and often related to use of certain medications (usually NSAIDs), but there was no evidence of Helicobacter pylori infection. This common finding is not felt to necessarily be a cause of any particular symptom and there is no specific treatment or further evaluation recommended.  Please take the omeprazole  for a month and see if there is any improvement in your symptoms.  If not, we can consider adding a TCA such as Elavil to help with your chronic pain.  As you are already on Lexapro and Cymbalta, I would want to discuss this with your provider managing these medications first.  We could also refer you to pain management to see if they may have other suggestions.  Please update us  with how your symptoms are doing in another 2-3 weeks and we can consider the next step in helping with your pain.

## 2023-11-11 ENCOUNTER — Other Ambulatory Visit: Payer: Self-pay | Admitting: Gastroenterology

## 2024-02-25 ENCOUNTER — Ambulatory Visit (INDEPENDENT_AMBULATORY_CARE_PROVIDER_SITE_OTHER): Admitting: Podiatry

## 2024-02-25 ENCOUNTER — Encounter: Payer: Self-pay | Admitting: Podiatry

## 2024-02-25 ENCOUNTER — Telehealth: Payer: Self-pay | Admitting: *Deleted

## 2024-02-25 DIAGNOSIS — M778 Other enthesopathies, not elsewhere classified: Secondary | ICD-10-CM | POA: Diagnosis not present

## 2024-02-25 MED ORDER — MELOXICAM 15 MG PO TABS: 15.0000 mg | ORAL_TABLET | Freq: Every day | ORAL | 0 refills | Status: DC

## 2024-02-25 NOTE — Progress Notes (Signed)
  Subjective:  Patient ID: Thomas Wilkerson, male    DOB: 1965/09/03,   MRN: [id number]  Chief Complaint  Patient presents with   Foot Pain    It's my right foot.  It hurts here.  I had xrays at [facility] and they said it's not a problem with the bone.  (1st met) Patient refused xrays    58 y.o. male presents for concern as above. Patient relates this has been ongoing for a while. Pain when flexing his toe and with first steps after rest. He has tried multiple different shoes to help . Denies any other pedal complaints. Denies n/v/f/c.   Past Medical History:  Diagnosis Date   Cardiac arrest [facility]) 2012   Coronary artery disease with stable angina pectoris (HCC) 01/25/2019   GERD (gastroesophageal reflux disease) 01/25/2019   HLD (hyperlipidemia) 01/25/2019   HTN (hypertension) 01/25/2019   Ischemic colitis (HCC)    Seizure disorder (HCC) 01/25/2019    Objective:  Physical Exam: Vascular: DP/PT pulses 2/4 bilateral. CFT <3 seconds. Normal hair growth on digits. No edema.  Skin. No lacerations or abrasions bilateral feet.  Musculoskeletal: MMT 5/5 bilateral lower extremities in DF, PF, Inversion and Eversion. Deceased ROM in DF of ankle joint. Tender to the plantar first metatarsal some in the area of hte metatarsal and proximaly along the plantar fascia.  Neurological: Sensation intact to light touch.   Assessment:   1. Capsulitis of right foot      Plan:  Patient was evaluated and treated and all questions answered. Discussed plantar fasciitis with patient.  [place] refused Discussed treatment options including, ice, NSAIDS, supportive shoes, bracing, and stretching. Stretching exercises provided to be done on a daily basis.   Prescription for meloxicam  provided and sent to pharmacy. Dancer pads provided.   Follow-up 6 weeks or sooner if any problems arise. In the meantime, encouraged to call the office with any questions, concerns, change in symptoms.    Asberry Failing, DPM

## 2024-02-25 NOTE — Patient Instructions (Signed)

## 2024-02-25 NOTE — Telephone Encounter (Signed)
 I attempted to call the patient to ask him to come in about 20 minutes prior to his appointment because we need him to get xrays of his right foot.

## 2024-03-09 ENCOUNTER — Other Ambulatory Visit: Payer: Self-pay | Admitting: Gastroenterology

## 2024-03-24 ENCOUNTER — Other Ambulatory Visit: Payer: Self-pay | Admitting: Podiatry

## 2024-03-24 ENCOUNTER — Other Ambulatory Visit: Payer: Self-pay | Admitting: Gastroenterology

## 2024-04-19 ENCOUNTER — Other Ambulatory Visit: Payer: Self-pay | Admitting: Podiatry

## 2024-04-21 ENCOUNTER — Other Ambulatory Visit: Payer: Self-pay | Admitting: Podiatry

## 2024-04-21 ENCOUNTER — Ambulatory Visit (INDEPENDENT_AMBULATORY_CARE_PROVIDER_SITE_OTHER): Admitting: Podiatry

## 2024-04-21 DIAGNOSIS — Z91199 Patient's noncompliance with other medical treatment and regimen due to unspecified reason: Secondary | ICD-10-CM

## 2024-04-21 NOTE — Progress Notes (Signed)
 No show

## 2024-07-03 ENCOUNTER — Other Ambulatory Visit: Payer: Self-pay | Admitting: Cardiology
